# Patient Record
Sex: Male | Born: 1960
Health system: Southern US, Community
[De-identification: ages and names within clinical notes are randomized; demographics above are authoritative.]

## PROBLEM LIST (undated history)

## (undated) DIAGNOSIS — Z9889 Other specified postprocedural states: Secondary | ICD-10-CM

## (undated) DIAGNOSIS — N62 Hypertrophy of breast: Secondary | ICD-10-CM

## (undated) DIAGNOSIS — E229 Hyperfunction of pituitary gland, unspecified: Secondary | ICD-10-CM

## (undated) DIAGNOSIS — R112 Nausea with vomiting, unspecified: Secondary | ICD-10-CM

## (undated) DIAGNOSIS — E785 Hyperlipidemia, unspecified: Secondary | ICD-10-CM

## (undated) DIAGNOSIS — K219 Gastro-esophageal reflux disease without esophagitis: Secondary | ICD-10-CM

## (undated) DIAGNOSIS — E890 Postprocedural hypothyroidism: Secondary | ICD-10-CM

## (undated) DIAGNOSIS — F411 Generalized anxiety disorder: Secondary | ICD-10-CM

## (undated) DIAGNOSIS — I351 Nonrheumatic aortic (valve) insufficiency: Secondary | ICD-10-CM

## (undated) HISTORY — PX: INGUINAL HERNIA REPAIR: SHX194

## (undated) HISTORY — DX: Gastro-esophageal reflux disease without esophagitis: K21.9

## (undated) HISTORY — DX: Hypertrophy of breast: N62

## (undated) HISTORY — DX: Hyperfunction of pituitary gland, unspecified: E22.9

## (undated) HISTORY — PX: HEMORRHOID SURGERY: SHX153

## (undated) HISTORY — DX: Postprocedural hypothyroidism: E89.0

## (undated) HISTORY — DX: Generalized anxiety disorder: F41.1

## (undated) HISTORY — DX: Hyperlipidemia, unspecified: E78.5

---

## 2003-11-01 ENCOUNTER — Ambulatory Visit (HOSPITAL_COMMUNITY): Admission: RE | Admit: 2003-11-01 | Discharge: 2003-11-01 | Payer: Self-pay | Admitting: Internal Medicine

## 2004-05-02 ENCOUNTER — Ambulatory Visit: Payer: Self-pay | Admitting: Internal Medicine

## 2004-11-06 ENCOUNTER — Ambulatory Visit: Payer: Self-pay | Admitting: Internal Medicine

## 2006-03-25 ENCOUNTER — Ambulatory Visit: Payer: Self-pay | Admitting: Internal Medicine

## 2007-11-24 ENCOUNTER — Ambulatory Visit: Payer: Self-pay | Admitting: Internal Medicine

## 2007-11-24 DIAGNOSIS — K219 Gastro-esophageal reflux disease without esophagitis: Secondary | ICD-10-CM

## 2007-11-24 DIAGNOSIS — E785 Hyperlipidemia, unspecified: Secondary | ICD-10-CM | POA: Insufficient documentation

## 2007-11-24 DIAGNOSIS — F411 Generalized anxiety disorder: Secondary | ICD-10-CM | POA: Insufficient documentation

## 2007-11-24 DIAGNOSIS — H6120 Impacted cerumen, unspecified ear: Secondary | ICD-10-CM | POA: Insufficient documentation

## 2007-11-24 HISTORY — DX: Generalized anxiety disorder: F41.1

## 2007-11-24 HISTORY — DX: Gastro-esophageal reflux disease without esophagitis: K21.9

## 2007-11-24 HISTORY — DX: Hyperlipidemia, unspecified: E78.5

## 2008-07-19 ENCOUNTER — Ambulatory Visit: Payer: Self-pay | Admitting: Internal Medicine

## 2008-07-20 LAB — CONVERTED CEMR LAB
AST: 16 units/L (ref 0–37)
Albumin: 4.2 g/dL (ref 3.5–5.2)
Alkaline Phosphatase: 76 units/L (ref 39–117)
BUN: 20 mg/dL (ref 6–23)
Basophils Absolute: 0 10*3/uL (ref 0.0–0.1)
Bilirubin Urine: NEGATIVE
CO2: 25 meq/L (ref 19–32)
Calcium: 9.2 mg/dL (ref 8.4–10.5)
Creatinine, Ser: 0.8 mg/dL (ref 0.4–1.5)
Eosinophils Relative: 1.2 % (ref 0.0–5.0)
GFR calc Af Amer: 133 mL/min
HCT: 46.7 % (ref 39.0–52.0)
HDL: 34.5 mg/dL — ABNORMAL LOW (ref >39.0)
Ketones, ur: NEGATIVE mg/dL
LDL Cholesterol: 135 mg/dL — ABNORMAL HIGH (ref 0–99)
MCHC: 34.1 g/dL (ref 30.0–36.0)
MCV: 89.6 fL (ref 78.0–100.0)
Monocytes Absolute: 0.7 10*3/uL (ref 0.1–1.0)
Nitrite: NEGATIVE
PSA: 2.73 ng/mL (ref 0.10–4.00)
Platelets: 198 10*3/uL (ref 150–400)
Potassium: 4.3 meq/L (ref 3.5–5.1)
RBC: 5.21 M/uL (ref 4.22–5.81)
RDW: 12.7 % (ref 11.5–14.6)
Sodium: 140 meq/L (ref 135–145)
Specific Gravity, Urine: >=1.03 (ref 1.000–1.03)
TSH: 0.76 microintl units/mL (ref 0.35–5.50)
Total CHOL/HDL Ratio: 5.5
pH: 6 (ref 5.0–8.0)

## 2008-11-30 ENCOUNTER — Telehealth (INDEPENDENT_AMBULATORY_CARE_PROVIDER_SITE_OTHER): Payer: Self-pay | Admitting: *Deleted

## 2009-01-06 ENCOUNTER — Telehealth: Payer: Self-pay | Admitting: Internal Medicine

## 2009-03-22 ENCOUNTER — Telehealth: Payer: Self-pay | Admitting: Internal Medicine

## 2009-03-27 ENCOUNTER — Ambulatory Visit: Payer: Self-pay | Admitting: Internal Medicine

## 2009-11-10 ENCOUNTER — Ambulatory Visit: Payer: Self-pay | Admitting: Internal Medicine

## 2009-11-10 ENCOUNTER — Telehealth (INDEPENDENT_AMBULATORY_CARE_PROVIDER_SITE_OTHER): Payer: Self-pay | Admitting: *Deleted

## 2009-11-10 DIAGNOSIS — N62 Hypertrophy of breast: Secondary | ICD-10-CM | POA: Insufficient documentation

## 2009-11-10 HISTORY — DX: Hypertrophy of breast: N62

## 2009-11-10 LAB — CONVERTED CEMR LAB
Basophils Absolute: 0 10*3/uL (ref 0.0–0.1)
Bilirubin Urine: NEGATIVE
Bilirubin, Direct: 0.1 mg/dL (ref 0.0–0.3)
Cholesterol: 135 mg/dL (ref 0–200)
Creatinine, Ser: 0.5 mg/dL (ref 0.4–1.5)
Eosinophils Absolute: 0.1 10*3/uL (ref 0.0–0.7)
FSH: 33.5 milliintl units/mL — ABNORMAL HIGH (ref 1.4–18.1)
HCT: 44.4 % (ref 39.0–52.0)
HDL: 30.1 mg/dL — ABNORMAL LOW (ref >39.00)
LDL Cholesterol: 88 mg/dL (ref 0–99)
LH: 32.57 milliintl units/mL — ABNORMAL HIGH (ref 1.50–9.30)
Lymphocytes Relative: 11.1 % — ABNORMAL LOW (ref 12.0–46.0)
Lymphs Abs: 1.2 10*3/uL (ref 0.7–4.0)
MCV: 82.5 fL (ref 78.0–100.0)
Neutrophils Relative %: 80.6 % — ABNORMAL HIGH (ref 43.0–77.0)
Platelets: 214 10*3/uL (ref 150.0–400.0)
Potassium: 4.3 meq/L (ref 3.5–5.1)
Prolactin: 8.5 ng/mL
RBC: 5.38 M/uL (ref 4.22–5.81)
RDW: 13.5 % (ref 11.5–14.6)
Sodium: 141 meq/L (ref 135–145)
Specific Gravity, Urine: >=1.03 (ref 1.000–1.030)
TSH: 0.04 microintl units/mL — ABNORMAL LOW (ref 0.35–5.50)
Total Bilirubin: 0.6 mg/dL (ref 0.3–1.2)
Total CHOL/HDL Ratio: 4
Triglycerides: 87 mg/dL (ref 0.0–149.0)
Urine Glucose: NEGATIVE mg/dL
VLDL: 17.4 mg/dL (ref 0.0–40.0)
hCG, Beta Chain, Quant, S: 1.15 milliintl units/mL

## 2009-11-23 ENCOUNTER — Ambulatory Visit: Payer: Self-pay | Admitting: Endocrinology

## 2009-11-23 DIAGNOSIS — E229 Hyperfunction of pituitary gland, unspecified: Secondary | ICD-10-CM

## 2009-11-23 HISTORY — DX: Hyperfunction of pituitary gland, unspecified: E22.9

## 2009-11-23 LAB — CONVERTED CEMR LAB
Free T4: 2.13 ng/dL — ABNORMAL HIGH (ref 0.60–1.60)
TSH: 0.06 microintl units/mL — ABNORMAL LOW (ref 0.35–5.50)

## 2009-12-11 ENCOUNTER — Encounter (HOSPITAL_COMMUNITY): Admission: RE | Admit: 2009-12-11 | Discharge: 2010-03-01 | Payer: Self-pay | Admitting: Internal Medicine

## 2009-12-22 ENCOUNTER — Ambulatory Visit (HOSPITAL_COMMUNITY): Admission: RE | Admit: 2009-12-22 | Discharge: 2009-12-22 | Payer: Self-pay | Admitting: Endocrinology

## 2010-03-16 ENCOUNTER — Ambulatory Visit: Payer: Self-pay | Admitting: Endocrinology

## 2010-03-16 DIAGNOSIS — E039 Hypothyroidism, unspecified: Secondary | ICD-10-CM | POA: Insufficient documentation

## 2010-03-16 DIAGNOSIS — E890 Postprocedural hypothyroidism: Secondary | ICD-10-CM

## 2010-03-16 HISTORY — DX: Postprocedural hypothyroidism: E89.0

## 2010-03-16 LAB — CONVERTED CEMR LAB: Free T4: 0.11 ng/dL — ABNORMAL LOW (ref 0.60–1.60)

## 2010-03-19 ENCOUNTER — Telehealth: Payer: Self-pay | Admitting: Endocrinology

## 2010-05-10 ENCOUNTER — Ambulatory Visit: Payer: Self-pay | Admitting: Endocrinology

## 2010-06-24 ENCOUNTER — Encounter: Payer: Self-pay | Admitting: Internal Medicine

## 2010-07-03 NOTE — Assessment & Plan Note (Signed)
Summary: NEW ENDO PER FLAG/MP-GYNECOMASTIA-STC   Vital Signs:  Patient profile:   50 year old male Height:      64 inches (162.56 cm) Weight:      121 pounds (55.00 kg) O2 Sat:      95 % on Room air Temp:     97.9 degrees F (36.61 degrees C) oral Pulse rate:   117 / minute BP sitting:   120 / 78  (left arm) Cuff size:   regular  Vitals Entered By: Brenton Grills MA (November 23, 2009 8:17 AM)  O2 Flow:  Room air CC: new endo pt-gynecomastia/aj   CC:  new endo pt-gynecomastia/aj.  History of Present Illness: pt states few mos of slight pain at both breast areas, and associated swelling.  he says he has never been on hormonal medications or supplements.  he has no children, but he had normal fertilty evaluation some years ago.    Current Medications (verified): 1)  Sertraline Hcl 100 Mg Tabs (Sertraline Hcl) .... Take 2 Tablet By Mouth Once A Day 2)  Alprazolam 0.5 Mg Tabs (Alprazolam) .Marland Kitchen.. 1 By Mouth Two Times A Day As Needed  Allergies (verified): No Known Drug Allergies  Past History:  Past Medical History: Last updated: 11/24/2007 Anxiety GERD Hyperlipidemia  Family History: stroke COPD heart disease CVA neg fot pituitary probs.    Social History: Reviewed history from 11/10/2009 and no changes required. Current Smoker Alcohol use-yes Married no children work - Chemical engineer Drug use-no  Review of Systems       The patient complains of weight loss.         denies numbness, erectile dysfunction, decreased urinary stream, muscle weakness, fever, headache, easy bruising, sob, rash, blurry vision, chest pain, syncope, change in facial appearance, n/v, hoarseness, palpitations, diarrhea, myalgias, excessive diaphoresis, numbness, tremor, and hypoglycemia.  he has polyuria insomnia, tremor, rhinirrhea, and anxiety.  Physical Exam  General:  normal appearance.   Head:  head: no deformity eyes: no periorbital swelling, no proptosis external nose and ears  are normal mouth: no lesion seen no acromegalic features. Neck:  i cannot be certain if i can feel the top of a goiter.   Breasts:  there is slight bilateral gynecomastia. Lungs:  Clear to auscultation bilaterally. Normal respiratory effort.  Heart:  Regular rate and rhythm without murmurs or gallops noted. Normal S1,S2.   Abdomen:  abdomen is soft, nontender.  no hepatosplenomegaly.   not distended.  no hernia  Genitalia:  Normal external male genitalia with no urethral discharge.  Msk:  muscle bulk and strength are grossly normal.  no obvious joint swelling.  gait is normal and steady  Extremities:  no deformity no edema Neurologic:  cn 2-12 grossly intact.   readily moves all 4's.   sensation is intact to touch on all 4's there is a fine tremor of the hands Skin:  normal texture and temp.  no rash.  not diaphoretic  Cervical Nodes:  No significant adenopathy.  Psych:  Alert and cooperative; normal mood and affect; normal attention span and concentration.     Impression & Recommendations:  Problem # 1:  HYPERTHYROIDISM (ICD-242.90) Assessment New  Problem # 2:  ANTERIOR PITUITARY HYPERFUNCTION (ICD-253.1) uncertain etiology uncertain how or if this is related to #1  Problem # 3:  GYNECOMASTIA (ICD-611.1) prob related to #2  Medications Added to Medication List This Visit: 1)  Metoprolol Succinate 25 Mg Xr24h-tab (Metoprolol succinate) .Marland Kitchen.. 1 once daily  Other Orders:  Radiology Referral (Radiology) TLB-TSH (Thyroid Stimulating Hormone) (84443-TSH) TLB-T4 (Thyrox), Free [phone]) Consultation Level IV [phone])  Patient Instructions: 1)  we discussed the causes, risks, and treatment options of hyperthyroidism (overactive thyroid) 2)  recheck thyroid blood tests, and a "scan" (a special but easy type of thyroid x ray).  please call [phone] to hear your test results. 3)  plan will be to do radioactive iodine therapy, and return here approx 6 weeks later.  the iodine is  gone from your body in a few days, but takes several months to work. 4)  then the plan will be to recheck the other hormonal blood tests after your thyroid is better.   5)  add metoprolol-xr 25 mg once daily 6)  (update: i left message on phone-tree:  rx as we discussed) Prescriptions: METOPROLOL SUCCINATE 25 MG XR24H-TAB (METOPROLOL SUCCINATE) 1 once daily  #30 x 3   Entered and Authorized by:   Minus Breeding MD   Signed by:   Minus Breeding MD on 11/23/2009   Method used:   Electronically to        [facility]       [address]       [place], [place]  [address]       Ph: [phone]       Fax: [phone]   RxID:   [id number] METOPROLOL SUCCINATE 25 MG XR24H-TAB (METOPROLOL SUCCINATE) 1 once daily  #90 x 2   Entered and Authorized by:   Minus Breeding MD   Signed by:   Minus Breeding MD on 11/23/2009   Method used:   Faxed to ...       [facility])       [address] [address]       [place], [place]  [address]       Ph: [phone]       Fax: [phone]   RxID:   [id number]

## 2010-07-03 NOTE — Progress Notes (Signed)
Summary: Rx req  Phone Note Refill Request      Prescriptions: LEVOTHYROXINE SODIUM 100 MCG TABS (LEVOTHYROXINE SODIUM) 1 tab once daily  #30 x 2   Entered by:   Margaret Pyle, CMA   Authorized by:   Minus Breeding MD   Signed by:   Margaret Pyle, CMA on 03/19/2010   Method used:   Electronically to        [facility] ([facility])       [address]       [place], [place]  [address]       Ph: [phone]       Fax: [phone]   RxID:   [id number]

## 2010-07-03 NOTE — Progress Notes (Signed)
----   Converted from flag ---- ---- 11/10/2009 12:37 PM, Ivar Bury wrote: Gave pt/phone appt:  11/23/09 @ 815A w/Dr SAE  ---- 11/10/2009 8:53 AM, Dagoberto Reef wrote: Please schedule with Dr Everardo All.  Thanks  ---- 11/10/2009 8:49 AM, Corwin Levins MD wrote: The following orders have been entered for this patient and placed on Admin Hold:  Type:     Referral       Code:   Endocrine Description:   Endocrinology Referral Order Date:   11/10/2009   Authorized By:   Corwin Levins MD Order #:   [id number] Clinical Notes:   dr Everardo All ------------------------------

## 2010-07-03 NOTE — Assessment & Plan Note (Signed)
Summary: f/u appt/#/cd   Vital Signs:  Patient profile:   50 year old male Height:      64 inches (162.56 cm) Weight:      139.13 pounds (63.24 kg) BMI:     23.97 O2 Sat:      94 % on Room air Temp:     99.6 degrees F (37.56 degrees C) oral Pulse rate:   71 / minute BP sitting:   124 / 72  (left arm) Cuff size:   regular  Vitals Entered By: Brenton Grills MA (March 16, 2010 1:53 PM)  O2 Flow:  Room air CC: Follow-up visit/question about medication/aj Is Patient Diabetic? No   CC:  Follow-up visit/question about medication/aj.  History of Present Illness: pt is now almost 3 mos s/p i-131 rx for hyperthyroidism, due to grave's dz.  pt states he feels well in general.  Current Medications (verified): 1)  Sertraline Hcl 100 Mg Tabs (Sertraline Hcl) .... Take 2 Tablet By Mouth Once A Day 2)  Alprazolam 0.5 Mg Tabs (Alprazolam) .Marland Kitchen.. 1 By Mouth Two Times A Day As Needed 3)  Metoprolol Succinate 25 Mg Xr24h-Tab (Metoprolol Succinate) .Marland Kitchen.. 1 Once Daily  Allergies (verified): No Known Drug Allergies  Past History:  Past Medical History: Last updated: 11/24/2007 Anxiety GERD Hyperlipidemia  Social History: Reviewed history from 11/10/2009 and no changes required. Current Smoker Alcohol use-yes Married no children work - Psychologist, clinical Drug use-no  Review of Systems       he reports fatigue  Physical Exam  General:  normal appearance.   Head:  voice is deep and hoarse Neck:  i cannot be certain if i can feel the top of a goiter.   Additional Exam:  FastTSH              [H]  64.34 uIU/mL                0.35-5.50 Free T4              [L]  0.11 ng/dL             Impression & Recommendations:  Problem # 1:  HYPOTHYROIDISM, POST-RADIATION (ICD-244.1) Assessment New  Medications Added to Medication List This Visit: 1)  Levothyroxine Sodium 100 Mcg Tabs (Levothyroxine sodium) .Marland Kitchen.. 1 tab once daily  Other Orders: TLB-TSH (Thyroid Stimulating Hormone)  (84443-TSH) TLB-T4 (Thyrox), Free [phone]) Est. Patient Level III (84166)  Patient Instructions: 1)  stop metoprolol. 2)  blood tests are being ordered for you today.  please call [phone] to hear your test results. 3)  Please schedule a follow-up appointment in 1 month. 4)  (update: i left message on phone-tree:  start synthroid 100 micrograms/day). Prescriptions: LEVOTHYROXINE SODIUM 100 MCG TABS (LEVOTHYROXINE SODIUM) 1 tab once daily  #30 x 2   Entered and Authorized by:   Minus Breeding MD   Signed by:   Minus Breeding MD on 03/16/2010   Method used:   Electronically to        [facility])       [address]       [place], [place]  [address]       Ph: [phone]       Fax: [phone]   RxID:   [id number]

## 2010-07-03 NOTE — Assessment & Plan Note (Signed)
Summary: nipples sore and swollen-lb   Vital Signs:  Patient profile:   50 year old male Height:      64 inches Weight:      120.75 pounds BMI:     20.80 O2 Sat:      97 % on Room air Temp:     98.1 degrees F oral Pulse rate:   149 / minute BP sitting:   132 / 58  (left arm) Cuff size:   regular  Vitals Entered ByZella Ball Ewing (November 10, 2009 8:05 AM)  O2 Flow:  Room air  CC: Nipples swollen, sore to touch for 1 month, refills/RE   CC:  Nipples swollen, sore to touch for 1 month, and refills/RE.  History of Present Illness: here with above, as well as tissue swelling to bilat breast areas;  Pt denies CP, sob, doe, wheezing, orthopnea, pnd, worsening LE edema, palps, dizziness or syncope   Pt denies new neuro symptoms such as headache, facial or extremity weakness     Preventive Screening-Counseling & Management      Drug Use:  no.    Problems Prior to Update: 1)  Gynecomastia  (ICD-611.1) 2)  Preventive Health Care  (ICD-V70.0) 3)  Cerumen Impaction, Bilateral  (ICD-380.4) 4)  Hyperlipidemia  (ICD-272.4) 5)  Gerd  (ICD-530.81) 6)  Anxiety  (ICD-300.00)  Medications Prior to Update: 1)  Sertraline Hcl 100 Mg Tabs (Sertraline Hcl) .... Take 2 Tablet By Mouth Once A Day 2)  Alprazolam 0.5 Mg Tabs (Alprazolam) .Marland Kitchen.. 1 By Mouth Two Times A Day As Needed  Current Medications (verified): 1)  Sertraline Hcl 100 Mg Tabs (Sertraline Hcl) .... Take 2 Tablet By Mouth Once A Day 2)  Alprazolam 0.5 Mg Tabs (Alprazolam) .Marland Kitchen.. 1 By Mouth Two Times A Day As Needed  Allergies (verified): No Known Drug Allergies  Past History:  Past Medical History: Last updated: 11/24/2007 Anxiety GERD Hyperlipidemia  Past Surgical History: Last updated: 11/24/2007 Inguinal herniorrhaphy  Family History: Last updated: 11/24/2007 stroke COPD heart disease CVA  Social History: Last updated: 11/10/2009 Current Smoker Alcohol use-yes Married no children work - Chartered certified accountant Drug use-no  Risk Factors: Smoking Status: current (11/24/2007)  Family History: Reviewed history from 11/24/2007 and no changes required. stroke COPD heart disease CVA  Social History: Reviewed history from 11/24/2007 and no changes required. 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         all otherwise negative per pt -    Physical Exam  General:  alert and well-developed.   Head:  normocephalic and atraumatic.   Eyes:  vision grossly intact, pupils equal, and pupils round.   Ears:  R ear normal and L ear normal.   Nose:  no external deformity and no nasal discharge.   Mouth:  no gingival abnormalities and pharynx pink and moist.   Neck:  supple and no masses.   Breasts:  mild bilateral hypertrophy noted, mild tender, no masses, erythema or fluctucance, no nipple d/c Lungs:  normal respiratory effort and normal breath sounds.   Heart:  normal rate and  regular rhythm.   Abdomen:  soft, non-tender, and normal bowel sounds.   Msk:  no joint tenderness and no joint swelling.   Extremities:  no edema, no erythema  Neurologic:  cranial nerves II-XII intact, strength normal in all extremities, sensation intact to light touch, and DTRs symmetrical and normal.     Impression & Recommendations:  Problem # 1:  PREVENTIVE HEALTH CARE (ICD-V70.0) Overall doing well, age appropriate education and counseling updated and referral for  appropriate preventive services done unless declined, immunizations up to date or declined, diet counseling done if overweight, urged to quit smoking if smokes , most recent labs reviewed and current ordered if appropriate, ecg reviewed or declined (interpretation per ECG scanned in the EMR if done); information regarding Medicare Prevention requirements given if appropriate; speciality referrals updated as appropriate  Orders: TLB-BMP (Basic Metabolic Panel-BMET) (80048-METABOL) TLB-CBC Platelet - w/Differential (85025-CBCD) TLB-Hepatic/Liver Function Pnl (80076-HEPATIC) TLB-Lipid Panel (80061-LIPID) TLB-TSH (Thyroid Stimulating Hormone) (84443-TSH) TLB-PSA (Prostate Specific Antigen) (84153-PSA) TLB-Udip ONLY (81003-UDIP)  Problem # 2:  GYNECOMASTIA (ICD-611.1) for w/u today , and endo referral Orders: T-Estradiol (09811-91478) TLB-Prolactin (84146-PROL) TLB-FSH (Follicle Stimulating Hormone) (83001-FSH) TLB-Luteinizing Hormone (LH) (83002-LH) TLB-Testosterone, Total (84403-TESTO) TLB-Preg Serum Quant (B-hCG) (84702-HCG-QN) Endocrinology Referral (Endocrine)  Problem # 3:  ANXIETY (ICD-300.00)  His updated medication list for this problem includes:    Sertraline Hcl 100 Mg Tabs (Sertraline hcl) .Marland Kitchen... Take 2 tablet by mouth once a day    Alprazolam 0.5 Mg Tabs (Alprazolam) .Marland Kitchen... 1 by mouth two times a day as needed stable overall by hx and exam, ok to continue meds/tx as is   Complete Medication List: 1)  Sertraline Hcl 100 Mg Tabs (Sertraline hcl) .... Take 2 tablet by mouth once a day 2)  Alprazolam 0.5 Mg Tabs (Alprazolam) .Marland Kitchen.. 1 by mouth two times a day as needed  Patient Instructions: 1)  Continue all previous medications as before this visit 2)  Please go to the Lab in the basement for your blood and/or urine tests today 3)  You will be contacted about the referral(s) to: Dr Everardo All - endocrinology 4)  Please schedule a follow-up appointment in 1 year or sooner if  needed Prescriptions: ALPRAZOLAM 0.5 MG TABS (ALPRAZOLAM) 1 by mouth two times a day as needed  #60 x 2   Entered and Authorized by:   Corwin Levins MD   Signed by:   Corwin Levins MD on 11/10/2009   Method used:   Print then Give to Patient   RxID:   [id number] SERTRALINE HCL 100 MG TABS (SERTRALINE HCL) Take 2 tablet by mouth once a day  #180 x 3   Entered and Authorized by:   Corwin Levins MD   Signed by:   Corwin Levins MD on 11/10/2009   Method used:   Print then Give to Patient   RxID:   [id number]

## 2010-07-25 ENCOUNTER — Emergency Department (HOSPITAL_COMMUNITY)
Admission: EM | Admit: 2010-07-25 | Discharge: 2010-07-25 | Disposition: A | Discharge status: Home / Self Care | Payer: BC Managed Care – PPO | Attending: Emergency Medicine | Admitting: Emergency Medicine

## 2010-07-25 ENCOUNTER — Emergency Department (HOSPITAL_COMMUNITY): Payer: BC Managed Care – PPO

## 2010-07-25 DIAGNOSIS — F3289 Other specified depressive episodes: Secondary | ICD-10-CM | POA: Insufficient documentation

## 2010-07-25 DIAGNOSIS — R0602 Shortness of breath: Secondary | ICD-10-CM | POA: Insufficient documentation

## 2010-07-25 DIAGNOSIS — F329 Major depressive disorder, single episode, unspecified: Secondary | ICD-10-CM | POA: Insufficient documentation

## 2010-07-25 DIAGNOSIS — E039 Hypothyroidism, unspecified: Secondary | ICD-10-CM | POA: Insufficient documentation

## 2010-07-25 LAB — BASIC METABOLIC PANEL
BUN: 16 mg/dL (ref 6–23)
CO2: 23 mEq/L (ref 19–32)
Chloride: 104 mEq/L (ref 96–112)
Potassium: 4.2 mEq/L (ref 3.5–5.1)
Sodium: 138 mEq/L (ref 135–145)

## 2010-07-25 LAB — POCT CARDIAC MARKERS: CKMB, poc: 5 ng/mL (ref 1.0–8.0)

## 2010-07-25 LAB — DIFFERENTIAL
Basophils Relative: 0 % (ref 0–1)
Eosinophils Relative: 1 % (ref 0–5)
Lymphocytes Relative: 18 % (ref 12–46)
Lymphs Abs: 1.4 10*3/uL (ref 0.7–4.0)
Monocytes Absolute: 0.5 10*3/uL (ref 0.1–1.0)
Monocytes Relative: 6 % (ref 3–12)
Neutro Abs: 6.1 10*3/uL (ref 1.7–7.7)

## 2010-07-25 LAB — CBC
HCT: 44 % (ref 39.0–52.0)
MCH: 30.6 pg (ref 26.0–34.0)
MCHC: 34.5 g/dL (ref 30.0–36.0)
Platelets: 169 10*3/uL (ref 150–400)
RDW: 13.5 % (ref 11.5–15.5)
WBC: 8 10*3/uL (ref 4.0–10.5)

## 2010-07-25 IMAGING — CR DG CHEST 2V
2 series · 2 of 2 positions shown · non-contrast
Comparison: None.

CLINICAL DATA: 49-year-old male with shortness of breath.  History
of smoking and hyperthyroidism treated with nuclear medicine
therapy.

CHEST - 2 VIEW

[w chest pa]
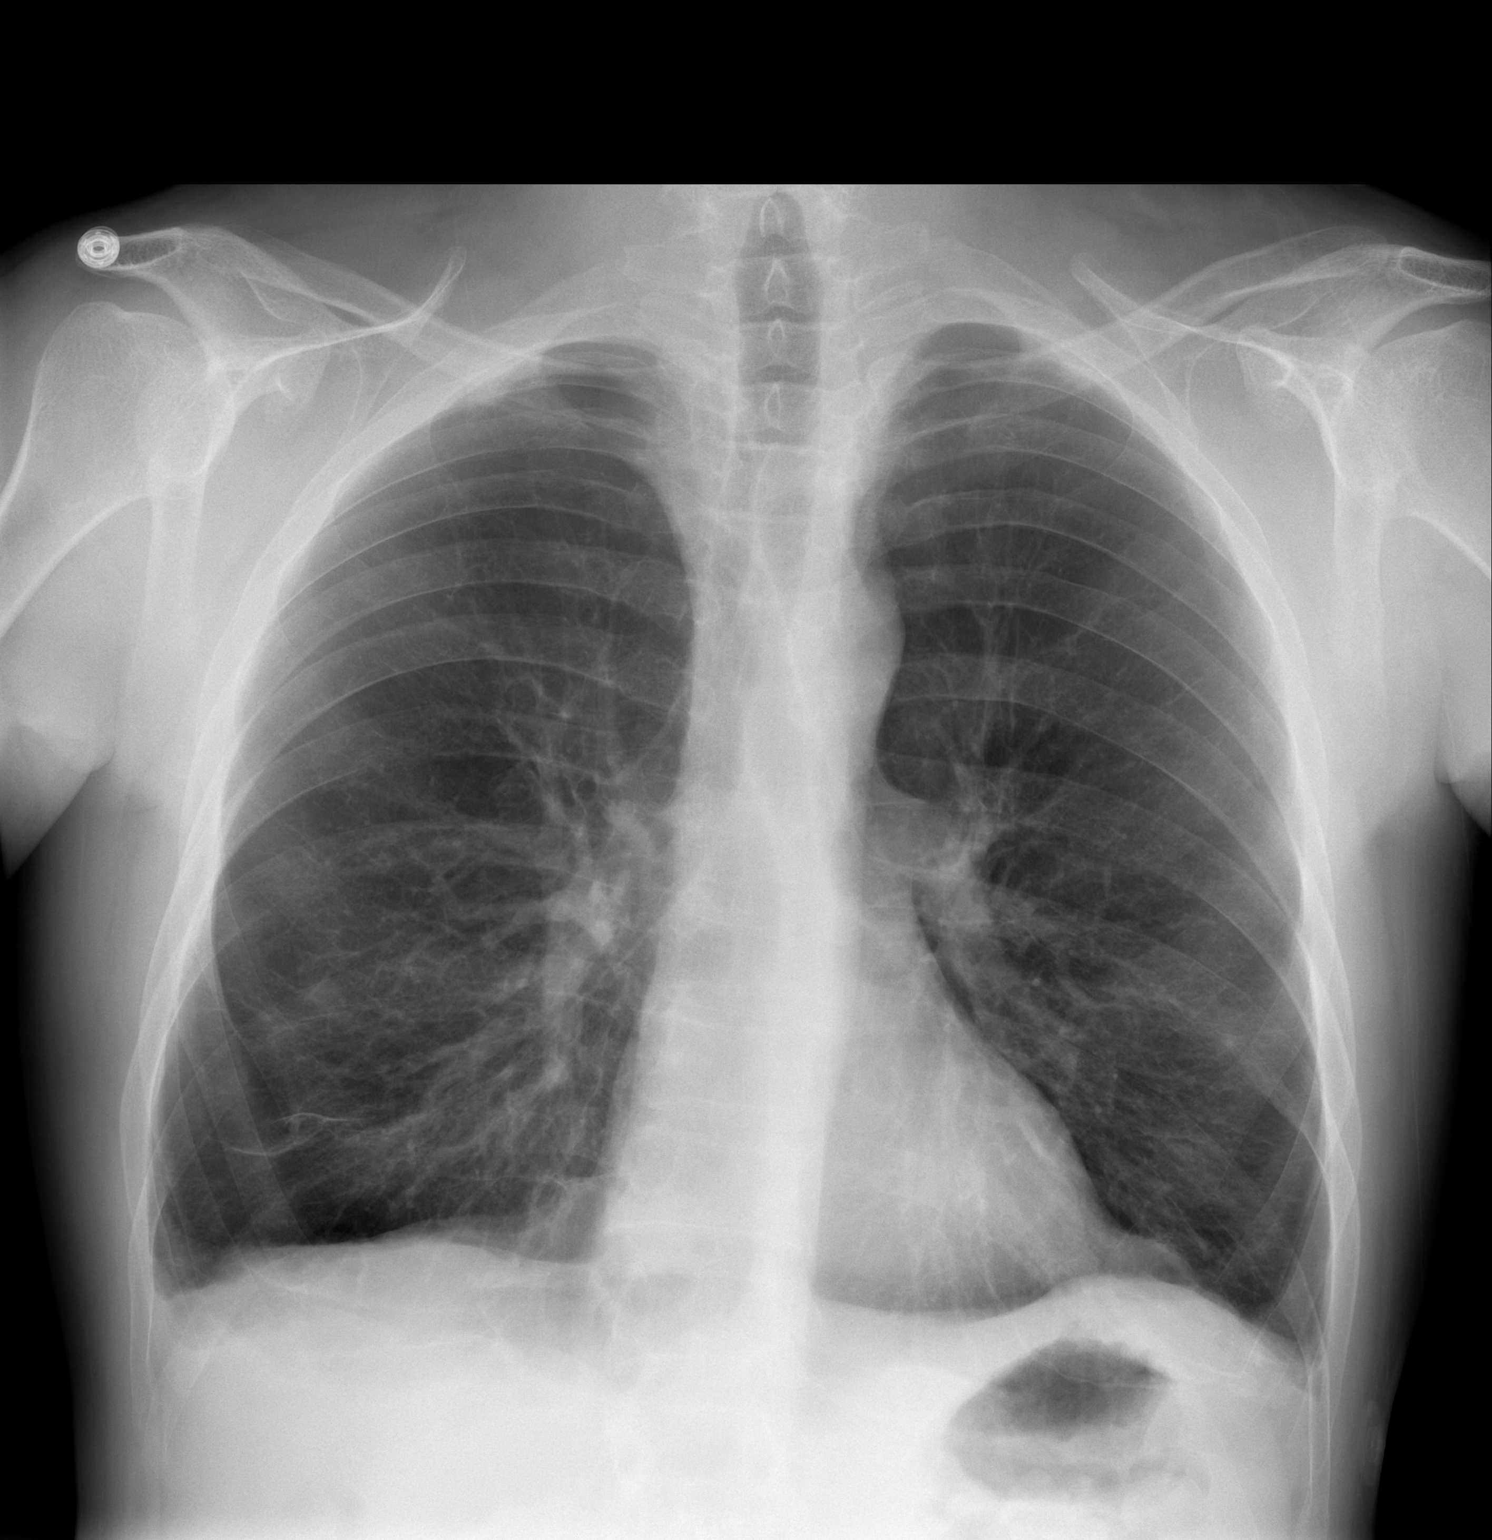

[w chest lat]
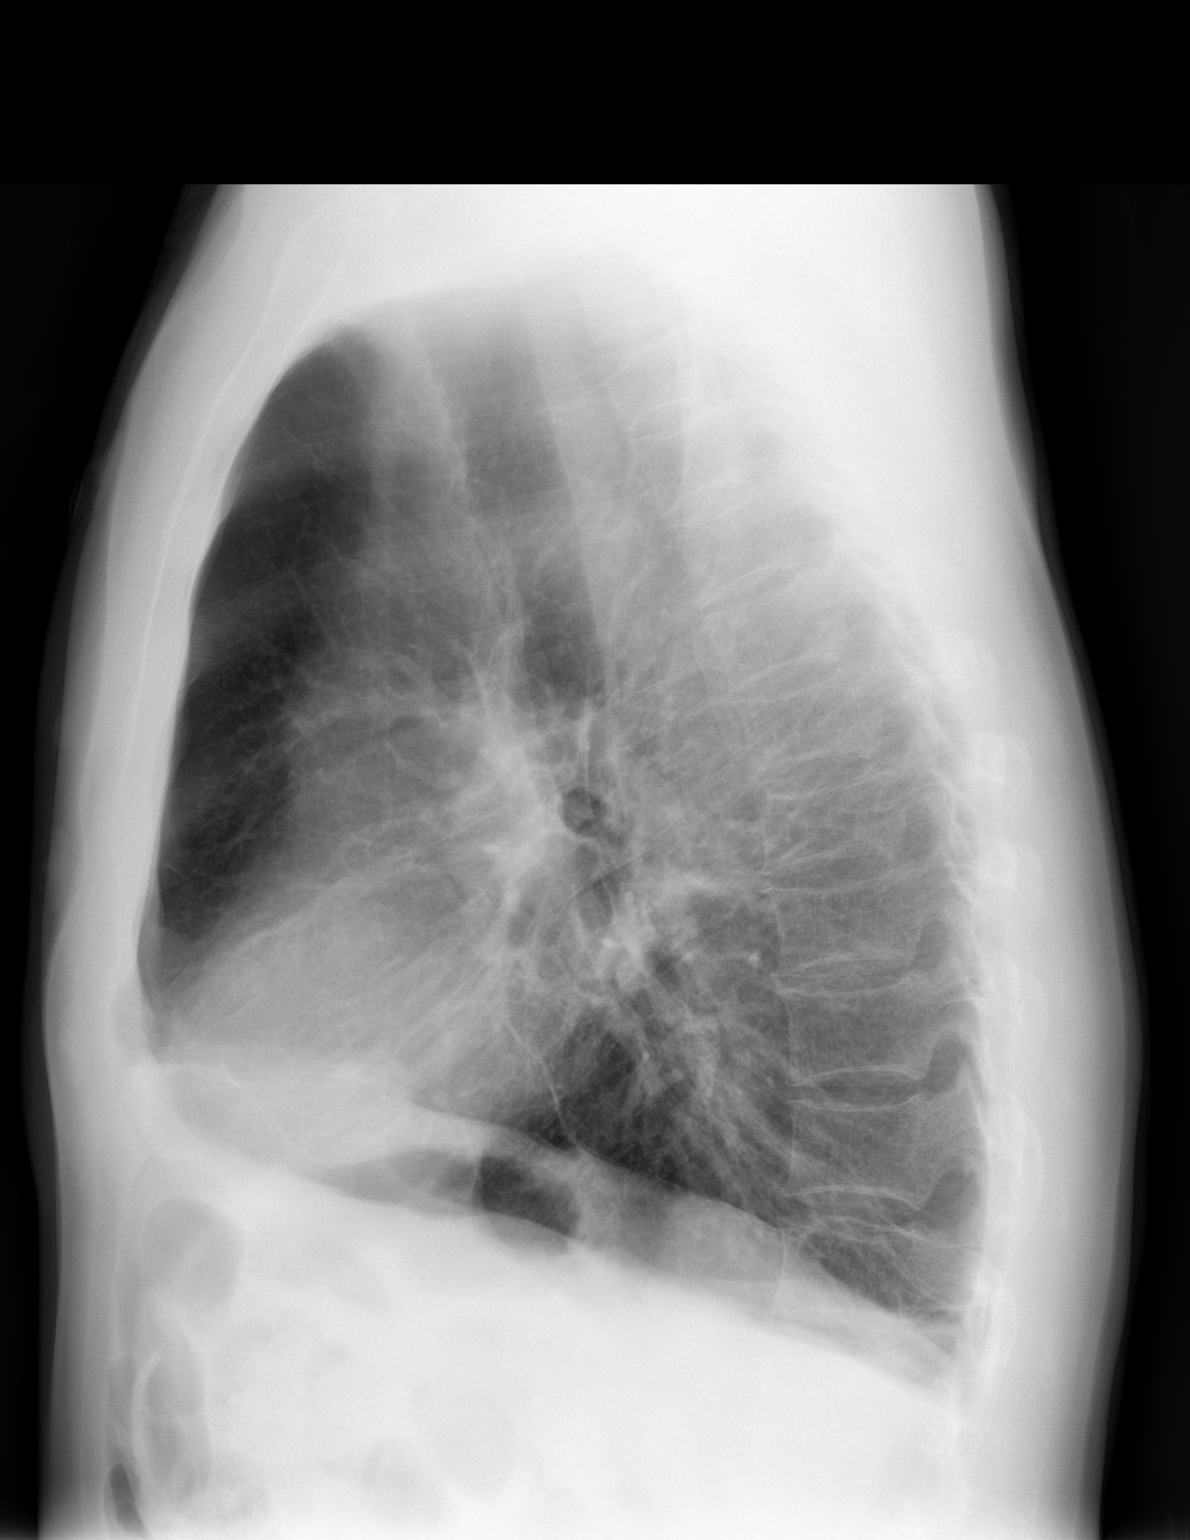

[2 of 2 positions shown; findings below may reference images not displayed]

FINDINGS: Large lung volumes.  Cardiac size and mediastinal
contours are within normal limits.  No pneumothorax, pulmonary
edema, pleural effusion or confluent pulmonary opacity.  Bibasilar
pleural scarring.  Mild mid thoracic vertebral compression
fractures appear chronic. No acute osseous abnormality identified.
IMPRESSION: Pulmonary hyperinflation. No acute cardiopulmonary abnormality.

## 2010-07-26 ENCOUNTER — Telehealth: Payer: Self-pay | Admitting: Internal Medicine

## 2010-07-31 NOTE — Progress Notes (Signed)
Summary: Rx refill req  Phone Note Refill Request Message from:  Patient on July 26, 2010 3:12 PM  Refills Requested: Medication #1:  SERTRALINE HCL 100 MG TABS Take 2 tablet by mouth once a day   Supply Requested: 3 months  Medication #2:  ALPRAZOLAM 0.5 MG TABS 1 by mouth two times a day as needed   Supply Requested: 3 months  Medication #3:  LEVOTHYROXINE SODIUM 100 MCG TABS 1 tab once daily.   Supply Requested: 3 months Pt is requesting a 90 day supply to [facility] pending CPX appt.   Method Requested: Fax to [facility] Initial call taken by: Margaret Pyle, CMA,  July 26, 2010 3:12 PM    New/Updated Medications: ALPRAZOLAM 0.5 MG TABS (ALPRAZOLAM) 1 by mouth two times a day as needed Prescriptions: LEVOTHYROXINE SODIUM 100 MCG TABS (LEVOTHYROXINE SODIUM) 1 tab once daily  #90 x 0   Entered by:   Scharlene Gloss CMA (AAMA)   Authorized by:   Corwin Levins MD   Signed by:   Scharlene Gloss CMA (AAMA) on 07/27/2010   Method used:   Faxed to ...       [facility])       [address] [address]       [place], [place]  [address]       Ph: [phone]       Fax: [phone]   RxID:   [id number] SERTRALINE HCL 100 MG TABS (SERTRALINE HCL) Take 2 tablet by mouth once a day  #180 x 0   Entered by:   Scharlene Gloss CMA (AAMA)   Authorized by:   Corwin Levins MD   Signed by:   Scharlene Gloss CMA (AAMA) on 07/27/2010   Method used:   Faxed to ...       [facility])       [address] [address]       [place], [place]  [address]       Ph: [phone]       Fax: [phone]   RxID:   [id number] ALPRAZOLAM 0.5 MG TABS (ALPRAZOLAM) 1 by mouth two times a day as needed  #60 x 2   Entered and Authorized by:   Corwin Levins MD   Signed by:   Corwin Levins MD on 07/26/2010   Method used:   Print then Give to Patient   RxID:   [id number]   "usual " rx done for alprazolam  - done hardcopy to [facility] side B - dahlia  90 day rx other to robin Corwin Levins  MD  July 26, 2010 6:10 PM   Appended Document: Rx refill req faxed hardcopy of [facility]ol[facility] to [facility] as requested

## 2010-08-23 ENCOUNTER — Encounter: Payer: Self-pay | Admitting: Endocrinology

## 2010-08-23 ENCOUNTER — Other Ambulatory Visit: Payer: BC Managed Care – PPO

## 2010-08-23 ENCOUNTER — Other Ambulatory Visit (INDEPENDENT_AMBULATORY_CARE_PROVIDER_SITE_OTHER): Payer: BC Managed Care – PPO

## 2010-08-23 ENCOUNTER — Ambulatory Visit (INDEPENDENT_AMBULATORY_CARE_PROVIDER_SITE_OTHER): Payer: BC Managed Care – PPO | Admitting: Endocrinology

## 2010-08-23 ENCOUNTER — Other Ambulatory Visit (INDEPENDENT_AMBULATORY_CARE_PROVIDER_SITE_OTHER): Payer: BC Managed Care – PPO | Admitting: Endocrinology

## 2010-08-23 VITALS — BP 122/66 | HR 81 | Temp 99.1°F | Ht 64.0 in | Wt 144.4 lb

## 2010-08-23 DIAGNOSIS — Z125 Encounter for screening for malignant neoplasm of prostate: Secondary | ICD-10-CM

## 2010-08-23 DIAGNOSIS — E229 Hyperfunction of pituitary gland, unspecified: Secondary | ICD-10-CM

## 2010-08-23 DIAGNOSIS — E890 Postprocedural hypothyroidism: Secondary | ICD-10-CM

## 2010-08-23 DIAGNOSIS — Z0000 Encounter for general adult medical examination without abnormal findings: Secondary | ICD-10-CM

## 2010-08-23 DIAGNOSIS — F411 Generalized anxiety disorder: Secondary | ICD-10-CM

## 2010-08-23 DIAGNOSIS — E785 Hyperlipidemia, unspecified: Secondary | ICD-10-CM

## 2010-08-23 LAB — URINALYSIS, ROUTINE W REFLEX MICROSCOPIC
Bilirubin Urine: NEGATIVE
Hgb urine dipstick: NEGATIVE
Ketones, ur: NEGATIVE
Specific Gravity, Urine: >=1.03 (ref 1.000–1.030)
Total Protein, Urine: NEGATIVE
Urine Glucose: NEGATIVE

## 2010-08-23 LAB — CBC WITH DIFFERENTIAL/PLATELET
Basophils Relative: 0.4 % (ref 0.0–3.0)
Eosinophils Relative: 0.6 % (ref 0.0–5.0)
MCHC: 34.8 g/dL (ref 30.0–36.0)
Monocytes Relative: 8 % (ref 3.0–12.0)
Neutrophils Relative %: 70.4 % (ref 43.0–77.0)
Platelets: 196 10*3/uL (ref 150.0–400.0)
RBC: 4.29 Mil/uL (ref 4.22–5.81)
RDW: 14.1 % (ref 11.5–14.6)
WBC: 6.7 10*3/uL (ref 4.5–10.5)

## 2010-08-23 LAB — LIPID PANEL
Cholesterol: 184 mg/dL (ref 0–200)
HDL: 41.6 mg/dL (ref >39.00)
LDL Cholesterol: 129 mg/dL — ABNORMAL HIGH (ref 0–99)
Total CHOL/HDL Ratio: 4
Triglycerides: 69 mg/dL (ref 0.0–149.0)
VLDL: 13.8 mg/dL (ref 0.0–40.0)

## 2010-08-23 LAB — BASIC METABOLIC PANEL: Chloride: 103 mEq/L (ref 96–112)

## 2010-08-23 LAB — HEPATIC FUNCTION PANEL
ALT: 13 U/L (ref 0–53)
Bilirubin, Direct: 0.1 mg/dL (ref 0.0–0.3)

## 2010-08-23 LAB — FOLLICLE STIMULATING HORMONE: FSH: 14.1 m[IU]/mL (ref 1.4–18.1)

## 2010-08-23 LAB — TSH: TSH: 22.8 u[IU]/mL — ABNORMAL HIGH (ref 0.35–5.50)

## 2010-08-23 LAB — LUTEINIZING HORMONE: LH: 5.15 m[IU]/mL (ref 1.50–9.30)

## 2010-08-23 MED ORDER — LEVOTHYROXINE SODIUM 150 MCG PO TABS: 150.0000 ug | ORAL_TABLET | Freq: Every day | ORAL | Status: DC

## 2010-08-23 NOTE — Progress Notes (Signed)
  Subjective:    Patient ID: Christopher Arias, male    DOB: [birth year], 50 y.o.   MRN: [id number]  HPI The state of 3 ongoing probs is noted today: Hypothyroidism: pt is now 8 mos s/p i-131 rx for hyperthyroidism, due to grave's dz.  pt states he feels well in general, except for fatigue.   He was noted in the past to have elev testosterone. He had no sxs of this. Dyslipidemia:  He is not on sny rx for this. Past Medical History  Diagnosis Date  . HYPOTHYROIDISM, POST-RADIATION 03/16/2010  . ANTERIOR PITUITARY HYPERFUNCTION 11/23/2009  . HYPERLIPIDEMIA 11/24/2007  . ANXIETY 11/24/2007  . GERD 11/24/2007  . GYNECOMASTIA 11/10/2009   Past Surgical History  Procedure Date  . Inguinal hernia repair     reports that he has been smoking.  He does not have any smokeless tobacco history on file. He reports that he drinks alcohol. He reports that he does not use illicit drugs. family history includes COPD in his other; Heart disease in his other; and Stroke in his other. Allergies not on file  Review of Systems He has gained weight, but he says he was wanting to gain weight since last year.  The mastalgia he had last year has resolved.     Objective:   Physical Exam Gen:  no distress Neck:  Thyroid is non-palpable.   Skin:  Normal texture and temp.    Lab Results  Component Value Date   TSH 22.80* 08/23/2010   Lab Results  Component Value Date   TESTOSTERONE 618.13 08/23/2010       Assessment & Plan:  Post-1-131 hypothyroidism, needs increased rx elev testosterone, uncertain etiology.  Has normalized now. Dyslipidemia, could affected by hypothyroidism

## 2010-08-23 NOTE — Patient Instructions (Addendum)
blood tests are being ordered for you today.  please call [phone] to hear your test results. Please schedule a regular physical appointment with dr Jonny Ruiz. (update: i left message on phone-tree:  Testosterone has normalized.  Increase synthroid to 150 mcg/d.  Recheck tsh in 1 month.  This may help chol also.)

## 2010-08-28 ENCOUNTER — Other Ambulatory Visit: Payer: Self-pay | Admitting: Endocrinology

## 2010-08-28 DIAGNOSIS — E890 Postprocedural hypothyroidism: Secondary | ICD-10-CM

## 2010-11-13 ENCOUNTER — Ambulatory Visit (INDEPENDENT_AMBULATORY_CARE_PROVIDER_SITE_OTHER): Payer: BC Managed Care – PPO | Admitting: Internal Medicine

## 2010-11-13 ENCOUNTER — Encounter: Payer: Self-pay | Admitting: Internal Medicine

## 2010-11-13 ENCOUNTER — Other Ambulatory Visit (INDEPENDENT_AMBULATORY_CARE_PROVIDER_SITE_OTHER): Payer: BC Managed Care – PPO

## 2010-11-13 VITALS — BP 112/60 | HR 101 | Temp 98.6°F | Ht 64.0 in | Wt 148.0 lb

## 2010-11-13 DIAGNOSIS — Z0001 Encounter for general adult medical examination with abnormal findings: Secondary | ICD-10-CM | POA: Insufficient documentation

## 2010-11-13 DIAGNOSIS — E890 Postprocedural hypothyroidism: Secondary | ICD-10-CM

## 2010-11-13 DIAGNOSIS — Z0000 Encounter for general adult medical examination without abnormal findings: Secondary | ICD-10-CM

## 2010-11-13 LAB — TSH: TSH: 13.23 u[IU]/mL — ABNORMAL HIGH (ref 0.35–5.50)

## 2010-11-13 MED ORDER — SERTRALINE HCL 100 MG PO TABS: ORAL_TABLET | ORAL | Status: DC

## 2010-11-13 MED ORDER — ALPRAZOLAM 0.5 MG PO TABS: 0.5000 mg | ORAL_TABLET | Freq: Two times a day (BID) | ORAL | Status: DC | PRN

## 2010-11-13 MED ORDER — LEVOTHYROXINE SODIUM 88 MCG PO TABS: ORAL_TABLET | ORAL | Status: DC

## 2010-11-13 NOTE — Progress Notes (Signed)
Addended by: Corwin Levins on: 11/13/2010 08:29 PM   Modules accepted: Orders

## 2010-11-13 NOTE — Assessment & Plan Note (Signed)
Overall doing well, age appropriate education and counseling updated, referrals for preventative services and immunizations addressed, dietary and smoking counseling addressed, most recent labs and ECG reviewed.  I have personally reviewed and have noted: 1) the patient's medical and social history 2) The pt's use of alcohol, tobacco, and illicit drugs 3) The patient's current medications and supplements 4) Functional ability including ADL's, fall risk, home safety risk, hearing and visual impairment 5) Diet and physical activities 6) Evidence for depression or mood disorder 7) The patient's height, weight, and BMI have been recorded in the chart I have made referrals, and provided counseling and education based on review of the above Lab Results  Component Value Date   WBC 6.7 08/23/2010   HGB 13.6 08/23/2010   HCT 39.1 08/23/2010   PLT 196.0 08/23/2010   CHOL 184 08/23/2010   TRIG 69.0 08/23/2010   HDL 41.60 08/23/2010   ALT 13 08/23/2010   AST 14 08/23/2010   NA 135 08/23/2010   K 4.6 08/23/2010   CL 103 08/23/2010   CREATININE 0.8 08/23/2010   BUN 19 08/23/2010   CO2 25 08/23/2010   TSH 13.23* 11/13/2010   PSA 2.05 08/23/2010

## 2010-11-13 NOTE — Patient Instructions (Signed)
Continue all other medications as before Please go to LAB in the Basement for the blood and/or urine tests to be done today Please call the phone number [phone] (the PhoneTree System) for results of testing in 2-3 days;  When calling, simply dial the number, and when prompted enter the MRN number above (the Medical Record Number) and the # key, then the message should start. Please return in 1 year for your yearly visit, or sooner if needed, with Lab testing done 3-5 days before  

## 2010-11-13 NOTE — Progress Notes (Signed)
Subjective:    Patient ID: Christopher Arias, male    DOB: [birth year], 50 y.o.   MRN: [id number]  HPI Here for wellness and f/u;  Overall doing ok;  Pt denies CP, worsening SOB, DOE, wheezing, orthopnea, PND, worsening LE edema, palpitations, dizziness or syncope.  Pt denies neurological change such as new Headache, facial or extremity weakness.  Pt denies polydipsia, polyuria, or low sugar symptoms. Pt states overall good compliance with treatment and medications, good tolerability, and trying to follow lower cholesterol diet.  Pt denies worsening depressive symptoms, suicidal ideation or panic. No fever, wt loss, night sweats, loss of appetite, or other constitutional symptoms.  Pt states good ability with ADL's, low fall risk, home safety reviewed and adequate, no significant changes in hearing or vision, and occasionally active with exercise.  Overall anxiety controlled on current meds. No new complaints Past Medical History  Diagnosis Date  . HYPOTHYROIDISM, POST-RADIATION 03/16/2010  . ANTERIOR PITUITARY HYPERFUNCTION 11/23/2009  . HYPERLIPIDEMIA 11/24/2007  . ANXIETY 11/24/2007  . GERD 11/24/2007  . GYNECOMASTIA 11/10/2009   Past Surgical History  Procedure Date  . Inguinal hernia repair     reports that he has been smoking.  He does not have any smokeless tobacco history on file. He reports that he drinks alcohol. He reports that he does not use illicit drugs. family history includes COPD in his other; Heart disease in his other; and Stroke in his other. No Known Allergies Current Outpatient Prescriptions on File Prior to Visit  Medication Sig Dispense Refill  . levothyroxine (SYNTHROID) 150 MCG tablet Take 1 tablet (150 mcg total) by mouth daily.  90 tablet  3  . DISCONTD: ALPRAZolam (XANAX) 0.5 MG tablet Take 0.5 mg by mouth 2 (two) times daily as needed.        Marland Kitchen DISCONTD: sertraline (ZOLOFT) 100 MG tablet Take 2 tablets by mouth once a day       . DISCONTD: levothyroxine (SYNTHROID,  LEVOTHROID) 100 MCG tablet TAKE ONE TABLET BY MOUTH ONE TIME DAILY  30 tablet  3   Review of Systems Review of Systems  Constitutional: Negative for diaphoresis, activity change, appetite change and unexpected weight change.  HENT: Negative for hearing loss, ear pain, facial swelling, mouth sores and neck stiffness.   Eyes: Negative for pain, redness and visual disturbance.  Respiratory: Negative for shortness of breath and wheezing.   Cardiovascular: Negative for chest pain and palpitations.  Gastrointestinal: Negative for diarrhea, blood in stool, abdominal distention and rectal pain.  Genitourinary: Negative for hematuria, flank pain and decreased urine volume.  Musculoskeletal: Negative for myalgias and joint swelling.  Skin: Negative for color change and wound.  Neurological: Negative for syncope and numbness.  Hematological: Negative for adenopathy.  Psychiatric/Behavioral: Negative for hallucinations, self-injury, decreased concentration and agitation.      Objective:   Physical Exam BP 112/60  Pulse 101  Temp(Src) 98.6 F (37 C) (Oral)  Ht 5\' 4"  (1.626 m)  Wt 148 lb (67.132 kg)  BMI 25.40 kg/m2  SpO2 95% Physical Exam  VS noted Constitutional: Pt is oriented to person, place, and time. Appears well-developed and well-nourished.  HENT:  Head: Normocephalic and atraumatic.  Right Ear: External ear normal.  Left Ear: External ear normal.  Nose: Nose normal.  Mouth/Throat: Oropharynx is clear and moist.  Eyes: Conjunctivae and EOM are normal. Pupils are equal, round, and reactive to light.  Neck: Normal range of motion. Neck supple. No JVD present. No tracheal deviation present.  Cardiovascular: Normal rate, regular rhythm, normal heart sounds and intact distal pulses.   Pulmonary/Chest: Effort normal and breath sounds normal.  Abdominal: Soft. Bowel sounds are normal. There is no tenderness.  Musculoskeletal: Normal range of motion. Exhibits no edema.  Lymphadenopathy:   Has no cervical adenopathy.  Neurological: Pt is alert and oriented to person, place, and time. Pt has normal reflexes. No cranial nerve deficit.  Skin: Skin is warm and dry. No rash noted.  Psychiatric:  Has  normal mood and affect. Behavior is normal.         Assessment & Plan:

## 2010-11-13 NOTE — Assessment & Plan Note (Signed)
Due for f/u tsh after last med change march 2012   Lab Results  Component Value Date   TSH 13.23* 11/13/2010

## 2010-11-14 ENCOUNTER — Telehealth: Payer: Self-pay

## 2010-11-14 NOTE — Telephone Encounter (Signed)
Informed patient of medication change.

## 2010-11-14 NOTE — Telephone Encounter (Signed)
Message copied by Pincus Sanes on Wed Nov 14, 2010 10:28 AM ------      Message from: Corwin Levins      Created: Tue Nov 13, 2010  8:29 PM      Regarding: thyroid med       Med was actually changed to 88 mcg - TWO pills per day (which ends up being total 176 mcg)

## 2010-12-12 ENCOUNTER — Other Ambulatory Visit: Payer: BC Managed Care – PPO

## 2010-12-19 ENCOUNTER — Ambulatory Visit (INDEPENDENT_AMBULATORY_CARE_PROVIDER_SITE_OTHER): Payer: BC Managed Care – PPO | Admitting: Internal Medicine

## 2010-12-19 ENCOUNTER — Encounter: Payer: Self-pay | Admitting: Internal Medicine

## 2010-12-19 VITALS — BP 130/60 | HR 117 | Temp 98.9°F | Ht 63.0 in | Wt 146.2 lb

## 2010-12-19 DIAGNOSIS — L732 Hidradenitis suppurativa: Secondary | ICD-10-CM

## 2010-12-19 MED ORDER — DOXYCYCLINE HYCLATE 100 MG PO TABS: 100.0000 mg | ORAL_TABLET | Freq: Two times a day (BID) | ORAL | Status: AC

## 2010-12-19 NOTE — Patient Instructions (Signed)
Take all new medications as prescribed Continue all other medications as before  

## 2010-12-19 NOTE — Progress Notes (Signed)
  Subjective:    Patient ID: Christopher Arias, male    DOB: 1960/10/12, 50 y.o.   MRN: [id number]  HPI here with acute onset right axillary knot x 2-3 days, tender without other knots to neck or left axilla;  No prior hx,  No fever, chills, ST, cough, and Pt denies chest pain, increased sob or doe, wheezing, orthopnea, PND, increased LE swelling, palpitations, dizziness or syncope.  Pt denies new neurological symptoms such as new headache, or facial or extremity weakness or numbness   Pt denies polydipsia, polyuria,   Pt denies fever, wt loss, night sweats, loss of appetite, or other constitutional symptoms  Past Medical History  Diagnosis Date  . HYPOTHYROIDISM, POST-RADIATION 03/16/2010  . ANTERIOR PITUITARY HYPERFUNCTION 11/23/2009  . HYPERLIPIDEMIA 11/24/2007  . ANXIETY 11/24/2007  . GERD 11/24/2007  . GYNECOMASTIA 11/10/2009   Past Surgical History  Procedure Date  . Inguinal hernia repair     reports that he has been smoking.  He does not have any smokeless tobacco history on file. He reports that he drinks alcohol. He reports that he does not use illicit drugs. family history includes COPD in his other; Heart disease in his other; and Stroke in his other. No Known Allergies Current Outpatient Prescriptions on File Prior to Visit  Medication Sig Dispense Refill  . ALPRAZolam (XANAX) 0.5 MG tablet Take 1 tablet (0.5 mg total) by mouth 2 (two) times daily as needed.  60 tablet  2  . levothyroxine (SYNTHROID) 88 MCG tablet 2 tabs by mouth per day  60 tablet  11  . sertraline (ZOLOFT) 100 MG tablet Take 2 tablets by mouth once a day  180 tablet  0    Review of Systems Review of Systems  Constitutional: Negative for diaphoresis and unexpected weight change.  HENT: Negative for drooling and tinnitus.   Eyes: Negative for photophobia and visual disturbance.  Respiratory: Negative for choking and stridor.         Objective:   Physical Exam BP 130/60  Pulse 117  Temp(Src) 98.9 F (37.2  C) (Oral)  Ht 5\' 3"  (1.6 m)  Wt 146 lb 4 oz (66.339 kg)  BMI 25.91 kg/m2  SpO2 96% Physical Exam  VS noted Constitutional: Pt appears well-developed and well-nourished.  HENT: Head: Normocephalic.  Right Ear: External ear normal.  Left Ear: External ear normal.  Eyes: Conjunctivae and EOM are normal. Pupils are equal, round, and reactive to light.  Neck: Normal range of motion. Neck supple.  Cardiovascular: Normal rate and regular rhythm.   Pulmonary/Chest: Effort normal and breath sounds normal.  Abd:  Soft, NT, non-distended, + BS Neurological: Pt is alert. No cranial nerve deficit.  Skin: Skin is warm. No erythema. Right axilla with 1 cm tender nodule, without fluctuance or drainage, no other LA noted, left axilla and neck without nodules or LA Psychiatric: Pt behavior is normal. Thought content normal.         Assessment & Plan:

## 2010-12-19 NOTE — Assessment & Plan Note (Signed)
Mild to mod, for antibx course,  to f/u any worsening symptoms or concerns 

## 2011-02-11 ENCOUNTER — Other Ambulatory Visit: Payer: Self-pay | Admitting: *Deleted

## 2011-02-11 MED ORDER — LEVOTHYROXINE SODIUM 88 MCG PO TABS: ORAL_TABLET | ORAL | Status: DC

## 2011-02-11 NOTE — Telephone Encounter (Signed)
Pt's wife requesting refill of Levothyroxine be sent to [facility] and a temporary rx be sent to [facility] on [address]. Pt is currently out of medication. Pt and spouse informed via VM, left message to callback office with any questions/concerns

## 2011-04-30 ENCOUNTER — Other Ambulatory Visit: Payer: Self-pay

## 2011-04-30 MED ORDER — ALPRAZOLAM 0.5 MG PO TABS: 0.5000 mg | ORAL_TABLET | Freq: Two times a day (BID) | ORAL | Status: DC | PRN

## 2011-04-30 NOTE — Telephone Encounter (Signed)
Done hardcopy to robin  

## 2011-05-01 NOTE — Telephone Encounter (Signed)
Faxed hardcopy to pharmacy. 

## 2011-07-26 ENCOUNTER — Other Ambulatory Visit: Payer: Self-pay

## 2011-07-26 MED ORDER — ALPRAZOLAM 0.5 MG PO TABS: 0.5000 mg | ORAL_TABLET | Freq: Two times a day (BID) | ORAL | Status: DC | PRN

## 2011-07-26 NOTE — Telephone Encounter (Signed)
Done hardcopy to robin  

## 2011-07-26 NOTE — Telephone Encounter (Signed)
Faxed hardcopy to pharmacy. 

## 2011-08-11 ENCOUNTER — Other Ambulatory Visit: Payer: Self-pay | Admitting: Internal Medicine

## 2011-09-02 ENCOUNTER — Other Ambulatory Visit: Payer: Self-pay

## 2011-09-02 NOTE — Telephone Encounter (Signed)
alpaz refill too soon - should have one refill left

## 2011-09-02 NOTE — Telephone Encounter (Signed)
Pharmacy informed.

## 2011-11-20 ENCOUNTER — Other Ambulatory Visit: Payer: Self-pay | Admitting: Internal Medicine

## 2011-12-26 ENCOUNTER — Other Ambulatory Visit: Payer: Self-pay

## 2011-12-26 MED ORDER — ALPRAZOLAM 0.5 MG PO TABS: 0.5000 mg | ORAL_TABLET | Freq: Two times a day (BID) | ORAL | Status: DC | PRN

## 2011-12-26 NOTE — Telephone Encounter (Signed)
Faxed hardcopy to pharmacy. 

## 2011-12-26 NOTE — Telephone Encounter (Signed)
Done hardcopy to robin  

## 2012-01-26 ENCOUNTER — Other Ambulatory Visit: Payer: Self-pay | Admitting: Internal Medicine

## 2012-02-10 ENCOUNTER — Telehealth: Payer: Self-pay | Admitting: Internal Medicine

## 2012-02-10 ENCOUNTER — Other Ambulatory Visit: Payer: Self-pay

## 2012-02-10 MED ORDER — SYNTHROID 88 MCG PO TABS: 88.0000 ug | ORAL_TABLET | Freq: Every day | ORAL | Status: DC

## 2012-02-10 NOTE — Telephone Encounter (Signed)
Caller: Michele/Grandparent; Patient Name: Christopher Arias; PCP: Oliver Barre (Adults only); Best Callback Phone Number: [phone]; Reason for call: Upon callback, states that medication refill she was requesting has been taken care of.

## 2012-02-25 ENCOUNTER — Other Ambulatory Visit: Payer: Self-pay | Admitting: Internal Medicine

## 2012-03-10 ENCOUNTER — Telehealth: Payer: Self-pay | Admitting: Internal Medicine

## 2012-03-10 ENCOUNTER — Other Ambulatory Visit: Payer: Self-pay | Admitting: Internal Medicine

## 2012-03-10 NOTE — Telephone Encounter (Signed)
Caller: Michelle/Spouse; Phone: [phone]; Reason for Call: Caller: Michelle/Spouse; Patient Name: Christopher Arias; PCP: Oliver Barre (Adults only); Best Callback Phone Number: [phone]  Spouse states patient developed nasal congestion/drainage and cough.  Onset 03/07/12.  Afebrile.  States patient has been taking Nyquil and OTC Multisymptom cold medication with some relief.  Patient taking fluids well.  Denies sore throat.  RN spoke with patient.  Patient states chest feels tight with breathing.  Spouse states intermittent, mild wheezing noted.  Triage per Breathing Problems Protocol.  No emergent symptoms identified.  Care advice given per guidelines related to positive triage assessment for " New or increasing production of yellow, green or brown sputum.  " Patient advised saline nasal washes/netty pot, increased fluids, warm fluids with honey, inhaled steam, humidifier.  Call back parameters reviewed.  Patient verbalizes understanding, DisposItion obtained of " See Provider within 24 hours.  " No appointments available, in Epic Electronic Health Record for 03/10/12.  Appointment scheduled for 03/11/12 1100 with Dr.  Oliver Barre.  Patient advised to return call sooner if symptoms increase.  Patient verbalizes understanding.   SPOUSE STATES REFILL FOR SERTRALINE 100MG .  ; 2 TABLETS DAILY, 30 DAY SUPPLY, [facility]  SPOUSE STATES INSURANCE REQUIRES THAT PATIENT RECEIVE A 90 SUPPLY OF SERTRALINE.  [facility] USES [facility] [address] AT [phone].  PATIENT CAN BE REACHED AT [phone].

## 2012-03-11 ENCOUNTER — Ambulatory Visit (INDEPENDENT_AMBULATORY_CARE_PROVIDER_SITE_OTHER): Payer: BC Managed Care – PPO | Admitting: Internal Medicine

## 2012-03-11 ENCOUNTER — Encounter: Payer: Self-pay | Admitting: Internal Medicine

## 2012-03-11 VITALS — BP 138/70 | HR 87 | Temp 97.8°F | Ht 63.0 in | Wt 155.2 lb

## 2012-03-11 DIAGNOSIS — F17200 Nicotine dependence, unspecified, uncomplicated: Secondary | ICD-10-CM

## 2012-03-11 DIAGNOSIS — J209 Acute bronchitis, unspecified: Secondary | ICD-10-CM

## 2012-03-11 DIAGNOSIS — Z0000 Encounter for general adult medical examination without abnormal findings: Secondary | ICD-10-CM

## 2012-03-11 MED ORDER — ASPIRIN 81 MG PO TBEC: 81.0000 mg | DELAYED_RELEASE_TABLET | Freq: Every day | ORAL | Status: DC

## 2012-03-11 MED ORDER — SERTRALINE HCL 100 MG PO TABS: ORAL_TABLET | ORAL | Status: DC

## 2012-03-11 MED ORDER — AZITHROMYCIN 250 MG PO TABS: ORAL_TABLET | ORAL | Status: DC

## 2012-03-11 MED ORDER — SYNTHROID 88 MCG PO TABS: ORAL_TABLET | ORAL | Status: DC

## 2012-03-11 MED ORDER — ALPRAZOLAM 0.5 MG PO TABS: 0.5000 mg | ORAL_TABLET | Freq: Two times a day (BID) | ORAL | Status: DC | PRN

## 2012-03-11 MED ORDER — HYDROCODONE-HOMATROPINE 5-1.5 MG/5ML PO SYRP: 5.0000 mL | ORAL_SOLUTION | Freq: Four times a day (QID) | ORAL | Status: DC | PRN

## 2012-03-11 NOTE — Patient Instructions (Addendum)
Take all new medications as prescribed Please also start Aspirin 81 mg - 1 per day - COATED only Please stop smoking Continue all other medications as before Your refills were done today as requested Please have the pharmacy call with any other refills you may need. Please go to XRAY in the Basement for the x-ray test at your convenience Please go to LAB in the Basement for the blood and/or urine tests to be done IN 4 WEEKS (after being back on your meds) You will be contacted by phone if any changes need to be made immediately.  Otherwise, you will receive a letter about your results with an explanation. Please remember to sign up for My Chart at your earliest convenience, as this will be important to you in the future with finding out test results. You will be contacted regarding the referral for: colonoscopy Please return in 1 year for your yearly visit, or sooner if needed, with Lab testing done 3-5 days before

## 2012-03-12 ENCOUNTER — Encounter: Payer: Self-pay | Admitting: Internal Medicine

## 2012-03-12 DIAGNOSIS — F17200 Nicotine dependence, unspecified, uncomplicated: Secondary | ICD-10-CM | POA: Insufficient documentation

## 2012-03-12 DIAGNOSIS — J209 Acute bronchitis, unspecified: Secondary | ICD-10-CM | POA: Insufficient documentation

## 2012-03-12 NOTE — Assessment & Plan Note (Signed)
Mild to mod, for antibx course,  to f/u any worsening symptoms or concerns 

## 2012-03-12 NOTE — Progress Notes (Signed)
Subjective:    Patient ID: Christopher Arias, male    DOB: 1960-06-08, 51 y.o.   MRN: [id number]  HPI  Here for wellness and f/u;  Overall doing ok;  Pt denies CP, worsening SOB, DOE, wheezing, orthopnea, PND, worsening LE edema, palpitations, dizziness or syncope.  Pt denies neurological change such as new Headache, facial or extremity weakness.  Pt denies polydipsia, polyuria, or low sugar symptoms. Pt states overall good compliance with treatment and medications, good tolerability, and trying to follow lower cholesterol diet.  Pt denies worsening depressive symptoms, suicidal ideation or panic. No fever, wt loss, night sweats, loss of appetite, or other constitutional symptoms.  Pt states good ability with ADL's, low fall risk, home safety reviewed and adequate, no significant changes in hearing or vision, and occasionally active with exercise. Also here with acute onset mild to mod 2-3 days ST, HA, general weakness and malaise, with prod cough greenish sputum.   Past Medical History  Diagnosis Date  . HYPOTHYROIDISM, POST-RADIATION 03/16/2010  . ANTERIOR PITUITARY HYPERFUNCTION 11/23/2009  . HYPERLIPIDEMIA 11/24/2007  . ANXIETY 11/24/2007  . GERD 11/24/2007  . GYNECOMASTIA 11/10/2009   Past Surgical History  Procedure Date  . Inguinal hernia repair     reports that he has been smoking.  He does not have any smokeless tobacco history on file. He reports that he drinks alcohol. He reports that he does not use illicit drugs. family history includes COPD in his other; Heart disease in his other; and Stroke in his other. No Known Allergies Current Outpatient Prescriptions on File Prior to Visit  Medication Sig Dispense Refill  . sertraline (ZOLOFT) 100 MG tablet 2 tabs by mouth per day  180 tablet  3  . SYNTHROID 88 MCG tablet 2 tabs by mouth per day  180 tablet  3   Review of Systems Review of Systems  Constitutional: Negative for diaphoresis, activity change, appetite change and unexpected weight  change.  HENT: Negative for hearing loss, ear pain, facial swelling, mouth sores and neck stiffness.   Eyes: Negative for pain, redness and visual disturbance.  Respiratory: Negative for shortness of breath and wheezing.   Cardiovascular: Negative for chest pain and palpitations.  Gastrointestinal: Negative for diarrhea, blood in stool, abdominal distention and rectal pain.  Genitourinary: Negative for hematuria, flank pain and decreased urine volume.  Musculoskeletal: Negative for myalgias and joint swelling.  Skin: Negative for color change and wound.  Neurological: Negative for syncope and numbness.  Hematological: Negative for adenopathy.  Psychiatric/Behavioral: Negative for hallucinations, self-injury, decreased concentration and agitation.      Objective:   Physical Exam BP 138/70  Pulse 87  Temp 97.8 F (36.6 C) (Oral)  Ht 5\' 3"  (1.6 m)  Wt 155 lb 4 oz (70.421 kg)  BMI 27.50 kg/m2  SpO2 96% Physical Exam  VS noted, mild ill Constitutional: Pt is oriented to person, place, and time. Appears well-developed and well-nourished.  HENT:  Head: Normocephalic and atraumatic.  Right Ear: External ear normal.  Left Ear: External ear normal.  Nose: Nose normal.  Bilat tm's mild erythema.  Sinus nontender.  Pharynx mild erythema Eyes: Conjunctivae and EOM are normal. Pupils are equal, round, and reactive to light.  Neck: Normal range of motion. Neck supple. No JVD present. No tracheal deviation present.  Cardiovascular: Normal rate, regular rhythm, normal heart sounds and intact distal pulses.   Pulmonary/Chest: Effort normal and breath sounds normal.  Abdominal: Soft. Bowel sounds are normal. There is no tenderness.  Musculoskeletal: Normal range of motion. Exhibits no edema.  Lymphadenopathy:  Has no cervical adenopathy.  Neurological: Pt is alert and oriented to person, place, and time. Pt has normal reflexes. No cranial nerve deficit.  Skin: Skin is warm and dry. No rash  noted.  Psychiatric:  Has  normal mood and affect. Behavior is normal.     Assessment & Plan:

## 2012-03-12 NOTE — Assessment & Plan Note (Signed)

## 2012-03-12 NOTE — Assessment & Plan Note (Signed)
Urged to quit 

## 2012-03-20 ENCOUNTER — Telehealth: Payer: Self-pay | Admitting: Internal Medicine

## 2012-03-20 NOTE — Telephone Encounter (Signed)
Caller: Michelle/Wife; Patient Name: Christopher Arias; PCP: Oliver Barre (Adults only); Best Callback Phone Number: [phone] Caller states she went by [facility] ( [facility] [address] phone number [phone]) to pick up script for patients' (husband) Synthroid.  Script picked up was for directions to take one tab daily with a total of 90 tabs.  Caller said her husband told her he thought Dr. Jonny Ruiz increased his dose to two tabs daily.  Pharmacy told wife they had two prescriptions on file ( one for one tab daily and one for two tabs daily but needed clarification on correct dose patient should be taking). Wife states [employer] is now requiring then to get a 90 day supply on scripts.  Also, wife states script for Synthroid cost $62.00 and she is wondering if Dr. Jonny Ruiz feels patient would do as well with the generic for lesser cost. Verified from [facility] that patient should be taking two tabs daily and provided wife with this information. Advised triage nurse will also clarify med instructions for Synthroid with pharmacist and will send note to Dr. Jonny Ruiz reguarding med question for change to possible generic. Wife verbalized understanding and agreement. She requested any messages from MD be left on her voice mail.  Triage nurse spoke with Gearldine Bienenstock , [facility] [phone]) and advised patient 's synthroid dose should now be two tabs daily. Understanding and agreement verbalized by Pharmacist.  Pharmacist requested wife bring script picked up this am back to pharmacy for refund and will submit new prescription for Synthoid, two tabs daily for insurance to cover. Call placed to wife, message to return prescription picked up to pharmacy for refund and will process new prescription left on voice mail.

## 2012-03-23 ENCOUNTER — Telehealth: Payer: Self-pay

## 2012-03-23 MED ORDER — LEVOTHYROXINE SODIUM 88 MCG PO TABS: 88.0000 ug | ORAL_TABLET | Freq: Every day | ORAL | Status: DC

## 2012-03-23 NOTE — Telephone Encounter (Signed)
Pharmacy requesting per pt. Request to change to generic synthroid please advise if ok

## 2012-03-23 NOTE — Telephone Encounter (Signed)
Ok done erx 

## 2012-03-24 ENCOUNTER — Telehealth: Payer: Self-pay | Admitting: Internal Medicine

## 2012-03-24 NOTE — Telephone Encounter (Signed)
Caller: Christopher Arias/Spouse; Patient Name: Christopher Arias; PCP: Oliver Barre (Adults only); Best Callback Phone Number: [phone].  Caller states insurance has changed this year, and needs all prescriptions to be 90-day generics if possible.  States takes xanax 0.5mg  BID which was written as a 30-day Rx, and synthroid tab, takes 2 tabs q d, but Rx written for once daily as a 90-day Rx.  Rx for zoloft written correctly.   All medicaitons must be generic.  Info to office for staff/provider review/Rx/callback.   [facility]   May reach caller at [phone].

## 2012-03-25 MED ORDER — LEVOTHYROXINE SODIUM 88 MCG PO TABS: ORAL_TABLET | ORAL | Status: DC

## 2012-03-25 NOTE — Telephone Encounter (Signed)
Patient informed of medication change and instructions on xanax

## 2012-03-25 NOTE — Telephone Encounter (Signed)
Called left message to call back 

## 2012-03-25 NOTE — Telephone Encounter (Signed)
Thyroid rx corrected  I decline to change the xanax due to controlled subtance nature of the med

## 2012-03-25 NOTE — Telephone Encounter (Signed)
Caller: Christopher Arias/Patient; Patient Name: [name], Christopher Arias; PCP: Leschber, Valerie (Adults only); Best Callback Phone Number: [phone]; Call regarding: Burining Mouth; onset a week ago to roof of mouth and all the way back; slightly swollen; taking Amoxicillin prior to having a tooth removed 03/31/12; said she has tried gargling salt water, but is having no relief; does not see any white patches or sores in her mouth; All emergent sxs of Mouth Lesions protocol r/o except "gums are newly red, swollen, or painful"; disp see dentist within 24hrs; Christopher Arias wondering if Dr.Leschber has any ideas to help the burning; also instructed to call her dentist to see what he recommends °

## 2012-04-12 ENCOUNTER — Other Ambulatory Visit: Payer: Self-pay | Admitting: Internal Medicine

## 2012-04-13 NOTE — Telephone Encounter (Signed)
Xanax request too soon, just done oct 2013 for 3 mo total

## 2012-09-13 ENCOUNTER — Emergency Department (HOSPITAL_BASED_OUTPATIENT_CLINIC_OR_DEPARTMENT_OTHER)
Admission: EM | Admit: 2012-09-13 | Discharge: 2012-09-13 | Disposition: A | Discharge status: Home / Self Care | Payer: BC Managed Care – PPO | Attending: Emergency Medicine | Admitting: Emergency Medicine

## 2012-09-13 ENCOUNTER — Emergency Department (HOSPITAL_BASED_OUTPATIENT_CLINIC_OR_DEPARTMENT_OTHER): Payer: BC Managed Care – PPO

## 2012-09-13 ENCOUNTER — Encounter (HOSPITAL_BASED_OUTPATIENT_CLINIC_OR_DEPARTMENT_OTHER): Payer: Self-pay

## 2012-09-13 DIAGNOSIS — Z862 Personal history of diseases of the blood and blood-forming organs and certain disorders involving the immune mechanism: Secondary | ICD-10-CM | POA: Insufficient documentation

## 2012-09-13 DIAGNOSIS — W268XXA Contact with other sharp object(s), not elsewhere classified, initial encounter: Secondary | ICD-10-CM | POA: Insufficient documentation

## 2012-09-13 DIAGNOSIS — Z79899 Other long term (current) drug therapy: Secondary | ICD-10-CM | POA: Insufficient documentation

## 2012-09-13 DIAGNOSIS — E039 Hypothyroidism, unspecified: Secondary | ICD-10-CM | POA: Insufficient documentation

## 2012-09-13 DIAGNOSIS — S61411A Laceration without foreign body of right hand, initial encounter: Secondary | ICD-10-CM

## 2012-09-13 DIAGNOSIS — Y939 Activity, unspecified: Secondary | ICD-10-CM | POA: Insufficient documentation

## 2012-09-13 DIAGNOSIS — Y9289 Other specified places as the place of occurrence of the external cause: Secondary | ICD-10-CM | POA: Insufficient documentation

## 2012-09-13 DIAGNOSIS — Z8639 Personal history of other endocrine, nutritional and metabolic disease: Secondary | ICD-10-CM | POA: Insufficient documentation

## 2012-09-13 DIAGNOSIS — Z23 Encounter for immunization: Secondary | ICD-10-CM | POA: Insufficient documentation

## 2012-09-13 DIAGNOSIS — Z7982 Long term (current) use of aspirin: Secondary | ICD-10-CM | POA: Insufficient documentation

## 2012-09-13 DIAGNOSIS — F17200 Nicotine dependence, unspecified, uncomplicated: Secondary | ICD-10-CM | POA: Insufficient documentation

## 2012-09-13 DIAGNOSIS — F411 Generalized anxiety disorder: Secondary | ICD-10-CM | POA: Insufficient documentation

## 2012-09-13 DIAGNOSIS — E229 Hyperfunction of pituitary gland, unspecified: Secondary | ICD-10-CM | POA: Insufficient documentation

## 2012-09-13 DIAGNOSIS — S61409A Unspecified open wound of unspecified hand, initial encounter: Secondary | ICD-10-CM | POA: Insufficient documentation

## 2012-09-13 DIAGNOSIS — W1789XA Other fall from one level to another, initial encounter: Secondary | ICD-10-CM | POA: Insufficient documentation

## 2012-09-13 DIAGNOSIS — Z8719 Personal history of other diseases of the digestive system: Secondary | ICD-10-CM | POA: Insufficient documentation

## 2012-09-13 MED ORDER — CEPHALEXIN 500 MG PO CAPS: 500.0000 mg | ORAL_CAPSULE | Freq: Four times a day (QID) | ORAL | Status: DC

## 2012-09-13 MED ORDER — CEPHALEXIN 250 MG PO CAPS
1000.0000 mg | ORAL_CAPSULE | Freq: Once | ORAL | Status: AC
  Administered 2012-09-13: 1000 mg via ORAL
  Filled 2012-09-13: qty 4

## 2012-09-13 MED ORDER — HYDROCODONE-ACETAMINOPHEN 5-325 MG PO TABS: 1.0000 | ORAL_TABLET | Freq: Four times a day (QID) | ORAL | Status: DC | PRN

## 2012-09-13 MED ORDER — TETANUS-DIPHTH-ACELL PERTUSSIS 5-2.5-18.5 LF-MCG/0.5 IM SUSP
0.5000 mL | Freq: Once | INTRAMUSCULAR | Status: AC
  Administered 2012-09-13: 0.5 mL via INTRAMUSCULAR
  Filled 2012-09-13: qty 0.5

## 2012-09-13 NOTE — ED Provider Notes (Signed)
History     CSN: [id number]  Arrival date & time 09/13/12  0009   None     Chief Complaint  Patient presents with  . Hand Injury    (Consider location/radiation/quality/duration/timing/severity/associated sxs/prior treatment) HPI This is a 52 year old male who fell out of a hammock yesterday evening. When he fell he cut his right thenar eminence on a ceramic pot. There is a laceration to his right thenar eminence. Bleeding has been controlled with pressure. The wounds are gated by staff prior to my evaluation. There is minimal associated pain. There is no sensory or functional deficit. He denies other injury.  Past Medical History  Diagnosis Date  . HYPOTHYROIDISM, POST-RADIATION 03/16/2010  . ANTERIOR PITUITARY HYPERFUNCTION 11/23/2009  . HYPERLIPIDEMIA 11/24/2007  . ANXIETY 11/24/2007  . GERD 11/24/2007  . GYNECOMASTIA 11/10/2009    Past Surgical History  Procedure Laterality Date  . Inguinal hernia repair      Family History  Problem Relation Age of Onset  . Stroke Other   . COPD Other   . Heart disease Other     History  Substance Use Topics  . Smoking status: Current Every Day Smoker -- 0.50 packs/day    Types: Cigarettes  . Smokeless tobacco: Not on file  . Alcohol Use: Yes     Comment: rarely      Review of Systems  All other systems reviewed and are negative.    Allergies  Review of patient's allergies indicates no known allergies.  Home Medications   Current Outpatient Rx  Name  Route  Sig  Dispense  Refill  . ALPRAZolam (XANAX) 0.5 MG tablet   Oral   Take 1 tablet (0.5 mg total) by mouth 2 (two) times daily as needed.   60 tablet   2   . aspirin 81 MG EC tablet   Oral   Take 1 tablet (81 mg total) by mouth daily. Swallow whole.   30 tablet   12   . levothyroxine (SYNTHROID, LEVOTHROID) 88 MCG tablet      2 tabs by mouth per day   180 tablet   3   . sertraline (ZOLOFT) 100 MG tablet      2 tabs by mouth per day   180 tablet    3     Patient needs office visit for future refills.     BP 125/70  Pulse 92  Temp(Src) 99.2 F (37.3 C) (Oral)  Resp 18  SpO2 96%  Physical Exam General: Well-developed, well-nourished male in no acute distress; appearance consistent with age of record HENT: normocephalic, atraumatic Eyes: pupils equal round and reactive to light; extraocular muscles intact Neck: supple Heart: regular rate and rhythm Lungs: Normal respiratory effort and excursion Abdomen: soft; nondistended Extremities: No deformity; full range of motion; pulses normal; laceration of right thenar eminence without motor or sensory deficit distally, capillary refill brisk distally Neurologic: Awake, alert and oriented; motor function intact in all extremities and symmetric; no facial droop Skin: Warm and dry Psychiatric: Normal mood and affect    ED Course  Procedures (including critical care time)  LACERATION REPAIR Performed by: Blaire Hodsdon L Authorized by: Hanley Seamen Consent: Verbal consent obtained. Risks and benefits: risks, benefits and alternatives were discussed Consent given by: patient Patient identity confirmed: provided demographic data Prepped and Draped in normal sterile fashion Wound explored  Laceration Location: Right thenar eminence  Laceration Length: 4 cm  No Foreign Bodies seen or palpated  Anesthesia: local infiltration  Local anesthetic:  lidocaine 2 % with epinephrine  Anesthetic total: 3 ml  Irrigation method: syringe Amount of cleaning: standard  Skin closure: 4-0 Prolene   Number of sutures: 7   Technique: Simple interrupted   Patient tolerance: Patient tolerated the procedure well with no immediate complications.    MDM  Nursing notes and vitals signs, including pulse oximetry, reviewed.  Summary of this visit's results, reviewed by myself:  Labs:  No results found for this or any previous visit (from the past 24 hour(s)).  Imaging Studies: Dg  Hand Complete Right  09/13/2012  *RADIOLOGY REPORT*  Clinical Data: Fall.  Right hand pain and swelling.  Laceration.  RIGHT HAND - COMPLETE 3+ VIEW  Comparison: None.  Findings: No evidence of fracture or dislocation.  No evidence of arthropathy other bone abnormality.  No evidence of radiopaque foreign body.  IMPRESSION: Negative.   Original Report Authenticated By: Myles Rosenthal, M.D.             Hanley Seamen, MD 09/13/12 [phone]

## 2012-09-13 NOTE — ED Notes (Signed)
Patient reports that he fell out of a hammock cutting right hand on ceramic pot, arrived with dressing to same.

## 2012-09-13 NOTE — ED Notes (Signed)
MD at bedside. 

## 2012-09-13 NOTE — ED Notes (Signed)
I had patient soak hand momentarily in 50/50 saline/iodinesolution to loosen gauze stuck to wound. I placed saline soaked gauze over wound. Room and suture cart now prepped for proceedures.

## 2013-01-25 ENCOUNTER — Other Ambulatory Visit: Payer: Self-pay | Admitting: Internal Medicine

## 2013-01-25 NOTE — Telephone Encounter (Signed)
Done hardcopy to robin  

## 2013-01-25 NOTE — Telephone Encounter (Signed)
Faxed hardcopy to [facility] 

## 2013-01-26 ENCOUNTER — Other Ambulatory Visit: Payer: Self-pay | Admitting: Internal Medicine

## 2013-01-27 ENCOUNTER — Other Ambulatory Visit: Payer: Self-pay | Admitting: Internal Medicine

## 2013-03-12 ENCOUNTER — Ambulatory Visit (INDEPENDENT_AMBULATORY_CARE_PROVIDER_SITE_OTHER): Payer: BC Managed Care – PPO | Admitting: Internal Medicine

## 2013-03-12 ENCOUNTER — Encounter: Payer: Self-pay | Admitting: Internal Medicine

## 2013-03-12 ENCOUNTER — Other Ambulatory Visit: Payer: Self-pay | Admitting: Internal Medicine

## 2013-03-12 ENCOUNTER — Other Ambulatory Visit (INDEPENDENT_AMBULATORY_CARE_PROVIDER_SITE_OTHER): Payer: BC Managed Care – PPO

## 2013-03-12 ENCOUNTER — Telehealth: Payer: Self-pay | Admitting: *Deleted

## 2013-03-12 VITALS — BP 110/68 | HR 112 | Temp 97.6°F | Ht 65.0 in | Wt 149.0 lb

## 2013-03-12 DIAGNOSIS — Z0000 Encounter for general adult medical examination without abnormal findings: Secondary | ICD-10-CM

## 2013-03-12 DIAGNOSIS — R972 Elevated prostate specific antigen [PSA]: Secondary | ICD-10-CM

## 2013-03-12 DIAGNOSIS — Z23 Encounter for immunization: Secondary | ICD-10-CM

## 2013-03-12 LAB — CBC WITH DIFFERENTIAL/PLATELET
Basophils Absolute: 0.1 10*3/uL (ref 0.0–0.1)
Basophils Relative: 0.5 % (ref 0.0–3.0)
Hemoglobin: 16.5 g/dL (ref 13.0–17.0)
Lymphocytes Relative: 15.8 % (ref 12.0–46.0)
Monocytes Relative: 6.5 % (ref 3.0–12.0)
Neutro Abs: 8 10*3/uL — ABNORMAL HIGH (ref 1.4–7.7)
RBC: 5.58 Mil/uL (ref 4.22–5.81)
RDW: 13.9 % (ref 11.5–14.6)
WBC: 10.5 10*3/uL (ref 4.5–10.5)

## 2013-03-12 LAB — URINALYSIS, ROUTINE W REFLEX MICROSCOPIC
Bilirubin Urine: NEGATIVE
Ketones, ur: NEGATIVE
Total Protein, Urine: NEGATIVE
Urine Glucose: NEGATIVE

## 2013-03-12 LAB — HEPATIC FUNCTION PANEL
AST: 18 U/L (ref 0–37)
Albumin: 4.5 g/dL (ref 3.5–5.2)
Alkaline Phosphatase: 73 U/L (ref 39–117)

## 2013-03-12 LAB — TSH: TSH: 0.03 u[IU]/mL — ABNORMAL LOW (ref 0.35–5.50)

## 2013-03-12 LAB — BASIC METABOLIC PANEL
CO2: 27 mEq/L (ref 19–32)
Calcium: 9.6 mg/dL (ref 8.4–10.5)
Creatinine, Ser: 0.9 mg/dL (ref 0.4–1.5)

## 2013-03-12 LAB — LIPID PANEL
Total CHOL/HDL Ratio: 5
Triglycerides: 70 mg/dL (ref 0.0–149.0)

## 2013-03-12 MED ORDER — SERTRALINE HCL 100 MG PO TABS: ORAL_TABLET | ORAL | Status: DC

## 2013-03-12 MED ORDER — LEVOTHYROXINE SODIUM 88 MCG PO TABS: ORAL_TABLET | ORAL | Status: DC

## 2013-03-12 MED ORDER — ALPRAZOLAM 0.5 MG PO TABS: ORAL_TABLET | ORAL | Status: DC

## 2013-03-12 NOTE — Telephone Encounter (Signed)
To robin   

## 2013-03-12 NOTE — Telephone Encounter (Signed)
Called the patient back and informed of lab results. 

## 2013-03-12 NOTE — Progress Notes (Signed)
Subjective:    Patient ID: Christopher Arias, male    DOB: July 08, 1960, 52 y.o.   MRN: [id number]  HPI  Here for wellness and f/u;  Overall doing ok;  Pt denies CP, worsening SOB, DOE, wheezing, orthopnea, PND, worsening LE edema, palpitations, dizziness or syncope.  Pt denies neurological change such as new headache, facial or extremity weakness.  Pt denies polydipsia, polyuria, or low sugar symptoms. Pt states overall good compliance with treatment and medications, good tolerability, and has been trying to follow lower cholesterol diet.  Pt denies worsening depressive symptoms, suicidal ideation or panic. No fever, night sweats, wt loss, loss of appetite, or other constitutional symptoms.  Pt states good ability with ADL's, has low fall risk, home safety reviewed and adequate, no other significant changes in hearing or vision, and only occasionally active with exercise.  No acute complaints Past Medical History  Diagnosis Date  . HYPOTHYROIDISM, POST-RADIATION 03/16/2010  . ANTERIOR PITUITARY HYPERFUNCTION 11/23/2009  . HYPERLIPIDEMIA 11/24/2007  . ANXIETY 11/24/2007  . GERD 11/24/2007  . GYNECOMASTIA 11/10/2009   Past Surgical History  Procedure Laterality Date  . Inguinal hernia repair      reports that he has been smoking Cigarettes.  He has been smoking about 0.50 packs per day. He does not have any smokeless tobacco history on file. He reports that he drinks alcohol. He reports that he does not use illicit drugs. family history includes COPD in his other; Heart disease in his other; Stroke in his other. No Known Allergies Current Outpatient Prescriptions on File Prior to Visit  Medication Sig Dispense Refill  . aspirin 81 MG EC tablet Take 1 tablet (81 mg total) by mouth daily. Swallow whole.  30 tablet  12  . cephALEXin (KEFLEX) 500 MG capsule Take 1 capsule (500 mg total) by mouth 4 (four) times daily.  20 capsule  0  . HYDROcodone-acetaminophen (NORCO/VICODIN) 5-325 MG per tablet Take 1-2  tablets by mouth every 6 (six) hours as needed for pain.  12 tablet  0   No current facility-administered medications on file prior to visit.   Review of Systems Constitutional: Negative for diaphoresis, activity change, appetite change or unexpected weight change.  HENT: Negative for hearing loss, ear pain, facial swelling, mouth sores and neck stiffness.   Eyes: Negative for pain, redness and visual disturbance.  Respiratory: Negative for shortness of breath and wheezing.   Cardiovascular: Negative for chest pain and palpitations.  Gastrointestinal: Negative for diarrhea, blood in stool, abdominal distention or other pain Genitourinary: Negative for hematuria, flank pain or change in urine volume.  Musculoskeletal: Negative for myalgias and joint swelling.  Skin: Negative for color change and wound.  Neurological: Negative for syncope and numbness. other than noted Hematological: Negative for adenopathy.  Psychiatric/Behavioral: Negative for hallucinations, self-injury, decreased concentration and agitation.      Objective:   Physical Exam BP 110/68  Pulse 112  Temp(Src) 97.6 F (36.4 C) (Oral)  Ht 5\' 5"  (1.651 m)  Wt 149 lb (67.586 kg)  BMI 24.79 kg/m2  SpO2 92% VS noted,  Constitutional: Pt is oriented to person, place, and time. Appears well-developed and well-nourished.  Head: Normocephalic and atraumatic.  Right Ear: External ear normal.  Left Ear: External ear normal.  Nose: Nose normal.  Mouth/Throat: Oropharynx is clear and moist.  Eyes: Conjunctivae and EOM are normal. Pupils are equal, round, and reactive to light.  Neck: Normal range of motion. Neck supple. No JVD present. No tracheal deviation present.  Cardiovascular: Normal rate, regular rhythm, normal heart sounds and intact distal pulses.   Pulmonary/Chest: Effort normal and breath sounds normal.  Abdominal: Soft. Bowel sounds are normal. There is no tenderness. No HSM  Musculoskeletal: Normal range of  motion. Exhibits no edema.  Lymphadenopathy:  Has no cervical adenopathy.  Neurological: Pt is alert and oriented to person, place, and time. Pt has normal reflexes. No cranial nerve deficit.  Skin: Skin is warm and dry. No rash noted.  Psychiatric:  Has  normal mood and affect. Behavior is normal.     Assessment & Plan:

## 2013-03-12 NOTE — Assessment & Plan Note (Signed)

## 2013-03-12 NOTE — Patient Instructions (Addendum)
You had the flu shot today Your EKG was OK today Your blood work was drawn this AM, and pending You will be contacted by phone if any changes need to be made immediately.  Otherwise, you will receive a letter about your results with an explanation, but please check with MyChart first. Please continue your efforts at being more active, low cholesterol diet, and weight control. You are otherwise up to date with prevention measures today. Please keep your appointments with your specialists as you may have planned Please continue all other medications as before, and refills have been done if requested - the xanax and the rest You will be contacted regarding the referral for: colonoscopy  Please remember to sign up for My Chart if you have not done so, as this will be important to you in the future with finding out test results, communicating by private email, and scheduling acute appointments online when needed.  Please return in 1 year for your yearly visit, or sooner if needed, with Lab testing done 3-5 days before

## 2013-03-12 NOTE — Telephone Encounter (Signed)
Pt called states he is returning phone call from [facility].  Please advise

## 2013-03-18 ENCOUNTER — Encounter: Payer: Self-pay | Admitting: Internal Medicine

## 2013-04-12 ENCOUNTER — Ambulatory Visit (AMBULATORY_SURGERY_CENTER): Payer: Self-pay | Admitting: *Deleted

## 2013-04-12 VITALS — Ht 63.0 in | Wt 152.2 lb

## 2013-04-12 DIAGNOSIS — Z1211 Encounter for screening for malignant neoplasm of colon: Secondary | ICD-10-CM

## 2013-04-12 MED ORDER — MOVIPREP 100 G PO SOLR: ORAL | Status: DC

## 2013-04-12 NOTE — Progress Notes (Signed)
No allergies to eggs or soy. No problems with anesthesia.  

## 2013-04-15 ENCOUNTER — Encounter: Payer: Self-pay | Admitting: Internal Medicine

## 2013-04-27 ENCOUNTER — Ambulatory Visit (AMBULATORY_SURGERY_CENTER): Payer: BC Managed Care – PPO | Admitting: Internal Medicine

## 2013-04-27 ENCOUNTER — Encounter: Payer: Self-pay | Admitting: Internal Medicine

## 2013-04-27 VITALS — BP 143/78 | HR 88 | Temp 97.8°F | Resp 21 | Ht 63.0 in | Wt 152.0 lb

## 2013-04-27 DIAGNOSIS — D126 Benign neoplasm of colon, unspecified: Secondary | ICD-10-CM

## 2013-04-27 DIAGNOSIS — Z1211 Encounter for screening for malignant neoplasm of colon: Secondary | ICD-10-CM

## 2013-04-27 MED ORDER — SODIUM CHLORIDE 0.9 % IV SOLN: 500.0000 mL | INTRAVENOUS | Status: DC

## 2013-04-27 NOTE — Progress Notes (Signed)
Patient did not experience any of the following events: a burn prior to discharge; a fall within the facility; wrong site/side/patient/procedure/implant event; or a hospital transfer or hospital admission upon discharge from the facility. (G8907) Patient did not have preoperative order for IV antibiotic SSI prophylaxis. (G8918)  

## 2013-04-27 NOTE — Op Note (Signed)
[facility] [address] [place], [address]   COLONOSCOPY PROCEDURE REPORT  PATIENT: Arias, Christopher  MR#: [id number] BIRTHDATE: 09/27/1960 , 52  yrs. old GENDER: Male ENDOSCOPIST: Beverley Fiedler, MD REFERRED AO:ZHYQM John, M.D. PROCEDURE DATE:  04/27/2013 PROCEDURE:   Colonoscopy with cold biopsy polypectomy First Screening Colonoscopy - Avg.  risk and is 50 yrs.  old or older Yes.  Prior Negative Screening - Now for repeat screening. N/A  History of Adenoma - Now for follow-up colonoscopy & has been > or = to 3 yrs.  N/A  Polyps Removed Today? Yes. ASA CLASS:   Class II INDICATIONS:average risk screening and first colonoscopy. MEDICATIONS: MAC sedation, administered by CRNA and propofol (Diprivan) 350mg  IV  DESCRIPTION OF PROCEDURE:   After the risks benefits and alternatives of the procedure were thoroughly explained, informed consent was obtained.  A digital rectal exam revealed external hemorrhoids.   The LB [id number]  endoscope was introduced through the anus and advanced to the cecum, which was identified by both the appendix and ileocecal valve. No adverse events experienced.   The quality of the prep was Moviprep fair  The instrument was then slowly withdrawn as the colon was fully examined.   COLON FINDINGS: Three sessile polyps ranging between 3-48mm in size were found in the sigmoid colon and rectosigmoid colon. Polypectomy was performed with cold forceps.  All resections were complete and all polyp tissue was completely retrieved.   Moderate sized internal and external hemorrhoids were found.  Retroflexed views revealed internal/external hemorrhoids. The time to cecum=6 minutes 49 seconds.  Withdrawal time=11 minutes 28 seconds.  The scope was withdrawn and the procedure completed. COMPLICATIONS: There were no complications.  ENDOSCOPIC IMPRESSION: 1.   Three sessile polyps ranging between 3-29mm in size were found in the sigmoid colon and  rectosigmoid colon; Polypectomy was performed with cold forceps 2.   Moderate sized internal and external hemorrhoids  RECOMMENDATIONS: 1.  Await pathology results 2.  Timing of repeat colonoscopy will be determined by pathology findings. 3.  You will receive a letter within 1-2 weeks with the results of your biopsy as well as final recommendations.  Please call my office if you have not received a letter after 3 weeks.   eSigned:  Beverley Fiedler, MD 04/27/2013 2:02 PM   cc: The Patient and Corwin Levins, MD

## 2013-04-27 NOTE — Patient Instructions (Addendum)
YOU HAD AN ENDOSCOPIC PROCEDURE TODAY AT THE [facility]: Refer to the procedure report that was given to you for any specific questions about what was found during the examination.  If the procedure report does not answer your questions, please call your gastroenterologist to clarify.  If you requested that your care partner not be given the details of your procedure findings, then the procedure report has been included in a sealed envelope for you to review at your convenience later.  YOU SHOULD EXPECT: Some feelings of bloating in the abdomen. Passage of more gas than usual.  Walking can help get rid of the air that was put into your GI tract during the procedure and reduce the bloating. If you had a lower endoscopy (such as a colonoscopy or flexible sigmoidoscopy) you may notice spotting of blood in your stool or on the toilet paper. If you underwent a bowel prep for your procedure, then you may not have a normal bowel movement for a few days.  DIET: Your first meal following the procedure should be a light meal and then it is ok to progress to your normal diet.  A half-sandwich or bowl of soup is an example of a good first meal.  Heavy or fried foods are harder to digest and may make you feel nauseous or bloated.  Likewise meals heavy in dairy and vegetables can cause extra gas to form and this can also increase the bloating.  Drink plenty of fluids but you should avoid alcoholic beverages for 24 hours.  ACTIVITY: Your care partner should take you home directly after the procedure.  You should plan to take it easy, moving slowly for the rest of the day.  You can resume normal activity the day after the procedure however you should NOT DRIVE or use heavy machinery for 24 hours (because of the sedation medicines used during the test).    SYMPTOMS TO REPORT IMMEDIATELY: A gastroenterologist can be reached at any hour.  During normal business hours, 8:30 AM to 5:00 PM Monday through Friday,  call [phone].  After hours and on weekends, please call the GI answering service at [phone] who will take a message and have the physician on call contact you.   Following lower endoscopy (colonoscopy or flexible sigmoidoscopy):  Excessive amounts of blood in the stool  Significant tenderness or worsening of abdominal pains  Swelling of the abdomen that is new, acute  Fever of 100F or higher    FOLLOW UP: If any biopsies were taken you will be contacted by phone or by letter within the next 1-3 weeks.  Call your gastroenterologist if you have not heard about the biopsies in 3 weeks.  Our staff will call the home number listed on your records the next business day following your procedure to check on you and address any questions or concerns that you may have at that time regarding the information given to you following your procedure. This is a courtesy call and so if there is no answer at the home number and we have not heard from you through the emergency physician on call, we will assume that you have returned to your regular daily activities without incident.  SIGNATURES/CONFIDENTIALITY: You and/or your care partner have signed paperwork which will be entered into your electronic medical record.  These signatures attest to the fact that that the information above on your After Visit Summary has been reviewed and is understood.  Full responsibility of the confidentiality   of this discharge information lies with you and/or your care-partner.   Information on polyps & hemorrhoids given to you today

## 2013-04-27 NOTE — Progress Notes (Signed)
Called to room to assist during endoscopic procedure.  Patient ID and intended procedure confirmed with present staff. Received instructions for my participation in the procedure from the performing physician.  

## 2013-04-28 ENCOUNTER — Telehealth: Payer: Self-pay | Admitting: *Deleted

## 2013-04-28 NOTE — Telephone Encounter (Signed)
  Follow up Call-  Call back number 04/27/2013  Post procedure Call Back phone  # [phone]  Permission to leave phone message Yes     Patient questions:  Do you have a fever, pain , or abdominal swelling? no Pain Score  0 *  Have you tolerated food without any problems? yes  Have you been able to return to your normal activities? yes  Do you have any questions about your discharge instructions: Diet   no Medications  no Follow up visit  no  Do you have questions or concerns about your Care? no  Actions: * If pain score is 4 or above: No action needed, pain <4.

## 2013-05-04 ENCOUNTER — Encounter: Payer: Self-pay | Admitting: Internal Medicine

## 2013-05-08 ENCOUNTER — Other Ambulatory Visit: Payer: Self-pay | Admitting: Internal Medicine

## 2013-05-10 NOTE — Telephone Encounter (Signed)
Refill done.  

## 2013-05-12 ENCOUNTER — Other Ambulatory Visit: Payer: Self-pay | Admitting: Internal Medicine

## 2013-05-13 NOTE — Telephone Encounter (Signed)
Ok for robin to let pt know - seems refill xanax too soon, as last rx for total 4 mo was done about mid October 2014

## 2013-05-13 NOTE — Telephone Encounter (Signed)
Informed the patient of MD instructions.  The patient apologized as his wife had called in the wrong prescription was not to call in xanax as aware of refills left.  Please disregard this request.

## 2013-06-17 ENCOUNTER — Other Ambulatory Visit: Payer: Self-pay | Admitting: Internal Medicine

## 2013-10-12 ENCOUNTER — Other Ambulatory Visit: Payer: Self-pay | Admitting: Internal Medicine

## 2013-10-12 NOTE — Telephone Encounter (Signed)
Faxed hardcopy to [facility] 

## 2013-10-12 NOTE — Telephone Encounter (Signed)
Done hardcopy to robin  

## 2014-04-03 ENCOUNTER — Other Ambulatory Visit: Payer: Self-pay | Admitting: Internal Medicine

## 2014-04-04 NOTE — Telephone Encounter (Signed)
Done hardcopy to robin  

## 2014-04-05 NOTE — Telephone Encounter (Signed)
Faxed hardcopy for Alprazolam to [facility]

## 2014-05-18 ENCOUNTER — Other Ambulatory Visit: Payer: Self-pay | Admitting: Internal Medicine

## 2014-05-18 NOTE — Telephone Encounter (Signed)
Faxed hardcopy for Alprazolam to [facility] Dr Letta Kocher [place]

## 2014-05-18 NOTE — Telephone Encounter (Signed)
Done hardcopy to [facility] for further refills

## 2014-05-31 ENCOUNTER — Other Ambulatory Visit: Payer: Self-pay | Admitting: Internal Medicine

## 2014-06-13 ENCOUNTER — Other Ambulatory Visit: Payer: Self-pay | Admitting: Internal Medicine

## 2014-06-14 ENCOUNTER — Other Ambulatory Visit: Payer: Self-pay | Admitting: *Deleted

## 2014-06-14 MED ORDER — SERTRALINE HCL 100 MG PO TABS: 200.0000 mg | ORAL_TABLET | Freq: Every day | ORAL | Status: DC

## 2014-06-14 NOTE — Telephone Encounter (Signed)
[facility] JG//CMA

## 2014-06-14 NOTE — Telephone Encounter (Signed)
Done hardcopy to [facility]

## 2014-06-16 ENCOUNTER — Telehealth: Payer: Self-pay | Admitting: Internal Medicine

## 2014-06-16 ENCOUNTER — Ambulatory Visit: Payer: Self-pay | Admitting: Internal Medicine

## 2014-06-16 NOTE — Telephone Encounter (Signed)
Pt didn't show up for the appt today, please advise if we need to call and reschedule.

## 2014-06-16 NOTE — Telephone Encounter (Signed)
Ok to call to offer appt

## 2014-06-17 ENCOUNTER — Telehealth: Payer: Self-pay | Admitting: Internal Medicine

## 2014-06-17 ENCOUNTER — Encounter: Payer: Self-pay | Admitting: Internal Medicine

## 2014-06-17 ENCOUNTER — Ambulatory Visit (INDEPENDENT_AMBULATORY_CARE_PROVIDER_SITE_OTHER): Payer: BLUE CROSS/BLUE SHIELD | Admitting: Internal Medicine

## 2014-06-17 ENCOUNTER — Other Ambulatory Visit (INDEPENDENT_AMBULATORY_CARE_PROVIDER_SITE_OTHER): Payer: BLUE CROSS/BLUE SHIELD

## 2014-06-17 VITALS — BP 130/72 | HR 104 | Temp 98.5°F | Ht 64.0 in | Wt 153.8 lb

## 2014-06-17 DIAGNOSIS — Z23 Encounter for immunization: Secondary | ICD-10-CM

## 2014-06-17 DIAGNOSIS — Z0000 Encounter for general adult medical examination without abnormal findings: Secondary | ICD-10-CM

## 2014-06-17 DIAGNOSIS — Z0189 Encounter for other specified special examinations: Secondary | ICD-10-CM

## 2014-06-17 LAB — CBC WITH DIFFERENTIAL/PLATELET
BASOS ABS: 0.1 10*3/uL (ref 0.0–0.1)
Basophils Relative: 0.7 % (ref 0.0–3.0)
EOS ABS: 0.1 10*3/uL (ref 0.0–0.7)
EOS PCT: 1 % (ref 0.0–5.0)
HEMATOCRIT: 46.3 % (ref 39.0–52.0)
Hemoglobin: 15.5 g/dL (ref 13.0–17.0)
LYMPHS PCT: 18 % (ref 12.0–46.0)
Lymphs Abs: 1.7 10*3/uL (ref 0.7–4.0)
MCHC: 33.4 g/dL (ref 30.0–36.0)
MCV: 86.7 fl (ref 78.0–100.0)
Monocytes Absolute: 0.7 10*3/uL (ref 0.1–1.0)
Monocytes Relative: 6.9 % (ref 3.0–12.0)
Neutro Abs: 7.1 10*3/uL (ref 1.4–7.7)
Neutrophils Relative %: 73.4 % (ref 43.0–77.0)
PLATELETS: 203 10*3/uL (ref 150.0–400.0)
RBC: 5.34 Mil/uL (ref 4.22–5.81)
RDW: 14.1 % (ref 11.5–15.5)
WBC: 9.6 10*3/uL (ref 4.0–10.5)

## 2014-06-17 LAB — URINALYSIS, ROUTINE W REFLEX MICROSCOPIC
BILIRUBIN URINE: NEGATIVE
KETONES UR: NEGATIVE
Leukocytes, UA: NEGATIVE
Nitrite: NEGATIVE
Total Protein, Urine: NEGATIVE
UROBILINOGEN UA: 0.2 (ref 0.0–1.0)
Urine Glucose: NEGATIVE
pH: 5.5 (ref 5.0–8.0)

## 2014-06-17 LAB — HEPATIC FUNCTION PANEL
ALBUMIN: 4.1 g/dL (ref 3.5–5.2)
ALT: 17 U/L (ref 0–53)
AST: 16 U/L (ref 0–37)
Alkaline Phosphatase: 73 U/L (ref 39–117)
BILIRUBIN DIRECT: 0 mg/dL (ref 0.0–0.3)
BILIRUBIN TOTAL: 0.4 mg/dL (ref 0.2–1.2)
Total Protein: 7 g/dL (ref 6.0–8.3)

## 2014-06-17 LAB — PSA: PSA: 4.07 ng/mL — AB (ref 0.10–4.00)

## 2014-06-17 LAB — BASIC METABOLIC PANEL
BUN: 23 mg/dL (ref 6–23)
CHLORIDE: 107 meq/L (ref 96–112)
CO2: 25 mEq/L (ref 19–32)
CREATININE: 0.85 mg/dL (ref 0.40–1.50)
Calcium: 9.2 mg/dL (ref 8.4–10.5)
GFR: 100.15 mL/min (ref >60.00)
GLUCOSE: 107 mg/dL — AB (ref 70–99)
POTASSIUM: 4.1 meq/L (ref 3.5–5.1)
Sodium: 137 mEq/L (ref 135–145)

## 2014-06-17 LAB — LIPID PANEL
Cholesterol: 180 mg/dL (ref 0–200)
HDL: 35.8 mg/dL — AB (ref >39.00)
LDL CALC: 130 mg/dL — AB (ref 0–99)
NONHDL: 144.2
TRIGLYCERIDES: 71 mg/dL (ref 0.0–149.0)
Total CHOL/HDL Ratio: 5
VLDL: 14.2 mg/dL (ref 0.0–40.0)

## 2014-06-17 LAB — TSH: TSH: 43.93 u[IU]/mL — ABNORMAL HIGH (ref 0.35–4.50)

## 2014-06-17 MED ORDER — SERTRALINE HCL 100 MG PO TABS: 200.0000 mg | ORAL_TABLET | Freq: Every day | ORAL | Status: DC

## 2014-06-17 MED ORDER — LEVOTHYROXINE SODIUM 88 MCG PO TABS: 176.0000 ug | ORAL_TABLET | Freq: Every day | ORAL | Status: DC

## 2014-06-17 MED ORDER — ALPRAZOLAM 0.5 MG PO TABS: 0.5000 mg | ORAL_TABLET | Freq: Two times a day (BID) | ORAL | Status: DC | PRN

## 2014-06-17 NOTE — Progress Notes (Signed)
Pre visit review using our clinic review tool, if applicable. No additional management support is needed unless otherwise documented below in the visit note. 

## 2014-06-17 NOTE — Patient Instructions (Signed)

## 2014-06-17 NOTE — Telephone Encounter (Signed)
emmi mailed  °

## 2014-06-17 NOTE — Progress Notes (Signed)
Subjective:    Patient ID: Christopher Arias, male    DOB: 1961/05/03, 54 y.o.   MRN: [id number]  HPI  Here for wellness and f/u;  Overall doing ok;  Pt denies CP, worsening SOB, DOE, wheezing, orthopnea, PND, worsening LE edema, palpitations, dizziness or syncope.  Pt denies neurological change such as new headache, facial or extremity weakness.  Pt denies polydipsia, polyuria, or low sugar symptoms. Pt states overall good compliance with treatment and medications, good tolerability, and has been trying to follow lower cholesterol diet.  Pt denies worsening depressive symptoms, suicidal ideation or panic. No fever, night sweats, wt loss, loss of appetite, or other constitutional symptoms.  Pt states good ability with ADL's, has low fall risk, home safety reviewed and adequate, no other significant changes in hearing or vision, and only occasionally active with exercise.  Needs xanax refill.  No current complaints. Denies hyper or hypo thyroid symptoms such as voice, skin or hair change. Past Medical History  Diagnosis Date  . HYPOTHYROIDISM, POST-RADIATION 03/16/2010  . ANTERIOR PITUITARY HYPERFUNCTION 11/23/2009  . HYPERLIPIDEMIA 11/24/2007  . ANXIETY 11/24/2007  . GERD 11/24/2007  . GYNECOMASTIA 11/10/2009   Past Surgical History  Procedure Laterality Date  . Inguinal hernia repair      reports that he has been smoking Cigarettes.  He has been smoking about 0.50 packs per day. He has never used smokeless tobacco. He reports that he drinks alcohol. He reports that he does not use illicit drugs. family history includes COPD in his other; Heart disease in his father and other; Stroke in his other. There is no history of Colon cancer. No Known Allergies Current Outpatient Prescriptions on File Prior to Visit  Medication Sig Dispense Refill  . aspirin 81 MG EC tablet Take 1 tablet (81 mg total) by mouth daily. Swallow whole. 30 tablet 12   No current facility-administered medications on file prior to  visit.   Review of Systems Constitutional: Negative for increased diaphoresis, other activity, appetite or other siginficant weight change  HENT: Negative for worsening hearing loss, ear pain, facial swelling, mouth sores and neck stiffness.   Eyes: Negative for other worsening pain, redness or visual disturbance.  Respiratory: Negative for shortness of breath and wheezing.   Cardiovascular: Negative for chest pain and palpitations.  Gastrointestinal: Negative for diarrhea, blood in stool, abdominal distention or other pain Genitourinary: Negative for hematuria, flank pain or change in urine volume.  Musculoskeletal: Negative for myalgias or other joint complaints.  Skin: Negative for color change and wound.  Neurological: Negative for syncope and numbness. other than noted Hematological: Negative for adenopathy. or other swelling Psychiatric/Behavioral: Negative for hallucinations, self-injury, decreased concentration or other worsening agitation.      Objective:   Physical Exam BP 130/72 mmHg  Pulse 104  Temp(Src) 98.5 F (36.9 C) (Oral)  Ht 5\' 4"  (1.626 m)  Wt 153 lb 12 oz (69.741 kg)  BMI 26.38 kg/m2  SpO2 90% VS noted,  Constitutional: Pt is oriented to person, place, and time. Appears well-developed and well-nourished.  Head: Normocephalic and atraumatic.  Right Ear: External ear normal.  Left Ear: External ear normal.  Nose: Nose normal.  Mouth/Throat: Oropharynx is clear and moist.  Eyes: Conjunctivae and EOM are normal. Pupils are equal, round, and reactive to light.  Neck: Normal range of motion. Neck supple. No JVD present. No tracheal deviation present.  Cardiovascular: Normal rate, regular rhythm, normal heart sounds and intact distal pulses.   Pulmonary/Chest: Effort normal  and breath sounds without rales or wheezing  Abdominal: Soft. Bowel sounds are normal. NT. No HSM  Musculoskeletal: Normal range of motion. Exhibits no edema.  Lymphadenopathy:  Has no  cervical adenopathy.  Neurological: Pt is alert and oriented to person, place, and time. Pt has normal reflexes. No cranial nerve deficit. Motor grossly intact Skin: Skin is warm and dry. No rash noted.  Psychiatric:  Has nervous mood and affect. Behavior is normal.     Assessment & Plan:

## 2014-06-18 NOTE — Assessment & Plan Note (Signed)

## 2014-06-21 ENCOUNTER — Other Ambulatory Visit: Payer: Self-pay | Admitting: Internal Medicine

## 2014-06-21 ENCOUNTER — Encounter: Payer: Self-pay | Admitting: Internal Medicine

## 2014-06-21 ENCOUNTER — Telehealth: Payer: Self-pay | Admitting: Internal Medicine

## 2014-06-21 DIAGNOSIS — E039 Hypothyroidism, unspecified: Secondary | ICD-10-CM

## 2014-06-21 MED ORDER — LEVOTHYROXINE SODIUM 200 MCG PO TABS: 200.0000 ug | ORAL_TABLET | Freq: Every day | ORAL | Status: DC

## 2014-06-21 NOTE — Telephone Encounter (Signed)
Sorry,instead of 2 pills of the 100 mcg strength, there is just one pill of the 200 mcg strength

## 2014-06-22 NOTE — Telephone Encounter (Signed)
Pt contacted and expressed understanding of thyroid rx increase and instruction.

## 2014-09-08 ENCOUNTER — Other Ambulatory Visit: Payer: Self-pay | Admitting: Internal Medicine

## 2015-02-03 ENCOUNTER — Ambulatory Visit (INDEPENDENT_AMBULATORY_CARE_PROVIDER_SITE_OTHER): Payer: BLUE CROSS/BLUE SHIELD | Admitting: Internal Medicine

## 2015-02-03 ENCOUNTER — Encounter: Payer: Self-pay | Admitting: Internal Medicine

## 2015-02-03 ENCOUNTER — Other Ambulatory Visit: Payer: Self-pay | Admitting: Internal Medicine

## 2015-02-03 ENCOUNTER — Other Ambulatory Visit (INDEPENDENT_AMBULATORY_CARE_PROVIDER_SITE_OTHER): Payer: BLUE CROSS/BLUE SHIELD

## 2015-02-03 VITALS — BP 120/70 | HR 109 | Temp 98.4°F | Ht 64.0 in | Wt 141.0 lb

## 2015-02-03 DIAGNOSIS — E039 Hypothyroidism, unspecified: Secondary | ICD-10-CM

## 2015-02-03 DIAGNOSIS — R000 Tachycardia, unspecified: Secondary | ICD-10-CM

## 2015-02-03 DIAGNOSIS — F411 Generalized anxiety disorder: Secondary | ICD-10-CM | POA: Diagnosis not present

## 2015-02-03 DIAGNOSIS — I471 Supraventricular tachycardia: Secondary | ICD-10-CM | POA: Diagnosis not present

## 2015-02-03 LAB — CBC WITH DIFFERENTIAL/PLATELET
Basophils Absolute: 0.1 10*3/uL (ref 0.0–0.1)
Basophils Relative: 1 % (ref 0.0–3.0)
EOS PCT: 1.5 % (ref 0.0–5.0)
Eosinophils Absolute: 0.1 10*3/uL (ref 0.0–0.7)
HEMATOCRIT: 48.6 % (ref 39.0–52.0)
HEMOGLOBIN: 16.3 g/dL (ref 13.0–17.0)
LYMPHS PCT: 15.4 % (ref 12.0–46.0)
Lymphs Abs: 1.5 10*3/uL (ref 0.7–4.0)
MCHC: 33.5 g/dL (ref 30.0–36.0)
MCV: 87.2 fl (ref 78.0–100.0)
MONO ABS: 0.6 10*3/uL (ref 0.1–1.0)
MONOS PCT: 6.2 % (ref 3.0–12.0)
Neutro Abs: 7.6 10*3/uL (ref 1.4–7.7)
Neutrophils Relative %: 75.9 % (ref 43.0–77.0)
Platelets: 205 10*3/uL (ref 150.0–400.0)
RBC: 5.58 Mil/uL (ref 4.22–5.81)
RDW: 13.5 % (ref 11.5–15.5)
WBC: 10.1 10*3/uL (ref 4.0–10.5)

## 2015-02-03 LAB — BASIC METABOLIC PANEL
BUN: 14 mg/dL (ref 6–23)
CO2: 26 mEq/L (ref 19–32)
Calcium: 9 mg/dL (ref 8.4–10.5)
Chloride: 110 mEq/L (ref 96–112)
Creatinine, Ser: 0.85 mg/dL (ref 0.40–1.50)
GFR: 99.91 mL/min (ref >60.00)
Glucose, Bld: 103 mg/dL — ABNORMAL HIGH (ref 70–99)
POTASSIUM: 4.7 meq/L (ref 3.5–5.1)
SODIUM: 144 meq/L (ref 135–145)

## 2015-02-03 LAB — T4, FREE: FREE T4: 1.8 ng/dL — AB (ref 0.60–1.60)

## 2015-02-03 LAB — TSH: TSH: 0.14 u[IU]/mL — AB (ref 0.35–4.50)

## 2015-02-03 MED ORDER — LEVOTHYROXINE SODIUM 88 MCG PO TABS: ORAL_TABLET | ORAL | Status: DC

## 2015-02-03 MED ORDER — CLONAZEPAM 0.5 MG PO TABS: 0.5000 mg | ORAL_TABLET | Freq: Two times a day (BID) | ORAL | Status: DC | PRN

## 2015-02-03 NOTE — Assessment & Plan Note (Addendum)
Etiology unclear, but anxiety and thyroid need to be addressed as above, no other overt issue such as fever, pain, low BP, dehydration, anemia, hypoxia;  Cant completely r/o pheo or POTS but doubt for now.  For eval as above, but is not improved will consider card cd 180 qd addition, echo and cardiology evaluation; ECG reviewed as per emr  Note:  Total time for pt hx, exam, review of record with pt in the room, determination of diagnoses and plan for further eval and tx is > 40 min, with over 50% spent in coordination and counseling of patient

## 2015-02-03 NOTE — Progress Notes (Signed)
Pre visit review using our clinic review tool, if applicable. No additional management support is needed unless otherwise documented below in the visit note. 

## 2015-02-03 NOTE — Assessment & Plan Note (Signed)
Fo f/u lab eval today, consider decreased thyroid med

## 2015-02-03 NOTE — Progress Notes (Signed)
Subjective:    Patient ID: Christopher Arias, male    DOB: 12/22/1960, 53 y.o.   MRN: [id number]  HPI  Here with c/o palpitations/faster heart rate since feb-mar 2016, mild, intermittent and seemed assoc with wife having stroke and siezure after brain tumor surgury with memory loss and disabled since that time  Pt denies chest pain, increased sob or doe, wheezing, orthopnea, PND, increased LE swelling, dizziness or syncope.  Has been out of anxiety meds for several months, not clear if associated directly. Thinks about wife constantly when at home, actually feels better when he is at work.  Has been married 28 yrs.  Also had thyroid  Med adjusted up jan 2016 but not f/u since then.  /Overall good compliance with treatment, and good medicine tolerability. Still smokes occasionally.  Walks 6 miles a day per [employer] including walking at work.  Has new dog with 1-2 mile walk after work daily.  No recent fever, pain, dehydration, bleeding or anemia, or hypoxia.  Past Medical History  Diagnosis Date  . HYPOTHYROIDISM, POST-RADIATION 03/16/2010  . ANTERIOR PITUITARY HYPERFUNCTION 11/23/2009  . HYPERLIPIDEMIA 11/24/2007  . ANXIETY 11/24/2007  . GERD 11/24/2007  . GYNECOMASTIA 11/10/2009   Past Surgical History  Procedure Laterality Date  . Inguinal hernia repair      reports that he has been smoking Cigarettes.  He has been smoking about 0.50 packs per day. He has never used smokeless tobacco. He reports that he drinks alcohol. He reports that he does not use illicit drugs. family history includes COPD in his other; Heart disease in his father and other; Stroke in his other. There is no history of Colon cancer. No Known Allergies Current Outpatient Prescriptions on File Prior to Visit  Medication Sig Dispense Refill  . ALPRAZolam (XANAX) 0.5 MG tablet Take 1 tablet (0.5 mg total) by mouth 2 (two) times daily as needed. for anxiety 60 tablet 5  . aspirin 81 MG EC tablet Take 1 tablet (81 mg total) by mouth  daily. Swallow whole. 30 tablet 12  . levothyroxine (SYNTHROID) 200 MCG tablet Take 1 tablet (200 mcg total) by mouth daily before breakfast. 90 tablet 3  . levothyroxine (SYNTHROID, LEVOTHROID) 88 MCG tablet TAKE 2 TABLETS EVERY DAY 180 tablet 3  . sertraline (ZOLOFT) 100 MG tablet Take 2 tablets (200 mg total) by mouth daily. 180 tablet 3   No current facility-administered medications on file prior to visit.   Review of Systems  Constitutional: Negative for unusual diaphoresis or night sweats HENT: Negative for ringing in ear or discharge Eyes: Negative for double vision or worsening visual disturbance.  Respiratory: Negative for choking and stridor.   Gastrointestinal: Negative for vomiting or other signifcant bowel change Genitourinary: Negative for hematuria or change in urine volume.  Musculoskeletal: Negative for other MSK pain or swelling Skin: Negative for color change and worsening wound.  Neurological: Negative for tremors and numbness other than noted  Psychiatric/Behavioral: Negative for decreased concentration or agitation other than above       Objective:   Physical Exam BP 120/70 mmHg  Pulse 109  Temp(Src) 98.4 F (36.9 C) (Oral)  Ht 5\' 4"  (1.626 m)  Wt 141 lb (63.957 kg)  BMI 24.19 kg/m2  SpO2 94% VS noted, nervous, tense Constitutional: Pt appears in no significant distress HENT: Head: NCAT.  Right Ear: External ear normal.  Left Ear: External ear normal.  Eyes: . Pupils are equal, round, and reactive to light. Conjunctivae and EOM are  normal Neck: Normal range of motion. Neck supple.  Cardiovascular: Normal rate and regular rhythm.   Pulmonary/Chest: Effort normal and breath sounds without rales or wheezing.  Abd:  Soft, NT, ND, + BS Neurological: Pt is alert. Not confused , motor grossly intact Skin: Skin is warm. No rash, no LE edema Psychiatric: Pt behavior is normal. No agitation but "jumpy" on edge.   ECG reviewed as per emr    Assessment & Plan:

## 2015-02-03 NOTE — Assessment & Plan Note (Signed)
Ok to change the xanax to klonopin for better control,  to f/u any worsening symptoms or concerns

## 2015-02-03 NOTE — Patient Instructions (Addendum)
Ok to stop the xanax  Please take all new medication as prescribed - the klonopin twice per day as needed  Please continue all other medications as before, and refills have been done if requested.  Please have the pharmacy call with any other refills you may need.  Please continue your efforts at being more active, low cholesterol diet, and weight control.  Please keep your appointments with your specialists as you may have planned  Please go to the LAB in the Basement (turn left off the elevator) for the tests to be done today  You will be contacted by phone if any changes need to be made immediately.  Otherwise, you will receive a letter about your results with an explanation, but please check with MyChart first.  If the klonopin does not help, and the thyroid medication does not need adjusting, we may need to add cardizem CD 180 per day, as well as Echocardiogram and Cardiology referral  Please return in 4 months, or sooner if needed, with Lab testing done 3-5 days before

## 2015-06-09 ENCOUNTER — Ambulatory Visit: Payer: BLUE CROSS/BLUE SHIELD | Admitting: Internal Medicine

## 2015-06-28 ENCOUNTER — Telehealth: Payer: Self-pay | Admitting: *Deleted

## 2015-06-28 MED ORDER — SERTRALINE HCL 100 MG PO TABS
200.0000 mg | ORAL_TABLET | Freq: Every day | ORAL | Status: DC
Start: 2015-06-28 — End: 2015-07-28

## 2015-06-28 NOTE — Telephone Encounter (Signed)
Received call pt wife state she spoke with someone yesterday [facility], but no one has called back to inform if med was sent. Inform wife per chart no msg has been taking., but [facility] he is actually due for her yearly physical.../lmb

## 2015-06-30 ENCOUNTER — Ambulatory Visit (INDEPENDENT_AMBULATORY_CARE_PROVIDER_SITE_OTHER): Payer: BLUE CROSS/BLUE SHIELD | Admitting: Family

## 2015-06-30 ENCOUNTER — Other Ambulatory Visit (INDEPENDENT_AMBULATORY_CARE_PROVIDER_SITE_OTHER): Payer: BLUE CROSS/BLUE SHIELD

## 2015-06-30 ENCOUNTER — Encounter: Payer: Self-pay | Admitting: Family

## 2015-06-30 VITALS — BP 130/62 | HR 105 | Temp 97.6°F | Resp 16 | Ht 64.0 in | Wt 141.0 lb

## 2015-06-30 DIAGNOSIS — R05 Cough: Secondary | ICD-10-CM | POA: Diagnosis not present

## 2015-06-30 DIAGNOSIS — R059 Cough, unspecified: Secondary | ICD-10-CM

## 2015-06-30 DIAGNOSIS — R000 Tachycardia, unspecified: Secondary | ICD-10-CM | POA: Diagnosis not present

## 2015-06-30 LAB — TSH: TSH: 0.1 u[IU]/mL — AB (ref 0.35–4.50)

## 2015-06-30 LAB — T4: T4, Total: 10 ug/dL (ref 4.5–12.0)

## 2015-06-30 MED ORDER — ATENOLOL 25 MG PO TABS: 25.0000 mg | ORAL_TABLET | Freq: Every day | ORAL | Status: DC

## 2015-06-30 MED ORDER — HYDROCODONE-HOMATROPINE 5-1.5 MG/5ML PO SYRP: 5.0000 mL | ORAL_SOLUTION | Freq: Three times a day (TID) | ORAL | Status: DC | PRN

## 2015-06-30 NOTE — Progress Notes (Signed)
Subjective:    Patient ID: Christopher Arias, male    DOB: 05/16/1961, 55 y.o.   MRN: [id number]  Chief Complaint  Patient presents with  . Cough    x1 week, nagging cough, not other sxs    HPI:  Christopher Arias is a 55 y.o. male who  has a past medical history of HYPOTHYROIDISM, POST-RADIATION (03/16/2010); ANTERIOR PITUITARY HYPERFUNCTION (11/23/2009); HYPERLIPIDEMIA (11/24/2007); ANXIETY (11/24/2007); GERD (11/24/2007); and GYNECOMASTIA (11/10/2009). and presents today for an acute office visit.  1.) Cough - This is a new problem. Associated symptom of cough has been going on for about 1 week. Denies fevers or other symptoms. Modifying factors include Muciniex and Nyquil . Described as dry cough with severity to disturb his sleep. Denies recent antibiotics.   2.) Tachycardia - previously noted to have tachycardia and notes continued feelings of his heart beating fast with no other symptoms including shortness of breath or chest pain. Previous TSH was noted to be low, however patient indicates that his thyroid function continues to be labile despite medication changes.  No Known Allergies   Current Outpatient Prescriptions on File Prior to Visit  Medication Sig Dispense Refill  . aspirin 81 MG EC tablet Take 1 tablet (81 mg total) by mouth daily. Swallow whole. 30 tablet 12  . clonazePAM (KLONOPIN) 0.5 MG tablet Take 1 tablet (0.5 mg total) by mouth 2 (two) times daily as needed for anxiety. 60 tablet 5  . levothyroxine (SYNTHROID, LEVOTHROID) 88 MCG tablet 2 tabs by mouth per day 180 tablet 3  . sertraline (ZOLOFT) 100 MG tablet Take 2 tablets (200 mg total) by mouth daily. Overdue for yearly physical must see md for refills 60 tablet 0   No current facility-administered medications on file prior to visit.    Review of Systems  Constitutional: Negative for fever and chills.  HENT: Negative for congestion, sinus pressure and sore throat.   Respiratory: Positive for cough. Negative for chest  tightness and shortness of breath.   Neurological: Negative for headaches.      Objective:    BP 130/62 mmHg  Pulse 105  Temp(Src) 97.6 F (36.4 C) (Oral)  Resp 16  Ht 5\' 4"  (1.626 m)  Wt 141 lb (63.957 kg)  BMI 24.19 kg/m2  SpO2 94% Nursing note and vital signs reviewed.  Physical Exam  Constitutional: He is oriented to person, place, and time. He appears well-developed and well-nourished. No distress.  HENT:  Right Ear: Hearing, tympanic membrane, external ear and ear canal normal.  Left Ear: Hearing, tympanic membrane, external ear and ear canal normal.  Nose: Nose normal. Right sinus exhibits no maxillary sinus tenderness and no frontal sinus tenderness. Left sinus exhibits no maxillary sinus tenderness and no frontal sinus tenderness.  Mouth/Throat: Uvula is midline, oropharynx is clear and moist and mucous membranes are normal.  Cardiovascular: Normal rate, regular rhythm, normal heart sounds and intact distal pulses.   Pulmonary/Chest: Effort normal and breath sounds normal.  Neurological: He is alert and oriented to person, place, and time.  Skin: Skin is warm and dry.  Psychiatric: He has a normal mood and affect. His behavior is normal. Judgment and thought content normal.       Assessment & Plan:   Problem List Items Addressed This Visit      Cardiovascular and Mediastinum   Inappropriate sinus tachycardia ([place])    Continues to experience sinus tachycardia. Previous EKG reviewed and showed sinus tachycardia. TSH was noted to be low  at 0.18. Obtain TSH, T T3, and T4. Concern for possible thyroid related tachycardia, although patient notes despite thyroid control he is continued to have tachycardia. Continue current dosage of levothyroxine pending TSH results. Start atenolol. Follow-up with PCP in one month or sooner if symptoms worsen.      Relevant Medications   atenolol (TENORMIN) 25 MG tablet   Other Relevant Orders   TSH   T3   T4     Other   Cough -  Primary    Symptoms and exam consistent with viral respiratory infection. Start Hycodan as needed for cough and sleep. Continue over-the-counter medications as needed for symptom relief and supportive care. Follow-up if symptoms worsen or fail to improve with conservative treatment.      Relevant Medications   HYDROcodone-homatropine (HYCODAN) 5-1.5 MG/5ML syrup   Tachycardia   Relevant Medications   atenolol (TENORMIN) 25 MG tablet

## 2015-06-30 NOTE — Assessment & Plan Note (Signed)
Continues to experience sinus tachycardia. Previous EKG reviewed and showed sinus tachycardia. TSH was noted to be low at 0.18. Obtain TSH, T T3, and T4. Concern for possible thyroid related tachycardia, although patient notes despite thyroid control he is continued to have tachycardia. Continue current dosage of levothyroxine pending TSH results. Start atenolol. Follow-up with PCP in one month or sooner if symptoms worsen.

## 2015-06-30 NOTE — Patient Instructions (Signed)
Thank you for choosing [facility].  Summary/Instructions:  Your prescription(s) have been submitted to your pharmacy or been printed and provided for you. Please take as directed and contact our office if you believe you are having problem(s) with the medication(s) or have any questions.  Please stop by the lab on the basement level of the building for your blood work. Your results will be released to MyChart (or called to you) after review, usually within 72 hours after test completion. If any changes need to be made, you will be notified at that same time.  If your symptoms worsen or fail to improve, please contact our office for further instruction, or in case of emergency go directly to the emergency room at the closest medical facility.   General Recommendations:    Please drink plenty of fluids.  Get plenty of rest   Sleep in humidified air  Use saline nasal sprays  Netti pot   OTC Medications:  Decongestants - helps relieve congestion   Flonase (generic fluticasone) or Nasacort (generic triamcinolone) - please make sure to use the "cross-over" technique at a 45 degree angle towards the opposite eye as opposed to straight up the nasal passageway.   Sudafed (generic pseudoephedrine - Note this is the one that is available behind the pharmacy counter); Products with phenylephrine (-PE) may also be used but is often not as effective as pseudoephedrine.   If you have HIGH BLOOD PRESSURE - Coricidin HBP; AVOID any product that is -D as this contains pseudoephedrine which may increase your blood pressure.  Afrin (oxymetazoline) every 6-8 hours for up to 3 days.   Allergies - helps relieve runny nose, itchy eyes and sneezing   Claritin (generic loratidine), Allegra (fexofenidine), or Zyrtec (generic cyrterizine) for runny nose. These medications should not cause drowsiness.  Note - Benadryl (generic diphenhydramine) may be used however may cause drowsiness  Cough  -   Delsym or Robitussin (generic dextromethorphan)  Expectorants - helps loosen mucus to ease removal   Mucinex (generic guaifenesin) as directed on the package.  Headaches / General Aches   Tylenol (generic acetaminophen) - DO NOT EXCEED 3 grams (3,000 mg) in a 24 hour time period  Advil/Motrin (generic ibuprofen)   Sore Throat -   Salt water gargle   Chloraseptic (generic benzocaine) spray or lozenges / Sucrets (generic dyclonine)     

## 2015-06-30 NOTE — Assessment & Plan Note (Signed)
Symptoms and exam consistent with viral respiratory infection. Start Hycodan as needed for cough and sleep. Continue over-the-counter medications as needed for symptom relief and supportive care. Follow-up if symptoms worsen or fail to improve with conservative treatment.

## 2015-06-30 NOTE — Progress Notes (Signed)
Pre visit review using our clinic review tool, if applicable. No additional management support is needed unless otherwise documented below in the visit note. 

## 2015-07-03 ENCOUNTER — Telehealth: Payer: Self-pay | Admitting: Family

## 2015-07-03 NOTE — Telephone Encounter (Signed)
Please inform patient that his thyroid-stimulating hormone or TSH is low, indicating his thyroid is overactive with current dosage of the medication. Please confirm he is taking to 88 g tablets daily. His T3 and T4 were normal. We cannot rule out this as an underlying cause of his heart palpitations. If he would like to try the atenolol, that is fine. I would like to decrease his levothyroxine pending his current dosage of medication.

## 2015-07-05 MED ORDER — LEVOTHYROXINE SODIUM 150 MCG PO TABS: 150.0000 ug | ORAL_TABLET | Freq: Every day | ORAL | Status: DC

## 2015-07-05 NOTE — Telephone Encounter (Signed)
Medication sent to pharmacy  

## 2015-07-05 NOTE — Telephone Encounter (Signed)
Pt is aware of results. He takes 2 levothyroxine 88 mcg daily. Will you please send in the updated dose that you want pt to take daily.

## 2015-07-06 NOTE — Telephone Encounter (Signed)
Pt aware.

## 2015-07-28 ENCOUNTER — Telehealth: Payer: Self-pay | Admitting: Internal Medicine

## 2015-07-28 MED ORDER — SERTRALINE HCL 100 MG PO TABS: 200.0000 mg | ORAL_TABLET | Freq: Every day | ORAL | Status: DC

## 2015-07-28 NOTE — Telephone Encounter (Signed)
[place] for 10 day rx - done erx

## 2015-07-28 NOTE — Telephone Encounter (Signed)
Pt need refill for sertraline (ZOLOFT) 100 MG tablet, pt schedule to see [facility] on Monday 07/31/15 because there is a note saying that he needs an appt before he can get this med refill (dr. Jenny Reichmann is not here next week). Please advise, can he get something call in until he come in to see Plot on Monday? He is out of this med

## 2015-07-28 NOTE — Telephone Encounter (Signed)
Please call once complete at [phone]

## 2015-07-28 NOTE — Telephone Encounter (Signed)
Pt's spouse advised 

## 2015-07-31 ENCOUNTER — Ambulatory Visit: Payer: BLUE CROSS/BLUE SHIELD | Admitting: Internal Medicine

## 2015-08-04 ENCOUNTER — Other Ambulatory Visit: Payer: Self-pay | Admitting: Internal Medicine

## 2015-08-08 NOTE — Telephone Encounter (Signed)
[facility]

## 2015-08-24 ENCOUNTER — Telehealth: Payer: Self-pay | Admitting: Internal Medicine

## 2015-08-24 NOTE — Telephone Encounter (Signed)
Pt came by office thinking his CPE was this afternoon.  It is not until next month.  Pt stated while here he needs a refill on his Sertraline (Zoloft) 100mg .  [facility]

## 2015-08-24 NOTE — Telephone Encounter (Signed)
[place]

## 2015-08-24 NOTE — Telephone Encounter (Signed)
[place] with me  To Corinne to help please

## 2015-08-25 NOTE — Telephone Encounter (Signed)
Called and left patient a message to give [place] a call back. Patient is past due for his yearly physical and needs to be seen before any further refills.

## 2015-08-25 NOTE — Telephone Encounter (Signed)
Pt's wife called back to follow up on this   [facility] [address]

## 2015-08-29 ENCOUNTER — Telehealth: Payer: Self-pay | Admitting: Internal Medicine

## 2015-08-29 MED ORDER — SERTRALINE HCL 100 MG PO TABS
200.0000 mg | ORAL_TABLET | Freq: Every day | ORAL | Status: DC
Start: 2015-08-29 — End: 2015-09-13

## 2015-08-29 NOTE — Telephone Encounter (Signed)
Sent prescription in for 10 days, patient is past due for yearly physical and must keep appointment in April for further refills

## 2015-08-29 NOTE — Telephone Encounter (Signed)
Pt request refill for sertraline (ZOLOFT) 100 MG tablet [facility] [address]. Pt has an appt on 09/14/15.

## 2015-09-08 ENCOUNTER — Other Ambulatory Visit: Payer: Self-pay | Admitting: Internal Medicine

## 2015-09-11 NOTE — Telephone Encounter (Signed)
Please advise, thanks.

## 2015-09-12 ENCOUNTER — Telehealth: Payer: Self-pay | Admitting: Internal Medicine

## 2015-09-12 NOTE — Telephone Encounter (Signed)
Done hardcopy to Corinne  

## 2015-09-12 NOTE — Telephone Encounter (Signed)
Medication has been sent to pharmacy.  °

## 2015-09-12 NOTE — Telephone Encounter (Signed)
Patients wife called for refill on sertraline (ZOLOFT) 100 MG tablet [id number] levothyroxine (SYNTHROID, LEVOTHROID) 150 MCG tablet [id number]  [facility] [address]  She cancelled his CPE appointment and did not reschedule at this point. Advised that this may be needed for further refills

## 2015-09-13 MED ORDER — SERTRALINE HCL 100 MG PO TABS: 200.0000 mg | ORAL_TABLET | Freq: Every day | ORAL | Status: DC

## 2015-09-13 MED ORDER — LEVOTHYROXINE SODIUM 150 MCG PO TABS: 150.0000 ug | ORAL_TABLET | Freq: Every day | ORAL | Status: DC

## 2015-09-13 NOTE — Telephone Encounter (Signed)
[place] for 2 mo meds, done erx  Please make ROV or CPX for further refills

## 2015-09-13 NOTE — Telephone Encounter (Signed)
Please advise can we refill for this patient without a yearly physical

## 2015-09-14 ENCOUNTER — Encounter: Payer: BLUE CROSS/BLUE SHIELD | Admitting: Internal Medicine

## 2015-10-06 ENCOUNTER — Other Ambulatory Visit: Payer: Self-pay

## 2015-10-06 MED ORDER — LEVOTHYROXINE SODIUM 150 MCG PO TABS: 150.0000 ug | ORAL_TABLET | Freq: Every day | ORAL | Status: DC

## 2015-11-06 ENCOUNTER — Other Ambulatory Visit: Payer: Self-pay

## 2015-11-06 MED ORDER — SERTRALINE HCL 100 MG PO TABS: 200.0000 mg | ORAL_TABLET | Freq: Every day | ORAL | Status: DC

## 2015-11-27 ENCOUNTER — Other Ambulatory Visit: Payer: Self-pay | Admitting: Internal Medicine

## 2015-11-28 NOTE — Telephone Encounter (Signed)
Done hardcopy to Corinne  

## 2015-11-28 NOTE — Telephone Encounter (Signed)
Medication refill sent to pharmacy  

## 2016-02-01 ENCOUNTER — Other Ambulatory Visit: Payer: Self-pay | Admitting: Internal Medicine

## 2016-02-01 NOTE — Telephone Encounter (Signed)
[facility].../lmb 

## 2016-02-01 NOTE — Telephone Encounter (Signed)
Done hardcopy to Corinne  

## 2016-02-19 ENCOUNTER — Other Ambulatory Visit: Payer: Self-pay | Admitting: *Deleted

## 2016-02-19 MED ORDER — SERTRALINE HCL 100 MG PO TABS
200.0000 mg | ORAL_TABLET | Freq: Every day | ORAL | 1 refills | Status: DC
Start: 2016-02-19 — End: 2016-08-28

## 2016-03-08 ENCOUNTER — Other Ambulatory Visit: Payer: Self-pay | Admitting: Internal Medicine

## 2016-03-12 ENCOUNTER — Other Ambulatory Visit: Payer: Self-pay | Admitting: Internal Medicine

## 2016-03-12 NOTE — Telephone Encounter (Signed)
Done hardcopy to Corinne  

## 2016-03-13 ENCOUNTER — Other Ambulatory Visit: Payer: Self-pay | Admitting: Internal Medicine

## 2016-03-13 NOTE — Telephone Encounter (Signed)
Medication has been faxed to pharmacy 

## 2016-03-19 DIAGNOSIS — H9041 Sensorineural hearing loss, unilateral, right ear, with unrestricted hearing on the contralateral side: Secondary | ICD-10-CM | POA: Diagnosis not present

## 2016-03-19 DIAGNOSIS — H6121 Impacted cerumen, right ear: Secondary | ICD-10-CM | POA: Diagnosis not present

## 2016-03-19 DIAGNOSIS — H903 Sensorineural hearing loss, bilateral: Secondary | ICD-10-CM | POA: Diagnosis not present

## 2016-04-28 ENCOUNTER — Other Ambulatory Visit: Payer: Self-pay | Admitting: Internal Medicine

## 2016-07-03 ENCOUNTER — Other Ambulatory Visit: Payer: Self-pay | Admitting: Internal Medicine

## 2016-07-03 NOTE — Telephone Encounter (Signed)
Done hardcopy to Corinne  

## 2016-07-04 ENCOUNTER — Other Ambulatory Visit: Payer: Self-pay | Admitting: Family

## 2016-07-04 DIAGNOSIS — R000 Tachycardia, unspecified: Secondary | ICD-10-CM

## 2016-07-04 NOTE — Telephone Encounter (Signed)
faxed

## 2016-08-12 ENCOUNTER — Other Ambulatory Visit: Payer: Self-pay | Admitting: Internal Medicine

## 2016-08-13 NOTE — Telephone Encounter (Signed)
Pt last seen 2016  Please make ROV for further refills

## 2016-08-28 ENCOUNTER — Encounter: Payer: Self-pay | Admitting: Internal Medicine

## 2016-08-28 ENCOUNTER — Other Ambulatory Visit (INDEPENDENT_AMBULATORY_CARE_PROVIDER_SITE_OTHER): Payer: BLUE CROSS/BLUE SHIELD

## 2016-08-28 ENCOUNTER — Ambulatory Visit (INDEPENDENT_AMBULATORY_CARE_PROVIDER_SITE_OTHER): Payer: BLUE CROSS/BLUE SHIELD | Admitting: Internal Medicine

## 2016-08-28 VITALS — BP 130/60 | HR 98 | Temp 98.5°F | Ht 64.0 in | Wt 155.0 lb

## 2016-08-28 DIAGNOSIS — I4711 Inappropriate sinus tachycardia, so stated: Secondary | ICD-10-CM

## 2016-08-28 DIAGNOSIS — Z1159 Encounter for screening for other viral diseases: Secondary | ICD-10-CM | POA: Diagnosis not present

## 2016-08-28 DIAGNOSIS — G4700 Insomnia, unspecified: Secondary | ICD-10-CM

## 2016-08-28 DIAGNOSIS — E039 Hypothyroidism, unspecified: Secondary | ICD-10-CM

## 2016-08-28 DIAGNOSIS — Z0000 Encounter for general adult medical examination without abnormal findings: Secondary | ICD-10-CM | POA: Diagnosis not present

## 2016-08-28 DIAGNOSIS — R000 Tachycardia, unspecified: Secondary | ICD-10-CM | POA: Diagnosis not present

## 2016-08-28 LAB — URINALYSIS, ROUTINE W REFLEX MICROSCOPIC
Bilirubin Urine: NEGATIVE
Hgb urine dipstick: NEGATIVE
KETONES UR: NEGATIVE
LEUKOCYTES UA: NEGATIVE
NITRITE: NEGATIVE
RBC / HPF: NONE SEEN (ref 0–?)
SPECIFIC GRAVITY, URINE: 1.02 (ref 1.000–1.030)
Total Protein, Urine: NEGATIVE
URINE GLUCOSE: NEGATIVE
Urobilinogen, UA: 0.2 (ref 0.0–1.0)
WBC, UA: NONE SEEN (ref 0–?)
pH: 6.5 (ref 5.0–8.0)

## 2016-08-28 LAB — CBC WITH DIFFERENTIAL/PLATELET
BASOS ABS: 0.1 10*3/uL (ref 0.0–0.1)
Basophils Relative: 1 % (ref 0.0–3.0)
EOS ABS: 0.1 10*3/uL (ref 0.0–0.7)
Eosinophils Relative: 0.7 % (ref 0.0–5.0)
HCT: 46.1 % (ref 39.0–52.0)
Hemoglobin: 15.4 g/dL (ref 13.0–17.0)
LYMPHS ABS: 1.2 10*3/uL (ref 0.7–4.0)
LYMPHS PCT: 13.9 % (ref 12.0–46.0)
MCHC: 33.5 g/dL (ref 30.0–36.0)
MCV: 87.6 fl (ref 78.0–100.0)
Monocytes Absolute: 0.6 10*3/uL (ref 0.1–1.0)
Monocytes Relative: 6.9 % (ref 3.0–12.0)
NEUTROS PCT: 77.5 % — AB (ref 43.0–77.0)
Neutro Abs: 6.9 10*3/uL (ref 1.4–7.7)
PLATELETS: 198 10*3/uL (ref 150.0–400.0)
RBC: 5.26 Mil/uL (ref 4.22–5.81)
RDW: 13.9 % (ref 11.5–15.5)
WBC: 8.8 10*3/uL (ref 4.0–10.5)

## 2016-08-28 LAB — BASIC METABOLIC PANEL
BUN: 16 mg/dL (ref 6–23)
CHLORIDE: 107 meq/L (ref 96–112)
CO2: 27 meq/L (ref 19–32)
CREATININE: 0.94 mg/dL (ref 0.40–1.50)
Calcium: 9.4 mg/dL (ref 8.4–10.5)
GFR: 88.44 mL/min (ref >60.00)
Glucose, Bld: 85 mg/dL (ref 70–99)
Potassium: 4.5 mEq/L (ref 3.5–5.1)
Sodium: 141 mEq/L (ref 135–145)

## 2016-08-28 LAB — PSA: PSA: 3.18 ng/mL (ref 0.10–4.00)

## 2016-08-28 LAB — LIPID PANEL
CHOLESTEROL: 191 mg/dL (ref 0–200)
HDL: 33.4 mg/dL — AB (ref >39.00)
LDL Cholesterol: 118 mg/dL — ABNORMAL HIGH (ref 0–99)
NONHDL: 157.33
TRIGLYCERIDES: 196 mg/dL — AB (ref 0.0–149.0)
Total CHOL/HDL Ratio: 6
VLDL: 39.2 mg/dL (ref 0.0–40.0)

## 2016-08-28 LAB — HEPATIC FUNCTION PANEL
ALK PHOS: 61 U/L (ref 39–117)
ALT: 17 U/L (ref 0–53)
AST: 13 U/L (ref 0–37)
Albumin: 4.5 g/dL (ref 3.5–5.2)
BILIRUBIN DIRECT: 0.1 mg/dL (ref 0.0–0.3)
TOTAL PROTEIN: 6.2 g/dL (ref 6.0–8.3)
Total Bilirubin: 0.3 mg/dL (ref 0.2–1.2)

## 2016-08-28 LAB — HIV ANTIBODY (ROUTINE TESTING W REFLEX): HIV 1&2 Ab, 4th Generation: NONREACTIVE

## 2016-08-28 LAB — T4, FREE: Free T4: 1.37 ng/dL (ref 0.60–1.60)

## 2016-08-28 LAB — TSH: TSH: 0.04 u[IU]/mL — ABNORMAL LOW (ref 0.35–4.50)

## 2016-08-28 MED ORDER — LEVOTHYROXINE SODIUM 88 MCG PO TABS: 176.0000 ug | ORAL_TABLET | Freq: Every day | ORAL | 3 refills | Status: DC

## 2016-08-28 MED ORDER — CLONAZEPAM 1 MG PO TABS: ORAL_TABLET | ORAL | 2 refills | Status: DC

## 2016-08-28 MED ORDER — ZOLPIDEM TARTRATE 5 MG PO TABS: 5.0000 mg | ORAL_TABLET | Freq: Every evening | ORAL | 5 refills | Status: DC | PRN

## 2016-08-28 MED ORDER — ATENOLOL 25 MG PO TABS: 25.0000 mg | ORAL_TABLET | Freq: Every day | ORAL | 3 refills | Status: DC

## 2016-08-28 MED ORDER — SERTRALINE HCL 100 MG PO TABS: 200.0000 mg | ORAL_TABLET | Freq: Every day | ORAL | 3 refills | Status: DC

## 2016-08-28 NOTE — Assessment & Plan Note (Signed)
Stable on low dose BB. 

## 2016-08-28 NOTE — Assessment & Plan Note (Signed)
Mild persistent, for ambien refill,  to f/u any worsening symptoms or concerns

## 2016-08-28 NOTE — Progress Notes (Signed)
Pre visit review using our clinic review tool, if applicable. No additional management support is needed unless otherwise documented below in the visit note. 

## 2016-08-28 NOTE — Assessment & Plan Note (Signed)
asympt, f/u lab today

## 2016-08-28 NOTE — Patient Instructions (Signed)
Please take all new medication as prescribed - the ambien for sleep as needed  Please continue all other medications as before, and refills have been done if requested.  Please have the pharmacy call with any other refills you may need.  Please continue your efforts at being more active, low cholesterol diet, and weight control.  You are otherwise up to date with prevention measures today.  Please keep your appointments with your specialists as you may have planned  Please go to the LAB in the Basement (turn left off the elevator) for the tests to be done today  You will be contacted by phone if any changes need to be made immediately.  Otherwise, you will receive a letter about your results with an explanation, but please check with MyChart first.  Please remember to sign up for MyChart if you have not done so, as this will be important to you in the future with finding out test results, communicating by private email, and scheduling acute appointments online when needed.  Please return in 1 year for your yearly visit, or sooner if needed, with Lab testing done 3-5 days before`
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
## 2016-08-28 NOTE — Assessment & Plan Note (Signed)

## 2016-08-29 ENCOUNTER — Other Ambulatory Visit: Payer: Self-pay | Admitting: Internal Medicine

## 2016-08-29 ENCOUNTER — Encounter: Payer: Self-pay | Admitting: Internal Medicine

## 2016-08-29 ENCOUNTER — Telehealth: Payer: Self-pay

## 2016-08-29 LAB — HEPATITIS C ANTIBODY: HCV Ab: NEGATIVE

## 2016-08-29 MED ORDER — ATORVASTATIN CALCIUM 10 MG PO TABS: 10.0000 mg | ORAL_TABLET | Freq: Every day | ORAL | 3 refills | Status: DC

## 2016-08-29 MED ORDER — LEVOTHYROXINE SODIUM 150 MCG PO TABS: ORAL_TABLET | ORAL | 3 refills | Status: DC

## 2016-08-29 NOTE — Telephone Encounter (Signed)
Patient was informed of results and expressed understanding.

## 2016-08-29 NOTE — Telephone Encounter (Signed)
-----   Message from Biagio Borg, MD sent at 08/29/2016  8:14 AM EDT ----- Left message on MyChart, pt to cont same tx except  The test results show that your current treatment is OK, except the Thyroid test is still abnormal, meaning that you have are taking a bit too much thyroid medication.  Also, the LDL cholesterol is elevated mild to moderate.  Please change the thyroid medication to taking levothyroxine 150 mcg per day ( one pill only per day), and also lipitor 10 mg per day to help the cholesterol. .    There is no other need for change of treatment or further evaluation based on these results, at this time.    Solymar Grace to please inform pt, I will do rx x 2

## 2016-12-08 ENCOUNTER — Other Ambulatory Visit: Payer: Self-pay | Admitting: Internal Medicine

## 2016-12-10 NOTE — Telephone Encounter (Signed)
Done hardcopy to [place]  

## 2016-12-10 NOTE — Telephone Encounter (Signed)
Faxed

## 2017-03-25 ENCOUNTER — Other Ambulatory Visit: Payer: Self-pay | Admitting: Internal Medicine

## 2017-03-26 NOTE — Telephone Encounter (Signed)
faxed

## 2017-05-30 ENCOUNTER — Other Ambulatory Visit: Payer: Self-pay | Admitting: Internal Medicine

## 2017-06-04 NOTE — Telephone Encounter (Signed)
Done erx 

## 2017-07-06 ENCOUNTER — Other Ambulatory Visit: Payer: Self-pay | Admitting: Internal Medicine

## 2017-07-07 NOTE — Telephone Encounter (Signed)
Done erx 

## 2017-08-18 ENCOUNTER — Other Ambulatory Visit: Payer: Self-pay | Admitting: Internal Medicine

## 2017-09-06 ENCOUNTER — Other Ambulatory Visit: Payer: Self-pay | Admitting: Internal Medicine

## 2017-09-08 ENCOUNTER — Encounter: Payer: Self-pay | Admitting: Family

## 2017-09-08 ENCOUNTER — Ambulatory Visit: Payer: BLUE CROSS/BLUE SHIELD | Admitting: Family

## 2017-09-08 VITALS — BP 120/78 | HR 93 | Temp 97.9°F | Ht 64.0 in | Wt 153.0 lb

## 2017-09-08 DIAGNOSIS — R000 Tachycardia, unspecified: Secondary | ICD-10-CM | POA: Diagnosis not present

## 2017-09-08 DIAGNOSIS — E782 Mixed hyperlipidemia: Secondary | ICD-10-CM

## 2017-09-08 DIAGNOSIS — F411 Generalized anxiety disorder: Secondary | ICD-10-CM

## 2017-09-08 DIAGNOSIS — E039 Hypothyroidism, unspecified: Secondary | ICD-10-CM | POA: Diagnosis not present

## 2017-09-08 MED ORDER — ATORVASTATIN CALCIUM 10 MG PO TABS: 10.0000 mg | ORAL_TABLET | Freq: Every day | ORAL | 1 refills | Status: DC

## 2017-09-08 MED ORDER — CLONAZEPAM 1 MG PO TABS: 1.0000 mg | ORAL_TABLET | Freq: Two times a day (BID) | ORAL | 0 refills | Status: DC | PRN

## 2017-09-08 MED ORDER — ATENOLOL 25 MG PO TABS: 25.0000 mg | ORAL_TABLET | Freq: Every day | ORAL | 1 refills | Status: DC

## 2017-09-08 NOTE — Progress Notes (Signed)
Christopher Arias is a 57 y.o. male with the following history as recorded in [employer]:  Patient Active Problem List   Diagnosis Date Noted  . Insomnia 08/28/2016  . Cough 06/30/2015  . Inappropriate sinus tachycardia 02/03/2015  . Smoker 03/12/2012  . Hidradenitis 12/19/2010  . Preventative health care 11/13/2010  . Special screening for malignant neoplasm of prostate 08/23/2010  . Hypothyroidism 03/16/2010  . ANTERIOR PITUITARY HYPERFUNCTION 11/23/2009  . GYNECOMASTIA 11/10/2009  . Hyperlipidemia 11/24/2007  . Anxiety state 11/24/2007  . GERD 11/24/2007    Current Outpatient Medications  Medication Sig Dispense Refill  . aspirin 81 MG EC tablet Take 1 tablet (81 mg total) by mouth daily. Swallow whole. 30 tablet 12  . levothyroxine (SYNTHROID, LEVOTHROID) 150 MCG tablet 1 TAB BY MOUTH DAILY 90 tablet 2  . sertraline (ZOLOFT) 100 MG tablet TAKE 2 TABLETS (200 MG TOTAL) BY MOUTH DAILY. 180 tablet 3  . atenolol (TENORMIN) 25 MG tablet Take 1 tablet (25 mg total) by mouth daily. 90 tablet 1  . atorvastatin (LIPITOR) 10 MG tablet Take 1 tablet (10 mg total) by mouth daily. 90 tablet 1  . clonazePAM (KLONOPIN) 1 MG tablet Take 1 tablet (1 mg total) by mouth 2 (two) times daily as needed for anxiety. 60 tablet 0   No current facility-administered medications for this visit.     Allergies: Patient has no known allergies.  Past Medical History:  Diagnosis Date  . ANTERIOR PITUITARY HYPERFUNCTION 11/23/2009  . ANXIETY 11/24/2007  . GERD 11/24/2007  . GYNECOMASTIA 11/10/2009  . HYPERLIPIDEMIA 11/24/2007  . HYPOTHYROIDISM, POST-RADIATION 03/16/2010    Past Surgical History:  Procedure Laterality Date  . INGUINAL HERNIA REPAIR      Family History  Problem Relation Age of Onset  . Stroke Other   . COPD Other   . Heart disease Other   . Colon cancer Neg Hx   . Heart disease Father     Social History   Tobacco Use  . Smoking status: Current Every Day Smoker    Packs/day: 0.50   Types: Cigarettes  . Smokeless tobacco: Never Used  Substance Use Topics  . Alcohol use: Yes    Comment: rarely    Subjective:  Patient presents with concerns about his thyroid medication- ran out of Synthroid and had to take old prescription of 88 mcg; overdue for yearly OV; needs to go back on 150 mcg; has been doing this for the past 2 weeks and notes that he is just very tired; Also needs to get refills on his chronic medications updated until he can get his CPE scheduled with his PCP; admits that he did not ever start the Lipitor that was prescribed after his CPE last year; agreeable to starting;  Requesting work note for the next 2 days;   Objective:  Vitals:   09/08/17 1528  BP: 120/78  Pulse: 93  Temp: 97.9 F (36.6 C)  TempSrc: Oral  SpO2: 96%  Weight: 153 lb 0.6 oz (69.4 kg)  Height: 5\' 4"  (1.626 m)    General: Well developed, well nourished, in no acute distress  Skin : Warm and dry.  Head: Normocephalic and atraumatic  Lungs: Respirations unlabored; clear to auscultation bilaterally without wheeze, rales, rhonchi  CVS exam: normal rate and regular rhythm.  Neurologic: Alert and oriented; speech intact; face symmetrical; moves all extremities well; CNII-XII intact without focal deficit  Assessment:  1. Hypothyroidism, unspecified type   2. Tachycardia   3. Inappropriate sinus tachycardia  4. Mixed hyperlipidemia   5. Anxiety state     Plan:  1. Agree that fatigue is most likely due to taking wrong dose of Synthroid; re-start 150 mcg; follow-up with his PCP for yearly follow-up in about 1 month to re-check labs; 2. & 3. Refill given on Atenolol; 4. Rx for Lipitor given- he did not start as prescribed last year; follow-up in 1 year to get labs re-checked; 5. Refill given on Klonopin x 1 month.   No follow-ups on file.  No orders of the defined types were placed in this encounter.   Requested Prescriptions   Signed Prescriptions Disp Refills  . atenolol  (TENORMIN) 25 MG tablet 90 tablet 1    Sig: Take 1 tablet (25 mg total) by mouth daily.  Marland Kitchen atorvastatin (LIPITOR) 10 MG tablet 90 tablet 1    Sig: Take 1 tablet (10 mg total) by mouth daily.  . clonazePAM (KLONOPIN) 1 MG tablet 60 tablet 0    Sig: Take 1 tablet (1 mg total) by mouth 2 (two) times daily as needed for anxiety.

## 2017-10-09 ENCOUNTER — Ambulatory Visit (INDEPENDENT_AMBULATORY_CARE_PROVIDER_SITE_OTHER): Payer: BLUE CROSS/BLUE SHIELD | Admitting: Internal Medicine

## 2017-10-09 ENCOUNTER — Other Ambulatory Visit (INDEPENDENT_AMBULATORY_CARE_PROVIDER_SITE_OTHER): Payer: BLUE CROSS/BLUE SHIELD

## 2017-10-09 ENCOUNTER — Encounter: Payer: Self-pay | Admitting: Internal Medicine

## 2017-10-09 VITALS — BP 110/54 | HR 65 | Temp 97.9°F | Ht 64.0 in | Wt 151.8 lb

## 2017-10-09 DIAGNOSIS — F411 Generalized anxiety disorder: Secondary | ICD-10-CM | POA: Diagnosis not present

## 2017-10-09 DIAGNOSIS — Z0000 Encounter for general adult medical examination without abnormal findings: Secondary | ICD-10-CM

## 2017-10-09 DIAGNOSIS — R000 Tachycardia, unspecified: Secondary | ICD-10-CM | POA: Diagnosis not present

## 2017-10-09 LAB — URINALYSIS, ROUTINE W REFLEX MICROSCOPIC
BILIRUBIN URINE: NEGATIVE
Hgb urine dipstick: NEGATIVE
Leukocytes, UA: NEGATIVE
Nitrite: NEGATIVE
PH: 6 (ref 5.0–8.0)
RBC / HPF: NONE SEEN (ref 0–?)
Total Protein, Urine: NEGATIVE
UROBILINOGEN UA: 0.2 (ref 0.0–1.0)
Urine Glucose: NEGATIVE
WBC, UA: NONE SEEN (ref 0–?)

## 2017-10-09 LAB — CBC WITH DIFFERENTIAL/PLATELET
BASOS PCT: 0.8 % (ref 0.0–3.0)
Basophils Absolute: 0.1 10*3/uL (ref 0.0–0.1)
EOS ABS: 0.1 10*3/uL (ref 0.0–0.7)
EOS PCT: 1.3 % (ref 0.0–5.0)
HEMATOCRIT: 45.9 % (ref 39.0–52.0)
Hemoglobin: 15.5 g/dL (ref 13.0–17.0)
LYMPHS PCT: 15.5 % (ref 12.0–46.0)
Lymphs Abs: 1.2 10*3/uL (ref 0.7–4.0)
MCHC: 33.9 g/dL (ref 30.0–36.0)
MCV: 86.9 fl (ref 78.0–100.0)
MONO ABS: 0.5 10*3/uL (ref 0.1–1.0)
Monocytes Relative: 6.9 % (ref 3.0–12.0)
NEUTROS ABS: 5.9 10*3/uL (ref 1.4–7.7)
Neutrophils Relative %: 75.5 % (ref 43.0–77.0)
PLATELETS: 187 10*3/uL (ref 150.0–400.0)
RBC: 5.28 Mil/uL (ref 4.22–5.81)
RDW: 13.6 % (ref 11.5–15.5)
WBC: 7.8 10*3/uL (ref 4.0–10.5)

## 2017-10-09 MED ORDER — ATORVASTATIN CALCIUM 10 MG PO TABS: 10.0000 mg | ORAL_TABLET | Freq: Every day | ORAL | 3 refills | Status: DC

## 2017-10-09 MED ORDER — LEVOTHYROXINE SODIUM 150 MCG PO TABS: 150.0000 ug | ORAL_TABLET | Freq: Every day | ORAL | 3 refills | Status: DC

## 2017-10-09 MED ORDER — SERTRALINE HCL 100 MG PO TABS: 200.0000 mg | ORAL_TABLET | Freq: Every day | ORAL | 3 refills | Status: DC

## 2017-10-09 MED ORDER — ATENOLOL 25 MG PO TABS: 25.0000 mg | ORAL_TABLET | Freq: Every day | ORAL | 3 refills | Status: DC

## 2017-10-09 MED ORDER — CLONAZEPAM 1 MG PO TABS: 1.0000 mg | ORAL_TABLET | Freq: Two times a day (BID) | ORAL | 2 refills | Status: DC | PRN

## 2017-10-09 NOTE — Assessment & Plan Note (Signed)
stable overall by history and exam, recent data reviewed with pt, and pt to continue medical treatment as before,  to f/u any worsening symptoms or concerns  

## 2017-10-09 NOTE — Patient Instructions (Signed)

## 2017-10-09 NOTE — Progress Notes (Signed)
Subjective:    Patient ID: Christopher Arias, male    DOB: 1961/05/13, 57 y.o.   MRN: [id number]  HPI  Here for wellness and f/u;  Overall doing ok;  Pt denies Chest pain, worsening SOB, DOE, wheezing, orthopnea, PND, worsening LE edema, palpitations, dizziness or syncope.  Pt denies neurological change such as new headache, facial or extremity weakness.  Pt denies polydipsia, polyuria, or low sugar symptoms. Pt states overall good compliance with treatment and medications, good tolerability, and has been trying to follow appropriate diet.  Pt denies worsening depressive symptoms, suicidal ideation or panic. No fever, night sweats, wt loss, loss of appetite, or other constitutional symptoms.  Pt states good ability with ADL's, has low fall risk, home safety reviewed and adequate, no other significant changes in hearing or vision, and only occasionally active with exercise. Wife health getting worse, alss more pressure at work with ongoing stress. No other interval hx or new complaints Past Medical History:  Diagnosis Date  . ANTERIOR PITUITARY HYPERFUNCTION 11/23/2009  . ANXIETY 11/24/2007  . GERD 11/24/2007  . GYNECOMASTIA 11/10/2009  . HYPERLIPIDEMIA 11/24/2007  . HYPOTHYROIDISM, POST-RADIATION 03/16/2010   Past Surgical History:  Procedure Laterality Date  . INGUINAL HERNIA REPAIR      reports that he has been smoking cigarettes.  He has been smoking about 0.50 packs per day. He has never used smokeless tobacco. He reports that he drinks alcohol. He reports that he does not use drugs. family history includes COPD in his other; Heart disease in his father and other; Stroke in his other. No Known Allergies Current Outpatient Medications on File Prior to Visit  Medication Sig Dispense Refill  . aspirin 81 MG EC tablet Take 1 tablet (81 mg total) by mouth daily. Swallow whole. 30 tablet 12   No current facility-administered medications on file prior to visit.    Review of Systems Constitutional:  Negative for other unusual diaphoresis, sweats, appetite or weight changes HENT: Negative for other worsening hearing loss, ear pain, facial swelling, mouth sores or neck stiffness.   Eyes: Negative for other worsening pain, redness or other visual disturbance.  Respiratory: Negative for other stridor or swelling Cardiovascular: Negative for other palpitations or other chest pain  Gastrointestinal: Negative for worsening diarrhea or loose stools, blood in stool, distention or other pain Genitourinary: Negative for hematuria, flank pain or other change in urine volume.  Musculoskeletal: Negative for myalgias or other joint swelling.  Skin: Negative for other color change, or other wound or worsening drainage.  Neurological: Negative for other syncope or numbness. Hematological: Negative for other adenopathy or swelling Psychiatric/Behavioral: Negative for hallucinations, other worsening agitation, SI, self-injury, or new decreased concentration All other system neg per pt    Objective:   Physical Exam BP (!) 110/54 (BP Location: Left Arm, Patient Position: Sitting, Cuff Size: Normal)   Pulse 65   Temp 97.9 F (36.6 C) (Oral)   Ht 5\' 4"  (1.626 m)   Wt 151 lb 12 oz (68.8 kg)   SpO2 94%   BMI 26.05 kg/m  VS noted,  Constitutional: Pt is oriented to person, place, and time. Appears well-developed and well-nourished, in no significant distress and comfortable Head: Normocephalic and atraumatic  Eyes: Conjunctivae and EOM are normal. Pupils are equal, round, and reactive to light Right Ear: External ear normal without discharge Left Ear: External ear normal without discharge Nose: Nose without discharge or deformity Mouth/Throat: Oropharynx is without other ulcerations and moist  Neck: Normal range  of motion. Neck supple. No JVD present. No tracheal deviation present or significant neck LA or mass Cardiovascular: Normal rate, regular rhythm, normal heart sounds and intact distal pulses.     Pulmonary/Chest: WOB normal and breath sounds without rales or wheezing  Abdominal: Soft. Bowel sounds are normal. NT. No HSM  Musculoskeletal: Normal range of motion. Exhibits no edema Lymphadenopathy: Has no other cervical adenopathy.  Neurological: Pt is alert and oriented to person, place, and time. Pt has normal reflexes. No cranial nerve deficit. Motor grossly intact, Gait intact Skin: Skin is warm and dry. No rash noted or new ulcerations Psychiatric:  Has nervous mood and affect. Behavior is normal without agitation No other exam findings  Lab Results  Component Value Date   WBC 8.8 08/28/2016   HGB 15.4 08/28/2016   HCT 46.1 08/28/2016   PLT 198.0 08/28/2016   GLUCOSE 85 08/28/2016   CHOL 191 08/28/2016   TRIG 196.0 (H) 08/28/2016   HDL 33.40 (L) 08/28/2016   LDLCALC 118 (H) 08/28/2016   ALT 17 08/28/2016   AST 13 08/28/2016   NA 141 08/28/2016   K 4.5 08/28/2016   CL 107 08/28/2016   CREATININE 0.94 08/28/2016   BUN 16 08/28/2016   CO2 27 08/28/2016   TSH 0.04 (L) 08/28/2016   PSA 3.18 08/28/2016      Assessment & Plan:

## 2017-10-09 NOTE — Assessment & Plan Note (Signed)

## 2017-10-10 ENCOUNTER — Other Ambulatory Visit: Payer: Self-pay | Admitting: Internal Medicine

## 2017-10-10 ENCOUNTER — Encounter: Payer: Self-pay | Admitting: Internal Medicine

## 2017-10-10 DIAGNOSIS — E785 Hyperlipidemia, unspecified: Secondary | ICD-10-CM

## 2017-10-10 DIAGNOSIS — E039 Hypothyroidism, unspecified: Secondary | ICD-10-CM

## 2017-10-10 LAB — HEPATIC FUNCTION PANEL
ALT: 16 U/L (ref 0–53)
AST: 15 U/L (ref 0–37)
Albumin: 4.6 g/dL (ref 3.5–5.2)
Alkaline Phosphatase: 53 U/L (ref 39–117)
BILIRUBIN DIRECT: 0.1 mg/dL (ref 0.0–0.3)
TOTAL PROTEIN: 6.8 g/dL (ref 6.0–8.3)
Total Bilirubin: 0.5 mg/dL (ref 0.2–1.2)

## 2017-10-10 LAB — BASIC METABOLIC PANEL
BUN: 20 mg/dL (ref 6–23)
CHLORIDE: 106 meq/L (ref 96–112)
CO2: 24 mEq/L (ref 19–32)
Calcium: 9.5 mg/dL (ref 8.4–10.5)
Creatinine, Ser: 0.85 mg/dL (ref 0.40–1.50)
GFR: 98.93 mL/min (ref >60.00)
Glucose, Bld: 94 mg/dL (ref 70–99)
Potassium: 4.4 mEq/L (ref 3.5–5.1)
SODIUM: 140 meq/L (ref 135–145)

## 2017-10-10 LAB — LIPID PANEL
CHOLESTEROL: 187 mg/dL (ref 0–200)
HDL: 39.4 mg/dL (ref >39.00)
LDL Cholesterol: 132 mg/dL — ABNORMAL HIGH (ref 0–99)
NONHDL: 147.96
TRIGLYCERIDES: 78 mg/dL (ref 0.0–149.0)
Total CHOL/HDL Ratio: 5
VLDL: 15.6 mg/dL (ref 0.0–40.0)

## 2017-10-10 LAB — TSH: TSH: 0.02 u[IU]/mL — ABNORMAL LOW (ref 0.35–4.50)

## 2017-10-10 MED ORDER — ATORVASTATIN CALCIUM 20 MG PO TABS: 20.0000 mg | ORAL_TABLET | Freq: Every day | ORAL | 3 refills | Status: DC

## 2017-10-10 MED ORDER — LEVOTHYROXINE SODIUM 100 MCG PO TABS: 100.0000 ug | ORAL_TABLET | Freq: Every day | ORAL | 3 refills | Status: DC

## 2018-01-13 ENCOUNTER — Ambulatory Visit: Payer: BLUE CROSS/BLUE SHIELD | Admitting: Internal Medicine

## 2018-01-14 ENCOUNTER — Encounter: Payer: Self-pay | Admitting: Internal Medicine

## 2018-01-14 ENCOUNTER — Ambulatory Visit: Payer: BLUE CROSS/BLUE SHIELD | Admitting: Internal Medicine

## 2018-01-14 VITALS — BP 120/86 | HR 92 | Temp 98.3°F | Ht 64.0 in | Wt 148.0 lb

## 2018-01-14 DIAGNOSIS — F411 Generalized anxiety disorder: Secondary | ICD-10-CM

## 2018-01-14 DIAGNOSIS — E039 Hypothyroidism, unspecified: Secondary | ICD-10-CM

## 2018-01-14 DIAGNOSIS — K645 Perianal venous thrombosis: Secondary | ICD-10-CM | POA: Diagnosis not present

## 2018-01-14 MED ORDER — CLONAZEPAM 1 MG PO TABS: 1.0000 mg | ORAL_TABLET | Freq: Two times a day (BID) | ORAL | 2 refills | Status: DC | PRN

## 2018-01-14 MED ORDER — LIDOCAINE-HYDROCORTISONE ACE 3-0.5 % RE CREA
1.0000 | TOPICAL_CREAM | Freq: Two times a day (BID) | RECTAL | 1 refills | Status: DC
Start: 2018-01-14 — End: 2018-03-10

## 2018-01-14 NOTE — Assessment & Plan Note (Signed)
stable overall by history and exam, recent data reviewed with pt, and pt to continue medical treatment as before,  to f/u any worsening symptoms or concerns  

## 2018-01-14 NOTE — Assessment & Plan Note (Signed)
Mild to mod, for anamantle HC asd, refer general surgury,,  to f/u any worsening symptoms or concerns

## 2018-01-14 NOTE — Patient Instructions (Signed)
Please take all new medication as prescribed - the cream  Please continue all other medications as before, and refills have been done if requested - the klonopin  Please have the pharmacy call with any other refills you may need.  Please continue your efforts at being more active, low cholesterol diet, and weight control.  Please keetherwise up to date with prevention measures today.  Please keep your appointments with your specialists as you may have planned  You will be contacted regarding the referral for: General Surgury

## 2018-01-14 NOTE — Progress Notes (Signed)
Subjective:    Patient ID: Christopher Arias, male    DOB: May 11, 1961, 57 y.o.   MRN: [id number]  HPI  Here with 1 month onset persistent maybe even gradually worsening anal pain with a painful lump worse with walking or sitting, but without bleeding or other drainage.    Denies worsening reflux, abd pain, dysphagia, n/v, bowel change or blood.  Pt denies chest pain, increased sob or doe, wheezing, orthopnea, PND, increased LE swelling, palpitations, dizziness or syncope.  Denies worsening depressive symptoms, suicidal ideation, or panic; has ongoing anxiety, stable recently, asks for med refill  Denies hyper or hypo thyroid symptoms such as voice, skin or hair change. Past Medical History:  Diagnosis Date  . ANTERIOR PITUITARY HYPERFUNCTION 11/23/2009  . ANXIETY 11/24/2007  . GERD 11/24/2007  . GYNECOMASTIA 11/10/2009  . HYPERLIPIDEMIA 11/24/2007  . HYPOTHYROIDISM, POST-RADIATION 03/16/2010   Past Surgical History:  Procedure Laterality Date  . INGUINAL HERNIA REPAIR      reports that he has been smoking cigarettes. He has been smoking about 0.50 packs per day. He has never used smokeless tobacco. He reports that he drinks alcohol. He reports that he does not use drugs. family history includes COPD in his other; Heart disease in his father and other; Stroke in his other. No Known Allergies Current Outpatient Medications on File Prior to Visit  Medication Sig Dispense Refill  . aspirin 81 MG EC tablet Take 1 tablet (81 mg total) by mouth daily. Swallow whole. 30 tablet 12  . atenolol (TENORMIN) 25 MG tablet Take 1 tablet (25 mg total) by mouth daily. 90 tablet 3  . atorvastatin (LIPITOR) 20 MG tablet Take 1 tablet (20 mg total) by mouth daily. 90 tablet 3  . levothyroxine (SYNTHROID, LEVOTHROID) 100 MCG tablet Take 1 tablet (100 mcg total) by mouth daily. 90 tablet 3  . sertraline (ZOLOFT) 100 MG tablet Take 2 tablets (200 mg total) by mouth daily. 180 tablet 3   No current  facility-administered medications on file prior to visit.    Review of Systems  Constitutional: Negative for other unusual diaphoresis or sweats HENT: Negative for ear discharge or swelling Eyes: Negative for other worsening visual disturbances Respiratory: Negative for stridor or other swelling  Gastrointestinal: Negative for worsening distension or other blood Genitourinary: Negative for retention or other urinary change Musculoskeletal: Negative for other MSK pain or swelling Skin: Negative for color change or other new lesions Neurological: Negative for worsening tremors and other numbness  Psychiatric/Behavioral: Negative for worsening agitation or other fatigue All other system neg per pt    Objective:   Physical Exam BP 120/86   Pulse 92   Temp 98.3 F (36.8 C) (Oral)   Ht 5\' 4"  (1.626 m)   Wt 148 lb (67.1 kg)   SpO2 97%   BMI 25.40 kg/m  VS noted,  Constitutional: Pt appears in NAD HENT: Head: NCAT.  Right Ear: External ear normal.  Left Ear: External ear normal.  Eyes: . Pupils are equal, round, and reactive to light. Conjunctivae and EOM are normal Nose: without d/c or deformity Neck: Neck supple. Gross normal ROM Cardiovascular: Normal rate and regular rhythm.   Pulmonary/Chest: Effort normal and breath sounds without rales or wheezing.  Abd:  Soft, NT, ND, + BS, no organomegaly Rectal:  Large tender ext hemorrhoid without bleeding Neurological: Pt is alert. At baseline orientation, motor grossly intact Skin: Skin is warm. No rashes, other new lesions, no LE edema Psychiatric: Pt behavior is  normal without agitation , mild nervous No other exam findings  Lab Results  Component Value Date   WBC 7.8 10/09/2017   HGB 15.5 10/09/2017   HCT 45.9 10/09/2017   PLT 187.0 10/09/2017   GLUCOSE 94 10/09/2017   CHOL 187 10/09/2017   TRIG 78.0 10/09/2017   HDL 39.40 10/09/2017   LDLCALC 132 (H) 10/09/2017   ALT 16 10/09/2017   AST 15 10/09/2017   NA 140  10/09/2017   K 4.4 10/09/2017   CL 106 10/09/2017   CREATININE 0.85 10/09/2017   BUN 20 10/09/2017   CO2 24 10/09/2017   TSH 0.02 (L) 10/09/2017   PSA 3.18 08/28/2016        Assessment & Plan:

## 2018-01-15 DIAGNOSIS — K648 Other hemorrhoids: Secondary | ICD-10-CM | POA: Diagnosis not present

## 2018-01-26 ENCOUNTER — Ambulatory Visit: Payer: Self-pay | Admitting: General Surgery

## 2018-01-26 DIAGNOSIS — K643 Fourth degree hemorrhoids: Secondary | ICD-10-CM | POA: Diagnosis not present

## 2018-01-26 NOTE — H&P (Signed)
History of Present Illness Christopher Ruff MD; 11/14/4313 3:59 PM) The patient is a 57 year old male who presents with anal pain. Pt presented to urgent office per request of Dr. Jenny Reichmann at [facility]. He diagnosed him with a thrombosed external hemorrhoid. He stated that he had been dealing with this pain for about a month. He states he had a thrombosed hemorrhoid lanced years ago by Dr. Harlow Asa, and denies ever having an anal abscess or fissure. He denies constipation or diarrhea leading up to this. Denies bleeding. Denies pain with bowel movements. 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  He was using over-the-counter Preparation H and started taking a steroid cream yesterday which he states helped tremendously but the pain is still present. He has not tried any sitz baths. He is otherwise healthy and does not take blood thinners. He smokes about 4 cigarettes a day.   Problem List/Past Medical Christopher Ruff, MD; 4/00/8676 4:04 PM) PROLAPSED INTERNAL HEMORRHOIDS, GRADE 4 (K64.3)  Past Surgical History Christopher Ruff, MD; 1/95/0932 4:04 PM) Open Inguinal Hernia Surgery Bilateral.  Diagnostic Studies History Christopher Ruff, MD; 6/71/2458 4:04 PM) Colonoscopy 1-5 years ago  Allergies Christopher Lorenzo, LPN; 0/99/8338 2:50 PM) No Known Drug Allergies [01/15/2018]:  Medication History Christopher Lorenzo, LPN; 5/39/7673 4:19 PM) Aspirin (81MG  Tablet, Oral) Active. Atenolol (25MG  Tablet, Oral) Active. Atorvastatin Calcium (20MG  Tablet, Oral) Active. ClonazePAM (1MG  Tablet, Oral) Active. Levothyroxine Sodium (100MCG Tablet, Oral) Active. Sertraline HCl (100MG  Tablet, Oral) Active. Lidocaine HCl (0.5% Gel, External) Active. Medications Reconciled  Social History Christopher Ruff, MD; 3/79/0240 4:04 PM) Alcohol use Occasional alcohol use. Tobacco use Current every day smoker.  Family  History Christopher Ruff, MD; 9/73/5329 4:04 PM) Arthritis Family Members In General. Cerebrovascular Accident Father, Mother. Heart Disease Father. Hypertension Father. Respiratory Condition Father.  Other Problems Christopher Ruff, MD; 02/24/2682 4:04 PM) Hemorrhoids Thyroid Disease     Review of Systems Christopher Ruff MD; 09/19/6220 4:04 PM) General Present- Fatigue. Not Present- Appetite Loss, Chills, Fever, Night Sweats, Weight Gain and Weight Loss. HEENT Not Present- Earache, Hearing Loss, Hoarseness, Nose Bleed, Oral Ulcers, Ringing in the Ears, Seasonal Allergies, Sinus Pain, Sore Throat, Visual Disturbances, Wears glasses/contact lenses and Yellow Eyes. Respiratory Not Present- Bloody sputum, Chronic Cough, Difficulty Breathing, Snoring and Wheezing. Cardiovascular Not Present- Chest Pain, Difficulty Breathing Lying Down, Leg Cramps, Palpitations, Rapid Heart Rate, Shortness of Breath and Swelling of Extremities. Gastrointestinal Present- Hemorrhoids. Not Present- Abdominal Pain, Bloating, Bloody Stool, Change in Bowel Habits, Chronic diarrhea, Constipation, Difficulty Swallowing, Excessive gas, Gets full quickly at meals, Indigestion, Nausea, Rectal Pain and Vomiting.  Vitals Claiborne Billings Dockery LPN; 9/79/8921 1:94 PM) 01/26/2018 3:44 PM Weight: 150.8 lb Height: 64in Body Surface Area: 1.73 m Body Mass Index: 25.88 kg/m  Temp.: 97.76F(Temporal)  Pulse: 68 (Regular)  BP: 126/74 (Sitting, Left Arm, Standard)      Physical Exam Christopher Ruff MD; 1/74/0814 4:04 PM)  The physical exam findings are as follows: Note:GENERAL: Well-developed, well nourished male in no acute distress  EYES: No scleral icterus Pupils equal, lids normal  EXTERNAL EARS: Intact, no masses or lesions EXTERNAL NOSE: Intact, no masses or lesions MOUTH: Lips - no lesions Dentition - normal for age  RESPIRATORY: Normal effort, no use of accessory  muscles  MUSCULOSKELETAL: Normal gait Grossly normal ROM upper extremities Grossly normal ROM lower extremities  SKIN: Warm and dry Not diaphoretic  PSYCHIATRIC: Normal judgement and insight Normal mood and affect Alert, oriented x 3  Rectal Note: There is a large, left sided hemorrhoidal prolapse from the anus and a smaller R anterior reducible prolopse Mild TTP    Assessment & Plan Christopher Ruff MD; 2/72/5366 4:02 PM)  PROLAPSED INTERNAL HEMORRHOIDS, GRADE 4 (K64.3) Impression: 57yo M with large L lateral prolapsed hemorrhoid and smaller R anterior hemorrhoid. I have recommended hemorrhoidectomy. We discussed that this will be painful and will require time off of work and time to heal. Risks include bleeding, recurrence and pain.

## 2018-03-02 ENCOUNTER — Telehealth: Payer: Self-pay | Admitting: Internal Medicine

## 2018-03-02 NOTE — Telephone Encounter (Signed)
Pt called wanting to know if we can band external hemorrhoids. Discussed with him that external cannot be banded, only the internal ones. Pt verbalized understanding.

## 2018-03-10 ENCOUNTER — Other Ambulatory Visit: Payer: Self-pay | Admitting: Internal Medicine

## 2018-03-24 DIAGNOSIS — K649 Unspecified hemorrhoids: Secondary | ICD-10-CM | POA: Diagnosis not present

## 2018-04-03 DIAGNOSIS — K643 Fourth degree hemorrhoids: Secondary | ICD-10-CM | POA: Diagnosis not present

## 2018-04-08 DIAGNOSIS — K643 Fourth degree hemorrhoids: Secondary | ICD-10-CM | POA: Insufficient documentation

## 2018-04-09 ENCOUNTER — Other Ambulatory Visit: Payer: Self-pay | Admitting: Internal Medicine

## 2018-04-24 DIAGNOSIS — K643 Fourth degree hemorrhoids: Secondary | ICD-10-CM | POA: Diagnosis not present

## 2018-05-08 DIAGNOSIS — F17200 Nicotine dependence, unspecified, uncomplicated: Secondary | ICD-10-CM | POA: Diagnosis not present

## 2018-05-08 DIAGNOSIS — Z7982 Long term (current) use of aspirin: Secondary | ICD-10-CM | POA: Diagnosis not present

## 2018-05-08 DIAGNOSIS — Z79899 Other long term (current) drug therapy: Secondary | ICD-10-CM | POA: Diagnosis not present

## 2018-05-08 DIAGNOSIS — K643 Fourth degree hemorrhoids: Secondary | ICD-10-CM | POA: Diagnosis not present

## 2018-05-08 DIAGNOSIS — K648 Other hemorrhoids: Secondary | ICD-10-CM | POA: Diagnosis not present

## 2018-05-08 DIAGNOSIS — E079 Disorder of thyroid, unspecified: Secondary | ICD-10-CM | POA: Diagnosis not present

## 2018-05-21 ENCOUNTER — Other Ambulatory Visit: Payer: Self-pay | Admitting: Internal Medicine

## 2018-05-22 NOTE — Telephone Encounter (Signed)
Done erx 

## 2018-09-13 ENCOUNTER — Other Ambulatory Visit: Payer: Self-pay | Admitting: Internal Medicine

## 2018-09-14 NOTE — Telephone Encounter (Signed)
Done erx 

## 2018-11-20 ENCOUNTER — Other Ambulatory Visit: Payer: Self-pay | Admitting: Internal Medicine

## 2018-11-22 ENCOUNTER — Other Ambulatory Visit: Payer: Self-pay | Admitting: Internal Medicine

## 2018-12-31 ENCOUNTER — Other Ambulatory Visit: Payer: Self-pay | Admitting: Internal Medicine

## 2019-01-01 NOTE — Telephone Encounter (Signed)
Done erx  Please remind pt he is due for yearly f/u aug 2020

## 2019-01-07 ENCOUNTER — Other Ambulatory Visit: Payer: Self-pay | Admitting: Internal Medicine

## 2019-01-29 ENCOUNTER — Other Ambulatory Visit: Payer: Self-pay | Admitting: Internal Medicine

## 2019-02-01 ENCOUNTER — Other Ambulatory Visit: Payer: Self-pay | Admitting: Internal Medicine

## 2019-02-02 NOTE — Telephone Encounter (Signed)
Done erx  Please to contact pt  - to make ROV for further refills please

## 2019-02-26 ENCOUNTER — Other Ambulatory Visit: Payer: Self-pay | Admitting: Internal Medicine

## 2019-02-26 NOTE — Telephone Encounter (Signed)
Called pt to schedule OV. LOV with PCP was 01/14/18. Left message for patient to call back to schedule OV and informed him a 15 day supply of his levothyroxine has been sent.

## 2019-02-26 NOTE — Telephone Encounter (Signed)
Called pt to schedule OV. LOV with PCP was 01/14/18. Left message for patient to call back to schedule OV.

## 2019-03-08 ENCOUNTER — Other Ambulatory Visit (INDEPENDENT_AMBULATORY_CARE_PROVIDER_SITE_OTHER): Payer: BC Managed Care – PPO

## 2019-03-08 ENCOUNTER — Other Ambulatory Visit: Payer: Self-pay

## 2019-03-08 ENCOUNTER — Other Ambulatory Visit: Payer: Self-pay | Admitting: Internal Medicine

## 2019-03-08 ENCOUNTER — Encounter: Payer: Self-pay | Admitting: Internal Medicine

## 2019-03-08 ENCOUNTER — Ambulatory Visit (INDEPENDENT_AMBULATORY_CARE_PROVIDER_SITE_OTHER): Payer: BC Managed Care – PPO | Admitting: Internal Medicine

## 2019-03-08 VITALS — BP 122/80 | HR 100 | Temp 98.5°F | Ht 64.0 in | Wt 154.0 lb

## 2019-03-08 DIAGNOSIS — J309 Allergic rhinitis, unspecified: Secondary | ICD-10-CM | POA: Insufficient documentation

## 2019-03-08 DIAGNOSIS — R000 Tachycardia, unspecified: Secondary | ICD-10-CM | POA: Diagnosis not present

## 2019-03-08 DIAGNOSIS — Z0000 Encounter for general adult medical examination without abnormal findings: Secondary | ICD-10-CM

## 2019-03-08 DIAGNOSIS — E611 Iron deficiency: Secondary | ICD-10-CM | POA: Diagnosis not present

## 2019-03-08 DIAGNOSIS — E538 Deficiency of other specified B group vitamins: Secondary | ICD-10-CM | POA: Diagnosis not present

## 2019-03-08 DIAGNOSIS — Z23 Encounter for immunization: Secondary | ICD-10-CM | POA: Diagnosis not present

## 2019-03-08 DIAGNOSIS — R739 Hyperglycemia, unspecified: Secondary | ICD-10-CM

## 2019-03-08 DIAGNOSIS — E559 Vitamin D deficiency, unspecified: Secondary | ICD-10-CM

## 2019-03-08 DIAGNOSIS — E782 Mixed hyperlipidemia: Secondary | ICD-10-CM

## 2019-03-08 LAB — BASIC METABOLIC PANEL
BUN: 19 mg/dL (ref 6–23)
CO2: 24 mEq/L (ref 19–32)
Calcium: 9.6 mg/dL (ref 8.4–10.5)
Chloride: 108 mEq/L (ref 96–112)
Creatinine, Ser: 0.92 mg/dL (ref 0.40–1.50)
GFR: 84.53 mL/min (ref >60.00)
Glucose, Bld: 96 mg/dL (ref 70–99)
Potassium: 4.4 mEq/L (ref 3.5–5.1)
Sodium: 138 mEq/L (ref 135–145)

## 2019-03-08 LAB — HEPATIC FUNCTION PANEL
ALT: 17 U/L (ref 0–53)
AST: 14 U/L (ref 0–37)
Albumin: 4.6 g/dL (ref 3.5–5.2)
Alkaline Phosphatase: 65 U/L (ref 39–117)
Bilirubin, Direct: 0.1 mg/dL (ref 0.0–0.3)
Total Bilirubin: 0.4 mg/dL (ref 0.2–1.2)
Total Protein: 6.5 g/dL (ref 6.0–8.3)

## 2019-03-08 LAB — IBC PANEL
Iron: 63 ug/dL (ref 42–165)
Saturation Ratios: 13.9 % — ABNORMAL LOW (ref 20.0–50.0)
Transferrin: 324 mg/dL (ref 212.0–360.0)

## 2019-03-08 LAB — URINALYSIS, ROUTINE W REFLEX MICROSCOPIC
Bilirubin Urine: NEGATIVE
Hgb urine dipstick: NEGATIVE
Ketones, ur: NEGATIVE
Leukocytes,Ua: NEGATIVE
Nitrite: NEGATIVE
RBC / HPF: NONE SEEN (ref 0–?)
Specific Gravity, Urine: 1.025 (ref 1.000–1.030)
Total Protein, Urine: NEGATIVE
Urine Glucose: NEGATIVE
Urobilinogen, UA: 0.2 (ref 0.0–1.0)
pH: 5.5 (ref 5.0–8.0)

## 2019-03-08 LAB — LIPID PANEL
Cholesterol: 127 mg/dL (ref 0–200)
HDL: 46.9 mg/dL (ref >39.00)
LDL Cholesterol: 60 mg/dL (ref 0–99)
NonHDL: 79.61
Total CHOL/HDL Ratio: 3
Triglycerides: 97 mg/dL (ref 0.0–149.0)
VLDL: 19.4 mg/dL (ref 0.0–40.0)

## 2019-03-08 LAB — HEMOGLOBIN A1C: Hgb A1c MFr Bld: 5.6 % (ref 4.6–6.5)

## 2019-03-08 LAB — CBC WITH DIFFERENTIAL/PLATELET
Basophils Absolute: 0.1 10*3/uL (ref 0.0–0.1)
Basophils Relative: 0.6 % (ref 0.0–3.0)
Eosinophils Absolute: 0.1 10*3/uL (ref 0.0–0.7)
Eosinophils Relative: 0.7 % (ref 0.0–5.0)
HCT: 45.2 % (ref 39.0–52.0)
Hemoglobin: 15.1 g/dL (ref 13.0–17.0)
Lymphocytes Relative: 15.4 % (ref 12.0–46.0)
Lymphs Abs: 1.3 10*3/uL (ref 0.7–4.0)
MCHC: 33.5 g/dL (ref 30.0–36.0)
MCV: 89.3 fl (ref 78.0–100.0)
Monocytes Absolute: 0.7 10*3/uL (ref 0.1–1.0)
Monocytes Relative: 8.7 % (ref 3.0–12.0)
Neutro Abs: 6.2 10*3/uL (ref 1.4–7.7)
Neutrophils Relative %: 74.6 % (ref 43.0–77.0)
Platelets: 162 10*3/uL (ref 150.0–400.0)
RBC: 5.06 Mil/uL (ref 4.22–5.81)
RDW: 13.6 % (ref 11.5–15.5)
WBC: 8.3 10*3/uL (ref 4.0–10.5)

## 2019-03-08 LAB — TSH: TSH: 3.06 u[IU]/mL (ref 0.35–4.50)

## 2019-03-08 LAB — VITAMIN D 25 HYDROXY (VIT D DEFICIENCY, FRACTURES): VITD: 22.8 ng/mL — ABNORMAL LOW (ref 30.00–100.00)

## 2019-03-08 LAB — VITAMIN B12: Vitamin B-12: 313 pg/mL (ref 211–911)

## 2019-03-08 LAB — PSA: PSA: 4 ng/mL (ref 0.10–4.00)

## 2019-03-08 MED ORDER — CLONAZEPAM 1 MG PO TABS: 1.0000 mg | ORAL_TABLET | Freq: Two times a day (BID) | ORAL | 5 refills | Status: DC | PRN

## 2019-03-08 MED ORDER — LEVOTHYROXINE SODIUM 100 MCG PO TABS: 100.0000 ug | ORAL_TABLET | Freq: Every day | ORAL | 3 refills | Status: DC

## 2019-03-08 MED ORDER — SERTRALINE HCL 100 MG PO TABS: 200.0000 mg | ORAL_TABLET | Freq: Every day | ORAL | 3 refills | Status: DC

## 2019-03-08 MED ORDER — TRIAMCINOLONE ACETONIDE 55 MCG/ACT NA AERO: 2.0000 | INHALATION_SPRAY | Freq: Every day | NASAL | 12 refills | Status: DC

## 2019-03-08 MED ORDER — ATENOLOL 25 MG PO TABS: 25.0000 mg | ORAL_TABLET | Freq: Every day | ORAL | 3 refills | Status: DC

## 2019-03-08 MED ORDER — ATORVASTATIN CALCIUM 20 MG PO TABS: 20.0000 mg | ORAL_TABLET | Freq: Every day | ORAL | 3 refills | Status: DC

## 2019-03-08 MED ORDER — VITAMIN D (ERGOCALCIFEROL) 1.25 MG (50000 UNIT) PO CAPS: 50000.0000 [IU] | ORAL_CAPSULE | ORAL | 0 refills | Status: DC

## 2019-03-08 NOTE — Assessment & Plan Note (Signed)
Prefers diet to med tx,  to f/u any worsening symptoms or concerns

## 2019-03-08 NOTE — Assessment & Plan Note (Signed)
stable overall by history and exam, recent data reviewed with pt, and pt to continue medical treatment as before,  to f/u any worsening symptoms or concerns  

## 2019-03-08 NOTE — Assessment & Plan Note (Signed)

## 2019-03-08 NOTE — Patient Instructions (Addendum)

## 2019-03-08 NOTE — Progress Notes (Signed)
Subjective:    Patient ID: Christopher Arias, male    DOB: 01/28/1961, 58 y.o.   MRN: [id number]  HPI  Here for wellness and f/u;  Overall doing ok;  Pt denies Chest pain, worsening SOB, DOE, wheezing, orthopnea, PND, worsening LE edema, palpitations, dizziness or syncope.  Pt denies neurological change such as new headache, facial or extremity weakness.  Pt denies polydipsia, polyuria, or low sugar symptoms. Pt states overall good compliance with treatment and medications, good tolerability, and has been trying to follow appropriate diet.  Pt denies worsening depressive symptoms, suicidal ideation or panic. No fever, night sweats, wt loss, loss of appetite, or other constitutional symptoms.  Pt states good ability with ADL's, has low fall risk, home safety reviewed and adequate, no other significant changes in hearing or vision, and only occasionally active with exercise.  Denies hyper or hypo thyroid symptoms such as voice, skin or hair change. Does have several wks ongoing nasal allergy symptoms with clearish congestion, itch and sneezing, without fever, pain, ST, cough, swelling or wheezing.  No other new complaints Past Medical History:  Diagnosis Date  . ANTERIOR PITUITARY HYPERFUNCTION 11/23/2009  . ANXIETY 11/24/2007  . GERD 11/24/2007  . GYNECOMASTIA 11/10/2009  . HYPERLIPIDEMIA 11/24/2007  . HYPOTHYROIDISM, POST-RADIATION 03/16/2010   Past Surgical History:  Procedure Laterality Date  . INGUINAL HERNIA REPAIR      reports that he has been smoking cigarettes. He has been smoking about 0.50 packs per day. He has never used smokeless tobacco. He reports current alcohol use. He reports that he does not use drugs. family history includes COPD in an other family member; Heart disease in his father and another family member; Stroke in an other family member. No Known Allergies Current Outpatient Medications on File Prior to Visit  Medication Sig Dispense Refill  . aspirin 81 MG EC tablet Take 1  tablet (81 mg total) by mouth daily. Swallow whole. 30 tablet 12  . atenolol (TENORMIN) 25 MG tablet Take 1 tablet (25 mg total) by mouth daily. 90 tablet 3  . atorvastatin (LIPITOR) 20 MG tablet TAKE 1 TABLET BY MOUTH EVERY DAY 90 tablet 3  . clonazePAM (KLONOPIN) 1 MG tablet TAKE 1 TABLET (1 MG TOTAL) BY MOUTH 2 (TWO) TIMES DAILY AS NEEDED FOR ANXIETY. 60 tablet 0  . levothyroxine (SYNTHROID) 100 MCG tablet Take 1 tablet (100 mcg total) by mouth daily. **OFFICE VISIT DUE** 15 tablet 0  . lidocaine-hydrocortisone (ANAMANTEL HC) 3-0.5 % CREA PLACE 1 APPLICATORFUL RECTALLY 2 (TWO) TIMES DAILY. 28.3 g 1  . sertraline (ZOLOFT) 100 MG tablet TAKE 2 TABLETS BY MOUTH EVERY DAY 180 tablet 1   No current facility-administered medications on file prior to visit.    Review of Systems Constitutional: Negative for other unusual diaphoresis, sweats, appetite or weight changes HENT: Negative for other worsening hearing loss, ear pain, facial swelling, mouth sores or neck stiffness.   Eyes: Negative for other worsening pain, redness or other visual disturbance.  Respiratory: Negative for other stridor or swelling Cardiovascular: Negative for other palpitations or other chest pain  Gastrointestinal: Negative for worsening diarrhea or loose stools, blood in stool, distention or other pain Genitourinary: Negative for hematuria, flank pain or other change in urine volume.  Musculoskeletal: Negative for myalgias or other joint swelling.  Skin: Negative for other color change, or other wound or worsening drainage.  Neurological: Negative for other syncope or numbness. Hematological: Negative for other adenopathy or swelling Psychiatric/Behavioral: Negative for hallucinations, other  worsening agitation, SI, self-injury, or new decreased concentration All otherwise neg per pt     Objective:   Physical Exam BP 122/80   Pulse 100   Temp 98.5 F (36.9 C) (Oral)   Ht 5\' 4"  (1.626 m)   Wt 154 lb (69.9 kg)    SpO2 93%   BMI 26.43 kg/m  VS noted,  Constitutional: Pt is oriented to person, place, and time. Appears well-developed and well-nourished, in no significant distress and comfortable Head: Normocephalic and atraumatic  Eyes: Conjunctivae and EOM are normal. Pupils are equal, round, and reactive to light Right Ear: External ear normal without discharge Left Ear: External ear normal without discharge Nose: Nose without discharge or deformity Mouth/Throat: Oropharynx is without other ulcerations and moist  Neck: Normal range of motion. Neck supple. No JVD present. No tracheal deviation present or significant neck LA or mass Cardiovascular: Normal rate, regular rhythm, normal heart sounds and intact distal pulses.   Pulmonary/Chest: WOB normal and breath sounds without rales or wheezing  Abdominal: Soft. Bowel sounds are normal. NT. No HSM  Musculoskeletal: Normal range of motion. Exhibits no edema Lymphadenopathy: Has no other cervical adenopathy.  Neurological: Pt is alert and oriented to person, place, and time. Pt has normal reflexes. No cranial nerve deficit. Motor grossly intact, Gait intact Skin: Skin is warm and dry. No rash noted or new ulcerations Psychiatric:  Has normal mood and affect. Behavior is normal without agitation] No other exam findings Lab Results  Component Value Date   WBC 8.3 03/08/2019   HGB 15.1 03/08/2019   HCT 45.2 03/08/2019   PLT 162.0 03/08/2019   GLUCOSE 96 03/08/2019   CHOL 127 03/08/2019   TRIG 97.0 03/08/2019   HDL 46.90 03/08/2019   LDLCALC 60 03/08/2019   ALT 17 03/08/2019   AST 14 03/08/2019   NA 138 03/08/2019   K 4.4 03/08/2019   CL 108 03/08/2019   CREATININE 0.92 03/08/2019   BUN 19 03/08/2019   CO2 24 03/08/2019   TSH 3.06 03/08/2019   PSA 4.00 03/08/2019   HGBA1C 5.6 03/08/2019        Assessment & Plan:

## 2019-05-25 ENCOUNTER — Other Ambulatory Visit: Payer: Self-pay | Admitting: Internal Medicine

## 2019-05-25 NOTE — Telephone Encounter (Signed)
No need for further high dose vit d   plan to change to OTC Vitamin D3 at 2000 units per day, indefinitely.

## 2019-05-26 NOTE — Telephone Encounter (Signed)
Tried calling pt there was no answer LMOM w/MD response.Marland KitchenJohny Arias

## 2019-09-08 ENCOUNTER — Ambulatory Visit: Payer: BC Managed Care – PPO | Admitting: Internal Medicine

## 2019-09-08 ENCOUNTER — Encounter: Payer: Self-pay | Admitting: Internal Medicine

## 2019-09-08 ENCOUNTER — Other Ambulatory Visit: Payer: Self-pay

## 2019-09-08 VITALS — BP 146/82 | HR 94 | Temp 97.9°F | Ht 64.0 in | Wt 155.6 lb

## 2019-09-08 DIAGNOSIS — R739 Hyperglycemia, unspecified: Secondary | ICD-10-CM | POA: Diagnosis not present

## 2019-09-08 DIAGNOSIS — E782 Mixed hyperlipidemia: Secondary | ICD-10-CM

## 2019-09-08 DIAGNOSIS — E559 Vitamin D deficiency, unspecified: Secondary | ICD-10-CM | POA: Diagnosis not present

## 2019-09-08 DIAGNOSIS — Z0000 Encounter for general adult medical examination without abnormal findings: Secondary | ICD-10-CM

## 2019-09-08 DIAGNOSIS — R000 Tachycardia, unspecified: Secondary | ICD-10-CM

## 2019-09-08 DIAGNOSIS — J4531 Mild persistent asthma with (acute) exacerbation: Secondary | ICD-10-CM | POA: Insufficient documentation

## 2019-09-08 MED ORDER — SERTRALINE HCL 100 MG PO TABS: 200.0000 mg | ORAL_TABLET | Freq: Every day | ORAL | 3 refills | Status: DC

## 2019-09-08 MED ORDER — TRIAMCINOLONE ACETONIDE 55 MCG/ACT NA AERO: 2.0000 | INHALATION_SPRAY | Freq: Every day | NASAL | 12 refills | Status: DC

## 2019-09-08 MED ORDER — LEVOTHYROXINE SODIUM 100 MCG PO TABS: 100.0000 ug | ORAL_TABLET | Freq: Every day | ORAL | 3 refills | Status: DC

## 2019-09-08 MED ORDER — METHYLPREDNISOLONE ACETATE 80 MG/ML IJ SUSP
80.0000 mg | Freq: Once | INTRAMUSCULAR | Status: AC
  Administered 2019-09-08: 17:00:00 80 mg via INTRAMUSCULAR

## 2019-09-08 MED ORDER — CLONAZEPAM 1 MG PO TABS: 1.0000 mg | ORAL_TABLET | Freq: Two times a day (BID) | ORAL | 5 refills | Status: DC | PRN

## 2019-09-08 MED ORDER — FLUTICASONE-SALMETEROL 250-50 MCG/DOSE IN AEPB: 1.0000 | INHALATION_SPRAY | Freq: Two times a day (BID) | RESPIRATORY_TRACT | 11 refills | Status: DC

## 2019-09-08 MED ORDER — ATORVASTATIN CALCIUM 20 MG PO TABS: 20.0000 mg | ORAL_TABLET | Freq: Every day | ORAL | 3 refills | Status: DC

## 2019-09-08 MED ORDER — ALBUTEROL SULFATE HFA 108 (90 BASE) MCG/ACT IN AERS: 2.0000 | INHALATION_SPRAY | Freq: Four times a day (QID) | RESPIRATORY_TRACT | 11 refills | Status: DC | PRN

## 2019-09-08 NOTE — Assessment & Plan Note (Addendum)
Mild to mod, for depomedrol 80 IM, albuterol and advair asd,  to f/u any worsening symptoms or concerns  I spent 31 minutes in preparing to see the patient by review of recent labs, imaging and procedures, obtaining and reviewing separately obtained history, communicating with the patient and family or caregiver, ordering medications, tests or procedures, and documenting clinical information in the EHR including the differential Dx, treatment, and any further evaluation and other management of asthma exacerbation, sinus tach, vit d deficiency, HLD, hyperglycemia

## 2019-09-08 NOTE — Assessment & Plan Note (Signed)
Symptomatically resolved  to f/u any worsening symptoms or concerns

## 2019-09-08 NOTE — Patient Instructions (Signed)
You had the steroid shot today  Please take all new medication as prescribed - the albuterol inhaler as needed, and the generic advair inhaler twice per day  Please continue all other medications as before, and refills have been done if requested.  Please have the pharmacy call with any other refills you may need.  Please continue your efforts at being more active, low cholesterol diet, and weight control.  You are otherwise up to date with prevention measures today.  Please keep your appointments with your specialists as you may have planned  Please make an Appointment to return in 6 months, or sooner if needed, also with Lab Appointment for testing done 3-5 days before at the [facility] (so this is for TWO appointments - please see the scheduling desk as you leave)

## 2019-09-08 NOTE — Assessment & Plan Note (Signed)
stable overall by history and exam, recent data reviewed with pt, and pt to continue medical treatment as before,  to f/u any worsening symptoms or concerns  

## 2019-09-08 NOTE — Progress Notes (Signed)
   Subjective:    Patient ID: Christopher Arias, male    DOB: 09-03-1960, 59 y.o.   MRN: [id number]  HPI Here to f/u; overall doing ok,  Pt denies chest pain, orthopnea, PND, increased LE swelling, palpitations, dizziness or syncope, but has 2 wks worsening sob,doe and wheezing .  Pt denies new neurological symptoms such as new headache, or facial or extremity weakness or numbness.  Pt denies polydipsia, polyuria, or low sugar episode.  Pt states overall good compliance with meds, mostly trying to follow appropriate diet, with wt overall stable,   No longer needs med for sinus tachycardia - stopped the atenolol.  Past Medical History:  Diagnosis Date  . ANTERIOR PITUITARY HYPERFUNCTION 11/23/2009  . ANXIETY 11/24/2007  . GERD 11/24/2007  . GYNECOMASTIA 11/10/2009  . HYPERLIPIDEMIA 11/24/2007  . HYPOTHYROIDISM, POST-RADIATION 03/16/2010   Past Surgical History:  Procedure Laterality Date  . INGUINAL HERNIA REPAIR      reports that he has been smoking cigarettes. He has been smoking about 0.50 packs per day. He has never used smokeless tobacco. He reports current alcohol use. He reports that he does not use drugs. family history includes COPD in an other family member; Heart disease in his father and another family member; Stroke in an other family member. No Known Allergies Current Outpatient Medications on File Prior to Visit  Medication Sig Dispense Refill  . aspirin 81 MG EC tablet Take 1 tablet (81 mg total) by mouth daily. Swallow whole. 30 tablet 12  . lidocaine-hydrocortisone (ANAMANTEL HC) 3-0.5 % CREA PLACE 1 APPLICATORFUL RECTALLY 2 (TWO) TIMES DAILY. 28.3 g 1   No current facility-administered medications on file prior to visit.   Review of Systems All otherwise neg per pt     Objective:   Physical Exam BP (!) 146/82   Pulse 94   Temp 97.9 F (36.6 C)   Ht 5\' 4"  (1.626 m)   Wt 155 lb 9.6 oz (70.6 kg)   SpO2 98%   BMI 26.71 kg/m  VS noted,  Constitutional: Pt appears in  NAD HENT: Head: NCAT.  Right Ear: External ear normal.  Left Ear: External ear normal.  Eyes: . Pupils are equal, round, and reactive to light. Conjunctivae and EOM are normal Nose: without d/c or deformity Neck: Neck supple. Gross normal ROM Cardiovascular: Normal rate and regular rhythm.   Pulmonary/Chest: Effort normal and breath sounds without rales but with mild diffuse wheezing.  Abd:  Soft, NT, ND, + BS, no organomegaly Neurological: Pt is alert. At baseline orientation, motor grossly intact Skin: Skin is warm. No rashes, other new lesions, no LE edema Psychiatric: Pt behavior is normal without agitation  All otherwise neg per pt Lab Results  Component Value Date   WBC 8.3 03/08/2019   HGB 15.1 03/08/2019   HCT 45.2 03/08/2019   PLT 162.0 03/08/2019   GLUCOSE 96 03/08/2019   CHOL 127 03/08/2019   TRIG 97.0 03/08/2019   HDL 46.90 03/08/2019   LDLCALC 60 03/08/2019   ALT 17 03/08/2019   AST 14 03/08/2019   NA 138 03/08/2019   K 4.4 03/08/2019   CL 108 03/08/2019   CREATININE 0.92 03/08/2019   BUN 19 03/08/2019   CO2 24 03/08/2019   TSH 3.06 03/08/2019   PSA 4.00 03/08/2019   HGBA1C 5.6 03/08/2019      Assessment & Plan:

## 2019-09-08 NOTE — Assessment & Plan Note (Signed)
To cont oral replacement, for f/u lab next visit

## 2019-11-12 ENCOUNTER — Encounter: Payer: Self-pay | Admitting: Family

## 2019-11-12 ENCOUNTER — Other Ambulatory Visit: Payer: Self-pay

## 2019-11-12 ENCOUNTER — Ambulatory Visit: Payer: BC Managed Care – PPO | Admitting: Family

## 2019-11-12 VITALS — BP 134/78 | HR 60 | Temp 98.2°F | Ht 64.0 in | Wt 156.4 lb

## 2019-11-12 DIAGNOSIS — R599 Enlarged lymph nodes, unspecified: Secondary | ICD-10-CM

## 2019-11-12 MED ORDER — DOXYCYCLINE HYCLATE 100 MG PO TABS: 100.0000 mg | ORAL_TABLET | Freq: Two times a day (BID) | ORAL | 0 refills | Status: DC

## 2019-11-12 NOTE — Progress Notes (Signed)
Christopher Arias is a 59 y.o. male with the following history as recorded in [employer]:  Patient Active Problem List   Diagnosis Date Noted  . Vitamin D deficiency 09/08/2019  . Mild persistent asthma with exacerbation 09/08/2019  . Hyperglycemia 03/08/2019  . Allergic rhinitis 03/08/2019  . External hemorrhoid, thrombosed 01/14/2018  . Insomnia 08/28/2016  . Cough 06/30/2015  . Inappropriate sinus tachycardia 02/03/2015  . Smoker 03/12/2012  . Hidradenitis 12/19/2010  . Preventative health care 11/13/2010  . Special screening for malignant neoplasm of prostate 08/23/2010  . Hypothyroidism 03/16/2010  . ANTERIOR PITUITARY HYPERFUNCTION 11/23/2009  . GYNECOMASTIA 11/10/2009  . Hyperlipidemia 11/24/2007  . Anxiety state 11/24/2007  . GERD 11/24/2007    Current Outpatient Medications  Medication Sig Dispense Refill  . albuterol (VENTOLIN HFA) 108 (90 Base) MCG/ACT inhaler Inhale 2 puffs into the lungs every 6 (six) hours as needed for wheezing or shortness of breath. 18 g 11  . aspirin 81 MG EC tablet Take 1 tablet (81 mg total) by mouth daily. Swallow whole. 30 tablet 12  . atorvastatin (LIPITOR) 20 MG tablet Take 1 tablet (20 mg total) by mouth daily. 90 tablet 3  . Fluticasone-Salmeterol (ADVAIR DISKUS) 250-50 MCG/DOSE AEPB Inhale 1 puff into the lungs 2 (two) times daily. 1 each 11  . levothyroxine (SYNTHROID) 100 MCG tablet Take 1 tablet (100 mcg total) by mouth daily. 90 tablet 3  . lidocaine-hydrocortisone (ANAMANTEL HC) 3-0.5 % CREA PLACE 1 APPLICATORFUL RECTALLY 2 (TWO) TIMES DAILY. 28.3 g 1  . sertraline (ZOLOFT) 100 MG tablet Take 2 tablets (200 mg total) by mouth daily. 180 tablet 3  . triamcinolone (NASACORT) 55 MCG/ACT AERO nasal inhaler Place 2 sprays into the nose daily. 1 Inhaler 12  . clonazePAM (KLONOPIN) 1 MG tablet Take 1 tablet (1 mg total) by mouth 2 (two) times daily as needed for anxiety. (Patient not taking: Reported on 11/12/2019) 60 tablet 5  . doxycycline  (VIBRA-TABS) 100 MG tablet Take 1 tablet (100 mg total) by mouth 2 (two) times daily. 20 tablet 0   No current facility-administered medications for this visit.    Allergies: Patient has no known allergies.  Past Medical History:  Diagnosis Date  . ANTERIOR PITUITARY HYPERFUNCTION 11/23/2009  . ANXIETY 11/24/2007  . GERD 11/24/2007  . GYNECOMASTIA 11/10/2009  . HYPERLIPIDEMIA 11/24/2007  . HYPOTHYROIDISM, POST-RADIATION 03/16/2010    Past Surgical History:  Procedure Laterality Date  . INGUINAL HERNIA REPAIR      Family History  Problem Relation Age of Onset  . Heart disease Father   . Stroke Other   . COPD Other   . Heart disease Other   . Colon cancer Neg Hx     Social History   Tobacco Use  . Smoking status: Current Every Day Smoker    Packs/day: 0.50    Types: Cigarettes  . Smokeless tobacco: Never Used  Substance Use Topics  . 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 Localized raised, tender area on left side of neck Head: Normocephalic and atraumatic  Lungs: Respirations unlabored;  Neurologic: Alert and oriented; speech intact; face symmetrical; moves all extremities well; CNII-XII intact without focal deficit   Assessment:  1. Reactive lymphadenopathy     Plan:  ? Insect bite vs reactive lymph node; will treat with Doxycyline 100 mg bid x 10 days; apply moist heat to area; if symptoms are not improved by early next week, follow up and will schedule ultrasound.  This visit occurred during the SARS-CoV-2 public  health emergency.  Safety protocols were in place, including screening questions prior to the visit, additional usage of staff PPE, and extensive cleaning of exam room while observing appropriate contact time as indicated for disinfecting solutions.     No follow-ups on file.  No orders of the defined types were placed in this encounter.   Requested Prescriptions   Signed Prescriptions Disp Refills  . doxycycline (VIBRA-TABS) 100 MG tablet 20 tablet 0    Sig: Take 1 tablet (100 mg total) by mouth 2 (two) times daily.

## 2019-11-16 ENCOUNTER — Telehealth: Payer: Self-pay | Admitting: Internal Medicine

## 2019-11-16 NOTE — Telephone Encounter (Signed)
Sent to Dr. Jenny Reichmann for Advise.

## 2019-11-16 NOTE — Telephone Encounter (Signed)
New message:   Pt is calling and states he was told to call back if the bump on his neck still hasn't popped by Monday since his visit on 11/12/19. Pt states it is still there. Please advise.

## 2019-11-16 NOTE — Telephone Encounter (Signed)
Needs rov to reassess

## 2019-11-19 ENCOUNTER — Encounter: Payer: Self-pay | Admitting: Internal Medicine

## 2019-11-19 ENCOUNTER — Ambulatory Visit: Payer: BC Managed Care – PPO | Admitting: Internal Medicine

## 2019-11-19 ENCOUNTER — Other Ambulatory Visit: Payer: Self-pay

## 2019-11-19 DIAGNOSIS — L089 Local infection of the skin and subcutaneous tissue, unspecified: Secondary | ICD-10-CM | POA: Insufficient documentation

## 2019-11-19 DIAGNOSIS — F411 Generalized anxiety disorder: Secondary | ICD-10-CM

## 2019-11-19 DIAGNOSIS — R739 Hyperglycemia, unspecified: Secondary | ICD-10-CM

## 2019-11-19 DIAGNOSIS — L723 Sebaceous cyst: Secondary | ICD-10-CM | POA: Diagnosis not present

## 2019-11-19 NOTE — Progress Notes (Signed)
Subjective:    Patient ID: Christopher Arias, male    DOB: 06-May-1961, 59 y.o.   MRN: [id number]  HPI  Here with c/o persistent raised tedner area to the left mid neck, that is better but persists after 5 days doxy course.  Pt denies chest pain, increased sob or doe, wheezing, orthopnea, PND, increased LE swelling, palpitations, dizziness or syncope.   Pt denies polydipsia, polyuria,  Denies worsening depressive symptoms, suicidal ideation, or panic Past Medical History:  Diagnosis Date  . ANTERIOR PITUITARY HYPERFUNCTION 11/23/2009  . ANXIETY 11/24/2007  . GERD 11/24/2007  . GYNECOMASTIA 11/10/2009  . HYPERLIPIDEMIA 11/24/2007  . HYPOTHYROIDISM, POST-RADIATION 03/16/2010   Past Surgical History:  Procedure Laterality Date  . INGUINAL HERNIA REPAIR      reports that he has been smoking cigarettes. He has been smoking about 0.50 packs per day. He has never used smokeless tobacco. He reports current alcohol use. He reports that he does not use drugs. family history includes COPD in an other family member; Heart disease in his father and another family member; Stroke in an other family member. No Known Allergies Current Outpatient Medications on File Prior to Visit  Medication Sig Dispense Refill  . albuterol (VENTOLIN HFA) 108 (90 Base) MCG/ACT inhaler Inhale 2 puffs into the lungs every 6 (six) hours as needed for wheezing or shortness of breath. 18 g 11  . aspirin 81 MG EC tablet Take 1 tablet (81 mg total) by mouth daily. Swallow whole. 30 tablet 12  . atorvastatin (LIPITOR) 20 MG tablet Take 1 tablet (20 mg total) by mouth daily. 90 tablet 3  . doxycycline (VIBRA-TABS) 100 MG tablet Take 1 tablet (100 mg total) by mouth 2 (two) times daily. 20 tablet 0  . Fluticasone-Salmeterol (ADVAIR DISKUS) 250-50 MCG/DOSE AEPB Inhale 1 puff into the lungs 2 (two) times daily. 1 each 11  . levothyroxine (SYNTHROID) 100 MCG tablet Take 1 tablet (100 mcg total) by mouth daily. 90 tablet 3  .  lidocaine-hydrocortisone (ANAMANTEL HC) 3-0.5 % CREA PLACE 1 APPLICATORFUL RECTALLY 2 (TWO) TIMES DAILY. 28.3 g 1  . sertraline (ZOLOFT) 100 MG tablet Take 2 tablets (200 mg total) by mouth daily. 180 tablet 3  . triamcinolone (NASACORT) 55 MCG/ACT AERO nasal inhaler Place 2 sprays into the nose daily. 1 Inhaler 12   No current facility-administered medications on file prior to visit.   Review of Systems All otherwise neg per pt     Objective:   Physical Exam BP 130/76 (BP Location: Left Arm, Patient Position: Sitting, Cuff Size: Large)   Pulse 93   Temp 98.2 F (36.8 C) (Oral)   Ht 5\' 4"  (1.626 m)   Wt 141 lb (64 kg)   SpO2 97%   BMI 24.20 kg/m  VS noted,  Constitutional: Pt appears in NAD HENT: Head: NCAT.  Right Ear: External ear normal.  Left Ear: External ear normal.  Eyes: . Pupils are equal, round, and reactive to light. Conjunctivae and EOM are normal Nose: without d/c or deformity Neck: Neck supple. Gross normal ROM, left mid neck with raised lesion mild erythema, mild tender, firm but not hard, not mobile, non fluctuant Cardiovascular: Normal rate and regular rhythm.   Pulmonary/Chest: Effort normal and breath sounds without rales or wheezing.  Neurological: Pt is alert. At baseline orientation, motor grossly intact Skin: Skin is warm. No rashes, other new lesions, no LE edema Psychiatric: Pt behavior is normal without agitation  All otherwise neg per pt  Lab Results  Component Value Date   WBC 8.3 03/08/2019   HGB 15.1 03/08/2019   HCT 45.2 03/08/2019   PLT 162.0 03/08/2019   GLUCOSE 96 03/08/2019   CHOL 127 03/08/2019   TRIG 97.0 03/08/2019   HDL 46.90 03/08/2019   LDLCALC 60 03/08/2019   ALT 17 03/08/2019   AST 14 03/08/2019   NA 138 03/08/2019   K 4.4 03/08/2019   CL 108 03/08/2019   CREATININE 0.92 03/08/2019   BUN 19 03/08/2019   CO2 24 03/08/2019   TSH 3.06 03/08/2019   PSA 4.00 03/08/2019   HGBA1C 5.6 03/08/2019      Assessment & Plan:

## 2019-11-19 NOTE — Patient Instructions (Signed)
Ok to finish your antibiotic  Please continue all other medications as before, and refills have been done if requested.  Please have the pharmacy call with any other refills you may need.  Please keep your appointments with your specialists as you may have planned  You will be contacted regarding the referral for: ENT asap - Dr Lucia Gaskins if ok with your insurance

## 2019-11-20 ENCOUNTER — Encounter: Payer: Self-pay | Admitting: Internal Medicine

## 2019-11-20 NOTE — Assessment & Plan Note (Signed)
stable overall by history and exam, recent data reviewed with pt, and pt to continue medical treatment as before,  to f/u any worsening symptoms or concerns  

## 2019-11-20 NOTE — Assessment & Plan Note (Addendum)
Improved but likely needs excision - for ent referral, to finish doxy course  I spent 21 minutes in preparing to see the patient by review of recent labs, imaging and procedures, obtaining and reviewing separately obtained history, communicating with the patient and family or caregiver, ordering medications, tests or procedures, and documenting clinical information in the EHR including the differential Dx, treatment, and any further evaluation and other management of sebacous, cyst, hyperglycemia, anxiety

## 2019-12-03 ENCOUNTER — Ambulatory Visit (INDEPENDENT_AMBULATORY_CARE_PROVIDER_SITE_OTHER): Payer: BC Managed Care – PPO | Admitting: Otolaryngology

## 2019-12-03 ENCOUNTER — Other Ambulatory Visit: Payer: Self-pay

## 2019-12-03 VITALS — Temp 97.5°F

## 2019-12-03 DIAGNOSIS — H6123 Impacted cerumen, bilateral: Secondary | ICD-10-CM | POA: Diagnosis not present

## 2019-12-03 DIAGNOSIS — L720 Epidermal cyst: Secondary | ICD-10-CM | POA: Diagnosis not present

## 2019-12-03 NOTE — Progress Notes (Signed)
HPI: Christopher Arias is a 59 y.o. male who presents is referred by Dr. Jenny Reichmann for evaluation of left neck cyst.  Patient has had this lesion present for about a month now.  It has not caused much pain or discomfort..  Past Medical History:  Diagnosis Date  . ANTERIOR PITUITARY HYPERFUNCTION 11/23/2009  . ANXIETY 11/24/2007  . GERD 11/24/2007  . GYNECOMASTIA 11/10/2009  . HYPERLIPIDEMIA 11/24/2007  . HYPOTHYROIDISM, POST-RADIATION 03/16/2010   Past Surgical History:  Procedure Laterality Date  . INGUINAL HERNIA REPAIR     Social History   Socioeconomic History  . Marital status: Married    Spouse name: Not on file  . Number of children: 0  . Years of education: Not on file  . Highest education level: Not on file  Occupational History    Employer: [employer]  Tobacco Use  . Smoking status: Current Every Day Smoker    Packs/day: 0.50    Types: Cigarettes  . Smokeless tobacco: Never Used  Substance and Sexual Activity  . Alcohol use: Yes    Comment: rarely  . Drug use: No  . Sexual activity: Not on file  Other Topics Concern  . Not on file  Social History Narrative  . Not on file   Social Determinants of Health   Financial Resource Strain:   . Difficulty of Paying Living Expenses:   Food Insecurity:   . Worried About Charity fundraiser in the Last Year:   . Arboriculturist in the Last Year:   Transportation Needs:   . Film/video editor (Medical):   Marland Kitchen Lack of Transportation (Non-Medical):   Physical Activity:   . Days of Exercise per Week:   . Minutes of Exercise per Session:   Stress:   . Feeling of Stress :   Social Connections:   . Frequency of Communication with Friends and Family:   . Frequency of Social Gatherings with Friends and Family:   . Attends Religious Services:   . Active Member of Clubs or Organizations:   . Attends Archivist Meetings:   Marland Kitchen Marital Status:    Family History  Problem Relation Age of Onset  . Heart disease Father   . Stroke  Other   . COPD Other   . Heart disease Other   . Colon cancer Neg Hx    No Known Allergies Prior to Admission medications   Medication Sig Start Date End Date Taking? Authorizing Provider  albuterol (VENTOLIN HFA) 108 (90 Base) MCG/ACT inhaler Inhale 2 puffs into the lungs every 6 (six) hours as needed for wheezing or shortness of breath. 09/08/19  Yes Biagio Borg, MD  aspirin 81 MG EC tablet Take 1 tablet (81 mg total) by mouth daily. Swallow whole. 03/11/12  Yes Biagio Borg, MD  atorvastatin (LIPITOR) 20 MG tablet Take 1 tablet (20 mg total) by mouth daily. 09/08/19  Yes Biagio Borg, MD  doxycycline (VIBRA-TABS) 100 MG tablet Take 1 tablet (100 mg total) by mouth 2 (two) times daily. 11/12/19  Yes Marrian Salvage, FNP  Fluticasone-Salmeterol (ADVAIR DISKUS) 250-50 MCG/DOSE AEPB Inhale 1 puff into the lungs 2 (two) times daily. 09/08/19 09/07/20 Yes Biagio Borg, MD  levothyroxine (SYNTHROID) 100 MCG tablet Take 1 tablet (100 mcg total) by mouth daily. 09/08/19  Yes Biagio Borg, MD  lidocaine-hydrocortisone (ANAMANTEL HC) 3-0.5 % CREA PLACE 1 APPLICATORFUL RECTALLY 2 (TWO) TIMES DAILY. 04/09/18  Yes Biagio Borg, MD  sertraline (  ZOLOFT) 100 MG tablet Take 2 tablets (200 mg total) by mouth daily. 09/08/19  Yes Biagio Borg, MD  triamcinolone (NASACORT) 55 MCG/ACT AERO nasal inhaler Place 2 sprays into the nose daily. 09/08/19  Yes Biagio Borg, MD     Positive ROS: Otherwise negative  All other systems have been reviewed and were otherwise negative with the exception of those mentioned in the HPI and as above.  Physical Exam: Constitutional: Alert, well-appearing, no acute distress Ears: External ears without lesions or tenderness.  He has large amount of wax buildup in both ears that was cleaned in the office today with a curette.  TMs were clear bilaterally.  He has moderate bilateral SNHL and has hearing aids but does not wear them often. Nasal: External nose without lesions.  Clear  nasal passages Oral: Lips and gums without lesions. Tongue and palate mucosa without lesions. Posterior oropharynx clear. Neck: No palpable adenopathy.  Patient has a dermoid cyst on the left neck measuring approximately 12 x 16 mm in size.  It is not painful to palpation has a central tract. Respiratory: Breathing comfortably  Skin: No facial/neck lesions or rash noted.  Cerumen impaction removal  Date/Time: 12/03/2019 4:38 PM Performed by: Rozetta Nunnery, MD Authorized by: Rozetta Nunnery, MD   Consent:    Consent obtained:  Verbal   Consent given by:  Patient   Risks discussed:  Pain and bleeding Procedure details:    Location:  L ear and R ear   Procedure type: curette   Post-procedure details:    Inspection:  TM intact and canal normal   Hearing quality:  Improved   Patient tolerance of procedure:  Tolerated well, no immediate complications Comments:     Patient with a large amount of wax in both ear canals that was cleaned in the office.  TMs were clear bilaterally.    Assessment: Patient with a left neck epidermoid cyst.  Plan: I discussed with him concerning keeping this clean with soap and water and that it should gradually resolve or decrease in size.  If it persists I discussed with him concerning excision under local anesthetic.  He is agreeable with this and will call us back in 3 to 4 weeks if it persists or enlarges.   Radene Journey, MD   CC:

## 2020-03-07 ENCOUNTER — Telehealth: Payer: Self-pay | Admitting: Internal Medicine

## 2020-03-09 ENCOUNTER — Other Ambulatory Visit: Payer: Self-pay

## 2020-03-09 ENCOUNTER — Other Ambulatory Visit: Payer: Self-pay | Admitting: Internal Medicine

## 2020-03-09 NOTE — Telephone Encounter (Signed)
Patient's wife is calling stating the patient has been on this medication for years and he needs his refill sent in.

## 2020-03-09 NOTE — Telephone Encounter (Signed)
Done erx 

## 2020-03-10 MED ORDER — CLONAZEPAM 1 MG PO TABS: 1.0000 mg | ORAL_TABLET | Freq: Two times a day (BID) | ORAL | 2 refills | Status: DC | PRN

## 2020-03-10 NOTE — Telephone Encounter (Signed)
Sent to Dr. John to Advise. °

## 2020-03-10 NOTE — Telephone Encounter (Signed)
Since the medication was not refused and sent to the pharmacy oct 7, I am at a loss to understand this issue  I resent the medication

## 2020-03-10 NOTE — Addendum Note (Signed)
Addended by: Biagio Borg on: 03/10/2020 01:02 PM   Modules accepted: Orders

## 2020-06-19 ENCOUNTER — Encounter: Payer: Self-pay | Admitting: Internal Medicine

## 2020-06-19 ENCOUNTER — Telehealth: Payer: Self-pay | Admitting: Internal Medicine

## 2020-06-19 ENCOUNTER — Other Ambulatory Visit: Payer: Self-pay | Admitting: Internal Medicine

## 2020-06-19 DIAGNOSIS — U071 COVID-19: Secondary | ICD-10-CM | POA: Diagnosis not present

## 2020-06-19 DIAGNOSIS — Z20822 Contact with and (suspected) exposure to covid-19: Secondary | ICD-10-CM | POA: Diagnosis not present

## 2020-06-19 NOTE — Telephone Encounter (Signed)
He tested positive 1.17.22, he started having symptoms on 1.16.22 and temperature was 100.4, aches, coughing  Please advise what their next steps are Please call [facility] @ [phone]

## 2020-06-19 NOTE — Telephone Encounter (Signed)
LVM instructing pt to return call or call & schedule virtual visit if needed for meds to address symptoms.

## 2020-06-20 ENCOUNTER — Telehealth (INDEPENDENT_AMBULATORY_CARE_PROVIDER_SITE_OTHER): Payer: BC Managed Care – PPO | Admitting: Internal Medicine

## 2020-06-20 ENCOUNTER — Encounter: Payer: Self-pay | Admitting: Internal Medicine

## 2020-06-20 ENCOUNTER — Other Ambulatory Visit: Payer: Self-pay

## 2020-06-20 DIAGNOSIS — J309 Allergic rhinitis, unspecified: Secondary | ICD-10-CM

## 2020-06-20 DIAGNOSIS — F411 Generalized anxiety disorder: Secondary | ICD-10-CM | POA: Diagnosis not present

## 2020-06-20 DIAGNOSIS — U071 COVID-19: Secondary | ICD-10-CM | POA: Insufficient documentation

## 2020-06-20 MED ORDER — PROMETHAZINE-CODEINE 6.25-10 MG/5ML PO SYRP: 5.0000 mL | ORAL_SOLUTION | Freq: Four times a day (QID) | ORAL | 0 refills | Status: DC | PRN

## 2020-06-20 NOTE — Progress Notes (Signed)
Patient ID: Christopher Arias, male   DOB: 1960/12/02, 60 y.o.   MRN: [id number]  Virtual Visit via Video Note  I connected with Christopher Arias on 06/20/20 at 10:00 AM EST by a video enabled telemedicine application and verified that I am speaking with the correct person using two identifiers.  Location of all participants today Patient: at home Provider: at home   I discussed the limitations of evaluation and management by telemedicine and the availability of in person appointments. The patient expressed understanding and agreed to proceed.  History of Present Illness: Here to f/u after covid exposure at work, symptoms started jan 15, tested + for COVID yesterday jan 17, now calling to discuss any treatment.  Symptoms include myalgias, sinus congestoin (taking vicks for this), low grade temp 100.4, low appetite, minor nausea and diarrhea, but taste and smell intact.  No vomiting.  Has had vaccination but no booster yet.  Taking mucinex at home only.  Pt denies chest pain, increased sob or doe, wheezing, orthopnea, PND, increased LE swelling, palpitations, dizziness or syncope.  Pt denies new neurological symptoms such as new headache, or facial or extremity weakness or numbness   Pt denies polydipsia, polyuria   Allergy symptoms controlled with antihistamine prn, and Denies worsening depressive symptoms, suicidal ideation, or panic; has ongoing anxiety, controlled with klonopin bid prn.   Past Medical History:  Diagnosis Date  . ANTERIOR PITUITARY HYPERFUNCTION 11/23/2009  . ANXIETY 11/24/2007  . GERD 11/24/2007  . GYNECOMASTIA 11/10/2009  . HYPERLIPIDEMIA 11/24/2007  . HYPOTHYROIDISM, POST-RADIATION 03/16/2010   Past Surgical History:  Procedure Laterality Date  . INGUINAL HERNIA REPAIR      reports that he has been smoking cigarettes. He has been smoking about 0.50 packs per day. He has never used smokeless tobacco. He reports current alcohol use. He reports that he does not use drugs. family  history includes COPD in an other family member; Heart disease in his father and another family member; Stroke in an other family member. No Known Allergies Current Outpatient Medications on File Prior to Visit  Medication Sig Dispense Refill  . albuterol (VENTOLIN HFA) 108 (90 Base) MCG/ACT inhaler Inhale 2 puffs into the lungs every 6 (six) hours as needed for wheezing or shortness of breath. 18 g 11  . aspirin 81 MG EC tablet Take 1 tablet (81 mg total) by mouth daily. Swallow whole. 30 tablet 12  . atenolol (TENORMIN) 25 MG tablet Take 25 mg by mouth daily.    Marland Kitchen atorvastatin (LIPITOR) 20 MG tablet Take 1 tablet (20 mg total) by mouth daily. 90 tablet 3  . clonazePAM (KLONOPIN) 1 MG tablet TAKE 1 TABLET BY MOUTH 2 TIMES DAILY AS NEEDED FOR ANXIETY. 60 tablet 5  . doxycycline (VIBRA-TABS) 100 MG tablet Take 1 tablet (100 mg total) by mouth 2 (two) times daily. 20 tablet 0  . Fluticasone-Salmeterol (ADVAIR DISKUS) 250-50 MCG/DOSE AEPB Inhale 1 puff into the lungs 2 (two) times daily. 1 each 11  . levothyroxine (SYNTHROID) 100 MCG tablet Take 1 tablet (100 mcg total) by mouth daily. 90 tablet 3  . lidocaine-hydrocortisone (ANAMANTEL HC) 3-0.5 % CREA PLACE 1 APPLICATORFUL RECTALLY 2 (TWO) TIMES DAILY. 28.3 g 1  . sertraline (ZOLOFT) 100 MG tablet Take 2 tablets (200 mg total) by mouth daily. 180 tablet 3  . triamcinolone (NASACORT) 55 MCG/ACT AERO nasal inhaler Place 2 sprays into the nose daily. 1 Inhaler 12   No current facility-administered medications on file prior to visit.  Observations/Objective: Alert, NAD, appropriate mood and affect, resps normal, cn 2-12 intact, moves all 4s, no visible rash or swelling Lab Results  Component Value Date   WBC 8.3 03/08/2019   HGB 15.1 03/08/2019   HCT 45.2 03/08/2019   PLT 162.0 03/08/2019   GLUCOSE 96 03/08/2019   CHOL 127 03/08/2019   TRIG 97.0 03/08/2019   HDL 46.90 03/08/2019   LDLCALC 60 03/08/2019   ALT 17 03/08/2019   AST 14  03/08/2019   NA 138 03/08/2019   K 4.4 03/08/2019   CL 108 03/08/2019   CREATININE 0.92 03/08/2019   BUN 19 03/08/2019   CO2 24 03/08/2019   TSH 3.06 03/08/2019   PSA 4.00 03/08/2019   HGBA1C 5.6 03/08/2019   Assessment and Plan: See notes  Follow Up Instructions: See notes   I discussed the assessment and treatment plan with the patient. The patient was provided an opportunity to ask questions and all were answered. The patient agreed with the plan and demonstrated an understanding of the instructions.   The patient was advised to call back or seek an in-person evaluation if the symptoms worsen or if the condition fails to improve as anticipated.   Cathlean Cower, MD

## 2020-06-20 NOTE — Patient Instructions (Signed)
Please take all new medication as prescribed 

## 2020-06-20 NOTE — Assessment & Plan Note (Signed)
Stable, cont current antihistamine tx prn

## 2020-06-20 NOTE — Assessment & Plan Note (Signed)
Stable, cont current med tx  - klonopin prn

## 2020-06-20 NOTE — Assessment & Plan Note (Signed)
With overall mild symptoms, for phenergan/codeine cough med prn and mucinex bid prn (has at home); not eligible currently for monoclonal Ab referral as is already vaccinated; declines referral for remidesivir or decadron course for now; advised on natural hx of infection and what to expect, to consider obtaining home oximiter for self monitoring over the next wk, to self isolate for minimum 5 days then ok back to work with mask for minimum 5 days after that

## 2020-07-18 ENCOUNTER — Telehealth: Payer: Self-pay | Admitting: Internal Medicine

## 2020-07-18 MED ORDER — LORAZEPAM 0.5 MG PO TABS: 0.5000 mg | ORAL_TABLET | Freq: Two times a day (BID) | ORAL | 1 refills | Status: DC | PRN

## 2020-07-18 NOTE — Telephone Encounter (Signed)
Patients wife called and said that the patient cannot take clonazePAM (KLONOPIN) 1 MG tablet during the day because it makes the patient sleepy and unable to function. She said it does well for him at night but was wondering if something else could be prescribed for during the day. Please advise.

## 2020-07-18 NOTE — Telephone Encounter (Signed)
[facility] for change to ativan 0.5 gid prn - done erx  Ok to let pt know

## 2020-07-19 ENCOUNTER — Telehealth: Payer: Self-pay

## 2020-07-19 NOTE — Telephone Encounter (Signed)
Patient notified of new med

## 2020-07-25 NOTE — Telephone Encounter (Signed)
Patients wife called and was wondering if the patient needed to be taking clonazePAM (KLONOPIN) 1 MG tab at night and LORazepam (ATIVAN) 0.5 MG tablet during the day or just take LORazepam (ATIVAN) 0.5 MG tablet. She can be reached at [phone]

## 2020-07-25 NOTE — Telephone Encounter (Signed)
Patient states if he doesn't want him to take both he wants to keep the klonopin

## 2020-07-27 NOTE — Telephone Encounter (Signed)
This is quite disturbing to go back and forth on the requests for medication changes for controlled substances by email by two persons   I decline to do anything by follow the original plan for ativan for now only/    Please let me know if pt want to change to klonopin when the ativan rx is done

## 2020-07-28 ENCOUNTER — Telehealth: Payer: Self-pay

## 2020-07-28 NOTE — Telephone Encounter (Signed)
Pt notified of the decline in change from the [facility]

## 2020-09-19 NOTE — Telephone Encounter (Signed)
error 

## 2020-09-30 ENCOUNTER — Other Ambulatory Visit: Payer: Self-pay | Admitting: Internal Medicine

## 2020-10-22 ENCOUNTER — Other Ambulatory Visit: Payer: Self-pay | Admitting: Internal Medicine

## 2020-10-22 NOTE — Telephone Encounter (Signed)
Please refill as per office routine med refill policy (all routine meds refilled for 3 mo or monthly per pt preference up to one year from last visit, then month to month grace period for 3 mo, then further med refills will have to be denied)  

## 2020-11-17 ENCOUNTER — Other Ambulatory Visit: Payer: Self-pay | Admitting: Internal Medicine

## 2020-11-20 ENCOUNTER — Other Ambulatory Visit: Payer: Self-pay | Admitting: Internal Medicine

## 2020-11-22 ENCOUNTER — Ambulatory Visit: Payer: BC Managed Care – PPO | Admitting: Emergency Medicine

## 2020-11-23 ENCOUNTER — Other Ambulatory Visit: Payer: Self-pay | Admitting: Internal Medicine

## 2020-11-23 NOTE — Telephone Encounter (Signed)
Please refill as per office routine med refill policy (all routine meds refilled for 3 mo or monthly per pt preference up to one year from last visit, then month to month grace period for 3 mo, then further med refills will have to be denied)  

## 2020-11-24 ENCOUNTER — Ambulatory Visit: Payer: BC Managed Care – PPO | Admitting: Internal Medicine

## 2020-11-27 ENCOUNTER — Encounter: Payer: Self-pay | Admitting: Internal Medicine

## 2020-11-27 ENCOUNTER — Ambulatory Visit: Payer: BC Managed Care – PPO | Admitting: Internal Medicine

## 2020-11-27 ENCOUNTER — Other Ambulatory Visit: Payer: Self-pay

## 2020-11-27 VITALS — BP 128/74 | HR 93 | Temp 98.6°F | Ht 64.0 in | Wt 149.0 lb

## 2020-11-27 DIAGNOSIS — Z0001 Encounter for general adult medical examination with abnormal findings: Secondary | ICD-10-CM

## 2020-11-27 DIAGNOSIS — R739 Hyperglycemia, unspecified: Secondary | ICD-10-CM | POA: Diagnosis not present

## 2020-11-27 DIAGNOSIS — Z23 Encounter for immunization: Secondary | ICD-10-CM | POA: Diagnosis not present

## 2020-11-27 DIAGNOSIS — E782 Mixed hyperlipidemia: Secondary | ICD-10-CM

## 2020-11-27 DIAGNOSIS — E559 Vitamin D deficiency, unspecified: Secondary | ICD-10-CM

## 2020-11-27 DIAGNOSIS — E538 Deficiency of other specified B group vitamins: Secondary | ICD-10-CM

## 2020-11-27 DIAGNOSIS — R21 Rash and other nonspecific skin eruption: Secondary | ICD-10-CM

## 2020-11-27 LAB — CBC WITH DIFFERENTIAL/PLATELET
Basophils Absolute: 0.1 10*3/uL (ref 0.0–0.1)
Basophils Relative: 0.6 % (ref 0.0–3.0)
Eosinophils Absolute: 0.1 10*3/uL (ref 0.0–0.7)
Eosinophils Relative: 1.2 % (ref 0.0–5.0)
HCT: 44.9 % (ref 39.0–52.0)
Hemoglobin: 15.3 g/dL (ref 13.0–17.0)
Lymphocytes Relative: 17.5 % (ref 12.0–46.0)
Lymphs Abs: 1.5 10*3/uL (ref 0.7–4.0)
MCHC: 34 g/dL (ref 30.0–36.0)
MCV: 87.7 fl (ref 78.0–100.0)
Monocytes Absolute: 0.7 10*3/uL (ref 0.1–1.0)
Monocytes Relative: 7.6 % (ref 3.0–12.0)
Neutro Abs: 6.4 10*3/uL (ref 1.4–7.7)
Neutrophils Relative %: 73.1 % (ref 43.0–77.0)
Platelets: 172 10*3/uL (ref 150.0–400.0)
RBC: 5.12 Mil/uL (ref 4.22–5.81)
RDW: 13.1 % (ref 11.5–15.5)
WBC: 8.7 10*3/uL (ref 4.0–10.5)

## 2020-11-27 LAB — LIPID PANEL
Cholesterol: 140 mg/dL (ref 0–200)
HDL: 42.9 mg/dL (ref >39.00)
LDL Cholesterol: 79 mg/dL (ref 0–99)
NonHDL: 97.19
Total CHOL/HDL Ratio: 3
Triglycerides: 90 mg/dL (ref 0.0–149.0)
VLDL: 18 mg/dL (ref 0.0–40.0)

## 2020-11-27 LAB — HEPATIC FUNCTION PANEL
ALT: 15 U/L (ref 0–53)
AST: 12 U/L (ref 0–37)
Albumin: 4.7 g/dL (ref 3.5–5.2)
Alkaline Phosphatase: 62 U/L (ref 39–117)
Bilirubin, Direct: 0.1 mg/dL (ref 0.0–0.3)
Total Bilirubin: 0.5 mg/dL (ref 0.2–1.2)
Total Protein: 6.6 g/dL (ref 6.0–8.3)

## 2020-11-27 LAB — VITAMIN D 25 HYDROXY (VIT D DEFICIENCY, FRACTURES): VITD: 23.35 ng/mL — ABNORMAL LOW (ref 30.00–100.00)

## 2020-11-27 LAB — BASIC METABOLIC PANEL
BUN: 23 mg/dL (ref 6–23)
CO2: 25 mEq/L (ref 19–32)
Calcium: 9.3 mg/dL (ref 8.4–10.5)
Chloride: 105 mEq/L (ref 96–112)
Creatinine, Ser: 0.85 mg/dL (ref 0.40–1.50)
GFR: 95.04 mL/min (ref >60.00)
Glucose, Bld: 93 mg/dL (ref 70–99)
Potassium: 4.2 mEq/L (ref 3.5–5.1)
Sodium: 139 mEq/L (ref 135–145)

## 2020-11-27 LAB — URINALYSIS, ROUTINE W REFLEX MICROSCOPIC
Bilirubin Urine: NEGATIVE
Hgb urine dipstick: NEGATIVE
Ketones, ur: NEGATIVE
Leukocytes,Ua: NEGATIVE
Nitrite: NEGATIVE
RBC / HPF: NONE SEEN (ref 0–?)
Specific Gravity, Urine: 1.02 (ref 1.000–1.030)
Total Protein, Urine: NEGATIVE
Urine Glucose: NEGATIVE
Urobilinogen, UA: 0.2 (ref 0.0–1.0)
WBC, UA: NONE SEEN (ref 0–?)
pH: 6.5 (ref 5.0–8.0)

## 2020-11-27 LAB — TSH: TSH: 4.31 u[IU]/mL (ref 0.35–4.50)

## 2020-11-27 LAB — VITAMIN B12: Vitamin B-12: 259 pg/mL (ref 211–911)

## 2020-11-27 LAB — PSA: PSA: 3.93 ng/mL (ref 0.10–4.00)

## 2020-11-27 LAB — HEMOGLOBIN A1C: Hgb A1c MFr Bld: 5.8 % (ref 4.6–6.5)

## 2020-11-27 MED ORDER — CLONAZEPAM 1 MG PO TABS: 1.0000 mg | ORAL_TABLET | Freq: Two times a day (BID) | ORAL | 2 refills | Status: DC | PRN

## 2020-11-27 MED ORDER — ATENOLOL 25 MG PO TABS: 25.0000 mg | ORAL_TABLET | Freq: Every day | ORAL | 3 refills | Status: DC

## 2020-11-27 MED ORDER — ATORVASTATIN CALCIUM 20 MG PO TABS: 20.0000 mg | ORAL_TABLET | Freq: Every day | ORAL | 3 refills | Status: DC

## 2020-11-27 MED ORDER — ALBUTEROL SULFATE HFA 108 (90 BASE) MCG/ACT IN AERS: 2.0000 | INHALATION_SPRAY | Freq: Four times a day (QID) | RESPIRATORY_TRACT | 11 refills | Status: DC | PRN

## 2020-11-27 MED ORDER — LEVOTHYROXINE SODIUM 100 MCG PO TABS: 100.0000 ug | ORAL_TABLET | Freq: Every day | ORAL | 3 refills | Status: DC

## 2020-11-27 MED ORDER — SERTRALINE HCL 100 MG PO TABS: 200.0000 mg | ORAL_TABLET | Freq: Every day | ORAL | 3 refills | Status: DC

## 2020-11-27 MED ORDER — TRIAMCINOLONE ACETONIDE 0.1 % EX CREA: 1.0000 "application " | TOPICAL_CREAM | Freq: Two times a day (BID) | CUTANEOUS | 1 refills | Status: AC

## 2020-11-27 NOTE — Assessment & Plan Note (Signed)
Mild eczematous type - for triam cr asd

## 2020-11-27 NOTE — Assessment & Plan Note (Addendum)
Age and sex appropriate education and counseling updated with regular exercise and diet Referrals for preventative services - none needed Immunizations addressed - declines covid booster, for shingrix #1 today Smoking counseling  - none needed Evidence for depression or other mood disorder - none significant Most recent labs reviewed. I have personally reviewed and have noted: 1) the patient's medical and social history 2) The patient's current medications and supplements 3) The patient's height, weight, and BMI have been recorded in the chart

## 2020-11-27 NOTE — Progress Notes (Signed)
Patient ID: Christopher Arias, male   DOB: June 09, 1960, 60 y.o.   MRN: [id number]         Chief Complaint:: wellness exam and rash, anxiety, hyperglycemia, hld,        HPI:  Christopher Arias is a 60 y.o. male here for wellness exam; plans to get covid booster soon. O/w up to date with preventive referrals and immunizations                        Also has new itchy rash to the distal anterior thigh, upper thighs, and lower back sort of scaly, nontender,, without worsening pain or fever, all mild for 2 wks, nothing seems to make better or worse..  Pt denies chest pain, increased sob or doe, wheezing, orthopnea, PND, increased LE swelling, palpitations, dizziness or syncope.   Pt denies polydipsia, polyuria, or new focal neuro s/s.   Pt denies fever, wt loss, night sweats, loss of appetite, or other constitutional symptoms  No other new complaints.  Denies worsening depressive symptoms, suicidal ideation, or panic; has ongoing anxiety, better with klonopin than xanax   Wt Readings from Last 3 Encounters:  11/27/20 149 lb (67.6 kg)  11/19/19 141 lb (64 kg)  11/12/19 156 lb 6.4 oz (70.9 kg)   BP Readings from Last 3 Encounters:  11/27/20 128/74  11/19/19 130/76  11/12/19 134/78   Immunization History  Administered Date(s) Administered   H1N1 07/19/2008   Influenza,inj,Quad PF,6+ Mos 03/12/2013, 06/17/2014, 03/08/2019   PFIZER(Purple Top)SARS-COV-2 Vaccination 08/30/2019, 09/20/2019   Td 07/19/2008   Tdap 09/13/2012   Zoster Recombinat (Shingrix) 11/27/2020   There are no preventive care reminders to display for this patient.     Past Medical History:  Diagnosis Date   ANTERIOR PITUITARY HYPERFUNCTION 11/23/2009   ANXIETY 11/24/2007   GERD 11/24/2007   GYNECOMASTIA 11/10/2009   HYPERLIPIDEMIA 11/24/2007   HYPOTHYROIDISM, POST-RADIATION 03/16/2010   Past Surgical History:  Procedure Laterality Date   INGUINAL HERNIA REPAIR      reports that he has been smoking cigarettes. He has been smoking  an average of 0.50 packs per day. He has never used smokeless tobacco. He reports current alcohol use. He reports that he does not use drugs. family history includes COPD in an other family member; Heart disease in his father and another family member; Stroke in an other family member. No Known Allergies Current Outpatient Medications on File Prior to Visit  Medication Sig Dispense Refill   aspirin 81 MG EC tablet Take 1 tablet (81 mg total) by mouth daily. Swallow whole. 30 tablet 12   triamcinolone (NASACORT) 55 MCG/ACT AERO nasal inhaler Place 2 sprays into the nose daily. 1 Inhaler 12   Fluticasone-Salmeterol (ADVAIR DISKUS) 250-50 MCG/DOSE AEPB Inhale 1 puff into the lungs 2 (two) times daily. 1 each 11   No current facility-administered medications on file prior to visit.        ROS:  All others reviewed and negative.  Objective        PE:  BP 128/74 (BP Location: Left Arm, Patient Position: Sitting, Cuff Size: Normal)   Pulse 93   Temp 98.6 F (37 C) (Oral)   Ht 5\' 4"  (1.626 m)   Wt 149 lb (67.6 kg)   SpO2 94%   BMI 25.58 kg/m                 Constitutional: Pt appears in NAD  HENT: Head: NCAT.                Right Ear: External ear normal.                 Left Ear: External ear normal.                Eyes: . Pupils are equal, round, and reactive to light. Conjunctivae and EOM are normal               Nose: without d/c or deformity               Neck: Neck supple. Gross normal ROM               Cardiovascular: Normal rate and regular rhythm.                 Pulmonary/Chest: Effort normal and breath sounds without rales or wheezing.                Abd:  Soft, NT, ND, + BS, no organomegaly               Neurological: Pt is alert. At baseline orientation, motor grossly intact               Skin:  LE edema - none, nontender erythem scaly itchy rash to distal anterior legs above the ankles.               Psychiatric: Pt behavior is normal without agitation    Micro: none  Cardiac tracings I have personally interpreted today:  none  Pertinent Radiological findings (summarize): none   Lab Results  Component Value Date   WBC 8.7 11/27/2020   HGB 15.3 11/27/2020   HCT 44.9 11/27/2020   PLT 172.0 11/27/2020   GLUCOSE 93 11/27/2020   CHOL 140 11/27/2020   TRIG 90.0 11/27/2020   HDL 42.90 11/27/2020   LDLCALC 79 11/27/2020   ALT 15 11/27/2020   AST 12 11/27/2020   NA 139 11/27/2020   K 4.2 11/27/2020   CL 105 11/27/2020   CREATININE 0.85 11/27/2020   BUN 23 11/27/2020   CO2 25 11/27/2020   TSH 4.31 11/27/2020   PSA 3.93 11/27/2020   HGBA1C 5.8 11/27/2020   Assessment/Plan:  Christopher Arias is a 60 y.o. White or Caucasian [1] male with  has a past medical history of ANTERIOR PITUITARY HYPERFUNCTION (11/23/2009), ANXIETY (11/24/2007), GERD (11/24/2007), GYNECOMASTIA (11/10/2009), HYPERLIPIDEMIA (11/24/2007), and HYPOTHYROIDISM, POST-RADIATION (03/16/2010).  Vitamin D deficiency Last vitamin D Lab Results  Component Value Date   VD25OH 22.80 (L) 03/08/2019   Low to start oral replacement   Encounter for well adult exam with abnormal findings Age and sex appropriate education and counseling updated with regular exercise and diet Referrals for preventative services - none needed Immunizations addressed - declines covid booster, for shingrix #1 today Smoking counseling  - none needed Evidence for depression or other mood disorder - none significant Most recent labs reviewed. I have personally reviewed and have noted: 1) the patient's medical and social history 2) The patient's current medications and supplements 3) The patient's height, weight, and BMI have been recorded in the chart   Hyperglycemia Lab Results  Component Value Date   HGBA1C 5.8 11/27/2020   Stable, pt to continue current medical treatment  - diet   Hyperlipidemia Lab Results  Component Value Date   LDLCALC 79 11/27/2020   Stable, pt to continue current  statin lipitor 20  as goal ldl < 100   Rash Mild eczematous type - for triam cr asd  Followup: No follow-ups on file.  Cathlean Cower, MD 11/27/2020 9:03 PM [facility]

## 2020-11-27 NOTE — Assessment & Plan Note (Addendum)
Lab Results  Component Value Date   LDLCALC 79 11/27/2020   Stable, pt to continue current statin lipitor 20 as goal ldl < 100

## 2020-11-27 NOTE — Assessment & Plan Note (Signed)
Last vitamin D Lab Results  Component Value Date   VD25OH 22.80 (L) 03/08/2019   Low to start oral replacement

## 2020-11-27 NOTE — Patient Instructions (Addendum)
Please take OTC Vitamin D3 at 2000 units per day, indefinitely  You had the shingles shot #1 today  Please return in 2 months to see the nurse only for shingles shot #2  Please take all new medication as prescribed - the cream for the rash  Ok to change back to klonopin  Please continue all other medications as before, and refills have been done if requested.  Please have the pharmacy call with any other refills you may need.  Please continue your efforts at being more active, low cholesterol diet, and weight control.  You are otherwise up to date with prevention measures today.  Please keep your appointments with your specialists as you may have planned  Please go to the LAB at the blood drawing area for the tests to be done  You will be contacted by phone if any changes need to be made immediately.  Otherwise, you will receive a letter about your results with an explanation, but please check with MyChart first.  Please remember to sign up for MyChart if you have not done so, as this will be important to you in the future with finding out test results, communicating by private email, and scheduling acute appointments online when needed.  Please make an Appointment to return for your 1 year visit, or sooner if needed, with Lab testing by Appointment as well, to be done about 3-5 days before at the [facility] (so this is for TWO appointments - please see the scheduling desk as you leave)  Due to the ongoing Covid 19 pandemic, our lab now requires an appointment for any labs done at our office.  If you need labs done and do not have an appointment, please call our office ahead of time to schedule before presenting to the lab for your testing.

## 2020-11-27 NOTE — Assessment & Plan Note (Signed)
Lab Results  Component Value Date   HGBA1C 5.8 11/27/2020   Stable, pt to continue current medical treatment  - diet

## 2021-01-31 ENCOUNTER — Ambulatory Visit (INDEPENDENT_AMBULATORY_CARE_PROVIDER_SITE_OTHER): Payer: BC Managed Care – PPO

## 2021-01-31 ENCOUNTER — Other Ambulatory Visit: Payer: Self-pay

## 2021-01-31 DIAGNOSIS — Z23 Encounter for immunization: Secondary | ICD-10-CM

## 2021-01-31 NOTE — Progress Notes (Signed)
Pt given 2nd Zoster vacc w/o any complications. 

## 2021-03-06 ENCOUNTER — Other Ambulatory Visit: Payer: Self-pay | Admitting: Internal Medicine

## 2021-03-12 DIAGNOSIS — H16142 Punctate keratitis, left eye: Secondary | ICD-10-CM | POA: Diagnosis not present

## 2021-05-30 ENCOUNTER — Encounter: Payer: Self-pay | Admitting: Internal Medicine

## 2021-05-30 ENCOUNTER — Other Ambulatory Visit: Payer: Self-pay

## 2021-05-30 ENCOUNTER — Ambulatory Visit: Payer: BC Managed Care – PPO | Admitting: Internal Medicine

## 2021-05-30 ENCOUNTER — Ambulatory Visit (INDEPENDENT_AMBULATORY_CARE_PROVIDER_SITE_OTHER): Payer: BC Managed Care – PPO

## 2021-05-30 VITALS — BP 126/70 | HR 87 | Temp 98.3°F | Ht 64.0 in | Wt 152.8 lb

## 2021-05-30 DIAGNOSIS — R058 Other specified cough: Secondary | ICD-10-CM | POA: Diagnosis not present

## 2021-05-30 DIAGNOSIS — E559 Vitamin D deficiency, unspecified: Secondary | ICD-10-CM | POA: Diagnosis not present

## 2021-05-30 DIAGNOSIS — J309 Allergic rhinitis, unspecified: Secondary | ICD-10-CM

## 2021-05-30 DIAGNOSIS — R739 Hyperglycemia, unspecified: Secondary | ICD-10-CM

## 2021-05-30 DIAGNOSIS — F419 Anxiety disorder, unspecified: Secondary | ICD-10-CM

## 2021-05-30 DIAGNOSIS — E782 Mixed hyperlipidemia: Secondary | ICD-10-CM

## 2021-05-30 DIAGNOSIS — F32A Depression, unspecified: Secondary | ICD-10-CM

## 2021-05-30 DIAGNOSIS — Z23 Encounter for immunization: Secondary | ICD-10-CM

## 2021-05-30 DIAGNOSIS — E538 Deficiency of other specified B group vitamins: Secondary | ICD-10-CM

## 2021-05-30 DIAGNOSIS — J439 Emphysema, unspecified: Secondary | ICD-10-CM | POA: Diagnosis not present

## 2021-05-30 DIAGNOSIS — E039 Hypothyroidism, unspecified: Secondary | ICD-10-CM

## 2021-05-30 DIAGNOSIS — F5101 Primary insomnia: Secondary | ICD-10-CM | POA: Diagnosis not present

## 2021-05-30 DIAGNOSIS — R5383 Other fatigue: Secondary | ICD-10-CM | POA: Diagnosis not present

## 2021-05-30 DIAGNOSIS — G4710 Hypersomnia, unspecified: Secondary | ICD-10-CM

## 2021-05-30 DIAGNOSIS — R059 Cough, unspecified: Secondary | ICD-10-CM | POA: Diagnosis not present

## 2021-05-30 DIAGNOSIS — F17200 Nicotine dependence, unspecified, uncomplicated: Secondary | ICD-10-CM

## 2021-05-30 DIAGNOSIS — R413 Other amnesia: Secondary | ICD-10-CM

## 2021-05-30 DIAGNOSIS — R069 Unspecified abnormalities of breathing: Secondary | ICD-10-CM

## 2021-05-30 MED ORDER — TRAZODONE HCL 50 MG PO TABS: 50.0000 mg | ORAL_TABLET | Freq: Every evening | ORAL | 1 refills | Status: DC | PRN

## 2021-05-30 MED ORDER — TRIAMCINOLONE ACETONIDE 55 MCG/ACT NA AERO: 2.0000 | INHALATION_SPRAY | Freq: Every day | NASAL | 12 refills | Status: DC

## 2021-05-30 MED ORDER — ATORVASTATIN CALCIUM 20 MG PO TABS: 20.0000 mg | ORAL_TABLET | Freq: Every day | ORAL | 3 refills | Status: DC

## 2021-05-30 MED ORDER — FLUTICASONE-SALMETEROL 250-50 MCG/ACT IN AEPB
1.0000 | INHALATION_SPRAY | Freq: Two times a day (BID) | RESPIRATORY_TRACT | 3 refills | Status: DC
Start: 2021-05-30 — End: 2022-05-07

## 2021-05-30 MED ORDER — SERTRALINE HCL 100 MG PO TABS: 200.0000 mg | ORAL_TABLET | Freq: Every day | ORAL | 3 refills | Status: DC

## 2021-05-30 MED ORDER — LEVOTHYROXINE SODIUM 100 MCG PO TABS: 100.0000 ug | ORAL_TABLET | Freq: Every day | ORAL | 3 refills | Status: DC

## 2021-05-30 MED ORDER — ALBUTEROL SULFATE HFA 108 (90 BASE) MCG/ACT IN AERS: 2.0000 | INHALATION_SPRAY | Freq: Four times a day (QID) | RESPIRATORY_TRACT | 11 refills | Status: DC | PRN

## 2021-05-30 MED ORDER — CLONAZEPAM 1 MG PO TABS
ORAL_TABLET | ORAL | 2 refills | Status: DC
Start: 2021-05-30 — End: 2021-09-18

## 2021-05-30 NOTE — Progress Notes (Signed)
Patient ID: Christopher Arias, male   DOB: 02-06-1961, 60 y.o.   MRN: [id number]        Chief Complaint: follow up fatigue,wt gain, anxiety/depression, allergies, insomnia, abnormal night time breathing, and memory change       HPI:  Christopher Arias is a 60 y.o. male here with [name] who with her daughter lives in the same home as patient and his wife and relates most of the hx the patient confims; pt with marked worsening social stressors at home and work with increased anxiety and depressive symptoms, fatigue, wt gain, and memory difficulty such as losing his car in the parking lot, couldn't follow cookbook instructions, not keeping track of work schedule well.  [name] relates pt has marked apneic spells with sleeping and snoring, pt denies non restorative sleep, but Does have several wks ongoing nasal allergy symptoms with clearish congestion, itch and sneezing, without fever, pain, ST, swelling or wheezing .  Also has chronic ongoing non prod cough but not clear if related.  Also with marked increase difficulty getting to sleep most night as brain just wont turn off.  Denies hyper or hypo thyroid symptoms such as voice, skin or hair change.  Denies worsening reflux, abd pain, dysphagia, n/v, bowel change or blood.     Wt Readings from Last 3 Encounters:  05/30/21 152 lb 12.8 oz (69.3 kg)  11/27/20 149 lb (67.6 kg)  11/19/19 141 lb (64 kg)   BP Readings from Last 3 Encounters:  05/30/21 126/70  11/27/20 128/74  11/19/19 130/76         Past Medical History:  Diagnosis Date   ANTERIOR PITUITARY HYPERFUNCTION 11/23/2009   ANXIETY 11/24/2007   GERD 11/24/2007   GYNECOMASTIA 11/10/2009   HYPERLIPIDEMIA 11/24/2007   HYPOTHYROIDISM, POST-RADIATION 03/16/2010   Past Surgical History:  Procedure Laterality Date   INGUINAL HERNIA REPAIR      reports that he has been smoking cigarettes. He has been smoking an average of .5 packs per day. He has never used smokeless tobacco. He reports current alcohol  use. He reports that he does not use drugs. family history includes COPD in an other family member; Heart disease in his father and another family member; Stroke in an other family member. Allergies  Allergen Reactions   Atenolol Other (See Comments)    Nose bleeds   Current Outpatient Medications on File Prior to Visit  Medication Sig Dispense Refill   aspirin 81 MG EC tablet Take 1 tablet (81 mg total) by mouth daily. Swallow whole. 30 tablet 12   triamcinolone cream (KENALOG) 0.1 % Apply 1 application topically 2 (two) times daily. 30 g 1   No current facility-administered medications on file prior to visit.        ROS:  All others reviewed and negative.  Objective        PE:  BP 126/70 (BP Location: Right Arm, Patient Position: Sitting, Cuff Size: Large)    Pulse 87    Temp 98.3 F (36.8 C) (Oral)    Ht 5\' 4"  (1.626 m)    Wt 152 lb 12.8 oz (69.3 kg)    SpO2 94%    BMI 26.23 kg/m                 Constitutional: Pt appears in NAD               HENT: Head: NCAT.  Right Ear: External ear normal.                 Left Ear: External ear normal. Bilat tm's with mild erythema.  Max sinus areas non tender.  Pharynx with mild erythema, no exudate               Eyes: . Pupils are equal, round, and reactive to light. Conjunctivae and EOM are normal               Nose: without d/c or deformity               Neck: Neck supple. Gross normal ROM               Cardiovascular: Normal rate and regular rhythm.                 Pulmonary/Chest: Effort normal and breath sounds without rales or wheezing.                Abd:  Soft, NT, ND, + BS, no organomegaly               Neurological: Pt is alert. At baseline orientation, motor grossly intact               Skin: Skin is warm. No rashes, no other new lesions, LE edema - none               Psychiatric: Pt behavior is normal without agitation , anxious depressed  Micro: none  Cardiac tracings I have personally interpreted today:   none  Pertinent Radiological findings (summarize): none   Lab Results  Component Value Date   WBC 8.7 11/27/2020   HGB 15.3 11/27/2020   HCT 44.9 11/27/2020   PLT 172.0 11/27/2020   GLUCOSE 93 11/27/2020   CHOL 140 11/27/2020   TRIG 90.0 11/27/2020   HDL 42.90 11/27/2020   LDLCALC 79 11/27/2020   ALT 15 11/27/2020   AST 12 11/27/2020   NA 139 11/27/2020   K 4.2 11/27/2020   CL 105 11/27/2020   CREATININE 0.85 11/27/2020   BUN 23 11/27/2020   CO2 25 11/27/2020   TSH 4.31 11/27/2020   PSA 3.93 11/27/2020   HGBA1C 5.8 11/27/2020   Assessment/Plan:  Christopher Arias is a 60 y.o. White or Caucasian [1] male with  has a past medical history of ANTERIOR PITUITARY HYPERFUNCTION (11/23/2009), ANXIETY (11/24/2007), GERD (11/24/2007), GYNECOMASTIA (11/10/2009), HYPERLIPIDEMIA (11/24/2007), and HYPOTHYROIDISM, POST-RADIATION (03/16/2010).  Hypothyroidism Lab Results  Component Value Date   TSH 4.31 11/27/2020   Stable, pt to continue levothyroxine, for f/u lab   Hyperlipidemia Lab Results  Component Value Date   LDLCALC 79 11/27/2020   Stable, pt to continue current statin lipitor   Anxiety and depression With mild to mod worsening, no SI or HI, but contd zoloft, refer counseling as well as psychiatry  Smoker Counseled to quit, pt not ready  Insomnia Mild to mod worsening, for trazodone qhs prn  Hyperglycemia Lab Results  Component Value Date   HGBA1C 5.8 11/27/2020   Stable, pt to continue current medical treatment  - diet   Allergic rhinitis Mild to mod ongoing, for restart nasacort,  to f/u any worsening symptoms or concerns  Vitamin D deficiency Last vitamin D Lab Results  Component Value Date   VD25OH 23.35 (L) 11/27/2020   Low, reminded to start oral replacement   Fatigue Etiology unclear, Exam otherwise benign, to check labs as documented, follow with expectant  management\  Cough Etiology unclear except possible allergy related; for cxr  Memory  change I suspect psychiatric related, but cant r/o other  - for tx above, but consider MRI and neurology if persists or worsens  Abnormal breathing Reported as apneic spells per family - cant r/o osa related symptoms as above - for pulmonary referral  Followup: No follow-ups on file.  Cathlean Cower, MD 05/30/2021 8:28 PM [facility]

## 2021-05-30 NOTE — Assessment & Plan Note (Signed)
Counseled to quit, pt not ready 

## 2021-05-30 NOTE — Assessment & Plan Note (Signed)
Reported as apneic spells per family - cant r/o osa related symptoms as above - for pulmonary referral

## 2021-05-30 NOTE — Assessment & Plan Note (Signed)
Etiology unclear except possible allergy related; for cxr

## 2021-05-30 NOTE — Assessment & Plan Note (Signed)
With mild to mod worsening, no SI or HI, but contd zoloft, refer counseling as well as psychiatry

## 2021-05-30 NOTE — Assessment & Plan Note (Addendum)
Lab Results  Component Value Date   TSH 4.31 11/27/2020   Stable, pt to continue levothyroxine, for f/u lab

## 2021-05-30 NOTE — Assessment & Plan Note (Signed)
Lab Results  Component Value Date   HGBA1C 5.8 11/27/2020   Stable, pt to continue current medical treatment  - diet

## 2021-05-30 NOTE — Patient Instructions (Signed)
You had the flu shot today  Please take all new medication as prescribed - the trazodone for sleep  Please continue all other medications as before, and refills have been done if requested.  Please have the pharmacy call with any other refills you may need.  Please continue your efforts at being more active, low cholesterol diet, and weight control.  You are otherwise up to date with prevention measures today.  Please keep your appointments with your specialists as you may have planned  You will be contacted regarding the referral for: counseling and psychiatry, and pulmonary  Please go to the XRAY Department in the first floor for the x-ray testing  Please go to the LAB at the blood drawing area for the tests to be done  You will be contacted by phone if any changes need to be made immediately.  Otherwise, you will receive a letter about your results with an explanation, but please check with MyChart first.  Please remember to sign up for MyChart if you have not done so, as this will be important to you in the future with finding out test results, communicating by private email, and scheduling acute appointments online when needed.  Please make an Appointment to return in 6 months, or sooner if needed  If the memory issue persists or gets worse, we may need an MRI or Neurology referral

## 2021-05-30 NOTE — Assessment & Plan Note (Signed)
Mild to mod worsening, for trazodone qhs prn

## 2021-05-30 NOTE — Assessment & Plan Note (Signed)
Etiology unclear, Exam otherwise benign, to check labs as documented, follow with expectant management  

## 2021-05-30 NOTE — Assessment & Plan Note (Signed)
I suspect psychiatric related, but cant r/o other  - for tx above, but consider MRI and neurology if persists or worsens

## 2021-05-30 NOTE — Assessment & Plan Note (Signed)
Mild to mod ongoing, for restart nasacort,  to f/u any worsening symptoms or concerns

## 2021-05-30 NOTE — Assessment & Plan Note (Signed)
Last vitamin D Lab Results  Component Value Date   VD25OH 23.35 (L) 11/27/2020   Low, reminded to start oral replacement

## 2021-05-30 NOTE — Assessment & Plan Note (Signed)
Lab Results  Component Value Date   LDLCALC 79 11/27/2020   Stable, pt to continue current statin lipitor

## 2021-05-31 ENCOUNTER — Encounter: Payer: Self-pay | Admitting: Internal Medicine

## 2021-05-31 DIAGNOSIS — J449 Chronic obstructive pulmonary disease, unspecified: Secondary | ICD-10-CM | POA: Insufficient documentation

## 2021-05-31 HISTORY — DX: Chronic obstructive pulmonary disease, unspecified: J44.9

## 2021-05-31 LAB — BASIC METABOLIC PANEL
BUN: 18 mg/dL (ref 6–23)
CO2: 28 mEq/L (ref 19–32)
Calcium: 9.4 mg/dL (ref 8.4–10.5)
Chloride: 105 mEq/L (ref 96–112)
Creatinine, Ser: 0.9 mg/dL (ref 0.40–1.50)
GFR: 93.08 mL/min (ref >60.00)
Glucose, Bld: 99 mg/dL (ref 70–99)
Potassium: 4.5 mEq/L (ref 3.5–5.1)
Sodium: 140 mEq/L (ref 135–145)

## 2021-05-31 LAB — URINALYSIS, ROUTINE W REFLEX MICROSCOPIC
Bilirubin Urine: NEGATIVE
Hgb urine dipstick: NEGATIVE
Ketones, ur: NEGATIVE
Leukocytes,Ua: NEGATIVE
Nitrite: NEGATIVE
RBC / HPF: NONE SEEN (ref 0–?)
Specific Gravity, Urine: 1.025 (ref 1.000–1.030)
Total Protein, Urine: NEGATIVE
Urine Glucose: NEGATIVE
Urobilinogen, UA: 0.2 (ref 0.0–1.0)
WBC, UA: NONE SEEN (ref 0–?)
pH: 6.5 (ref 5.0–8.0)

## 2021-05-31 LAB — HEPATIC FUNCTION PANEL
ALT: 14 U/L (ref 0–53)
AST: 14 U/L (ref 0–37)
Albumin: 4.6 g/dL (ref 3.5–5.2)
Alkaline Phosphatase: 64 U/L (ref 39–117)
Bilirubin, Direct: 0.1 mg/dL (ref 0.0–0.3)
Total Bilirubin: 0.6 mg/dL (ref 0.2–1.2)
Total Protein: 6.7 g/dL (ref 6.0–8.3)

## 2021-05-31 LAB — LIPID PANEL
Cholesterol: 162 mg/dL (ref 0–200)
HDL: 44.8 mg/dL (ref >39.00)
LDL Cholesterol: 103 mg/dL — ABNORMAL HIGH (ref 0–99)
NonHDL: 116.9
Total CHOL/HDL Ratio: 4
Triglycerides: 72 mg/dL (ref 0.0–149.0)
VLDL: 14.4 mg/dL (ref 0.0–40.0)

## 2021-05-31 LAB — CBC WITH DIFFERENTIAL/PLATELET
Basophils Absolute: 0 10*3/uL (ref 0.0–0.1)
Basophils Relative: 0.4 % (ref 0.0–3.0)
Eosinophils Absolute: 0.1 10*3/uL (ref 0.0–0.7)
Eosinophils Relative: 0.7 % (ref 0.0–5.0)
HCT: 46.3 % (ref 39.0–52.0)
Hemoglobin: 15.5 g/dL (ref 13.0–17.0)
Lymphocytes Relative: 8.4 % — ABNORMAL LOW (ref 12.0–46.0)
Lymphs Abs: 0.7 10*3/uL (ref 0.7–4.0)
MCHC: 33.5 g/dL (ref 30.0–36.0)
MCV: 89.1 fl (ref 78.0–100.0)
Monocytes Absolute: 0.7 10*3/uL (ref 0.1–1.0)
Monocytes Relative: 7.7 % (ref 3.0–12.0)
Neutro Abs: 7.2 10*3/uL (ref 1.4–7.7)
Neutrophils Relative %: 82.8 % — ABNORMAL HIGH (ref 43.0–77.0)
Platelets: 170 10*3/uL (ref 150.0–400.0)
RBC: 5.19 Mil/uL (ref 4.22–5.81)
RDW: 14 % (ref 11.5–15.5)
WBC: 8.7 10*3/uL (ref 4.0–10.5)

## 2021-05-31 LAB — VITAMIN B12: Vitamin B-12: 290 pg/mL (ref 211–911)

## 2021-05-31 LAB — HEMOGLOBIN A1C: Hgb A1c MFr Bld: 5.7 % (ref 4.6–6.5)

## 2021-05-31 LAB — VITAMIN D 25 HYDROXY (VIT D DEFICIENCY, FRACTURES): VITD: 17.49 ng/mL — ABNORMAL LOW (ref 30.00–100.00)

## 2021-05-31 LAB — TSH: TSH: 2.8 u[IU]/mL (ref 0.35–5.50)

## 2021-07-04 ENCOUNTER — Ambulatory Visit (HOSPITAL_COMMUNITY): Payer: Self-pay | Admitting: Clinical

## 2021-08-22 ENCOUNTER — Other Ambulatory Visit: Payer: Self-pay

## 2021-08-22 ENCOUNTER — Encounter: Payer: Self-pay | Admitting: Pulmonary Disease

## 2021-08-22 ENCOUNTER — Ambulatory Visit (INDEPENDENT_AMBULATORY_CARE_PROVIDER_SITE_OTHER): Payer: BC Managed Care – PPO | Admitting: Pulmonary Disease

## 2021-08-22 VITALS — BP 112/60 | HR 93 | Temp 98.0°F | Ht 65.0 in | Wt 150.0 lb

## 2021-08-22 DIAGNOSIS — R0683 Snoring: Secondary | ICD-10-CM

## 2021-08-22 DIAGNOSIS — Z720 Tobacco use: Secondary | ICD-10-CM

## 2021-08-22 MED ORDER — VARENICLINE TARTRATE 0.5 MG X 11 & 1 MG X 42 PO TBPK: ORAL_TABLET | ORAL | 0 refills | Status: DC

## 2021-08-22 NOTE — Patient Instructions (Addendum)
Chantix (varenicline) 0.5 mg daily for 3 days, then 0.5 mg twice daily for 7 days.  Set your quit date to stop smoking after you have used chantix for 1 week, and then start using chantix 1 mg twice per day. ? ?You can use nicotine gum as needed while using chantix. ? ?Will arrange for home sleep study ? ?Will call to arrange for follow up after sleep study reviewed ? ? ? ? ?

## 2021-08-22 NOTE — Progress Notes (Signed)
? ?[facility] ? ?Chief Complaint  ?Patient presents with  ? Consult  ?  Tired all the time.   Difficulty falling asleep.  Takes sleeping medicine to help him sleep (OTC).    Wife says he snores, loudly at times.  Also reports apnea.  ? ? ?Past Surgical History:  ?He  has a past surgical history that includes Inguinal hernia repair. ? ?Past Medical History:  ?Anxiety, GERD, HLD, Hypothyroidism ? ?Constitutional:  ?BP 112/60 (BP Location: Right Arm, Patient Position: Sitting, Cuff Size: Normal)   Pulse 93   Temp 98 ?F (36.7 ?C) (Oral)   Ht '5\' 5"'$  (1.651 m)   Wt 150 lb (68 kg)   SpO2 96%   BMI 24.96 kg/m?  ? ?Brief Summary:  ?Christopher Arias is a 61 y.o. male smoker with snoring. ?  ? ? ? ?Subjective:  ? ?His wife was available by phone during this visit. ? ?His wife has been worried about his snoring.  This has been present for years.  He stops breathing while asleep, especially on his right side.  He will talk in his sleep and sometimes get up in the middle of the night without realizing this.  He is feeling more tired during the day.  He has lots of stress at home since his wife had her health issues. ? ?He goes to sleep at 9 pm.  He falls asleep after awhile.  He sleeps through the night.  He gets out of bed at 415 am to go to work.  He feels tired in the morning.  He denies morning headache.  He uses OTC medications nightly to help sleep.  He drinks two cups of coffee in the morning. ? ?He denies sleep walking, bruxism, or nightmares.  There is no history of restless legs.  He denies sleep hallucinations, sleep paralysis, or cataplexy. ? ?The Epworth score is 0 out of 24. ? ?His chest xray from 05/31/21 showed changes of emphysema.  He smokes 1/2 ppd.  He has cough in the morning.  He doesn't feel his breathing limits his activity level. ? ?Physical Exam:  ? ?Appearance - well kempt  ? ?ENMT - no sinus tenderness, no oral exudate, no LAN, Mallampati 4 airway, no  stridor ? ?Respiratory - equal breath sounds bilaterally, no wheezing or rales ? ?CV - s1s2 regular rate and rhythm, no murmurs ? ?Ext - no clubbing, no edema ? ?Skin - no rashes ? ?Psych - normal mood and affect ?  ?Sleep Tests:  ? ? ?Social History:  ?He  reports that he has been smoking cigarettes. He has been smoking an average of 1 pack per day. He has never used smokeless tobacco. He reports current alcohol use. He reports that he does not use drugs. ? ?Family History:  ?His family history includes COPD in an other family member; Heart disease in his father and another family member; Stroke in an other family member. ?  ? ?Discussion:  ?He has snoring, sleep disruption, apnea and daytime sleepiness.  He has history of anxiety and depression.  I am concerned he could have sleep apnea. ? ?Assessment/Plan:  ? ?Snoring with excessive daytime sleepiness. ?- will need to arrange for a home sleep study ? ?Tobacco abuse. ?- he is agreeable to try chantix; discussed dosing schedule, setting quit date and side effects to monitor for ?- advised he can use nicotine replacement while using chantix ? ?Reported history of COPD. ?- he would  like to defer additional testing for now and see how he does with smoking cessation efforts ? ?Obesity. ?- discussed how weight can impact sleep and risk for sleep disordered breathing ?- discussed options to assist with weight loss: combination of diet modification, cardiovascular and strength training exercises ? ?Cardiovascular risk. ?- had an extensive discussion regarding the adverse health consequences related to untreated sleep disordered breathing ?- specifically discussed the risks for hypertension, coronary artery disease, cardiac dysrhythmias, cerebrovascular disease, and diabetes ?- lifestyle modification discussed ? ?Safe driving practices. ?- discussed how sleep disruption can increase risk of accidents, particularly when driving ?- safe driving practices were  discussed ? ?Therapies for obstructive sleep apnea. ?- if the sleep study shows significant sleep apnea, then various therapies for treatment were reviewed: CPAP, oral appliance, and surgical interventions ? ?Time Spent Involved in Patient Care on Day of Examination:  ?47 minutes ? ?Follow up:  ? ?Patient Instructions  ?Chantix (varenicline) 0.5 mg daily for 3 days, then 0.5 mg twice daily for 7 days.  Set your quit date to stop smoking after you have used chantix for 1 week, and then start using chantix 1 mg twice per day. ? ?You can use nicotine gum as needed while using chantix. ? ?Will arrange for home sleep study ? ?Will call to arrange for follow up after sleep study reviewed ? ? ? ? ? ?Medication List:  ? ?Allergies as of 08/22/2021   ? ?   Reactions  ? Atenolol Other (See Comments)  ? Nose bleeds  ? ?  ? ?  ?Medication List  ?  ? ?  ? Accurate as of August 22, 2021  4:37 PM. If you have any questions, ask your nurse or doctor.  ?  ?  ? ?  ? ?STOP taking these medications   ? ?traZODone 50 MG tablet ?Commonly known as: DESYREL ?Stopped by: Chesley Mires, MD ?  ? ?  ? ?TAKE these medications   ? ?albuterol 108 (90 Base) MCG/ACT inhaler ?Commonly known as: VENTOLIN HFA ?Inhale 2 puffs into the lungs every 6 (six) hours as needed for wheezing or shortness of breath. ?  ?aspirin 81 MG EC tablet ?Take 1 tablet (81 mg total) by mouth daily. Swallow whole. ?  ?atorvastatin 20 MG tablet ?Commonly known as: LIPITOR ?Take 1 tablet (20 mg total) by mouth daily. ?  ?clonazePAM 1 MG tablet ?Commonly known as: KLONOPIN ?TAKE 1 TABLET BY MOUTH 2 TIMES DAILY AS NEEDED. ?  ?fluticasone-salmeterol 250-50 MCG/ACT Aepb ?Commonly known as: ADVAIR ?Inhale 1 puff into the lungs in the morning and at bedtime. ?  ?levothyroxine 100 MCG tablet ?Commonly known as: SYNTHROID ?Take 1 tablet (100 mcg total) by mouth daily. ?  ?sertraline 100 MG tablet ?Commonly known as: ZOLOFT ?Take 2 tablets (200 mg total) by mouth daily. ?  ?triamcinolone  55 MCG/ACT Aero nasal inhaler ?Commonly known as: NASACORT ?Place 2 sprays into the nose daily. ?  ?triamcinolone cream 0.1 % ?Commonly known as: KENALOG ?Apply 1 application topically 2 (two) times daily. ?  ?varenicline 0.5 MG X 11 & 1 MG X 42 tablet ?Commonly known as: CHANTIX PAK ?Take one 0.5 mg tablet by mouth once daily for 3 days, then increase to one 0.5 mg tablet twice daily for 4 days, then increase to one 1 mg tablet twice daily. ?Started by: Chesley Mires, MD ?  ? ?  ? ? ?Signature:  ?Chesley Mires, MD ?[facility] ?Pager - [phone] ?  08/22/2021, 4:37 PM ?  ? ? ? ? ? ? ? ? ?

## 2021-09-13 ENCOUNTER — Telehealth: Payer: Self-pay | Admitting: Pulmonary Disease

## 2021-09-13 NOTE — Telephone Encounter (Signed)
I called patient back and he states that he needs a refill of his Chantix medication. I told patient that I would message Dr Halford Chessman to make sure that we refill the correct dosage and duration of medication and that as soon as Dr Halford Chessman responds we would give him a call back to inform him that we are refilling the medication or not.  ? ?Dr Halford Chessman please advise on dosage and for the duration.  ?

## 2021-09-17 MED ORDER — VARENICLINE TARTRATE 1 MG PO TABS: 1.0000 mg | ORAL_TABLET | Freq: Two times a day (BID) | ORAL | 2 refills | Status: DC

## 2021-09-17 NOTE — Telephone Encounter (Signed)
Chantix 1 mg bid, #60 with 2 refills. ?

## 2021-09-17 NOTE — Telephone Encounter (Signed)
Called pt and there was no answer- LMTCB. Will need to verify pharm.  ?

## 2021-09-17 NOTE — Telephone Encounter (Signed)
Called and spoke with patient's wife to let  know that RX was being sent to preferred pharmacy. She expressed understanding. Nothing further needed at this time.  ?

## 2021-09-18 ENCOUNTER — Other Ambulatory Visit: Payer: Self-pay | Admitting: Internal Medicine

## 2021-11-09 ENCOUNTER — Ambulatory Visit: Payer: BC Managed Care – PPO

## 2021-11-09 DIAGNOSIS — G4733 Obstructive sleep apnea (adult) (pediatric): Secondary | ICD-10-CM | POA: Diagnosis not present

## 2021-11-09 DIAGNOSIS — R0683 Snoring: Secondary | ICD-10-CM

## 2021-11-12 ENCOUNTER — Telehealth: Payer: Self-pay | Admitting: Pulmonary Disease

## 2021-11-12 DIAGNOSIS — G4733 Obstructive sleep apnea (adult) (pediatric): Secondary | ICD-10-CM | POA: Diagnosis not present

## 2021-11-12 NOTE — Telephone Encounter (Signed)
HST 11/09/21 >> AHI 15.6, SpO2 low 87%   Please inform him that his sleep study shows moderate obstructive sleep apnea.  Please arrange for ROV with me or NP to discuss treatment options.

## 2021-11-13 NOTE — Telephone Encounter (Signed)
Called and went over results with patient and he voiced understanding. Scheduled patient an appt with [facility] NP in [place] on 6/26. Patient wanted Dr. Halford Chessman he has not had a cigarette in almost 3 months since seeing him. Will route to Dr. Halford Chessman as an Juluis Rainier. Appt reminder letter mailed to patient. Nothing further needed.

## 2021-11-26 ENCOUNTER — Ambulatory Visit: Payer: BC Managed Care – PPO | Admitting: Nurse Practitioner

## 2021-11-26 ENCOUNTER — Encounter: Payer: Self-pay | Admitting: Nurse Practitioner

## 2021-11-26 VITALS — BP 136/60 | HR 107 | Temp 98.3°F | Ht 65.0 in | Wt 162.8 lb

## 2021-11-26 DIAGNOSIS — Z720 Tobacco use: Secondary | ICD-10-CM | POA: Diagnosis not present

## 2021-11-26 DIAGNOSIS — G4733 Obstructive sleep apnea (adult) (pediatric): Secondary | ICD-10-CM

## 2021-11-26 DIAGNOSIS — Z87891 Personal history of nicotine dependence: Secondary | ICD-10-CM | POA: Diagnosis not present

## 2021-11-26 NOTE — Assessment & Plan Note (Signed)
Quit 3 months ago. Doing great so far. Discussed lung cancer screening program - referral placed today.

## 2021-12-26 ENCOUNTER — Other Ambulatory Visit: Payer: Self-pay | Admitting: Pulmonary Disease

## 2021-12-28 ENCOUNTER — Other Ambulatory Visit: Payer: Self-pay | Admitting: Pulmonary Disease

## 2021-12-28 ENCOUNTER — Telehealth: Payer: Self-pay | Admitting: Pulmonary Disease

## 2021-12-28 MED ORDER — VARENICLINE TARTRATE 1 MG PO TABS: 1.0000 mg | ORAL_TABLET | Freq: Two times a day (BID) | ORAL | 2 refills | Status: DC

## 2021-12-28 NOTE — Telephone Encounter (Signed)
Called and spoke to wife and went over medication that patient needed refilled. Went over pharmacy the wife wanted it sent to. Nothing further needed

## 2022-01-09 ENCOUNTER — Telehealth: Payer: Self-pay | Admitting: Internal Medicine

## 2022-01-09 ENCOUNTER — Other Ambulatory Visit: Payer: Self-pay | Admitting: Internal Medicine

## 2022-01-09 MED ORDER — CLONAZEPAM 1 MG PO TABS: 1.0000 mg | ORAL_TABLET | Freq: Two times a day (BID) | ORAL | 2 refills | Status: DC | PRN

## 2022-01-09 NOTE — Telephone Encounter (Signed)
Clonazapam that was sent to [facility] on [address] - they do not have the medication.  Please resend to [facility] [address], [address]

## 2022-01-09 NOTE — Telephone Encounter (Signed)
Ok done

## 2022-01-30 DIAGNOSIS — M9903 Segmental and somatic dysfunction of lumbar region: Secondary | ICD-10-CM | POA: Diagnosis not present

## 2022-01-30 DIAGNOSIS — M9901 Segmental and somatic dysfunction of cervical region: Secondary | ICD-10-CM | POA: Diagnosis not present

## 2022-01-30 DIAGNOSIS — M9902 Segmental and somatic dysfunction of thoracic region: Secondary | ICD-10-CM | POA: Diagnosis not present

## 2022-01-30 DIAGNOSIS — M546 Pain in thoracic spine: Secondary | ICD-10-CM | POA: Diagnosis not present

## 2022-02-06 DIAGNOSIS — M9902 Segmental and somatic dysfunction of thoracic region: Secondary | ICD-10-CM | POA: Diagnosis not present

## 2022-02-06 DIAGNOSIS — M9901 Segmental and somatic dysfunction of cervical region: Secondary | ICD-10-CM | POA: Diagnosis not present

## 2022-02-06 DIAGNOSIS — M9905 Segmental and somatic dysfunction of pelvic region: Secondary | ICD-10-CM | POA: Diagnosis not present

## 2022-02-08 DIAGNOSIS — M9902 Segmental and somatic dysfunction of thoracic region: Secondary | ICD-10-CM | POA: Diagnosis not present

## 2022-02-08 DIAGNOSIS — M9905 Segmental and somatic dysfunction of pelvic region: Secondary | ICD-10-CM | POA: Diagnosis not present

## 2022-02-08 DIAGNOSIS — M9901 Segmental and somatic dysfunction of cervical region: Secondary | ICD-10-CM | POA: Diagnosis not present

## 2022-02-11 DIAGNOSIS — M9902 Segmental and somatic dysfunction of thoracic region: Secondary | ICD-10-CM | POA: Diagnosis not present

## 2022-02-11 DIAGNOSIS — M9901 Segmental and somatic dysfunction of cervical region: Secondary | ICD-10-CM | POA: Diagnosis not present

## 2022-02-11 DIAGNOSIS — M9905 Segmental and somatic dysfunction of pelvic region: Secondary | ICD-10-CM | POA: Diagnosis not present

## 2022-02-15 DIAGNOSIS — M9901 Segmental and somatic dysfunction of cervical region: Secondary | ICD-10-CM | POA: Diagnosis not present

## 2022-02-15 DIAGNOSIS — M9905 Segmental and somatic dysfunction of pelvic region: Secondary | ICD-10-CM | POA: Diagnosis not present

## 2022-02-15 DIAGNOSIS — M9902 Segmental and somatic dysfunction of thoracic region: Secondary | ICD-10-CM | POA: Diagnosis not present

## 2022-05-05 ENCOUNTER — Ambulatory Visit (HOSPITAL_COMMUNITY)
Admission: RE | Admit: 2022-05-05 | Discharge: 2022-05-05 | Disposition: A | Discharge status: Home / Self Care | Payer: BC Managed Care – PPO | Source: Ambulatory Visit | Attending: Emergency Medicine | Admitting: Emergency Medicine

## 2022-05-05 ENCOUNTER — Encounter (HOSPITAL_COMMUNITY): Payer: Self-pay

## 2022-05-05 ENCOUNTER — Ambulatory Visit (INDEPENDENT_AMBULATORY_CARE_PROVIDER_SITE_OTHER): Payer: BC Managed Care – PPO

## 2022-05-05 ENCOUNTER — Other Ambulatory Visit: Payer: Self-pay

## 2022-05-05 VITALS — BP 116/69 | HR 97 | Temp 98.5°F | Resp 28

## 2022-05-05 DIAGNOSIS — J441 Chronic obstructive pulmonary disease with (acute) exacerbation: Secondary | ICD-10-CM

## 2022-05-05 DIAGNOSIS — B349 Viral infection, unspecified: Secondary | ICD-10-CM

## 2022-05-05 DIAGNOSIS — R0602 Shortness of breath: Secondary | ICD-10-CM | POA: Diagnosis not present

## 2022-05-05 DIAGNOSIS — J449 Chronic obstructive pulmonary disease, unspecified: Secondary | ICD-10-CM | POA: Diagnosis not present

## 2022-05-05 DIAGNOSIS — R051 Acute cough: Secondary | ICD-10-CM

## 2022-05-05 MED ORDER — PREDNISONE 20 MG PO TABS: 40.0000 mg | ORAL_TABLET | Freq: Every day | ORAL | 0 refills | Status: AC

## 2022-05-05 MED ORDER — IPRATROPIUM-ALBUTEROL 0.5-2.5 (3) MG/3ML IN SOLN
RESPIRATORY_TRACT | Status: AC
  Filled 2022-05-05: qty 3

## 2022-05-05 MED ORDER — IPRATROPIUM-ALBUTEROL 0.5-2.5 (3) MG/3ML IN SOLN
3.0000 mL | Freq: Once | RESPIRATORY_TRACT | Status: AC
  Administered 2022-05-05: 3 mL via RESPIRATORY_TRACT

## 2022-05-05 NOTE — ED Triage Notes (Addendum)
PT reports  SHOB x 5 days with Fatigue and cough. Pt labored breathing with ambulation . SPO2  92 RA. Pt's wife had same Sx's

## 2022-05-05 NOTE — Discharge Instructions (Addendum)
Your xray did not show signs of infection. As discussed, you likely have a virus causing symptoms, with exacerbation of your underlying asthma/COPD. I recommend to use albuterol inhaler every 6 hours for the next two days. Please also take the prednisone once daily for the next 5 days.  Please follow up with your pulmonologist in the next week or so.   Please go to the emergency department if symptoms worsen.

## 2022-05-05 NOTE — ED Provider Notes (Signed)
[place]    CSN: [id number] Arrival date & time: 05/05/22  1415     History   Chief Complaint Chief Complaint  Patient presents with   Shortness of Breath   Cough    HPI Christopher Arias is a 61 y.o. male.  Presents with 4 day history of cough and congestion Reports shortness of breath began this morning Denies chest pain No fevers  Wife was sick recently with cough and congestion  Negative covid test 2 days ago Taking robitussin that helps  History of asthma and COPD. Uses daily inhaled steroid, rescue inhaler as needed. Reports used this morning without much change  Past Medical History:  Diagnosis Date   ANTERIOR PITUITARY HYPERFUNCTION 11/23/2009   ANXIETY 11/24/2007   COPD (chronic obstructive pulmonary disease) ([place]) 05/31/2021   GERD 11/24/2007   GYNECOMASTIA 11/10/2009   HYPERLIPIDEMIA 11/24/2007   HYPOTHYROIDISM, POST-RADIATION 03/16/2010    Patient Active Problem List   Diagnosis Date Noted   Moderate obstructive sleep apnea 11/26/2021   History of tobacco use 11/26/2021   COPD (chronic obstructive pulmonary disease) ([place]) 05/31/2021   Fatigue 05/30/2021   Memory change 05/30/2021   Abnormal breathing 05/30/2021   Rash 11/27/2020   COVID-19 virus infection 06/20/2020   Infected sebaceous cyst 11/19/2019   Vitamin D deficiency 09/08/2019   Mild persistent asthma with exacerbation 09/08/2019   Hyperglycemia 03/08/2019   Allergic rhinitis 03/08/2019   Grade IV hemorrhoids 04/08/2018   Hemorrhoids 03/24/2018   External hemorrhoid, thrombosed 01/14/2018   Insomnia 08/28/2016   Cough 06/30/2015   Inappropriate sinus tachycardia 02/03/2015   Smoker 03/12/2012   Hidradenitis 12/19/2010   Encounter for well adult exam with abnormal findings 11/13/2010   Special screening for malignant neoplasm of prostate 08/23/2010   Hypothyroidism 03/16/2010   ANTERIOR PITUITARY HYPERFUNCTION 11/23/2009   GYNECOMASTIA 11/10/2009   Hyperlipidemia 11/24/2007    Anxiety and depression 11/24/2007   GERD 11/24/2007    Past Surgical History:  Procedure Laterality Date   INGUINAL HERNIA REPAIR       Home Medications    Prior to Admission medications   Medication Sig Start Date End Date Taking? Authorizing Provider  predniSONE (DELTASONE) 20 MG tablet Take 2 tablets (40 mg total) by mouth daily with breakfast for 5 days. 05/05/22 05/10/22 Yes Chaelyn Bunyan, Wells Guiles, PA-C  albuterol (VENTOLIN HFA) 108 (90 Base) MCG/ACT inhaler Inhale 2 puffs into the lungs every 6 (six) hours as needed for wheezing or shortness of breath. 05/30/21   Biagio Borg, MD  aspirin 81 MG EC tablet Take 1 tablet (81 mg total) by mouth daily. Swallow whole. 03/11/12   Biagio Borg, MD  atorvastatin (LIPITOR) 20 MG tablet Take 1 tablet (20 mg total) by mouth daily. 05/30/21   Biagio Borg, MD  clonazePAM (KLONOPIN) 1 MG tablet Take 1 tablet (1 mg total) by mouth 2 (two) times daily as needed. 01/09/22   Biagio Borg, MD  fluticasone-salmeterol (ADVAIR) 250-50 MCG/ACT AEPB Inhale 1 puff into the lungs in the morning and at bedtime. 05/30/21   Biagio Borg, MD  levothyroxine (SYNTHROID) 100 MCG tablet Take 1 tablet (100 mcg total) by mouth daily. 05/30/21   Biagio Borg, MD  sertraline (ZOLOFT) 100 MG tablet Take 2 tablets (200 mg total) by mouth daily. 05/30/21   Biagio Borg, MD  triamcinolone (NASACORT) 55 MCG/ACT AERO nasal inhaler Place 2 sprays into the nose daily. 05/30/21   Biagio Borg, MD  varenicline [facility]  CONTINUING MONTH PAK) 1 MG tablet Take 1 tablet (1 mg total) by mouth 2 (two) times daily. 12/28/21   Chesley Mires, MD    Family History Family History  Problem Relation Age of Onset   Heart disease Father    Stroke Other    COPD Other    Heart disease Other    Colon cancer Neg Hx     Social History Social History   Tobacco Use   Smoking status: Former    Packs/day: 1.00    Types: Cigarettes    Quit date: 08/24/2021    Years since quitting: 0.6    Smokeless tobacco: Never  Substance Use Topics   Alcohol use: Yes    Comment: rarely   Drug use: No     Allergies   Atenolol   Review of Systems Review of Systems  Respiratory:  Positive for cough and shortness of breath.    As per HPI  Physical Exam Triage Vital Signs ED Triage Vitals  Enc Vitals Group     BP 05/05/22 1426 (!) 152/89     Pulse Rate 05/05/22 1426 (!) 108     Resp 05/05/22 1426 (!) 24     Temp 05/05/22 1426 98.4 F (36.9 C)     Temp src --      SpO2 05/05/22 1426 92 %     Weight --      Height --      Head Circumference --      Peak Flow --      Pain Score 05/05/22 1428 0     Pain Loc --      Pain Edu? --      Excl. in [place]? --    No data found.  Updated Vital Signs BP 116/69 (BP Location: Right Arm)   Pulse 97   Temp 98.5 F (36.9 C) (Oral)   Resp (!) 28   SpO2 92%   Physical Exam Vitals and nursing note reviewed.  Constitutional:      Appearance: He is not ill-appearing.     Comments: He can speak short sentences but takes deep breath between. No audible wheezing  HENT:     Nose: No congestion.     Mouth/Throat:     Mouth: Mucous membranes are moist.     Pharynx: Oropharynx is clear. No posterior oropharyngeal erythema.  Cardiovascular:     Rate and Rhythm: Normal rate and regular rhythm.     Pulses: Normal pulses.     Heart sounds: Normal heart sounds.  Pulmonary:     Effort: Pulmonary effort is normal. Tachypnea present.     Breath sounds: Decreased breath sounds present. No rhonchi or rales.     Comments: Decreased sounds throughout. Some faint wheezing  Lymphadenopathy:     Cervical: No cervical adenopathy.  Skin:    General: Skin is warm and dry.  Neurological:     Mental Status: He is alert and oriented to person, place, and time.   Tachypnea resolved after DuoNeb treatment He is improved from initial presentation   UC Treatments / Results  Labs (all labs ordered are listed, but only abnormal results are  displayed) Labs Reviewed - No data to display  EKG   Radiology DG Chest 2 View  Result Date: 05/05/2022 CLINICAL DATA:  Shortness of breath.  COPD. EXAM: CHEST - 2 VIEW COMPARISON:  May 30, 2021 FINDINGS: A nodular density measuring up to 1.4 cm projects over the right mid lung, a new  finding. Probable small effusions. Bibasilar opacities favored represent atelectasis. Hyperinflation of the lungs. No pneumothorax. No change in the cardiomediastinal silhouette. IMPRESSION: 1. A 1.4 cm nodular density projects over the right mid lung, a new finding. Recommend a CT scan of the chest for better evaluation. 2. Probable small effusions and bibasilar atelectasis. 3. Hyperinflation of the lungs consistent with emphysema or COPD. 4. No other acute abnormalities. Electronically Signed   By: Dorise Bullion III M.D.   On: 05/05/2022 15:30    Procedures Procedures (including critical care time)  Medications Ordered in UC Medications  ipratropium-albuterol (DUONEB) 0.5-2.5 (3) MG/3ML nebulizer solution 3 mL (3 mLs Nebulization Given 05/05/22 1442)    Initial Impression / Assessment and Plan / UC Course  I have reviewed the triage vital signs and the nursing notes.  Pertinent labs & imaging results that were available during my care of the patient were reviewed by me and considered in my medical decision making (see chart for details).  Afebrile, mildly tachycardic and tachypneic on arrival. O2 around 92-95% in clinic  Duoneb given with some improvement in shortness of breath. Some mucous able to be brought up with the treatment. Chest xray without signs of infection. There may be a new nodule in the R mid lung, radiology recommends follow up with chest CT. Patient does have a pulmonologist he sees.  Defer covid test as he had a negative home test and symptoms already for 4 days  Likely viral etiology causing exacerbation of asthma/copd Recommend use inhaler every 6 hours for the next few  days Prednisone 40 mg daily x 5 days He will follow up with his pulmonologist as well. Discussed ED precautions. Patient agrees to plan  Final Clinical Impressions(s) / UC Diagnoses   Final diagnoses:  COPD exacerbation ([place])  Acute cough  Viral illness     Discharge Instructions      Your xray did not show signs of infection. As discussed, you likely have a virus causing symptoms, with exacerbation of your underlying asthma/COPD. I recommend to use albuterol inhaler every 6 hours for the next two days. Please also take the prednisone once daily for the next 5 days.  Please follow up with your pulmonologist in the next week or so.   Please go to the emergency department if symptoms worsen.     ED Prescriptions     Medication Sig Dispense Auth. Provider   predniSONE (DELTASONE) 20 MG tablet Take 2 tablets (40 mg total) by mouth daily with breakfast for 5 days. 10 tablet Lily Kernen, Wells Guiles, PA-C      PDMP not reviewed this encounter.   [place], [place] 05/05/22 1615

## 2022-05-07 ENCOUNTER — Encounter: Payer: Self-pay | Admitting: Primary Care

## 2022-05-07 ENCOUNTER — Ambulatory Visit: Payer: BC Managed Care – PPO | Admitting: Primary Care

## 2022-05-07 VITALS — BP 136/72 | HR 107 | Temp 98.5°F | Ht 65.0 in | Wt 159.2 lb

## 2022-05-07 DIAGNOSIS — J205 Acute bronchitis due to respiratory syncytial virus: Secondary | ICD-10-CM | POA: Diagnosis not present

## 2022-05-07 DIAGNOSIS — R911 Solitary pulmonary nodule: Secondary | ICD-10-CM

## 2022-05-07 DIAGNOSIS — R918 Other nonspecific abnormal finding of lung field: Secondary | ICD-10-CM | POA: Diagnosis not present

## 2022-05-07 MED ORDER — FLUTICASONE-SALMETEROL 250-50 MCG/ACT IN AEPB: 1.0000 | INHALATION_SPRAY | Freq: Two times a day (BID) | RESPIRATORY_TRACT | 3 refills | Status: DC

## 2022-05-07 MED ORDER — ALBUTEROL SULFATE HFA 108 (90 BASE) MCG/ACT IN AERS: 2.0000 | INHALATION_SPRAY | Freq: Four times a day (QID) | RESPIRATORY_TRACT | 3 refills | Status: DC | PRN

## 2022-05-07 MED ORDER — LEVOFLOXACIN 500 MG PO TABS: 500.0000 mg | ORAL_TABLET | Freq: Every day | ORAL | 0 refills | Status: DC

## 2022-05-07 NOTE — Progress Notes (Signed)
_0  ID: Christopher Arias, male    DOB: Sep 09, 1960, 61 y.o.   MRN: [id number]  Chief Complaint  Patient presents with   Acute Visit    Cough and chest congestion, SOB x 5 days    Referring provider: Biagio Borg, MD  HPI: 61 year old male, former smoker.   05/07/2022 Patient presents today for acute overview.  Patient was seen at urgent care 2 days ago for shortness of breath and cough.  Chest x-ray without acute findings, possible new nodule right middle lung recommending CT chest follow-up.  Diagnosed with COPD exacerbation and given prednisone 40 mg daily x 5 days.  Advised to follow-up with pulmonary office.  Cough started 5 days ago. He has some mucus production with clear sputum  Associated shortness of breath and wheezing  CXR showed nodular density 1.4cm right mid lung, which is new. Small effusion and attelectaiss.  He travels for work and needs a note to return    Allergies  Allergen Reactions   Atenolol Other (See Comments)    Nose bleeds    Immunization History  Administered Date(s) Administered   H1N1 07/19/2008   Influenza,inj,Quad PF,6+ Mos 03/12/2013, 06/17/2014, 03/08/2019, 05/30/2021   PFIZER(Purple Top)SARS-COV-2 Vaccination 08/30/2019, 09/20/2019   Td 07/19/2008   Tdap 09/13/2012   Zoster Recombinat (Shingrix) 11/27/2020, 01/31/2021    Past Medical History:  Diagnosis Date   ANTERIOR PITUITARY HYPERFUNCTION 11/23/2009   ANXIETY 11/24/2007   COPD (chronic obstructive pulmonary disease) ([place]) 05/31/2021   GERD 11/24/2007   GYNECOMASTIA 11/10/2009   HYPERLIPIDEMIA 11/24/2007   HYPOTHYROIDISM, POST-RADIATION 03/16/2010    Tobacco History: Social History   Tobacco Use  Smoking Status Former   Packs/day: 1.00   Years: 20.00   Total pack years: 20.00   Types: Cigarettes   Quit date: 08/24/2021   Years since quitting: 0.7  Smokeless Tobacco Never   Counseling given: Not Answered   Outpatient Medications Prior to Visit  Medication Sig  Dispense Refill   aspirin 81 MG EC tablet Take 1 tablet (81 mg total) by mouth daily. Swallow whole. 30 tablet 12   atorvastatin (LIPITOR) 20 MG tablet Take 1 tablet (20 mg total) by mouth daily. 90 tablet 3   clonazePAM (KLONOPIN) 1 MG tablet Take 1 tablet (1 mg total) by mouth 2 (two) times daily as needed. (Patient taking differently: Take 1 mg by mouth 2 (two) times daily as needed for anxiety.) 60 tablet 2   levothyroxine (SYNTHROID) 100 MCG tablet Take 1 tablet (100 mcg total) by mouth daily. 90 tablet 3   predniSONE (DELTASONE) 20 MG tablet Take 2 tablets (40 mg total) by mouth daily with breakfast for 5 days. (Patient not taking: Reported on 05/10/2022) 10 tablet 0   sertraline (ZOLOFT) 100 MG tablet Take 2 tablets (200 mg total) by mouth daily. 180 tablet 3   triamcinolone (NASACORT) 55 MCG/ACT AERO nasal inhaler Place 2 sprays into the nose daily. (Patient taking differently: Place 2 sprays into the nose daily as needed (allergies).) 1 each 12   varenicline (CHANTIX CONTINUING MONTH PAK) 1 MG tablet Take 1 tablet (1 mg total) by mouth 2 (two) times daily. 60 tablet 2   albuterol (VENTOLIN HFA) 108 (90 Base) MCG/ACT inhaler Inhale 2 puffs into the lungs every 6 (six) hours as needed for wheezing or shortness of breath. 18 g 11   fluticasone-salmeterol (ADVAIR) 250-50 MCG/ACT AEPB Inhale 1 puff into the lungs in the morning and at bedtime. 3 each 3  No facility-administered medications prior to visit.    Review of Systems  Review of Systems  Constitutional:  Negative for fever.  HENT: Negative.    Respiratory:  Positive for cough, shortness of breath and wheezing.    Physical Exam  BP 136/72 (BP Location: Right Arm, Patient Position: Sitting, Cuff Size: Normal)   Pulse (!) 107   Temp 98.5 F (36.9 C) (Oral)   Ht _0  (1.651 m)   Wt 159 lb 3.2 oz (72.2 kg)   SpO2 94%   BMI 26.49 kg/m  Physical Exam Constitutional:      General: He is not in acute distress.    Appearance:  Normal appearance. He is not ill-appearing.  HENT:     Head: Normocephalic and atraumatic.  Cardiovascular:     Rate and Rhythm: Normal rate.  Pulmonary:     Effort: Pulmonary effort is normal.     Breath sounds: No wheezing or rales.     Comments: Reactive cough Musculoskeletal:        General: Normal range of motion.  Skin:    General: Skin is warm and dry.  Neurological:     General: No focal deficit present.     Mental Status: He is alert and oriented to person, place, and time. Mental status is at baseline.  Psychiatric:        Mood and Affect: Mood normal.        Behavior: Behavior normal.        Thought Content: Thought content normal.        Judgment: Judgment normal.      Lab Results:  CBC    Component Value Date/Time   WBC 14.6 (H) 05/11/2022 0500   RBC 4.37 05/11/2022 0500   HGB 13.1 05/11/2022 0500   HCT 38.9 (L) 05/11/2022 0500   PLT 227 05/11/2022 0500   MCV 89.0 05/11/2022 0500   MCH 30.0 05/11/2022 0500   MCHC 33.7 05/11/2022 0500   RDW 13.2 05/11/2022 0500   LYMPHSABS 1.1 05/10/2022 2049   MONOABS 0.8 05/10/2022 2049   EOSABS 0.0 05/10/2022 2049   BASOSABS 0.0 05/10/2022 2049    BMET    Component Value Date/Time   NA 136 05/11/2022 0500   K 3.7 05/11/2022 0500   CL 107 05/11/2022 0500   CO2 22 05/11/2022 0500   GLUCOSE 149 (H) 05/11/2022 0500   BUN 22 05/11/2022 0500   CREATININE 0.89 05/11/2022 0500   CALCIUM 8.6 (L) 05/11/2022 0500   GFRNONAA >60 05/11/2022 0500   GFRAA  07/25/2010 0627    >60        The eGFR has been calculated using the MDRD equation. This calculation has not been validated in all clinical situations. eGFR's persistently <60 mL/min signify possible Chronic Kidney Disease.    BNP    Component Value Date/Time   BNP 63.9 05/10/2022 2049    ProBNP    Component Value Date/Time   PROBNP <30.0 07/25/2010 8828    Imaging: CT ABDOMEN PELVIS WO CONTRAST  Result Date: 05/10/2022 CLINICAL DATA:  Abdominal  wall hematoma. EXAM: CT ABDOMEN AND PELVIS WITHOUT CONTRAST TECHNIQUE: Multidetector CT imaging of the abdomen and pelvis was performed following the standard protocol without IV contrast. RADIATION DOSE REDUCTION: This exam was performed according to the departmental dose-optimization program which includes automated exposure control, adjustment of the mA and/or kV according to patient size and/or use of iterative reconstruction technique. COMPARISON:  05/10/2022. FINDINGS: Lower chest: Heart is normal in size.  A few scattered coronary artery calcifications are noted. Emphysematous changes are noted in the lungs. There is patchy airspace disease in the lower lobes bilaterally. Hepatobiliary: No focal liver abnormality is seen. No gallstones, gallbladder wall thickening, or biliary dilatation. Pancreas: Unremarkable. No pancreatic ductal dilatation or surrounding inflammatory changes. Spleen: Normal in size without focal abnormality. Adrenals/Urinary Tract: The adrenal glands are within normal limits. There is a cyst in the mid right kidney. Excreted contrast is noted at the renal pyramids and collecting systems bilaterally. No hydroureteronephrosis. Bladder is unremarkable. Stomach/Bowel: Stomach is within normal limits. Appendix appears normal. No evidence of bowel wall thickening, distention, or inflammatory changes. No free air or pneumatosis. Vascular/Lymphatic: No significant vascular findings are present. There is a nodular density in the perirectal space on the right measuring 1.6 cm. There is a nodular density adjacent to the prostate gland on the left posteriorly measuring 1.1 cm. There is a nodular density in the perirectal space on the left measuring 9 mm Reproductive: Prostate gland is enlarged. Other: No abdominopelvic ascites. Small fat containing umbilical hernia. Musculoskeletal: There is a hematoma in the rectus sheath in the upper chest and abdomen on the right measuring 8.1 x 4.0 x 8.7 cm with  surrounding fat stranding. Degenerative changes in the thoracic spine. No acute osseous abnormality. IMPRESSION: 1. Right rectus sheath hematoma in the right upper quadrant measuring 8.1 x 4.0 x 8.7 cm. 2. Patchy infiltrates in the lower lobes bilaterally. 3. Emphysema. 4. Nodular densities in the perirectal space bilaterally and adjacent to the prostate gland on the left, possible lymphadenopathy. Clinical correlation is recommended. 5. Enlarged prostate gland. 6. Coronary artery calcifications. 7. Remaining findings as described above. Electronically Signed   By: Brett Fairy M.D.   On: 05/10/2022 23:18   CT Angio Chest PE W and/or Wo Contrast  Result Date: 05/10/2022 CLINICAL DATA:  Pleuritic chest pain, short of breath, recent abnormal chest x-ray EXAM: CT ANGIOGRAPHY CHEST WITH CONTRAST TECHNIQUE: Multidetector CT imaging of the chest was performed using the standard protocol during bolus administration of intravenous contrast. Multiplanar CT image reconstructions and MIPs were obtained to evaluate the vascular anatomy. RADIATION DOSE REDUCTION: This exam was performed according to the departmental dose-optimization program which includes automated exposure control, adjustment of the mA and/or kV according to patient size and/or use of iterative reconstruction technique. CONTRAST:  57m OMNIPAQUE IOHEXOL 350 MG/ML SOLN COMPARISON:  05/05/2022, 05/10/2022 FINDINGS: Cardiovascular: This is a technically adequate evaluation of the pulmonary vasculature. No filling defects or pulmonary emboli. Dilated main pulmonary arteries consistent with pulmonary arterial hypertension. The heart is unremarkable without pericardial effusion. No evidence of thoracic aortic aneurysm or dissection. Mediastinum/Nodes: No enlarged mediastinal, hilar, or axillary lymph nodes. Thyroid gland, trachea, and esophagus demonstrate no significant findings. Lungs/Pleura: Upper lobe predominant emphysema. There is bibasilar bronchial wall  thickening, with areas of mucous plugging and scattered consolidation within the lower lobes. No effusion or pneumothorax. Upper Abdomen: There is abnormal density within the visualized portion of the right rectus musculature, which may reflect rectus sheath hematoma. CT abdomen and pelvis is recommended for complete visualization. Musculoskeletal: No acute or destructive bony lesions. Reconstructed images demonstrate no additional findings. Review of the MIP images confirms the above findings. IMPRESSION: 1. No evidence of pulmonary embolus. 2. Bibasilar bronchial wall thickening, with areas of mucous plugging and likely lower lobe atelectasis as above. Findings consistent with bronchitis or reactive airway disease. 3. Incomplete visualization of right rectus sheath hematoma. Dedicated CT of the abdomen and  pelvis is recommended. 4. Aortic Atherosclerosis (ICD10-I70.0) and Emphysema (ICD10-J43.9). Critical Value/emergent results were called by telephone at the time of interpretation on 05/10/2022 at 10:28 pm to provider [facility] , who verbally acknowledged these results. Electronically Signed   By: Randa Ngo M.D.   On: 05/10/2022 22:34   DG Chest Portable 1 View  Result Date: 05/10/2022 CLINICAL DATA:  Shortness of breath and cough EXAM: PORTABLE CHEST 1 VIEW COMPARISON:  05/05/2022 FINDINGS: Cardiac shadow is within normal limits. The lungs are well aerated bilaterally. Minimal scarring is noted in the bases. No focal confluent infiltrate is seen. No bony abnormality is noted. IMPRESSION: No active disease. Electronically Signed   By: Inez Catalina M.D.   On: 05/10/2022 20:50   DG Chest 2 View  Result Date: 05/05/2022 CLINICAL DATA:  Shortness of breath.  COPD. EXAM: CHEST - 2 VIEW COMPARISON:  May 30, 2021 FINDINGS: A nodular density measuring up to 1.4 cm projects over the right mid lung, a new finding. Probable small effusions. Bibasilar opacities favored represent atelectasis.  Hyperinflation of the lungs. No pneumothorax. No change in the cardiomediastinal silhouette. IMPRESSION: 1. A 1.4 cm nodular density projects over the right mid lung, a new finding. Recommend a CT scan of the chest for better evaluation. 2. Probable small effusions and bibasilar atelectasis. 3. Hyperinflation of the lungs consistent with emphysema or COPD. 4. No other acute abnormalities. Electronically Signed   By: Dorise Bullion III M.D.   On: 05/05/2022 15:30     Assessment & Plan:   Acute bronchitis due to respiratory syncytial virus (RSV) Clinical symptoms and CXR findings are consistent with pneumonia We need to repeat imaging of your lungs with CT scan after completing antibiotic to ensure resolution of density, if not better may need to discuss further diagnostic options   Recommendations: Take mucinex-dm 1240m twice daily x 7-10 days Use flutter valve three times a day x 7-10 days (you can purchase acapella/flutter device on amaz[employer]- see hand out)  Continue Advair morning and evening Use albuterol 2 puffs every 4-6 hours for breakthrough shortness of breath/wheezing Complete prednisone as prescribed   Orders: Ambulatory walk test on room air  CT chest in 4 weeks  (ordered) Please provide patient with letter to return to work with light duty on Monday 05/13/22   Rx: Levaquin 5063mdaily x 7 days   Follow-up: 4 week follow-up with BeEustaquio MaizeP after CT chest      ElMartyn EhrichNP 05/13/2022

## 2022-05-07 NOTE — Patient Instructions (Addendum)
Clinical symptoms and CXR findings are consistent with pneumonia, treatment you as such  We need to repeat imaging of your lungs with CT scan after completing antibiotic to ensure resolution of density, if not better may need to discuss further diagnostic options   Recommendations: Take mucinex-dm '1200mg'$  twice daily x 7-10 days Use flutter valve three times a day x 7-10 days (you can purchase acapella/flutter device on amazon- see hand out)  Continue Advair morning and evening Use albuterol 2 puffs every 4-6 hours for breakthrough shortness of breath/wheezing Complete prednisone as prescribed   Orders: Ambulatory walk test on room air  CT chest in 4 weeks  (ordered) Please provide patient with letter to return to work with light duty on Monday 05/13/22   Rx: Levaquin '500mg'$  daily x 7 days   Follow-up: 4 week follow-up with Eustaquio Maize NP after CT chest

## 2022-05-10 ENCOUNTER — Other Ambulatory Visit: Payer: Self-pay

## 2022-05-10 ENCOUNTER — Encounter (HOSPITAL_COMMUNITY): Payer: Self-pay | Admitting: *Deleted

## 2022-05-10 ENCOUNTER — Inpatient Hospital Stay (HOSPITAL_COMMUNITY)
Admission: EM | Admit: 2022-05-10 | Discharge: 2022-05-12 | DRG: 202 | Disposition: A | Discharge status: Home / Self Care | Payer: BC Managed Care – PPO | Attending: Internal Medicine | Admitting: Internal Medicine

## 2022-05-10 ENCOUNTER — Emergency Department (HOSPITAL_COMMUNITY): Payer: BC Managed Care – PPO

## 2022-05-10 DIAGNOSIS — E785 Hyperlipidemia, unspecified: Secondary | ICD-10-CM | POA: Diagnosis not present

## 2022-05-10 DIAGNOSIS — M7981 Nontraumatic hematoma of soft tissue: Secondary | ICD-10-CM | POA: Diagnosis not present

## 2022-05-10 DIAGNOSIS — Z888 Allergy status to other drugs, medicaments and biological substances status: Secondary | ICD-10-CM | POA: Diagnosis not present

## 2022-05-10 DIAGNOSIS — N402 Nodular prostate without lower urinary tract symptoms: Secondary | ICD-10-CM | POA: Diagnosis not present

## 2022-05-10 DIAGNOSIS — R059 Cough, unspecified: Secondary | ICD-10-CM | POA: Diagnosis not present

## 2022-05-10 DIAGNOSIS — J439 Emphysema, unspecified: Secondary | ICD-10-CM | POA: Diagnosis not present

## 2022-05-10 DIAGNOSIS — B338 Other specified viral diseases: Secondary | ICD-10-CM

## 2022-05-10 DIAGNOSIS — J9601 Acute respiratory failure with hypoxia: Secondary | ICD-10-CM | POA: Diagnosis present

## 2022-05-10 DIAGNOSIS — Z87891 Personal history of nicotine dependence: Secondary | ICD-10-CM

## 2022-05-10 DIAGNOSIS — S3662XA Contusion of rectum, initial encounter: Secondary | ICD-10-CM | POA: Diagnosis not present

## 2022-05-10 DIAGNOSIS — R0602 Shortness of breath: Secondary | ICD-10-CM

## 2022-05-10 DIAGNOSIS — F419 Anxiety disorder, unspecified: Secondary | ICD-10-CM | POA: Diagnosis not present

## 2022-05-10 DIAGNOSIS — J80 Acute respiratory distress syndrome: Secondary | ICD-10-CM | POA: Diagnosis not present

## 2022-05-10 DIAGNOSIS — Z8249 Family history of ischemic heart disease and other diseases of the circulatory system: Secondary | ICD-10-CM

## 2022-05-10 DIAGNOSIS — E039 Hypothyroidism, unspecified: Secondary | ICD-10-CM | POA: Diagnosis not present

## 2022-05-10 DIAGNOSIS — Z825 Family history of asthma and other chronic lower respiratory diseases: Secondary | ICD-10-CM

## 2022-05-10 DIAGNOSIS — Z79899 Other long term (current) drug therapy: Secondary | ICD-10-CM | POA: Diagnosis not present

## 2022-05-10 DIAGNOSIS — K219 Gastro-esophageal reflux disease without esophagitis: Secondary | ICD-10-CM | POA: Diagnosis present

## 2022-05-10 DIAGNOSIS — F32A Depression, unspecified: Secondary | ICD-10-CM | POA: Diagnosis present

## 2022-05-10 DIAGNOSIS — S3011XA Contusion of abdominal wall, initial encounter: Secondary | ICD-10-CM | POA: Diagnosis present

## 2022-05-10 DIAGNOSIS — S301XXA Contusion of abdominal wall, initial encounter: Secondary | ICD-10-CM

## 2022-05-10 DIAGNOSIS — J189 Pneumonia, unspecified organism: Secondary | ICD-10-CM | POA: Diagnosis not present

## 2022-05-10 DIAGNOSIS — Z1152 Encounter for screening for COVID-19: Secondary | ICD-10-CM | POA: Diagnosis not present

## 2022-05-10 DIAGNOSIS — J449 Chronic obstructive pulmonary disease, unspecified: Secondary | ICD-10-CM | POA: Diagnosis present

## 2022-05-10 DIAGNOSIS — J440 Chronic obstructive pulmonary disease with acute lower respiratory infection: Secondary | ICD-10-CM | POA: Diagnosis not present

## 2022-05-10 DIAGNOSIS — Z79890 Hormone replacement therapy (postmenopausal): Secondary | ICD-10-CM | POA: Diagnosis not present

## 2022-05-10 DIAGNOSIS — J441 Chronic obstructive pulmonary disease with (acute) exacerbation: Secondary | ICD-10-CM | POA: Diagnosis present

## 2022-05-10 DIAGNOSIS — J205 Acute bronchitis due to respiratory syncytial virus: Principal | ICD-10-CM | POA: Diagnosis present

## 2022-05-10 DIAGNOSIS — Z7982 Long term (current) use of aspirin: Secondary | ICD-10-CM

## 2022-05-10 DIAGNOSIS — R079 Chest pain, unspecified: Secondary | ICD-10-CM | POA: Diagnosis not present

## 2022-05-10 DIAGNOSIS — Z823 Family history of stroke: Secondary | ICD-10-CM

## 2022-05-10 DIAGNOSIS — N400 Enlarged prostate without lower urinary tract symptoms: Secondary | ICD-10-CM | POA: Diagnosis not present

## 2022-05-10 LAB — CBC WITH DIFFERENTIAL/PLATELET
Abs Immature Granulocytes: 0.18 10*3/uL — ABNORMAL HIGH (ref 0.00–0.07)
Basophils Absolute: 0 10*3/uL (ref 0.0–0.1)
Basophils Relative: 0 %
Eosinophils Absolute: 0 10*3/uL (ref 0.0–0.5)
Eosinophils Relative: 0 %
HCT: 43.1 % (ref 39.0–52.0)
Hemoglobin: 14.5 g/dL (ref 13.0–17.0)
Immature Granulocytes: 2 %
Lymphocytes Relative: 10 %
Lymphs Abs: 1.1 10*3/uL (ref 0.7–4.0)
MCH: 30.2 pg (ref 26.0–34.0)
MCHC: 33.6 g/dL (ref 30.0–36.0)
MCV: 89.8 fL (ref 80.0–100.0)
Monocytes Absolute: 0.8 10*3/uL (ref 0.1–1.0)
Monocytes Relative: 7 %
Neutro Abs: 9.5 10*3/uL — ABNORMAL HIGH (ref 1.7–7.7)
Neutrophils Relative %: 81 %
Platelets: 224 10*3/uL (ref 150–400)
RBC: 4.8 MIL/uL (ref 4.22–5.81)
RDW: 13 % (ref 11.5–15.5)
WBC: 11.6 10*3/uL — ABNORMAL HIGH (ref 4.0–10.5)
nRBC: 0 % (ref 0.0–0.2)

## 2022-05-10 LAB — COMPREHENSIVE METABOLIC PANEL
ALT: 19 U/L (ref 0–44)
AST: 15 U/L (ref 15–41)
Albumin: 3.6 g/dL (ref 3.5–5.0)
Alkaline Phosphatase: 58 U/L (ref 38–126)
Anion gap: 9 (ref 5–15)
BUN: 23 mg/dL (ref 8–23)
CO2: 21 mmol/L — ABNORMAL LOW (ref 22–32)
Calcium: 9 mg/dL (ref 8.9–10.3)
Chloride: 109 mmol/L (ref 98–111)
Creatinine, Ser: 0.93 mg/dL (ref 0.61–1.24)
GFR, Estimated: >60 mL/min (ref >60)
Glucose, Bld: 120 mg/dL — ABNORMAL HIGH (ref 70–99)
Potassium: 3.5 mmol/L (ref 3.5–5.1)
Sodium: 139 mmol/L (ref 135–145)
Total Bilirubin: 0.5 mg/dL (ref 0.3–1.2)
Total Protein: 6.4 g/dL — ABNORMAL LOW (ref 6.5–8.1)

## 2022-05-10 LAB — RESP PANEL BY RT-PCR (RSV, FLU A&B, COVID)  RVPGX2
Influenza A by PCR: NEGATIVE
Influenza B by PCR: NEGATIVE
Resp Syncytial Virus by PCR: POSITIVE — AB
SARS Coronavirus 2 by RT PCR: NEGATIVE

## 2022-05-10 LAB — LACTIC ACID, PLASMA: Lactic Acid, Venous: 1.2 mmol/L (ref 0.5–1.9)

## 2022-05-10 LAB — TROPONIN I (HIGH SENSITIVITY): Troponin I (High Sensitivity): 4 ng/L (ref <18)

## 2022-05-10 LAB — BRAIN NATRIURETIC PEPTIDE: B Natriuretic Peptide: 63.9 pg/mL (ref 0.0–100.0)

## 2022-05-10 MED ORDER — ACETAMINOPHEN 325 MG PO TABS
650.0000 mg | ORAL_TABLET | Freq: Four times a day (QID) | ORAL | Status: DC | PRN
  Administered 2022-05-11: 650 mg via ORAL
  Filled 2022-05-10: qty 2

## 2022-05-10 MED ORDER — ACETAMINOPHEN 650 MG RE SUPP: 650.0000 mg | Freq: Four times a day (QID) | RECTAL | Status: DC | PRN

## 2022-05-10 MED ORDER — LEVOTHYROXINE SODIUM 100 MCG PO TABS
100.0000 ug | ORAL_TABLET | Freq: Every day | ORAL | Status: DC
  Administered 2022-05-11 – 2022-05-12 (×2): 100 ug via ORAL
  Filled 2022-05-10 (×2): qty 1

## 2022-05-10 MED ORDER — SENNOSIDES-DOCUSATE SODIUM 8.6-50 MG PO TABS: 1.0000 | ORAL_TABLET | Freq: Every evening | ORAL | Status: DC | PRN

## 2022-05-10 MED ORDER — VARENICLINE TARTRATE 1 MG PO TABS
1.0000 mg | ORAL_TABLET | Freq: Two times a day (BID) | ORAL | Status: DC
  Administered 2022-05-11 – 2022-05-12 (×4): 1 mg via ORAL
  Filled 2022-05-10 (×5): qty 1

## 2022-05-10 MED ORDER — CLONAZEPAM 1 MG PO TABS
1.0000 mg | ORAL_TABLET | Freq: Two times a day (BID) | ORAL | Status: DC | PRN
  Administered 2022-05-11 (×2): 1 mg via ORAL
  Filled 2022-05-10: qty 2
  Filled 2022-05-10: qty 1

## 2022-05-10 MED ORDER — MOMETASONE FURO-FORMOTEROL FUM 200-5 MCG/ACT IN AERO
2.0000 | INHALATION_SPRAY | Freq: Two times a day (BID) | RESPIRATORY_TRACT | Status: DC
  Administered 2022-05-11 – 2022-05-12 (×3): 2 via RESPIRATORY_TRACT
  Filled 2022-05-10: qty 8.8

## 2022-05-10 MED ORDER — OXYCODONE HCL 5 MG PO TABS
5.0000 mg | ORAL_TABLET | ORAL | Status: DC | PRN
  Administered 2022-05-11 (×3): 5 mg via ORAL
  Filled 2022-05-10 (×3): qty 1

## 2022-05-10 MED ORDER — ONDANSETRON HCL 4 MG PO TABS: 4.0000 mg | ORAL_TABLET | Freq: Four times a day (QID) | ORAL | Status: DC | PRN

## 2022-05-10 MED ORDER — HYDROMORPHONE HCL 1 MG/ML IJ SOLN: 1.0000 mg | Freq: Once | INTRAMUSCULAR | Status: DC

## 2022-05-10 MED ORDER — PREDNISONE 20 MG PO TABS
40.0000 mg | ORAL_TABLET | Freq: Every day | ORAL | Status: DC
  Administered 2022-05-11: 40 mg via ORAL
  Filled 2022-05-10: qty 2

## 2022-05-10 MED ORDER — SERTRALINE HCL 100 MG PO TABS
200.0000 mg | ORAL_TABLET | Freq: Every day | ORAL | Status: DC
  Administered 2022-05-11 – 2022-05-12 (×2): 200 mg via ORAL
  Filled 2022-05-10: qty 4
  Filled 2022-05-10: qty 2

## 2022-05-10 MED ORDER — ALBUTEROL SULFATE HFA 108 (90 BASE) MCG/ACT IN AERS: 2.0000 | INHALATION_SPRAY | Freq: Four times a day (QID) | RESPIRATORY_TRACT | Status: DC | PRN

## 2022-05-10 MED ORDER — ONDANSETRON HCL 4 MG/2ML IJ SOLN: 4.0000 mg | Freq: Four times a day (QID) | INTRAMUSCULAR | Status: DC | PRN

## 2022-05-10 MED ORDER — IOHEXOL 350 MG/ML SOLN
75.0000 mL | Freq: Once | INTRAVENOUS | Status: AC | PRN
  Administered 2022-05-10: 75 mL via INTRAVENOUS

## 2022-05-10 MED ORDER — FENTANYL CITRATE PF 50 MCG/ML IJ SOSY
50.0000 ug | PREFILLED_SYRINGE | Freq: Once | INTRAMUSCULAR | Status: AC
  Administered 2022-05-10: 50 ug via INTRAVENOUS
  Filled 2022-05-10: qty 1

## 2022-05-10 MED ORDER — ATORVASTATIN CALCIUM 20 MG PO TABS
20.0000 mg | ORAL_TABLET | Freq: Every day | ORAL | Status: DC
  Administered 2022-05-11 – 2022-05-12 (×2): 20 mg via ORAL
  Filled 2022-05-10: qty 2
  Filled 2022-05-10: qty 1

## 2022-05-10 MED ORDER — HYDROCODONE BIT-HOMATROP MBR 5-1.5 MG/5ML PO SOLN: 5.0000 mL | ORAL | Status: DC | PRN

## 2022-05-10 MED ORDER — IPRATROPIUM-ALBUTEROL 0.5-2.5 (3) MG/3ML IN SOLN
3.0000 mL | Freq: Three times a day (TID) | RESPIRATORY_TRACT | Status: DC
  Administered 2022-05-11 – 2022-05-12 (×4): 3 mL via RESPIRATORY_TRACT
  Filled 2022-05-10 (×4): qty 3

## 2022-05-10 MED ORDER — ASPIRIN 81 MG PO TBEC: 81.0000 mg | DELAYED_RELEASE_TABLET | Freq: Every day | ORAL | Status: DC

## 2022-05-10 NOTE — Assessment & Plan Note (Signed)
Not much wheezing on admission.  Continue nebulizers, prednisone, supplemental O2 as above.

## 2022-05-10 NOTE — Assessment & Plan Note (Signed)
Continue atorvastatin

## 2022-05-10 NOTE — ED Triage Notes (Signed)
Patient arrives from home via EMS>  Patient with reported dx of pneumonia 5 days ago.  He has been taking antibiotics as directed.  Patient reports he has continued to worse despite medications.  He is complaining of feeling like his chest is tight and he cannot get a full breath.  Patient denies fevers.  He is tolerating fluids.  He has poor appetite.  Ems reports on scene patient had little air movement.  Albuterol '10mg'$  given, atrovent 0.5, magnesium 2 gram, solumedrol '125mg'$  and fentanul 100 mcg by ems.  Patient arrives alert and oriented.  He has improved air movement upon arrival.  He is able to speak more than one word at a time.  No edema noted.  Lungs with decreased breath sounds but no wheezing noted.  Patient has pain in the right upper abdomen.  MD to bedside upon arrival.  RT has also been to bedside to evaluate

## 2022-05-10 NOTE — ED Provider Notes (Signed)
Christopher Arias Provider Note   CSN: [id number] Arrival date & time: 05/10/22  2010     History  No chief complaint on file.   Christopher Arias is a 61 y.o. male.  The history is provided by the patient and medical records. No language interpreter was used.  Shortness of Breath Severity:  Severe Onset quality:  Gradual Duration:  4 days Timing:  Constant Progression:  Waxing and waning Chronicity:  New Context: URI   Relieved by:  Oxygen and inhaler Worsened by:  Nothing Ineffective treatments:  None tried Associated symptoms: abdominal pain, chest pain, cough, sputum production and wheezing   Associated symptoms: no fever, no headaches, no hemoptysis, no neck pain, no rash and no vomiting   Risk factors: no hx of PE/DVT        Home Medications Prior to Admission medications   Medication Sig Start Date End Date Taking? Authorizing Provider  albuterol (VENTOLIN HFA) 108 (90 Base) MCG/ACT inhaler Inhale 2 puffs into the lungs every 6 (six) hours as needed for wheezing or shortness of breath. 05/07/22   Martyn Ehrich, NP  aspirin 81 MG EC tablet Take 1 tablet (81 mg total) by mouth daily. Swallow whole. 03/11/12   Biagio Borg, MD  atorvastatin (LIPITOR) 20 MG tablet Take 1 tablet (20 mg total) by mouth daily. 05/30/21   Biagio Borg, MD  clonazePAM (KLONOPIN) 1 MG tablet Take 1 tablet (1 mg total) by mouth 2 (two) times daily as needed. 01/09/22   Biagio Borg, MD  fluticasone-salmeterol (ADVAIR) 250-50 MCG/ACT AEPB Inhale 1 puff into the lungs in the morning and at bedtime. 05/07/22   Martyn Ehrich, NP  levofloxacin (LEVAQUIN) 500 MG tablet Take 1 tablet (500 mg total) by mouth daily. 05/07/22   Martyn Ehrich, NP  levothyroxine (SYNTHROID) 100 MCG tablet Take 1 tablet (100 mcg total) by mouth daily. 05/30/21   Biagio Borg, MD  predniSONE (DELTASONE) 20 MG tablet Take 2 tablets (40 mg total) by mouth daily with breakfast for 5 days.  05/05/22 05/10/22  Rising, Wells Guiles, PA-C  sertraline (ZOLOFT) 100 MG tablet Take 2 tablets (200 mg total) by mouth daily. 05/30/21   Biagio Borg, MD  triamcinolone (NASACORT) 55 MCG/ACT AERO nasal inhaler Place 2 sprays into the nose daily. 05/30/21   Biagio Borg, MD  varenicline (CHANTIX CONTINUING MONTH PAK) 1 MG tablet Take 1 tablet (1 mg total) by mouth 2 (two) times daily. 12/28/21   Chesley Mires, MD      Allergies    Atenolol    Review of Systems   Review of Systems  Constitutional:  Negative for chills, fatigue and fever.  HENT:  Positive for congestion.   Respiratory:  Positive for cough, sputum production, chest tightness, shortness of breath and wheezing. Negative for hemoptysis.   Cardiovascular:  Positive for chest pain. Negative for palpitations and leg swelling.  Gastrointestinal:  Positive for abdominal pain. Negative for constipation, diarrhea, nausea and vomiting.  Genitourinary:  Negative for dysuria and flank pain.  Musculoskeletal:  Negative for back pain, neck pain and neck stiffness.  Skin:  Negative for rash.  Neurological:  Negative for light-headedness and headaches.  Psychiatric/Behavioral:  Negative for agitation.   All other systems reviewed and are negative.   Physical Exam Updated Vital Signs There were no vitals taken for this visit. Physical Exam Vitals and nursing note reviewed.  Constitutional:      General: He is  not in acute distress.    Appearance: He is well-developed. He is not ill-appearing, toxic-appearing or diaphoretic.  HENT:     Head: Normocephalic and atraumatic.     Mouth/Throat:     Mouth: Mucous membranes are moist.  Eyes:     Conjunctiva/sclera: Conjunctivae normal.     Pupils: Pupils are equal, round, and reactive to light.  Cardiovascular:     Rate and Rhythm: Regular rhythm. Tachycardia present.     Heart sounds: No murmur heard. Pulmonary:     Effort: Pulmonary effort is normal. Tachypnea present. No respiratory  distress.     Breath sounds: Rhonchi present.  Chest:     Chest wall: Tenderness present.  Abdominal:     Palpations: Abdomen is soft.     Tenderness: There is abdominal tenderness.  Musculoskeletal:        General: No swelling.     Cervical back: Neck supple.     Right lower leg: No edema.     Left lower leg: No edema.  Skin:    General: Skin is warm and dry.     Capillary Refill: Capillary refill takes less than 2 seconds.     Findings: No erythema.  Neurological:     General: No focal deficit present.     Mental Status: He is alert.  Psychiatric:        Mood and Affect: Mood normal.     ED Results / Procedures / Treatments   Labs (all labs ordered are listed, but only abnormal results are displayed) Labs Reviewed  RESP PANEL BY RT-PCR (RSV, FLU A&B, COVID)  RVPGX2 - Abnormal; Notable for the following components:      Result Value   Resp Syncytial Virus by PCR POSITIVE (*)    All other components within normal limits  CBC WITH DIFFERENTIAL/PLATELET - Abnormal; Notable for the following components:   WBC 11.6 (*)    Neutro Abs 9.5 (*)    Abs Immature Granulocytes 0.18 (*)    All other components within normal limits  COMPREHENSIVE METABOLIC PANEL - Abnormal; Notable for the following components:   CO2 21 (*)    Glucose, Bld 120 (*)    Total Protein 6.4 (*)    All other components within normal limits  CULTURE, BLOOD (ROUTINE X 2)  CULTURE, BLOOD (ROUTINE X 2)  BRAIN NATRIURETIC PEPTIDE  LACTIC ACID, PLASMA  LACTIC ACID, PLASMA  TROPONIN I (HIGH SENSITIVITY)  TROPONIN I (HIGH SENSITIVITY)    EKG EKG Interpretation  Date/Time:  Friday May 10 2022 20:45:19 EST Ventricular Rate:  93 PR Interval:  219 QRS Duration: 81 QT Interval:  350 QTC Calculation: 436 R Axis:   -77 Text Interpretation: Sinus rhythm Prolonged PR interval Left anterior fascicular block Probable anteroseptal infarct, old ST elevation, consider inferior injury when compared to prior,  faster rate. No STEMI Confirmed by Antony Blackbird [phone]) on 05/10/2022 9:05:46 PM  Radiology CT ABDOMEN PELVIS WO CONTRAST  Result Date: 05/10/2022 CLINICAL DATA:  Abdominal wall hematoma. EXAM: CT ABDOMEN AND PELVIS WITHOUT CONTRAST TECHNIQUE: Multidetector CT imaging of the abdomen and pelvis was performed following the standard protocol without IV contrast. RADIATION DOSE REDUCTION: This exam was performed according to the departmental dose-optimization program which includes automated exposure control, adjustment of the mA and/or kV according to patient size and/or use of iterative reconstruction technique. COMPARISON:  05/10/2022. FINDINGS: Lower chest: Heart is normal in size. A few scattered coronary artery calcifications are noted. Emphysematous changes are noted in  the lungs. There is patchy airspace disease in the lower lobes bilaterally. Hepatobiliary: No focal liver abnormality is seen. No gallstones, gallbladder wall thickening, or biliary dilatation. Pancreas: Unremarkable. No pancreatic ductal dilatation or surrounding inflammatory changes. Spleen: Normal in size without focal abnormality. Adrenals/Urinary Tract: The adrenal glands are within normal limits. There is a cyst in the mid right kidney. Excreted contrast is noted at the renal pyramids and collecting systems bilaterally. No hydroureteronephrosis. Bladder is unremarkable. Stomach/Bowel: Stomach is within normal limits. Appendix appears normal. No evidence of bowel wall thickening, distention, or inflammatory changes. No free air or pneumatosis. Vascular/Lymphatic: No significant vascular findings are present. There is a nodular density in the perirectal space on the right measuring 1.6 cm. There is a nodular density adjacent to the prostate gland on the left posteriorly measuring 1.1 cm. There is a nodular density in the perirectal space on the left measuring 9 mm Reproductive: Prostate gland is enlarged. Other: No abdominopelvic  ascites. Small fat containing umbilical hernia. Musculoskeletal: There is a hematoma in the rectus sheath in the upper chest and abdomen on the right measuring 8.1 x 4.0 x 8.7 cm with surrounding fat stranding. Degenerative changes in the thoracic spine. No acute osseous abnormality. IMPRESSION: 1. Right rectus sheath hematoma in the right upper quadrant measuring 8.1 x 4.0 x 8.7 cm. 2. Patchy infiltrates in the lower lobes bilaterally. 3. Emphysema. 4. Nodular densities in the perirectal space bilaterally and adjacent to the prostate gland on the left, possible lymphadenopathy. Clinical correlation is recommended. 5. Enlarged prostate gland. 6. Coronary artery calcifications. 7. Remaining findings as described above. Electronically Signed   By: Brett Fairy M.D.   On: 05/10/2022 23:18   CT Angio Chest PE W and/or Wo Contrast  Result Date: 05/10/2022 CLINICAL DATA:  Pleuritic chest pain, short of breath, recent abnormal chest x-ray EXAM: CT ANGIOGRAPHY CHEST WITH CONTRAST TECHNIQUE: Multidetector CT imaging of the chest was performed using the standard protocol during bolus administration of intravenous contrast. Multiplanar CT image reconstructions and MIPs were obtained to evaluate the vascular anatomy. RADIATION DOSE REDUCTION: This exam was performed according to the departmental dose-optimization program which includes automated exposure control, adjustment of the mA and/or kV according to patient size and/or use of iterative reconstruction technique. CONTRAST:  6m OMNIPAQUE IOHEXOL 350 MG/ML SOLN COMPARISON:  05/05/2022, 05/10/2022 FINDINGS: Cardiovascular: This is a technically adequate evaluation of the pulmonary vasculature. No filling defects or pulmonary emboli. Dilated main pulmonary arteries consistent with pulmonary arterial hypertension. The heart is unremarkable without pericardial effusion. No evidence of thoracic aortic aneurysm or dissection. Mediastinum/Nodes: No enlarged mediastinal,  hilar, or axillary lymph nodes. Thyroid gland, trachea, and esophagus demonstrate no significant findings. Lungs/Pleura: Upper lobe predominant emphysema. There is bibasilar bronchial wall thickening, with areas of mucous plugging and scattered consolidation within the lower lobes. No effusion or pneumothorax. Upper Abdomen: There is abnormal density within the visualized portion of the right rectus musculature, which may reflect rectus sheath hematoma. CT abdomen and pelvis is recommended for complete visualization. Musculoskeletal: No acute or destructive bony lesions. Reconstructed images demonstrate no additional findings. Review of the MIP images confirms the above findings. IMPRESSION: 1. No evidence of pulmonary embolus. 2. Bibasilar bronchial wall thickening, with areas of mucous plugging and likely lower lobe atelectasis as above. Findings consistent with bronchitis or reactive airway disease. 3. Incomplete visualization of right rectus sheath hematoma. Dedicated CT of the abdomen and pelvis is recommended. 4. Aortic Atherosclerosis (ICD10-I70.0) and Emphysema (ICD10-J43.9). Critical Value/emergent results  were called by telephone at the time of interpretation on 05/10/2022 at 10:28 pm to provider [facility] , who verbally acknowledged these results. Electronically Signed   By: Randa Ngo M.D.   On: 05/10/2022 22:34   DG Chest Portable 1 View  Result Date: 05/10/2022 CLINICAL DATA:  Shortness of breath and cough EXAM: PORTABLE CHEST 1 VIEW COMPARISON:  05/05/2022 FINDINGS: Cardiac shadow is within normal limits. The lungs are well aerated bilaterally. Minimal scarring is noted in the bases. No focal confluent infiltrate is seen. No bony abnormality is noted. IMPRESSION: No active disease. Electronically Signed   By: Inez Catalina M.D.   On: 05/10/2022 20:50    Procedures Procedures    Medications Ordered in ED Medications  HYDROmorphone (DILAUDID) injection 1 mg (has no administration  in time range)  iohexol (OMNIPAQUE) 350 MG/ML injection 75 mL (75 mLs Intravenous Contrast Given 05/10/22 2206)  fentaNYL (SUBLIMAZE) injection 50 mcg (50 mcg Intravenous Given 05/10/22 2253)    ED Course/ Medical Decision Making/ A&P                           Medical Decision Making Amount and/or Complexity of Data Reviewed Labs: ordered. Radiology: ordered.  Risk Prescription drug management. Decision regarding hospitalization.    Christopher Arias is a 61 y.o. male with a past medical history significant for COPD, GERD, hyperlipidemia, hypothyroidism, and recent diagnosis of pneumonia currently on Levaquin therapy who presents for pleuritic chest/abdominal pain, shortness of breath, respiratory distress, and cough.  According to EMS, patient was having worsening breathing and was in respiratory distress with accessory muscle use.  Patient was having wheezing and rhonchi with them and so patient was given a DuoNeb, 125 Solu-Medrol, 2 g of magnesium, and has been on high flow oxygen.  Patient says that he has had coughing for the last several days but has not had fevers or chills.  He was diagnosed with pneumonia and started on Levaquin and he is several days into that but still worsening.  He reports during a coughing fit earlier he was having pain in his right chest going towards his right upper abdomen.  He denies nausea, vomiting, constipation, diarrhea, or urinary changes.  Denies sick contacts to his knowledge.  On arrival, breath sounds are coarse bilaterally.  There was diminished breath sounds diffusely and minimal wheezing now that he has had the breathing treatment and medications with EMS.  Right side of his chest and abdomen are tender to palpation and there is some swelling palpated.  Bowel sounds are appreciated.  Legs nontender and nonedematous and patient otherwise has good pulses in extremities.  EKG does not show STEMI.     Patient reports that he is scheduled to get a CT scan  after the pneumonia is completed to rule out other abnormalities on the chest x-ray.  Clinically given the pleuritic chest pain, shortness of breath, tachycardia, and wrist were distress, we will get x-ray and a CT PE study to rule out acute pulm embolism, pneumonia, or other acute abnormality.  We will check for COVID flu and RSV.  Will get troponin, BNP, lactic acid, and other labs.  Patient's COVID and flu test was negative but he is RSV positive.  BNP normal and troponin normal.  Lactic acid not elevated and he has mild leukocytosis 11.6.  Metabolic panel reassuring.  CT scan does not show pulmonary embolism but does show evidence of a right abdominal wall  sheath hematoma.  There is no active extravasation seen.  I spoke with radiology in the phone who felt it was reasonable to get a noncontrast abdomen CT to see the extent of this hematoma but otherwise they did not think contrast will be needed as they have low suspicion for active extravasation.  CT chest otherwise did not show large pneumonia but did show evidence of bronchitis and mucous plugging.  Clinically I suspect patient has RSV exacerbating his respiratory difficulties at baseline and then the coughing fit led to the abdominal and chest wall hematoma.  Due to his 4 L of oxygen requirement, the respiratory distress, and this pain from hematoma, will call for admission for further management.  Will get the noncontrast CT scan however I do not think this will likely change any disposition at this point.  Will call for admission.  I spoke to general surgery with Dr. Georgette Dover who did not feel this patient would need any surgical management or any other management for this hematoma as it is in the sheath and therefore will self tamponade and eventually be absorbed.  Will call medicine for admission.         Final Clinical Impression(s) / ED Diagnoses Final diagnoses:  RSV infection  Shortness of breath  Hematoma of rectus sheath,  initial encounter    Clinical Impression: 1. RSV infection   2. Shortness of breath   3. Hematoma of rectus sheath, initial encounter     Disposition: Admit  This note was prepared with assistance of Dragon voice recognition software. Occasional wrong-word or sound-a-like substitutions may have occurred due to the inherent limitations of voice recognition software.     Gustav Knueppel, Gwenyth Allegra, MD 05/10/22 [phone]

## 2022-05-10 NOTE — H&P (Signed)
History and Physical    Christopher Arias IRJ:[id number] DOB: Nov 13, 1960 DOA: 05/10/2022  PCP: Biagio Borg, MD  Patient coming from: Home  I have personally briefly reviewed patient's old medical records in [place]  Chief Complaint: Shortness of breath  HPI: Christopher Arias is a 61 y.o. male with medical history significant for COPD, HLD, hypothyroidism, depression/anxiety who presented to the ED for evaluation of dyspnea.  Patient initially went to urgent care 05/05/2022 for 4 days of cough, congestion and new dyspnea on that same day.  CXR showed a nodule in the right midlung, small effusions, bibasilar atelectasis.  He was diagnosed with COPD exacerbation and prescribed a 5-day course of prednisone.  He was seen as follow-up in the pulmonology clinic on 12/5.  He was started on a 7-day course of Levaquin for presumed pneumonia.  Patient states that he has been having continued dyspnea at rest, frequent cough productive of white sputum, and fatigue.  He has developed right upper quadrant abdominal pain due to frequent coughing.  He says he is does not require supplemental O2 at baseline.  Dyspnea worsened to the point he had to call EMS.  Per ED triage documentation, patient had little air movement on EMS arrival.  He was given albuterol 10 mg, Atrovent 0.5, magnesium 2 g, Solu-Medrol 125 mg by EMS prior to arrival.  ED Course  Labs/Imaging on admission: I have personally reviewed following labs and imaging studies.  Initial vitals showed BP 145/87, pulse 90, RR 13, temp 98.2 F, SpO2 96% on 4 L O2 via [place].  Labs showed WBC 11.6, hemoglobin 14.5, platelets 224,000, sodium 139, potassium 3.5, bicarb 21, BUN 23, creatinine 0.93, serum glucose 120, LFTs within normal limits, troponin 4, lactic acid 1.2.  RSV positive.  COVID and influenza PCR negative.  BNP 63.9.  Blood cultures in process.  CTA chest negative for PE.  Bibasilar bronchial wall thickening with areas of mucous plugging  and lower lobe atelectasis noted.  Incomplete visualization of right rectus sheath hematoma noted.  CT abdomen/pelvis without contrast showed the right rectus sheath hematoma in the RUQ measuring 8.1 x 4.0 x 8.7 cm.  Nodular densities in the perirectal space bilaterally and adjacent to the prostate gland on the left noted.  EDP discussed with on-call general surgery who felt the rectus sheath hematoma did not require any further management and that it would self tamponade and eventually be absorbed.  The hospitalist service was consulted to admit for further evaluation and management.  Review of Systems: All systems reviewed and are negative except as documented in history of present illness above.   Past Medical History:  Diagnosis Date   ANTERIOR PITUITARY HYPERFUNCTION 11/23/2009   ANXIETY 11/24/2007   COPD (chronic obstructive pulmonary disease) ([place]) 05/31/2021   GERD 11/24/2007   GYNECOMASTIA 11/10/2009   HYPERLIPIDEMIA 11/24/2007   HYPOTHYROIDISM, POST-RADIATION 03/16/2010    Past Surgical History:  Procedure Laterality Date   INGUINAL HERNIA REPAIR      Social History:  reports that he quit smoking about 8 months ago. His smoking use included cigarettes. He has a 20.00 pack-year smoking history. He has never used smokeless tobacco. He reports current alcohol use. He reports that he does not use drugs.  Allergies  Allergen Reactions   Atenolol Other (See Comments)    Nose bleeds    Family History  Problem Relation Age of Onset   Heart disease Father    Stroke Other    COPD Other  Heart disease Other    Colon cancer Neg Hx      Prior to Admission medications   Medication Sig Start Date End Date Taking? Authorizing Provider  albuterol (VENTOLIN HFA) 108 (90 Base) MCG/ACT inhaler Inhale 2 puffs into the lungs every 6 (six) hours as needed for wheezing or shortness of breath. 05/07/22   Martyn Ehrich, NP  aspirin 81 MG EC tablet Take 1 tablet (81 mg total) by mouth  daily. Swallow whole. 03/11/12   Biagio Borg, MD  atorvastatin (LIPITOR) 20 MG tablet Take 1 tablet (20 mg total) by mouth daily. 05/30/21   Biagio Borg, MD  clonazePAM (KLONOPIN) 1 MG tablet Take 1 tablet (1 mg total) by mouth 2 (two) times daily as needed. 01/09/22   Biagio Borg, MD  fluticasone-salmeterol (ADVAIR) 250-50 MCG/ACT AEPB Inhale 1 puff into the lungs in the morning and at bedtime. 05/07/22   Martyn Ehrich, NP  levofloxacin (LEVAQUIN) 500 MG tablet Take 1 tablet (500 mg total) by mouth daily. 05/07/22   Martyn Ehrich, NP  levothyroxine (SYNTHROID) 100 MCG tablet Take 1 tablet (100 mcg total) by mouth daily. 05/30/21   Biagio Borg, MD  predniSONE (DELTASONE) 20 MG tablet Take 2 tablets (40 mg total) by mouth daily with breakfast for 5 days. 05/05/22 05/10/22  Rising, Wells Guiles, PA-C  sertraline (ZOLOFT) 100 MG tablet Take 2 tablets (200 mg total) by mouth daily. 05/30/21   Biagio Borg, MD  triamcinolone (NASACORT) 55 MCG/ACT AERO nasal inhaler Place 2 sprays into the nose daily. 05/30/21   Biagio Borg, MD  varenicline (CHANTIX CONTINUING MONTH PAK) 1 MG tablet Take 1 tablet (1 mg total) by mouth 2 (two) times daily. 12/28/21   Chesley Mires, MD    Physical Exam: Vitals:   05/10/22 2024 05/10/22 2029 05/10/22 2130 05/10/22 2200  BP: (!) 145/87  (!) 141/61 123/84  Pulse: 89  99 (!) 104  Resp: 13  (!) 23 17  Temp: 98.2 F (36.8 C)     TempSrc: Oral     SpO2: 96%  92% 92%  Weight:  72.2 kg    Height:  '5\' 5"'$  (1.651 m)     Constitutional: Resting in bed with head elevated, NAD, calm, comfortable Eyes: EOMI, lids and conjunctivae normal ENMT: Mucous membranes are moist. Posterior pharynx clear of any exudate or lesions.Normal dentition.  Neck: normal, supple, no masses. Respiratory: Distant breath sounds, mild upper airway wheezing. Normal respiratory effort. No accessory muscle use.  Cardiovascular: Regular rate and rhythm, no murmurs / rubs / gallops. No extremity  edema. 2+ pedal pulses. Abdomen: Mild area of swelling RUQ, tender to light palpation  Musculoskeletal: no clubbing / cyanosis. No joint deformity upper and lower extremities. Good ROM, no contractures. Normal muscle tone.  Skin: no rashes, lesions, ulcers. No induration Neurologic: Sensation intact. Strength 5/5 in all 4.  Psychiatric: Normal judgment and insight. Alert and oriented x 3. Normal mood.   EKG: Personally reviewed. Sinus rhythm, rate 93, first-degree AV block, LAFB.  Assessment/Plan Principal Problem:   Acute respiratory failure with hypoxia (HCC) Active Problems:   Acute bronchitis due to respiratory syncytial virus (RSV)   COPD (chronic obstructive pulmonary disease) (HCC)   Rectus sheath hematoma, initial encounter   Hypothyroidism   Hyperlipidemia   Anxiety and depression   Christopher Arias is a 61 y.o. male with medical history significant for COPD, HLD, hypothyroidism, depression/anxiety who is admitted with acute respiratory failure with  hypoxia due to RSV bronchitis/reactive airway process.  Assessment and Plan: * Acute respiratory failure with hypoxia (HCC) RSV associated bronchitis/reactive airway Requiring new 2-4 L O2 via [place] to maintain SpO2 >92%.  Does not require supplemental O2 at baseline.  COVID and flu negative.  CTA chest negative for PE, shows bibasilar bronchial wall thickening and lower lobe atelectasis. -Continue prednisone 40 mg daily -Scheduled DuoNebs with albuterol as needed -Continue Dulera -Wean supplemental O2 as able  COPD (chronic obstructive pulmonary disease) (HCC) Not much wheezing on admission.  Continue nebulizers, prednisone, supplemental O2 as above.  Rectus sheath hematoma, initial encounter Noted on CT imaging in the right upper quadrant measuring 8.1 x 4.0 x 8.7 cm.  Not on blood thinners as an outpatient other than aspirin.  Suspect developed secondary to frequent cough.  EDP discussed with on-call general surgery, no  intervention necessary, anticipate this will self resolve.  Anxiety and depression Continue home Klonopin and sertraline.  Hyperlipidemia Continue atorvastatin.  Hypothyroidism Continue Synthroid.  DVT prophylaxis: SCDs Start: 05/10/22 2351 Code Status: Full code, confirmed with patient on admission Family Communication: Discussed with patient, he has discussed with family Disposition Plan: From home and likely discharge to home pending clinical progress Consults called: None Severity of Illness: The appropriate patient status for this patient is OBSERVATION. Observation status is judged to be reasonable and necessary in order to provide the required intensity of service to ensure the patient's safety. The patient's presenting symptoms, physical exam findings, and initial radiographic and laboratory data in the context of their medical condition is felt to place them at decreased risk for further clinical deterioration. Furthermore, it is anticipated that the patient will be medically stable for discharge from the hospital within 2 midnights of admission.   Zada Finders MD [facility]  If 7PM-7AM, please contact night-coverage [email or web address]  05/10/2022, 11:59 PM

## 2022-05-10 NOTE — Assessment & Plan Note (Signed)
Noted on CT imaging in the right upper quadrant measuring 8.1 x 4.0 x 8.7 cm.  Not on blood thinners as an outpatient other than aspirin.  Suspect developed secondary to frequent cough.  EDP discussed with on-call general surgery, no intervention necessary, anticipate this will self resolve.

## 2022-05-10 NOTE — Assessment & Plan Note (Signed)
Continue Synthroid °

## 2022-05-10 NOTE — Assessment & Plan Note (Signed)
Continue home Klonopin and sertraline.

## 2022-05-10 NOTE — Assessment & Plan Note (Signed)
Requiring new 2-4 L O2 via [place] to maintain SpO2 >92%.  Does not require supplemental O2 at baseline.  COVID and flu negative.  CTA chest negative for PE, shows bibasilar bronchial wall thickening and lower lobe atelectasis - wean O2 as able and ambulate -Still requiring oxygen with ambulation and patient will be set up for home oxygen at discharge

## 2022-05-10 NOTE — Hospital Course (Addendum)
Christopher Arias is a 61 y.o. male with medical history significant for COPD, HLD, hypothyroidism, depression/anxiety who is admitted with acute respiratory failure with hypoxia due to RSV bronchitis/reactive airway process. He had felt ill for approximately 1 week prior to hospitalization and had also been seen outpatient by pulmonology on 05/07/2022.  He was started on Levaquin, 7-day course and had 2 days left upon admission.  He did not feel any improvement after having started antibiotics. Due to excessive coughing and ongoing discomfort and malaise, he presented for further evaluation. He underwent workup with CXR, CTA chest and CT abdomen/pelvis. Negative PE, bibasilar bronchial wall thickening with areas of mucous plugging and lower lobe atelectasis appreciated.  Also found to have right rectus sheath hematoma in the right upper quadrant measuring 8.1 x 4 x 8.7 cm. He was admitted for pain control and breathing treatments.

## 2022-05-11 DIAGNOSIS — R0602 Shortness of breath: Secondary | ICD-10-CM | POA: Diagnosis present

## 2022-05-11 DIAGNOSIS — Z7982 Long term (current) use of aspirin: Secondary | ICD-10-CM | POA: Diagnosis not present

## 2022-05-11 DIAGNOSIS — F32A Depression, unspecified: Secondary | ICD-10-CM | POA: Diagnosis present

## 2022-05-11 DIAGNOSIS — Z8249 Family history of ischemic heart disease and other diseases of the circulatory system: Secondary | ICD-10-CM | POA: Diagnosis not present

## 2022-05-11 DIAGNOSIS — J9601 Acute respiratory failure with hypoxia: Secondary | ICD-10-CM | POA: Diagnosis present

## 2022-05-11 DIAGNOSIS — M7981 Nontraumatic hematoma of soft tissue: Secondary | ICD-10-CM | POA: Diagnosis present

## 2022-05-11 DIAGNOSIS — E785 Hyperlipidemia, unspecified: Secondary | ICD-10-CM | POA: Diagnosis present

## 2022-05-11 DIAGNOSIS — J441 Chronic obstructive pulmonary disease with (acute) exacerbation: Secondary | ICD-10-CM | POA: Diagnosis present

## 2022-05-11 DIAGNOSIS — Z79890 Hormone replacement therapy (postmenopausal): Secondary | ICD-10-CM | POA: Diagnosis not present

## 2022-05-11 DIAGNOSIS — J440 Chronic obstructive pulmonary disease with acute lower respiratory infection: Secondary | ICD-10-CM | POA: Diagnosis present

## 2022-05-11 DIAGNOSIS — K219 Gastro-esophageal reflux disease without esophagitis: Secondary | ICD-10-CM | POA: Diagnosis present

## 2022-05-11 DIAGNOSIS — Z823 Family history of stroke: Secondary | ICD-10-CM | POA: Diagnosis not present

## 2022-05-11 DIAGNOSIS — Z79899 Other long term (current) drug therapy: Secondary | ICD-10-CM | POA: Diagnosis not present

## 2022-05-11 DIAGNOSIS — J205 Acute bronchitis due to respiratory syncytial virus: Secondary | ICD-10-CM

## 2022-05-11 DIAGNOSIS — S301XXA Contusion of abdominal wall, initial encounter: Secondary | ICD-10-CM

## 2022-05-11 DIAGNOSIS — E039 Hypothyroidism, unspecified: Secondary | ICD-10-CM | POA: Diagnosis present

## 2022-05-11 DIAGNOSIS — Z825 Family history of asthma and other chronic lower respiratory diseases: Secondary | ICD-10-CM | POA: Diagnosis not present

## 2022-05-11 DIAGNOSIS — Z888 Allergy status to other drugs, medicaments and biological substances status: Secondary | ICD-10-CM | POA: Diagnosis not present

## 2022-05-11 DIAGNOSIS — Z1152 Encounter for screening for COVID-19: Secondary | ICD-10-CM | POA: Diagnosis not present

## 2022-05-11 DIAGNOSIS — Z87891 Personal history of nicotine dependence: Secondary | ICD-10-CM | POA: Diagnosis not present

## 2022-05-11 DIAGNOSIS — F419 Anxiety disorder, unspecified: Secondary | ICD-10-CM | POA: Diagnosis present

## 2022-05-11 LAB — TROPONIN I (HIGH SENSITIVITY): Troponin I (High Sensitivity): 4 ng/L (ref <18)

## 2022-05-11 LAB — BASIC METABOLIC PANEL
Anion gap: 7 (ref 5–15)
BUN: 22 mg/dL (ref 8–23)
CO2: 22 mmol/L (ref 22–32)
Calcium: 8.6 mg/dL — ABNORMAL LOW (ref 8.9–10.3)
Chloride: 107 mmol/L (ref 98–111)
Creatinine, Ser: 0.89 mg/dL (ref 0.61–1.24)
GFR, Estimated: >60 mL/min (ref >60)
Glucose, Bld: 149 mg/dL — ABNORMAL HIGH (ref 70–99)
Potassium: 3.7 mmol/L (ref 3.5–5.1)
Sodium: 136 mmol/L (ref 135–145)

## 2022-05-11 LAB — CBC
HCT: 38.9 % — ABNORMAL LOW (ref 39.0–52.0)
Hemoglobin: 13.1 g/dL (ref 13.0–17.0)
MCH: 30 pg (ref 26.0–34.0)
MCHC: 33.7 g/dL (ref 30.0–36.0)
MCV: 89 fL (ref 80.0–100.0)
Platelets: 227 10*3/uL (ref 150–400)
RBC: 4.37 MIL/uL (ref 4.22–5.81)
RDW: 13.2 % (ref 11.5–15.5)
WBC: 14.6 10*3/uL — ABNORMAL HIGH (ref 4.0–10.5)
nRBC: 0 % (ref 0.0–0.2)

## 2022-05-11 LAB — LACTIC ACID, PLASMA: Lactic Acid, Venous: 1.4 mmol/L (ref 0.5–1.9)

## 2022-05-11 LAB — HIV ANTIBODY (ROUTINE TESTING W REFLEX): HIV Screen 4th Generation wRfx: NONREACTIVE

## 2022-05-11 MED ORDER — METHYLPREDNISOLONE SODIUM SUCC 125 MG IJ SOLR
125.0000 mg | Freq: Once | INTRAMUSCULAR | Status: AC
  Administered 2022-05-11: 125 mg via INTRAVENOUS
  Filled 2022-05-11: qty 2

## 2022-05-11 MED ORDER — ALBUTEROL SULFATE (2.5 MG/3ML) 0.083% IN NEBU: 2.5000 mg | INHALATION_SOLUTION | Freq: Four times a day (QID) | RESPIRATORY_TRACT | Status: DC | PRN

## 2022-05-11 MED ORDER — METHYLPREDNISOLONE SODIUM SUCC 40 MG IJ SOLR
40.0000 mg | Freq: Every day | INTRAMUSCULAR | Status: DC
  Administered 2022-05-12: 40 mg via INTRAVENOUS
  Filled 2022-05-11: qty 1

## 2022-05-11 MED ORDER — ACETAMINOPHEN 325 MG PO TABS
650.0000 mg | ORAL_TABLET | ORAL | Status: DC | PRN
  Administered 2022-05-11 – 2022-05-12 (×2): 650 mg via ORAL
  Filled 2022-05-11 (×2): qty 2

## 2022-05-11 MED ORDER — IPRATROPIUM-ALBUTEROL 0.5-2.5 (3) MG/3ML IN SOLN: 3.0000 mL | Freq: Four times a day (QID) | RESPIRATORY_TRACT | Status: DC | PRN

## 2022-05-11 MED ORDER — HYDROCOD POLI-CHLORPHE POLI ER 10-8 MG/5ML PO SUER
5.0000 mL | Freq: Two times a day (BID) | ORAL | Status: DC
  Administered 2022-05-11 – 2022-05-12 (×3): 5 mL via ORAL
  Filled 2022-05-11 (×3): qty 5

## 2022-05-11 NOTE — Progress Notes (Signed)
Progress Note    Christopher Arias   UQJ:[id number]  DOB: 1960-11-02  DOA: 05/10/2022     0 PCP: Biagio Borg, MD  Initial CC: SOB, cough  Hospital Course: Christopher Arias is a 61 y.o. male with medical history significant for COPD, HLD, hypothyroidism, depression/anxiety who is admitted with acute respiratory failure with hypoxia due to RSV bronchitis/reactive airway process. He had felt ill for approximately 1 week prior to hospitalization and had also been seen outpatient by pulmonology on 05/07/2022.  He was started on Levaquin, 7-day course and had 2 days left upon admission.  He did not feel any improvement after having started antibiotics. Due to excessive coughing and ongoing discomfort and malaise, he presented for further evaluation. He underwent workup with CXR, CTA chest and CT abdomen/pelvis. Negative PE, bibasilar bronchial wall thickening with areas of mucous plugging and lower lobe atelectasis appreciated.  Also found to have right rectus sheath hematoma in the right upper quadrant measuring 8.1 x 4 x 8.7 cm. He was admitted for pain control and breathing treatments.  Interval History:  Still feels very dyspneic and having ongoing pain in abdomen.  Despite ambulating on room air with no hypoxia, he became tachycardic and significantly dyspneic.  Not felt to be safe for home today and will monitor further 1 more night.  Assessment and Plan: * Acute respiratory failure with hypoxia (HCC) Requiring new 2-4 L O2 via [place] to maintain SpO2 >92%.  Does not require supplemental O2 at baseline.  COVID and flu negative.  CTA chest negative for PE, shows bibasilar bronchial wall thickening and lower lobe atelectasis - wean O2 as able and ambulate  Acute bronchitis due to respiratory syncytial virus (RSV) - RSV positive on admission -Patient still too dyspneic and short of breath for discharging home especially with large rectus sheath hematoma from severe coughing -Was on 5 days of Levaquin  outpatient prior to admission with no improvement, therefore will not continue remainder of course which is 2 more days -Change steroid to IV - Continue scheduled and as needed DuoNebs - Continue Dulera - Scheduled Tussionex  COPD (chronic obstructive pulmonary disease) ([place]) - see RSV  Rectus sheath hematoma, initial encounter - Noted on CT imaging in the right upper quadrant measuring 8.1 x 4.0 x 8.7 cm.  Not on blood thinners as an outpatient other than aspirin.  Suspect developed secondary to frequent cough.  EDP discussed with on-call general surgery, no intervention necessary, anticipate this will self resolve.  Anxiety and depression Continue home Klonopin and sertraline.  Hyperlipidemia Continue atorvastatin.  Hypothyroidism Continue Synthroid.   Old records reviewed in assessment of this patient  Antimicrobials:   DVT prophylaxis:  SCDs Start: 05/10/22 2351   Code Status:   Code Status: Full Code  Mobility Assessment (last 72 hours)     Mobility Assessment   No documentation.           Barriers to discharge:  Disposition Plan:  Home Sunday  Status is: Obs  Objective: Blood pressure 128/67, pulse (!) 115, temperature 97.7 F (36.5 C), temperature source Oral, resp. rate (!) 25, height '5\' 5"'$  (1.651 m), weight 72.2 kg, SpO2 90 %.  Examination:  Physical Exam Constitutional:      Appearance: Normal appearance.     Comments: Fatigued appearing  HENT:     Head: Normocephalic and atraumatic.     Mouth/Throat:     Mouth: Mucous membranes are moist.  Eyes:     Extraocular Movements:  Extraocular movements intact.  Cardiovascular:     Rate and Rhythm: Normal rate and regular rhythm.     Heart sounds: Normal heart sounds.  Pulmonary:     Effort: Pulmonary effort is normal.     Comments: Diffuse coarse breath sounds bilaterally, with expiratory wheezing Abdominal:     General: Bowel sounds are normal. There is no distension.     Palpations: Abdomen is  soft.     Tenderness: There is no abdominal tenderness.  Musculoskeletal:        General: Normal range of motion.     Cervical back: Normal range of motion and neck supple.  Skin:    General: Skin is warm and dry.  Neurological:     General: No focal deficit present.     Mental Status: He is alert.  Psychiatric:        Mood and Affect: Mood normal.        Behavior: Behavior normal.      Consultants:    Procedures:    Data Reviewed: Results for orders placed or performed during the hospital encounter of 05/10/22 (from the past 24 hour(s))  CBC with Differential     Status: Abnormal   Collection Time: 05/10/22  8:49 PM  Result Value Ref Range   WBC 11.6 (H) 4.0 - 10.5 K/uL   RBC 4.80 4.22 - 5.81 MIL/uL   Hemoglobin 14.5 13.0 - 17.0 g/dL   HCT 43.1 39.0 - 52.0 %   MCV 89.8 80.0 - 100.0 fL   MCH 30.2 26.0 - 34.0 pg   MCHC 33.6 30.0 - 36.0 g/dL   RDW 13.0 11.5 - 15.5 %   Platelets 224 150 - 400 K/uL   nRBC 0.0 0.0 - 0.2 %   Neutrophils Relative % 81 %   Neutro Abs 9.5 (H) 1.7 - 7.7 K/uL   Lymphocytes Relative 10 %   Lymphs Abs 1.1 0.7 - 4.0 K/uL   Monocytes Relative 7 %   Monocytes Absolute 0.8 0.1 - 1.0 K/uL   Eosinophils Relative 0 %   Eosinophils Absolute 0.0 0.0 - 0.5 K/uL   Basophils Relative 0 %   Basophils Absolute 0.0 0.0 - 0.1 K/uL   Immature Granulocytes 2 %   Abs Immature Granulocytes 0.18 (H) 0.00 - 0.07 K/uL  Comprehensive metabolic panel     Status: Abnormal   Collection Time: 05/10/22  8:49 PM  Result Value Ref Range   Sodium 139 135 - 145 mmol/L   Potassium 3.5 3.5 - 5.1 mmol/L   Chloride 109 98 - 111 mmol/L   CO2 21 (L) 22 - 32 mmol/L   Glucose, Bld 120 (H) 70 - 99 mg/dL   BUN 23 8 - 23 mg/dL   Creatinine, Ser 0.93 0.61 - 1.24 mg/dL   Calcium 9.0 8.9 - 10.3 mg/dL   Total Protein 6.4 (L) 6.5 - 8.1 g/dL   Albumin 3.6 3.5 - 5.0 g/dL   AST 15 15 - 41 U/L   ALT 19 0 - 44 U/L   Alkaline Phosphatase 58 38 - 126 U/L   Total Bilirubin 0.5 0.3 - 1.2  mg/dL   GFR, Estimated >60 >60 mL/min   Anion gap 9 5 - 15  Troponin I (High Sensitivity)     Status: None   Collection Time: 05/10/22  8:49 PM  Result Value Ref Range   Troponin I (High Sensitivity) 4 <18 ng/L  Brain natriuretic peptide     Status: None   Collection  Time: 05/10/22  8:49 PM  Result Value Ref Range   B Natriuretic Peptide 63.9 0.0 - 100.0 pg/mL  Lactic acid, plasma     Status: None   Collection Time: 05/10/22  8:49 PM  Result Value Ref Range   Lactic Acid, Venous 1.2 0.5 - 1.9 mmol/L  Resp panel by RT-PCR (RSV, Flu A&B, Covid) Anterior Nasal Swab     Status: Abnormal   Collection Time: 05/10/22  9:12 PM   Specimen: Anterior Nasal Swab  Result Value Ref Range   SARS Coronavirus 2 by RT PCR NEGATIVE NEGATIVE   Influenza A by PCR NEGATIVE NEGATIVE   Influenza B by PCR NEGATIVE NEGATIVE   Resp Syncytial Virus by PCR POSITIVE (A) NEGATIVE  Blood culture (routine x 2)     Status: None (Preliminary result)   Collection Time: 05/10/22  9:28 PM   Specimen: BLOOD  Result Value Ref Range   Specimen Description      BLOOD BLOOD LEFT HAND Performed at [facility], [place] [address] [place], [place] [address]    Special Requests      BOTTLES DRAWN AEROBIC AND ANAEROBIC Blood Culture results may not be optimal due to an excessive volume of blood received in culture bottles Performed at [facility] [address] [place], [place] [address]    Culture      NO GROWTH < 12 HOURS Performed at [facility] [address], [place], [place] [address]    Report Status PENDING   Blood culture (routine x 2)     Status: None (Preliminary result)   Collection Time: 05/10/22  9:37 PM   Specimen: BLOOD  Result Value Ref Range   Specimen Description      BLOOD RIGHT ANTECUBITAL Performed at [facility] [address], [place], [place] [address]    Special Requests      BOTTLES DRAWN AEROBIC AND ANAEROBIC Blood  Culture adequate volume Performed at [facility] [address], [place], [place] [address]    Culture      NO GROWTH < 12 HOURS Performed at [facility] [address], [place], [place] [address]    Report Status PENDING   Lactic acid, plasma     Status: None   Collection Time: 05/10/22 11:41 PM  Result Value Ref Range   Lactic Acid, Venous 1.4 0.5 - 1.9 mmol/L  Troponin I (High Sensitivity)     Status: None   Collection Time: 05/10/22 11:43 PM  Result Value Ref Range   Troponin I (High Sensitivity) 4 <18 ng/L  HIV Antibody (routine testing w rflx)     Status: None   Collection Time: 05/11/22  5:00 AM  Result Value Ref Range   HIV Screen 4th Generation wRfx Non Reactive Non Reactive  CBC     Status: Abnormal   Collection Time: 05/11/22  5:00 AM  Result Value Ref Range   WBC 14.6 (H) 4.0 - 10.5 K/uL   RBC 4.37 4.22 - 5.81 MIL/uL   Hemoglobin 13.1 13.0 - 17.0 g/dL   HCT 38.9 (L) 39.0 - 52.0 %   MCV 89.0 80.0 - 100.0 fL   MCH 30.0 26.0 - 34.0 pg   MCHC 33.7 30.0 - 36.0 g/dL   RDW 13.2 11.5 - 15.5 %   Platelets 227 150 - 400 K/uL   nRBC 0.0 0.0 - 0.2 %  Basic metabolic panel     Status: Abnormal  Collection Time: 05/11/22  5:00 AM  Result Value Ref Range   Sodium 136 135 - 145 mmol/L   Potassium 3.7 3.5 - 5.1 mmol/L   Chloride 107 98 - 111 mmol/L   CO2 22 22 - 32 mmol/L   Glucose, Bld 149 (H) 70 - 99 mg/dL   BUN 22 8 - 23 mg/dL   Creatinine, Ser 0.89 0.61 - 1.24 mg/dL   Calcium 8.6 (L) 8.9 - 10.3 mg/dL   GFR, Estimated >60 >60 mL/min   Anion gap 7 5 - 15    I have Reviewed nursing notes, Vitals, and Lab results since pt's last encounter. Pertinent lab results : see above I have ordered test including BMP, CBC, Mg I have reviewed the last note from staff over past 24 hours I have discussed pt's care plan and test results with nursing staff, case manager  Time spent: Greater than 50% of the 55 minute visit was spent in  counseling/coordination of care for the patient as laid out in the A&P.    LOS: 0 days   Dwyane Dee, MD [facility] 05/11/2022, 12:05 PM

## 2022-05-11 NOTE — ED Notes (Signed)
Patient remains alert and oriented.  He denies any needs at this time.  He is breathing much better.

## 2022-05-11 NOTE — Assessment & Plan Note (Signed)
-   RSV positive on admission -Patient still too dyspneic and short of breath for discharging home especially with large rectus sheath hematoma from severe coughing -Was on 5 days of Levaquin outpatient prior to admission with no improvement, therefore will not continue remainder of course which is 2 more days -Continue oral steroid at discharge - Continue Tussionex and pain control given rectus hematoma

## 2022-05-11 NOTE — ED Notes (Addendum)
Pt ambulated around rm for several minutes. SPO2 maintained around 92% on RA. No c/o weakness or dizziness.

## 2022-05-12 ENCOUNTER — Encounter: Payer: Self-pay | Admitting: Internal Medicine

## 2022-05-12 DIAGNOSIS — J9601 Acute respiratory failure with hypoxia: Secondary | ICD-10-CM | POA: Diagnosis not present

## 2022-05-12 DIAGNOSIS — J205 Acute bronchitis due to respiratory syncytial virus: Secondary | ICD-10-CM | POA: Diagnosis not present

## 2022-05-12 MED ORDER — OXYCODONE HCL 5 MG PO TABS: 5.0000 mg | ORAL_TABLET | Freq: Four times a day (QID) | ORAL | 0 refills | Status: DC | PRN

## 2022-05-12 MED ORDER — PREDNISONE 20 MG PO TABS: 40.0000 mg | ORAL_TABLET | Freq: Every day | ORAL | 0 refills | Status: AC

## 2022-05-12 MED ORDER — HYDROCOD POLI-CHLORPHE POLI ER 10-8 MG/5ML PO SUER: 5.0000 mL | Freq: Two times a day (BID) | ORAL | 0 refills | Status: DC

## 2022-05-12 NOTE — Discharge Summary (Signed)
Physician Discharge Summary   Christopher Arias TWS:[id number] DOB: 1960-07-26 DOA: 05/10/2022  PCP: Biagio Borg, MD  Admit date: 05/10/2022 Discharge date: 05/12/2022 Barriers to discharge: none  Admitted From: Home Disposition:  Home Discharging physician: Dwyane Dee, MD  Recommendations for Outpatient Follow-up:  Wean O2 off  Home Health:  Equipment/Devices:   Discharge Condition: stable CODE STATUS: Full Diet recommendation:  Diet Orders (From admission, onward)     Start     Ordered   05/12/22 0000  Diet general        05/12/22 1013   05/10/22 2351  Diet regular Room service appropriate? Yes; Fluid consistency: Thin  Diet effective now       Question Answer Comment  Room service appropriate? Yes   Fluid consistency: Thin      05/10/22 2351            Hospital Course: Christopher Arias is a 61 y.o. male with medical history significant for COPD, HLD, hypothyroidism, depression/anxiety who is admitted with acute respiratory failure with hypoxia due to RSV bronchitis/reactive airway process. He had felt ill for approximately 1 week prior to hospitalization and had also been seen outpatient by pulmonology on 05/07/2022.  He was started on Levaquin, 7-day course and had 2 days left upon admission.  He did not feel any improvement after having started antibiotics. Due to excessive coughing and ongoing discomfort and malaise, he presented for further evaluation. He underwent workup with CXR, CTA chest and CT abdomen/pelvis. Negative PE, bibasilar bronchial wall thickening with areas of mucous plugging and lower lobe atelectasis appreciated.  Also found to have right rectus sheath hematoma in the right upper quadrant measuring 8.1 x 4 x 8.7 cm. He was admitted for pain control and breathing treatments.  Assessment and Plan: * Acute respiratory failure with hypoxia (HCC) Requiring new 2-4 L O2 via [place] to maintain SpO2 >92%.  Does not require supplemental O2 at baseline.  COVID  and flu negative.  CTA chest negative for PE, shows bibasilar bronchial wall thickening and lower lobe atelectasis - wean O2 as able and ambulate -Still requiring oxygen with ambulation and patient will be set up for home oxygen at discharge  Acute bronchitis due to respiratory syncytial virus (RSV) - RSV positive on admission -Patient still too dyspneic and short of breath for discharging home especially with large rectus sheath hematoma from severe coughing -Was on 5 days of Levaquin outpatient prior to admission with no improvement, therefore will not continue remainder of course which is 2 more days -Continue oral steroid at discharge - Continue Tussionex and pain control given rectus hematoma  COPD (chronic obstructive pulmonary disease) ([place]) - see RSV  Rectus sheath hematoma, initial encounter - Noted on CT imaging in the right upper quadrant measuring 8.1 x 4.0 x 8.7 cm.  Not on blood thinners as an outpatient other than aspirin.  Suspect developed secondary to frequent cough.  EDP discussed with on-call general surgery, no intervention necessary, anticipate this will self resolve.  Anxiety and depression Continue home Klonopin and sertraline.  Hyperlipidemia Continue atorvastatin.  Hypothyroidism Continue Synthroid.       The patient's chronic medical conditions were treated accordingly per the patient's home medication regimen except as noted.  On day of discharge, patient was felt deemed stable for discharge. Patient/family member advised to call PCP or come back to ER if needed.   Principal Diagnosis: Acute respiratory failure with hypoxia [facility])  Discharge Diagnoses: Active Hospital Problems  Diagnosis Date Noted   Acute respiratory failure with hypoxia ([place]) 05/10/2022    Priority: 1.   Acute bronchitis due to respiratory syncytial virus (RSV) 05/10/2022    Priority: 2.   COPD (chronic obstructive pulmonary disease) ([place]) 05/31/2021    Priority: 3.   Rectus  sheath hematoma, initial encounter 05/10/2022    Priority: 4.   Acute hypoxemic respiratory failure ([place]) 05/11/2022   Hypothyroidism 03/16/2010   Anxiety and depression 11/24/2007   Hyperlipidemia 11/24/2007    Resolved Hospital Problems  No resolved problems to display.     Discharge Instructions     Diet general   Complete by: As directed    Increase activity slowly   Complete by: As directed       Allergies as of 05/12/2022       Reactions   Atenolol Other (See Comments)   Nose bleeds        Medication List     STOP taking these medications    levofloxacin 500 MG tablet Commonly known as: LEVAQUIN       TAKE these medications    albuterol 108 (90 Base) MCG/ACT inhaler Commonly known as: VENTOLIN HFA Inhale 2 puffs into the lungs every 6 (six) hours as needed for wheezing or shortness of breath.   aspirin EC 81 MG tablet Take 1 tablet (81 mg total) by mouth daily. Swallow whole.   atorvastatin 20 MG tablet Commonly known as: LIPITOR Take 1 tablet (20 mg total) by mouth daily.   chlorpheniramine-HYDROcodone 10-8 MG/5ML Commonly known as: TUSSIONEX Take 5 mLs by mouth every 12 (twelve) hours.   clonazePAM 1 MG tablet Commonly known as: KLONOPIN Take 1 tablet (1 mg total) by mouth 2 (two) times daily as needed. What changed: reasons to take this   fluticasone-salmeterol 250-50 MCG/ACT Aepb Commonly known as: ADVAIR Inhale 1 puff into the lungs in the morning and at bedtime.   levothyroxine 100 MCG tablet Commonly known as: SYNTHROID Take 1 tablet (100 mcg total) by mouth daily.   oxyCODONE 5 MG immediate release tablet Commonly known as: Oxy IR/ROXICODONE Take 1 tablet (5 mg total) by mouth every 6 (six) hours as needed for moderate pain.   predniSONE 20 MG tablet Commonly known as: DELTASONE Take 2 tablets (40 mg total) by mouth daily for 5 days. What changed: when to take this   sertraline 100 MG tablet Commonly known as: ZOLOFT Take  2 tablets (200 mg total) by mouth daily.   triamcinolone 55 MCG/ACT Aero nasal inhaler Commonly known as: NASACORT Place 2 sprays into the nose daily. What changed:  when to take this reasons to take this   varenicline 1 MG tablet Commonly known as: Chantix Continuing Month Pak Take 1 tablet (1 mg total) by mouth 2 (two) times daily.               Durable Medical Equipment  (From admission, onward)           Start     Ordered   05/12/22 1050  For home use only DME oxygen  Once       Question Answer Comment  Length of Need 6 Months   Mode or (Route) Nasal cannula   Liters per Minute 2   Frequency Continuous (stationary and portable oxygen unit needed)   Oxygen conserving device Yes   Oxygen delivery system Gas      05/12/22 1049            Follow-up Information  [facility].   Contact information: [place] [address] [phone]                Allergies  Allergen Reactions   Atenolol Other (See Comments)    Nose bleeds    Consultations:   Procedures:   Discharge Exam: BP 118/76 (BP Location: Left Arm)   Pulse 83   Temp 98.2 F (36.8 C) (Oral)   Resp 18   Ht '5\' 5"'$  (1.651 m)   Wt 72.2 kg   SpO2 93%   BMI 26.49 kg/m  Physical Exam Constitutional:      Appearance: Normal appearance.     Comments: Fatigued appearing  HENT:     Head: Normocephalic and atraumatic.     Mouth/Throat:     Mouth: Mucous membranes are moist.  Eyes:     Extraocular Movements: Extraocular movements intact.  Cardiovascular:     Rate and Rhythm: Normal rate and regular rhythm.     Heart sounds: Normal heart sounds.  Pulmonary:     Effort: Pulmonary effort is normal.     Comments: Still coarse breath sounds throughout but improved wheezing Abdominal:     General: Bowel sounds are normal. There is no distension.     Palpations: Abdomen is soft.     Tenderness: There is no abdominal tenderness.   Musculoskeletal:        General: Normal range of motion.     Cervical back: Normal range of motion and neck supple.  Skin:    General: Skin is warm and dry.  Neurological:     General: No focal deficit present.     Mental Status: He is alert.  Psychiatric:        Mood and Affect: Mood normal.        Behavior: Behavior normal.      The results of significant diagnostics from this hospitalization (including imaging, microbiology, ancillary and laboratory) are listed below for reference.   Microbiology: Recent Results (from the past 240 hour(s))  Resp panel by RT-PCR (RSV, Flu A&B, Covid) Anterior Nasal Swab     Status: Abnormal   Collection Time: 05/10/22  9:12 PM   Specimen: Anterior Nasal Swab  Result Value Ref Range Status   SARS Coronavirus 2 by RT PCR NEGATIVE NEGATIVE Final    Comment: (NOTE) SARS-CoV-2 target nucleic acids are NOT DETECTED.  The SARS-CoV-2 RNA is generally detectable in upper respiratory specimens during the acute phase of infection. The lowest concentration of SARS-CoV-2 viral copies this assay can detect is 138 copies/mL. A negative result does not preclude SARS-Cov-2 infection and should not be used as the sole basis for treatment or other patient management decisions. A negative result may occur with  improper specimen collection/handling, submission of specimen other than nasopharyngeal swab, presence of viral mutation(s) within the areas targeted by this assay, and inadequate number of viral copies(<138 copies/mL). A negative result must be combined with clinical observations, patient history, and epidemiological information. The expected result is Negative.  Fact Sheet for Patients:  EntrepreneurPulse.com.au  Fact Sheet for Healthcare Providers:  IncredibleEmployment.be  This test is no t yet approved or cleared by the [place] FDA and  has been authorized for detection and/or diagnosis of SARS-CoV-2  by FDA under an Emergency Use Authorization (EUA). This EUA will remain  in effect (meaning this test can be used) for the duration of the COVID-19 declaration under Section 564(b)(1) of the Act, 21 U.S.C.section 360bbb-3(b)(1), unless  the authorization is terminated  or revoked sooner.       Influenza A by PCR NEGATIVE NEGATIVE Final   Influenza B by PCR NEGATIVE NEGATIVE Final    Comment: (NOTE) The Xpert Xpress SARS-CoV-2/FLU/RSV plus assay is intended as an aid in the diagnosis of influenza from Nasopharyngeal swab specimens and should not be used as a sole basis for treatment. Nasal washings and aspirates are unacceptable for Xpert Xpress SARS-CoV-2/FLU/RSV testing.  Fact Sheet for Patients: EntrepreneurPulse.com.au  Fact Sheet for Healthcare Providers: IncredibleEmployment.be  This test is not yet approved or cleared by the [place] FDA and has been authorized for detection and/or diagnosis of SARS-CoV-2 by FDA under an Emergency Use Authorization (EUA). This EUA will remain in effect (meaning this test can be used) for the duration of the COVID-19 declaration under Section 564(b)(1) of the Act, 21 U.S.C. section 360bbb-3(b)(1), unless the authorization is terminated or revoked.     Resp Syncytial Virus by PCR POSITIVE (A) NEGATIVE Final    Comment: (NOTE) Fact Sheet for Patients: EntrepreneurPulse.com.au  Fact Sheet for Healthcare Providers: IncredibleEmployment.be  This test is not yet approved or cleared by the [place] FDA and has been authorized for detection and/or diagnosis of SARS-CoV-2 by FDA under an Emergency Use Authorization (EUA). This EUA will remain in effect (meaning this test can be used) for the duration of the COVID-19 declaration under Section 564(b)(1) of the Act, 21 U.S.C. section 360bbb-3(b)(1), unless the authorization is terminated or revoked.  Performed at  [facility], [place] [address] [place], [place] [address]   Blood culture (routine x 2)     Status: None (Preliminary result)   Collection Time: 05/10/22  9:28 PM   Specimen: BLOOD  Result Value Ref Range Status   Specimen Description   Final    BLOOD BLOOD LEFT HAND Performed at [facility] [address] [place], [place] [address]    Special Requests   Final    BOTTLES DRAWN AEROBIC AND ANAEROBIC Blood Culture results may not be optimal due to an excessive volume of blood received in culture bottles Performed at [facility] [address] [place], [place] [address]    Culture   Final    NO GROWTH 1 DAY Performed at [facility], [place] [address], [place], [place] [address]    Report Status PENDING  Incomplete  Blood culture (routine x 2)     Status: None (Preliminary result)   Collection Time: 05/10/22  9:37 PM   Specimen: BLOOD  Result Value Ref Range Status   Specimen Description   Final    BLOOD RIGHT ANTECUBITAL Performed at [facility] [address] [place], [place] [address]    Special Requests   Final    BOTTLES DRAWN AEROBIC AND ANAEROBIC Blood Culture adequate volume Performed at [facility] [address] [place],  [address]    Culture   Final    NO GROWTH 1 DAY Performed at [facility], [place] [address], [place],  [address]    Report Status PENDING  Incomplete     Labs: BNP (last 3 results) Recent Labs    05/10/22 2049  BNP 46.2   Basic Metabolic Panel: Recent Labs  Lab 05/10/22 2049 05/11/22 0500  NA 139 136  K 3.5 3.7  CL 109 107  CO2 21* 22  GLUCOSE 120* 149*  BUN 23 22  CREATININE 0.93 0.89  CALCIUM 9.0 8.6*   Liver Function Tests: Recent Labs  Lab 05/10/22 2049  AST 15  ALT 19  ALKPHOS 58  BILITOT 0.5  PROT 6.4*  ALBUMIN 3.6   No results for input(s): "LIPASE", "AMYLASE" in the last 168 hours. No  results for input(s): "AMMONIA" in the last 168 hours. CBC: Recent Labs  Lab 05/10/22 2049 05/11/22 0500  WBC 11.6* 14.6*  NEUTROABS 9.5*  --   HGB 14.5 13.1  HCT 43.1 38.9*  MCV 89.8 89.0  PLT 224 227   Cardiac Enzymes: No results for input(s): "CKTOTAL", "CKMB", "CKMBINDEX", "TROPONINI" in the last 168 hours. BNP: Invalid input(s): "POCBNP" CBG: No results for input(s): "GLUCAP" in the last 168 hours. D-Dimer No results for input(s): "DDIMER" in the last 72 hours. Hgb A1c No results for input(s): "HGBA1C" in the last 72 hours. Lipid Profile No results for input(s): "CHOL", "HDL", "LDLCALC", "TRIG", "CHOLHDL", "LDLDIRECT" in the last 72 hours. Thyroid function studies No results for input(s): "TSH", "T4TOTAL", "T3FREE", "THYROIDAB" in the last 72 hours.  Invalid input(s): "FREET3" Anemia work up No results for input(s): "VITAMINB12", "FOLATE", "FERRITIN", "TIBC", "IRON", "RETICCTPCT" in the last 72 hours. Urinalysis    Component Value Date/Time   COLORURINE YELLOW 05/30/2021 1627   APPEARANCEUR CLEAR 05/30/2021 1627   LABSPEC 1.025 05/30/2021 1627   PHURINE 6.5 05/30/2021 1627   GLUCOSEU NEGATIVE 05/30/2021 1627   HGBUR NEGATIVE 05/30/2021 1627   BILIRUBINUR NEGATIVE 05/30/2021 1627   KETONESUR NEGATIVE 05/30/2021 1627   UROBILINOGEN 0.2 05/30/2021 1627   NITRITE NEGATIVE 05/30/2021 1627   LEUKOCYTESUR NEGATIVE 05/30/2021 1627   Sepsis Labs Recent Labs  Lab 05/10/22 2049 05/11/22 0500  WBC 11.6* 14.6*   Microbiology Recent Results (from the past 240 hour(s))  Resp panel by RT-PCR (RSV, Flu A&B, Covid) Anterior Nasal Swab     Status: Abnormal   Collection Time: 05/10/22  9:12 PM   Specimen: Anterior Nasal Swab  Result Value Ref Range Status   SARS Coronavirus 2 by RT PCR NEGATIVE NEGATIVE Final    Comment: (NOTE) SARS-CoV-2 target nucleic acids are NOT DETECTED.  The SARS-CoV-2 RNA is generally detectable in upper respiratory specimens during the acute  phase of infection. The lowest concentration of SARS-CoV-2 viral copies this assay can detect is 138 copies/mL. A negative result does not preclude SARS-Cov-2 infection and should not be used as the sole basis for treatment or other patient management decisions. A negative result may occur with  improper specimen collection/handling, submission of specimen other than nasopharyngeal swab, presence of viral mutation(s) within the areas targeted by this assay, and inadequate number of viral copies(<138 copies/mL). A negative result must be combined with clinical observations, patient history, and epidemiological information. The expected result is Negative.  Fact Sheet for Patients:  EntrepreneurPulse.com.au  Fact Sheet for Healthcare Providers:  IncredibleEmployment.be  This test is no t yet approved or cleared by the [place] FDA and  has been authorized for detection and/or diagnosis of SARS-CoV-2 by FDA under an Emergency Use Authorization (EUA). This EUA will remain  in effect (meaning this test can be used) for the duration of the COVID-19 declaration under Section 564(b)(1) of the Act, 21 U.S.C.section 360bbb-3(b)(1), unless the authorization is terminated  or revoked sooner.       Influenza A by PCR NEGATIVE NEGATIVE Final   Influenza B by PCR NEGATIVE NEGATIVE Final    Comment: (NOTE) The Xpert Xpress SARS-CoV-2/FLU/RSV plus assay is intended as an aid in the diagnosis of  influenza from Nasopharyngeal swab specimens and should not be used as a sole basis for treatment. Nasal washings and aspirates are unacceptable for Xpert Xpress SARS-CoV-2/FLU/RSV testing.  Fact Sheet for Patients: EntrepreneurPulse.com.au  Fact Sheet for Healthcare Providers: IncredibleEmployment.be  This test is not yet approved or cleared by the [place] FDA and has been authorized for detection and/or diagnosis of  SARS-CoV-2 by FDA under an Emergency Use Authorization (EUA). This EUA will remain in effect (meaning this test can be used) for the duration of the COVID-19 declaration under Section 564(b)(1) of the Act, 21 U.S.C. section 360bbb-3(b)(1), unless the authorization is terminated or revoked.     Resp Syncytial Virus by PCR POSITIVE (A) NEGATIVE Final    Comment: (NOTE) Fact Sheet for Patients: EntrepreneurPulse.com.au  Fact Sheet for Healthcare Providers: IncredibleEmployment.be  This test is not yet approved or cleared by the [place] FDA and has been authorized for detection and/or diagnosis of SARS-CoV-2 by FDA under an Emergency Use Authorization (EUA). This EUA will remain in effect (meaning this test can be used) for the duration of the COVID-19 declaration under Section 564(b)(1) of the Act, 21 U.S.C. section 360bbb-3(b)(1), unless the authorization is terminated or revoked.  Performed at [facility], [place] [address], [place], [place] [address]   Blood culture (routine x 2)     Status: None (Preliminary result)   Collection Time: 05/10/22  9:28 PM   Specimen: BLOOD  Result Value Ref Range Status   Specimen Description   Final    BLOOD BLOOD LEFT HAND Performed at [facility] [address] [place], [place] [address]    Special Requests   Final    BOTTLES DRAWN AEROBIC AND ANAEROBIC Blood Culture results may not be optimal due to an excessive volume of blood received in culture bottles Performed at [facility] [address], [place], [place] [address]    Culture   Final    NO GROWTH 1 DAY Performed at [facility], [place] [address] [place], [place] [address]    Report Status PENDING  Incomplete  Blood culture (routine x 2)     Status: None (Preliminary result)   Collection Time: 05/10/22  9:37 PM   Specimen: BLOOD  Result Value Ref Range Status   Specimen  Description   Final    BLOOD RIGHT ANTECUBITAL Performed at [facility] [address] [place], [place] [address]    Special Requests   Final    BOTTLES DRAWN AEROBIC AND ANAEROBIC Blood Culture adequate volume Performed at [facility] [address], [place], [place] [address]    Culture   Final    NO GROWTH 1 DAY Performed at [facility], [place] [address] [place], [place] [address]    Report Status PENDING  Incomplete    Procedures/Studies: CT ABDOMEN PELVIS WO CONTRAST  Result Date: 05/10/2022 CLINICAL DATA:  Abdominal wall hematoma. EXAM: CT ABDOMEN AND PELVIS WITHOUT CONTRAST TECHNIQUE: Multidetector CT imaging of the abdomen and pelvis was performed following the standard protocol without IV contrast. RADIATION DOSE REDUCTION: This exam was performed according to the departmental dose-optimization program which includes automated exposure control, adjustment of the mA and/or kV according to patient size and/or use of iterative reconstruction technique. COMPARISON:  05/10/2022. FINDINGS: Lower chest: Heart is normal in size. A few scattered coronary artery calcifications are noted. Emphysematous changes are noted in the lungs. There is patchy airspace disease in  the lower lobes bilaterally. Hepatobiliary: No focal liver abnormality is seen. No gallstones, gallbladder wall thickening, or biliary dilatation. Pancreas: Unremarkable. No pancreatic ductal dilatation or surrounding inflammatory changes. Spleen: Normal in size without focal abnormality. Adrenals/Urinary Tract: The adrenal glands are within normal limits. There is a cyst in the mid right kidney. Excreted contrast is noted at the renal pyramids and collecting systems bilaterally. No hydroureteronephrosis. Bladder is unremarkable. Stomach/Bowel: Stomach is within normal limits. Appendix appears normal. No evidence of bowel wall thickening, distention, or inflammatory changes. No free air  or pneumatosis. Vascular/Lymphatic: No significant vascular findings are present. There is a nodular density in the perirectal space on the right measuring 1.6 cm. There is a nodular density adjacent to the prostate gland on the left posteriorly measuring 1.1 cm. There is a nodular density in the perirectal space on the left measuring 9 mm Reproductive: Prostate gland is enlarged. Other: No abdominopelvic ascites. Small fat containing umbilical hernia. Musculoskeletal: There is a hematoma in the rectus sheath in the upper chest and abdomen on the right measuring 8.1 x 4.0 x 8.7 cm with surrounding fat stranding. Degenerative changes in the thoracic spine. No acute osseous abnormality. IMPRESSION: 1. Right rectus sheath hematoma in the right upper quadrant measuring 8.1 x 4.0 x 8.7 cm. 2. Patchy infiltrates in the lower lobes bilaterally. 3. Emphysema. 4. Nodular densities in the perirectal space bilaterally and adjacent to the prostate gland on the left, possible lymphadenopathy. Clinical correlation is recommended. 5. Enlarged prostate gland. 6. Coronary artery calcifications. 7. Remaining findings as described above. Electronically Signed   By: Brett Fairy M.D.   On: 05/10/2022 23:18   CT Angio Chest PE W and/or Wo Contrast  Result Date: 05/10/2022 CLINICAL DATA:  Pleuritic chest pain, short of breath, recent abnormal chest x-ray EXAM: CT ANGIOGRAPHY CHEST WITH CONTRAST TECHNIQUE: Multidetector CT imaging of the chest was performed using the standard protocol during bolus administration of intravenous contrast. Multiplanar CT image reconstructions and MIPs were obtained to evaluate the vascular anatomy. RADIATION DOSE REDUCTION: This exam was performed according to the departmental dose-optimization program which includes automated exposure control, adjustment of the mA and/or kV according to patient size and/or use of iterative reconstruction technique. CONTRAST:  59m OMNIPAQUE IOHEXOL 350 MG/ML SOLN  COMPARISON:  05/05/2022, 05/10/2022 FINDINGS: Cardiovascular: This is a technically adequate evaluation of the pulmonary vasculature. No filling defects or pulmonary emboli. Dilated main pulmonary arteries consistent with pulmonary arterial hypertension. The heart is unremarkable without pericardial effusion. No evidence of thoracic aortic aneurysm or dissection. Mediastinum/Nodes: No enlarged mediastinal, hilar, or axillary lymph nodes. Thyroid gland, trachea, and esophagus demonstrate no significant findings. Lungs/Pleura: Upper lobe predominant emphysema. There is bibasilar bronchial wall thickening, with areas of mucous plugging and scattered consolidation within the lower lobes. No effusion or pneumothorax. Upper Abdomen: There is abnormal density within the visualized portion of the right rectus musculature, which may reflect rectus sheath hematoma. CT abdomen and pelvis is recommended for complete visualization. Musculoskeletal: No acute or destructive bony lesions. Reconstructed images demonstrate no additional findings. Review of the MIP images confirms the above findings. IMPRESSION: 1. No evidence of pulmonary embolus. 2. Bibasilar bronchial wall thickening, with areas of mucous plugging and likely lower lobe atelectasis as above. Findings consistent with bronchitis or reactive airway disease. 3. Incomplete visualization of right rectus sheath hematoma. Dedicated CT of the abdomen and pelvis is recommended. 4. Aortic Atherosclerosis (ICD10-I70.0) and Emphysema (ICD10-J43.9). Critical Value/emergent results were called by telephone at the time of  interpretation on 05/10/2022 at 10:28 pm to provider [facility] , who verbally acknowledged these results. Electronically Signed   By: Randa Ngo M.D.   On: 05/10/2022 22:34   DG Chest Portable 1 View  Result Date: 05/10/2022 CLINICAL DATA:  Shortness of breath and cough EXAM: PORTABLE CHEST 1 VIEW COMPARISON:  05/05/2022 FINDINGS: Cardiac shadow  is within normal limits. The lungs are well aerated bilaterally. Minimal scarring is noted in the bases. No focal confluent infiltrate is seen. No bony abnormality is noted. IMPRESSION: No active disease. Electronically Signed   By: Inez Catalina M.D.   On: 05/10/2022 20:50   DG Chest 2 View  Result Date: 05/05/2022 CLINICAL DATA:  Shortness of breath.  COPD. EXAM: CHEST - 2 VIEW COMPARISON:  May 30, 2021 FINDINGS: A nodular density measuring up to 1.4 cm projects over the right mid lung, a new finding. Probable small effusions. Bibasilar opacities favored represent atelectasis. Hyperinflation of the lungs. No pneumothorax. No change in the cardiomediastinal silhouette. IMPRESSION: 1. A 1.4 cm nodular density projects over the right mid lung, a new finding. Recommend a CT scan of the chest for better evaluation. 2. Probable small effusions and bibasilar atelectasis. 3. Hyperinflation of the lungs consistent with emphysema or COPD. 4. No other acute abnormalities. Electronically Signed   By: Dorise Bullion III M.D.   On: 05/05/2022 15:30     Time coordinating discharge: Over 30 minutes    Dwyane Dee, MD  [facility] 05/12/2022, 12:07 PM

## 2022-05-12 NOTE — Progress Notes (Signed)
SATURATION QUALIFICATIONS: (This note is used to comply with regulatory documentation for home oxygen)  Patient Saturations on Room Air at Rest = 90%  Patient Saturations on Room Air while Ambulating = 88%  Patient Saturations on 2 Liters of oxygen while Ambulating = 95%  Please briefly explain why patient needs home oxygen:  low oxygen saturation while ambulating

## 2022-05-12 NOTE — TOC Initial Note (Addendum)
Transition of Care [facility]) - Initial/Assessment Note    Patient Details  Name: Christopher Arias MRN: [id number] Date of Birth: 1961/01/06  Transition of Care [facility]) CM/SW Contact:    Henrietta Dine, RN Phone Number: [phone] 05/12/2022, 11:36 AM  Clinical Narrative:                 [facility] consult for home oxygen; spoke w/ patient and he agrees with services; the pt is from home w/ his spouse and he says he will return at d/c; he has transportation; the pt says he does not have dentures, or hearing aids; the pt says he wears glasses; he also says he does not have DME or [place] services; spoke w/ Jeneen Rinks at [facility] and they can provide services for the pt; pt notified and contact information for [employer] placed in follow up provider section of d/c instructions; no TOC needs.        Patient Goals and CMS Choice        Expected Discharge Plan and Services           Expected Discharge Date: 05/12/22                                    Prior Living Arrangements/Services                       Activities of Daily Living Home Assistive Devices/Equipment: None ADL Screening (condition at time of admission) Patient's cognitive ability adequate to safely complete daily activities?: Yes Is the patient deaf or have difficulty hearing?: No Does the patient have difficulty seeing, even when wearing glasses/contacts?: No Does the patient have difficulty concentrating, remembering, or making decisions?: No Patient able to express need for assistance with ADLs?: Yes Does the patient have difficulty dressing or bathing?: No Independently performs ADLs?: Yes (appropriate for developmental age) Does the patient have difficulty walking or climbing stairs?: No Weakness of Legs: None Weakness of Arms/Hands: None  Permission Sought/Granted                  Emotional Assessment              Admission diagnosis:  Shortness of breath [R06.02] Acute respiratory failure  with hypoxia ([place]) [J96.01] RSV infection [B33.8] Hematoma of rectus sheath, initial encounter [S30.1XXA] Acute hypoxemic respiratory failure ([place]) [J96.01] Patient Active Problem List   Diagnosis Date Noted   Acute hypoxemic respiratory failure ([place]) 05/11/2022   Acute respiratory failure with hypoxia ([place]) 05/10/2022   Acute bronchitis due to respiratory syncytial virus (RSV) 05/10/2022   Rectus sheath hematoma, initial encounter 05/10/2022   Moderate obstructive sleep apnea 11/26/2021   History of tobacco use 11/26/2021   COPD (chronic obstructive pulmonary disease) ([place]) 05/31/2021   Fatigue 05/30/2021   Memory change 05/30/2021   Abnormal breathing 05/30/2021   Rash 11/27/2020   COVID-19 virus infection 06/20/2020   Infected sebaceous cyst 11/19/2019   Vitamin D deficiency 09/08/2019   Mild persistent asthma with exacerbation 09/08/2019   Hyperglycemia 03/08/2019   Allergic rhinitis 03/08/2019   Grade IV hemorrhoids 04/08/2018   Hemorrhoids 03/24/2018   External hemorrhoid, thrombosed 01/14/2018   Insomnia 08/28/2016   Cough 06/30/2015   Inappropriate sinus tachycardia 02/03/2015   Smoker 03/12/2012   Hidradenitis 12/19/2010   Encounter for well adult exam with abnormal findings 11/13/2010   Special screening for malignant neoplasm of prostate 08/23/2010  Hypothyroidism 03/16/2010   ANTERIOR PITUITARY HYPERFUNCTION 11/23/2009   GYNECOMASTIA 11/10/2009   Hyperlipidemia 11/24/2007   Anxiety and depression 11/24/2007   GERD 11/24/2007   PCP:  Biagio Borg, MD Pharmacy:   [id number] [place] [place]Phone: [phone]: [phone] [id number] [address]: [phone]: [phone]   Social Determinants of Health (SDOH) Interventions Utilities Interventions:  Intervention Not Indicated  Readmission Risk Interventions     No data to display

## 2022-05-12 NOTE — TOC Initial Note (Deleted)
Transition of Care (TOC) - Initial/Assessment Note    Transition of Care [facility]) Screening Note   Patient Details  Name: Christopher Arias Date of Birth: [birth year]   [facility] of [facility]) CM/SW Contact:    Henrietta Dine, RN Phone Number: [phone] 05/12/2022, 10:02 AM    Transition of Care Department (TOC) has reviewed patient and no TOC needs have been identified at this time. We will continue to monitor patient advancement through interdisciplinary progression rounds. If new patient transition needs arise, please place a TOC consult.   Patient Details  Name: Christopher Arias MRN: [id number] Date of Birth: 08-16-1960  Transition of Care [facility]) CM/SW Contact:    Henrietta Dine, RN Phone Number: 05/12/2022, 10:02 AM  Clinical Narrative:                         Patient Goals and CMS Choice        Expected Discharge Plan and Services                                                Prior Living Arrangements/Services                       Activities of Daily Living Home Assistive Devices/Equipment: None ADL Screening (condition at time of admission) Patient's cognitive ability adequate to safely complete daily activities?: Yes Is the patient deaf or have difficulty hearing?: No Does the patient have difficulty seeing, even when wearing glasses/contacts?: No Does the patient have difficulty concentrating, remembering, or making decisions?: No Patient able to express need for assistance with ADLs?: Yes Does the patient have difficulty dressing or bathing?: No Independently performs ADLs?: Yes (appropriate for developmental age) Does the patient have difficulty walking or climbing stairs?: No Weakness of Legs: None Weakness of Arms/Hands: None  Permission Sought/Granted                  Emotional Assessment              Admission diagnosis:  Shortness of breath [R06.02] Acute respiratory failure with hypoxia  ([place]) [J96.01] RSV infection [B33.8] Hematoma of rectus sheath, initial encounter [S30.1XXA] Acute hypoxemic respiratory failure ([place]) [J96.01] Patient Active Problem List   Diagnosis Date Noted   Acute hypoxemic respiratory failure ([place]) 05/11/2022   Acute respiratory failure with hypoxia ([place]) 05/10/2022   Acute bronchitis due to respiratory syncytial virus (RSV) 05/10/2022   Rectus sheath hematoma, initial encounter 05/10/2022   Moderate obstructive sleep apnea 11/26/2021   History of tobacco use 11/26/2021   COPD (chronic obstructive pulmonary disease) ([place]) 05/31/2021   Fatigue 05/30/2021   Memory change 05/30/2021   Abnormal breathing 05/30/2021   Rash 11/27/2020   COVID-19 virus infection 06/20/2020   Infected sebaceous cyst 11/19/2019   Vitamin D deficiency 09/08/2019   Mild persistent asthma with exacerbation 09/08/2019   Hyperglycemia 03/08/2019   Allergic rhinitis 03/08/2019   Grade IV hemorrhoids 04/08/2018   Hemorrhoids 03/24/2018   External hemorrhoid, thrombosed 01/14/2018   Insomnia 08/28/2016   Cough 06/30/2015   Inappropriate sinus tachycardia 02/03/2015   Smoker 03/12/2012   Hidradenitis 12/19/2010   Encounter for well adult exam with abnormal findings 11/13/2010   Special screening for malignant neoplasm of prostate 08/23/2010   Hypothyroidism 03/16/2010   ANTERIOR PITUITARY HYPERFUNCTION 11/23/2009  GYNECOMASTIA 11/10/2009   Hyperlipidemia 11/24/2007   Anxiety and depression 11/24/2007   GERD 11/24/2007   PCP:  Biagio Borg, MD Pharmacy:   [id number] [place]Phone: [phone]: [phone] [id number] [address]: [phone]: [phone]   Social Determinants of Health (SDOH) Interventions Utilities Interventions: Intervention Not  Indicated  Readmission Risk Interventions     No data to display

## 2022-05-12 NOTE — Progress Notes (Signed)
  Transition of Care [facility]) Screening Note   Patient Details  Name: Christopher Arias Date of Birth: Sep 23, 1960   Transition [facility] Care [facility]) CM/SW Contact:    Henrietta Dine, RN Phone Number: 05/12/2022, 10:14 AM    Transition of Care Department [facility]) has reviewed patient and no TOC needs have been identified at this time. We will continue to monitor patient advancement through interdisciplinary progression rounds. If new patient transition needs arise, please place a TOC consult.

## 2022-05-12 NOTE — Progress Notes (Signed)
Oxygen tank delivered to room. Patient to discharge home via private vehicle.

## 2022-05-13 ENCOUNTER — Telehealth: Payer: Self-pay | Admitting: Pulmonary Disease

## 2022-05-13 ENCOUNTER — Telehealth: Payer: Self-pay | Admitting: Internal Medicine

## 2022-05-13 DIAGNOSIS — J210 Acute bronchiolitis due to respiratory syncytial virus: Secondary | ICD-10-CM | POA: Diagnosis not present

## 2022-05-13 DIAGNOSIS — J449 Chronic obstructive pulmonary disease, unspecified: Secondary | ICD-10-CM | POA: Diagnosis not present

## 2022-05-13 NOTE — Telephone Encounter (Signed)
Patient called wanting some advice on whether he needs to schedule an appt with Dr. Jenny Reichmann or with a pulmonologist. Patient stated that he just got out the hospital with having RSV. Patient is not sure what is the next step to take so he wants to wait before scheduling an appt with Dr. Jenny Reichmann.

## 2022-05-13 NOTE — Assessment & Plan Note (Signed)
Clinical symptoms and CXR findings are consistent with pneumonia We need to repeat imaging of your lungs with CT scan after completing antibiotic to ensure resolution of density, if not better may need to discuss further diagnostic options   Recommendations: Take mucinex-dm '1200mg'$  twice daily x 7-10 days Use flutter valve three times a day x 7-10 days (you can purchase acapella/flutter device on [employer]- see hand out)  Continue Advair morning and evening Use albuterol 2 puffs every 4-6 hours for breakthrough shortness of breath/wheezing Complete prednisone as prescribed   Orders: Ambulatory walk test on room air  CT chest in 4 weeks  (ordered) Please provide patient with letter to return to work with light duty on Monday 05/13/22   Rx: Levaquin '500mg'$  daily x 7 days   Follow-up: 4 week follow-up with Eustaquio Maize NP after CT chest

## 2022-05-13 NOTE — Telephone Encounter (Signed)
I originally only saw him for obstructive sleep apnea.  I have not assessed him for his current issues that were found during his recent hospitalization.  I would not be able to determine how long he needs to stay out of work without assessing him in he office first.  Is it possible to get him scheduled with an NP sooner.  If not, then I can assess his status on 06/04/22.

## 2022-05-13 NOTE — Telephone Encounter (Signed)
Spoke with pt who states states he was placed on O2 in most recent hospital stay. Pt is concerned on how to start short term disability. Pt was instructed to reach out to his HR department to start process. Pt was scheduled with 1st available OV on 1/2/2. Pt is asking if he should stay out of work until then. Dr. Halford Chessman please advise.

## 2022-05-14 ENCOUNTER — Encounter: Payer: Self-pay | Admitting: *Deleted

## 2022-05-14 ENCOUNTER — Telehealth: Payer: Self-pay | Admitting: *Deleted

## 2022-05-14 ENCOUNTER — Ambulatory Visit: Payer: BC Managed Care – PPO | Admitting: Primary Care

## 2022-05-14 ENCOUNTER — Encounter: Payer: Self-pay | Admitting: Primary Care

## 2022-05-14 VITALS — BP 112/68 | HR 107 | Temp 98.2°F | Ht 64.0 in | Wt 165.5 lb

## 2022-05-14 DIAGNOSIS — J9601 Acute respiratory failure with hypoxia: Secondary | ICD-10-CM

## 2022-05-14 DIAGNOSIS — J449 Chronic obstructive pulmonary disease, unspecified: Secondary | ICD-10-CM

## 2022-05-14 DIAGNOSIS — G4733 Obstructive sleep apnea (adult) (pediatric): Secondary | ICD-10-CM

## 2022-05-14 DIAGNOSIS — J205 Acute bronchitis due to respiratory syncytial virus: Secondary | ICD-10-CM

## 2022-05-14 DIAGNOSIS — S301XXA Contusion of abdominal wall, initial encounter: Secondary | ICD-10-CM

## 2022-05-14 NOTE — Telephone Encounter (Signed)
Patient declined to schedule follow up with Dr.John as he is scheduled to see pulmonology for a hospital follow up for RSV. Depending on how the appt goes, he will call back to schedule a follow up.

## 2022-05-14 NOTE — Assessment & Plan Note (Addendum)
-   NEW; O2 started in December 2023 during admission for acute bronchitis secondary to RSV. CTA was negative for PE. SpO2 92-97% RA after walking 250 ft, needed to stop d/t fatigue. Advised he continue to wear 2L supplemental O2 with moderate-heavy exertion and at bedtime. We will recheck ambulatory walk test at follow-up with goal to discontinue daytime oxygen.

## 2022-05-14 NOTE — Assessment & Plan Note (Signed)
-   Secondary to cough. Not on blood thinner. No intervention recommended, expected to self resolve. Currently on oxycodone for pain control- we will provide refill, advised to use sparingly and taper frequency to q8 and alternate with tylenol.

## 2022-05-14 NOTE — Progress Notes (Signed)
_0  ID: Christopher Arias, male    DOB: Jun 19, 1960, 61 y.o.   MRN: [id number]  Chief Complaint  Patient presents with   [facility] f/u RSV 05/10/2022 Breathing has been ok  02 2-3 L    Referring provider: Biagio Borg, MD  HPI: 61 year old male, former smoker. PMH significant for OSA, COPD, GERD, hypothyroidism, hyperlipidemia, rectus sheath hematoma.  Previous [facility] pulmonary encounter: 05/07/2022 Patient presents today for acute overview.  Patient was seen at urgent care 2 days ago for shortness of breath and cough.  Chest x-ray without acute findings, possible new nodule right middle lung recommending CT chest follow-up.  Diagnosed with COPD exacerbation and given prednisone 40 mg daily x 5 days.  Advised to follow-up with pulmonary office.  Cough started 5 days ago. He has some mucus production with clear sputum  Associated shortness of breath and wheezing  CXR showed nodular density 1.4cm right mid lung, which is new. Small effusion and attelectaiss.  He travels for work and needs a note to return   05/14/2022 Patient presents today for hospital follow-up/pneumonia He was seen on 05/07/22, given Levaquin. Walked in office and did not require supplement oxygen. He was hospitalized from 12/8-12/10 for acute respiratory failure with hypoxia and acute bronchitis due to RSV.  He developed severe right sided pain on Friday 12/8. COVID and flu were negative.  RSV positive on admission. CTA negative for PE, bibasilar bronchial wall thickening and lower lobe atelectasis.  Patient with new oxygen requirements 2 to 4 L to maintain O2 greater than 92%.  Patient had incidental findings of a large rectus sheath hematoma in the right upper quadrant from severe coughing, no intervention necessary and expected to self resolve.  Patient was discharged on steroids, oxygen, tussionex and oxycodone for pain control given rectus hematoma.  He is doing alright today. He does not cough when  wearing oxygen. Cough is dry. Right sided pain with cough 10/10. At rest pain is a 0/10. He is taking oxycodone every 6 hours for pain control and Tussionex at bedtime. He is on prednisone until Friday. Advair was switched to [facility] during hospitalization. Needs updated PFTs. He is applying for short term disability, plans on retiring soon.    Allergies  Allergen Reactions   Atenolol Other (See Comments)    Nose bleeds    Immunization History  Administered Date(s) Administered   H1N1 07/19/2008   Influenza,inj,Quad PF,6+ Mos 03/12/2013, 06/17/2014, 03/08/2019, 05/30/2021   PFIZER(Purple Top)SARS-COV-2 Vaccination 08/30/2019, 09/20/2019   Td 07/19/2008   Tdap 09/13/2012   Zoster Recombinat (Shingrix) 11/27/2020, 01/31/2021    Past Medical History:  Diagnosis Date   ANTERIOR PITUITARY HYPERFUNCTION 11/23/2009   ANXIETY 11/24/2007   COPD (chronic obstructive pulmonary disease) ([place]) 05/31/2021   GERD 11/24/2007   GYNECOMASTIA 11/10/2009   HYPERLIPIDEMIA 11/24/2007   HYPOTHYROIDISM, POST-RADIATION 03/16/2010    Tobacco History: Social History   Tobacco Use  Smoking Status Former   Packs/day: 1.00   Years: 20.00   Total pack years: 20.00   Types: Cigarettes   Quit date: 08/24/2021   Years since quitting: 0.7  Smokeless Tobacco Never   Counseling given: Not Answered   Outpatient Medications Prior to Visit  Medication Sig Dispense Refill   albuterol (VENTOLIN HFA) 108 (90 Base) MCG/ACT inhaler Inhale 2 puffs into the lungs every 6 (six) hours as needed for wheezing or shortness of breath. 18 g 3   atorvastatin (LIPITOR) 20 MG tablet Take 1  tablet (20 mg total) by mouth daily. 90 tablet 3   chlorpheniramine-HYDROcodone (TUSSIONEX) 10-8 MG/5ML Take 5 mLs by mouth every 12 (twelve) hours. 70 mL 0   clonazePAM (KLONOPIN) 1 MG tablet Take 1 tablet (1 mg total) by mouth 2 (two) times daily as needed. (Patient taking differently: Take 1 mg by mouth 2 (two) times daily as needed for  anxiety.) 60 tablet 2   fluticasone-salmeterol (ADVAIR) 250-50 MCG/ACT AEPB Inhale 1 puff into the lungs in the morning and at bedtime. 3 each 3   levothyroxine (SYNTHROID) 100 MCG tablet Take 1 tablet (100 mcg total) by mouth daily. 90 tablet 3   mometasone-formoterol (DULERA) 100-5 MCG/ACT AERO Inhale 2 puffs into the lungs 2 (two) times daily.     oxyCODONE (OXY IR/ROXICODONE) 5 MG immediate release tablet Take 1 tablet (5 mg total) by mouth every 6 (six) hours as needed for moderate pain. 20 tablet 0   predniSONE (DELTASONE) 20 MG tablet Take 2 tablets (40 mg total) by mouth daily for 5 days. 10 tablet 0   sertraline (ZOLOFT) 100 MG tablet Take 2 tablets (200 mg total) by mouth daily. 180 tablet 3   triamcinolone (NASACORT) 55 MCG/ACT AERO nasal inhaler Place 2 sprays into the nose daily. (Patient taking differently: Place 2 sprays into the nose daily as needed (allergies).) 1 each 12   varenicline (CHANTIX CONTINUING MONTH PAK) 1 MG tablet Take 1 tablet (1 mg total) by mouth 2 (two) times daily. 60 tablet 2   aspirin 81 MG EC tablet Take 1 tablet (81 mg total) by mouth daily. Swallow whole. (Patient not taking: Reported on 05/14/2022) 30 tablet 12   No facility-administered medications prior to visit.    Review of Systems  Review of Systems  Constitutional: Negative.   HENT: Negative.    Respiratory:  Positive for cough. Negative for chest tightness, shortness of breath and wheezing.   Cardiovascular: Negative.   Gastrointestinal:  Positive for abdominal pain.   Physical Exam  BP 112/68 (BP Location: Left Arm, Patient Position: Sitting, Cuff Size: Normal)   Pulse (!) 107   Temp 98.2 F (36.8 C) (Oral)   Ht _0  (1.626 m)   Wt 165 lb 8 oz (75.1 kg)   SpO2 92%   BMI 28.41 kg/m  Physical Exam Constitutional:      Appearance: Normal appearance.  HENT:     Head: Normocephalic and atraumatic.     Mouth/Throat:     Mouth: Mucous membranes are moist.     Pharynx: Oropharynx is  clear.  Cardiovascular:     Rate and Rhythm: Normal rate.  Pulmonary:     Effort: Pulmonary effort is normal.     Breath sounds: No wheezing, rhonchi or rales.  Musculoskeletal:        General: Normal range of motion.  Skin:    Comments: Large right abdominal hematoma   Neurological:     General: No focal deficit present.     Mental Status: He is alert and oriented to person, place, and time. Mental status is at baseline.      Lab Results:  CBC    Component Value Date/Time   WBC 14.6 (H) 05/11/2022 0500   RBC 4.37 05/11/2022 0500   HGB 13.1 05/11/2022 0500   HCT 38.9 (L) 05/11/2022 0500   PLT 227 05/11/2022 0500   MCV 89.0 05/11/2022 0500   MCH 30.0 05/11/2022 0500   MCHC 33.7 05/11/2022 0500   RDW 13.2 05/11/2022 0500  LYMPHSABS 1.1 05/10/2022 2049   MONOABS 0.8 05/10/2022 2049   EOSABS 0.0 05/10/2022 2049   BASOSABS 0.0 05/10/2022 2049    BMET    Component Value Date/Time   NA 136 05/11/2022 0500   K 3.7 05/11/2022 0500   CL 107 05/11/2022 0500   CO2 22 05/11/2022 0500   GLUCOSE 149 (H) 05/11/2022 0500   BUN 22 05/11/2022 0500   CREATININE 0.89 05/11/2022 0500   CALCIUM 8.6 (L) 05/11/2022 0500   GFRNONAA >60 05/11/2022 0500   GFRAA  07/25/2010 0627    >60        The eGFR has been calculated using the MDRD equation. This calculation has not been validated in all clinical situations. eGFR's persistently <60 mL/min signify possible Chronic Kidney Disease.    BNP    Component Value Date/Time   BNP 63.9 05/10/2022 2049    ProBNP    Component Value Date/Time   PROBNP <30.0 07/25/2010 2979    Imaging: CT ABDOMEN PELVIS WO CONTRAST  Result Date: 05/10/2022 CLINICAL DATA:  Abdominal wall hematoma. EXAM: CT ABDOMEN AND PELVIS WITHOUT CONTRAST TECHNIQUE: Multidetector CT imaging of the abdomen and pelvis was performed following the standard protocol without IV contrast. RADIATION DOSE REDUCTION: This exam was performed according to the departmental  dose-optimization program which includes automated exposure control, adjustment of the mA and/or kV according to patient size and/or use of iterative reconstruction technique. COMPARISON:  05/10/2022. FINDINGS: Lower chest: Heart is normal in size. A few scattered coronary artery calcifications are noted. Emphysematous changes are noted in the lungs. There is patchy airspace disease in the lower lobes bilaterally. Hepatobiliary: No focal liver abnormality is seen. No gallstones, gallbladder wall thickening, or biliary dilatation. Pancreas: Unremarkable. No pancreatic ductal dilatation or surrounding inflammatory changes. Spleen: Normal in size without focal abnormality. Adrenals/Urinary Tract: The adrenal glands are within normal limits. There is a cyst in the mid right kidney. Excreted contrast is noted at the renal pyramids and collecting systems bilaterally. No hydroureteronephrosis. Bladder is unremarkable. Stomach/Bowel: Stomach is within normal limits. Appendix appears normal. No evidence of bowel wall thickening, distention, or inflammatory changes. No free air or pneumatosis. Vascular/Lymphatic: No significant vascular findings are present. There is a nodular density in the perirectal space on the right measuring 1.6 cm. There is a nodular density adjacent to the prostate gland on the left posteriorly measuring 1.1 cm. There is a nodular density in the perirectal space on the left measuring 9 mm Reproductive: Prostate gland is enlarged. Other: No abdominopelvic ascites. Small fat containing umbilical hernia. Musculoskeletal: There is a hematoma in the rectus sheath in the upper chest and abdomen on the right measuring 8.1 x 4.0 x 8.7 cm with surrounding fat stranding. Degenerative changes in the thoracic spine. No acute osseous abnormality. IMPRESSION: 1. Right rectus sheath hematoma in the right upper quadrant measuring 8.1 x 4.0 x 8.7 cm. 2. Patchy infiltrates in the lower lobes bilaterally. 3. Emphysema.  4. Nodular densities in the perirectal space bilaterally and adjacent to the prostate gland on the left, possible lymphadenopathy. Clinical correlation is recommended. 5. Enlarged prostate gland. 6. Coronary artery calcifications. 7. Remaining findings as described above. Electronically Signed   By: Brett Fairy M.D.   On: 05/10/2022 23:18   CT Angio Chest PE W and/or Wo Contrast  Result Date: 05/10/2022 CLINICAL DATA:  Pleuritic chest pain, short of breath, recent abnormal chest x-ray EXAM: CT ANGIOGRAPHY CHEST WITH CONTRAST TECHNIQUE: Multidetector CT imaging of the chest was performed  using the standard protocol during bolus administration of intravenous contrast. Multiplanar CT image reconstructions and MIPs were obtained to evaluate the vascular anatomy. RADIATION DOSE REDUCTION: This exam was performed according to the departmental dose-optimization program which includes automated exposure control, adjustment of the mA and/or kV according to patient size and/or use of iterative reconstruction technique. CONTRAST:  36m OMNIPAQUE IOHEXOL 350 MG/ML SOLN COMPARISON:  05/05/2022, 05/10/2022 FINDINGS: Cardiovascular: This is a technically adequate evaluation of the pulmonary vasculature. No filling defects or pulmonary emboli. Dilated main pulmonary arteries consistent with pulmonary arterial hypertension. The heart is unremarkable without pericardial effusion. No evidence of thoracic aortic aneurysm or dissection. Mediastinum/Nodes: No enlarged mediastinal, hilar, or axillary lymph nodes. Thyroid gland, trachea, and esophagus demonstrate no significant findings. Lungs/Pleura: Upper lobe predominant emphysema. There is bibasilar bronchial wall thickening, with areas of mucous plugging and scattered consolidation within the lower lobes. No effusion or pneumothorax. Upper Abdomen: There is abnormal density within the visualized portion of the right rectus musculature, which may reflect rectus sheath hematoma.  CT abdomen and pelvis is recommended for complete visualization. Musculoskeletal: No acute or destructive bony lesions. Reconstructed images demonstrate no additional findings. Review of the MIP images confirms the above findings. IMPRESSION: 1. No evidence of pulmonary embolus. 2. Bibasilar bronchial wall thickening, with areas of mucous plugging and likely lower lobe atelectasis as above. Findings consistent with bronchitis or reactive airway disease. 3. Incomplete visualization of right rectus sheath hematoma. Dedicated CT of the abdomen and pelvis is recommended. 4. Aortic Atherosclerosis (ICD10-I70.0) and Emphysema (ICD10-J43.9). Critical Value/emergent results were called by telephone at the time of interpretation on 05/10/2022 at 10:28 pm [facility], who verbally acknowledged these results. Electronically Signed   By: MRanda NgoM.D.   On: 05/10/2022 22:34   DG Chest Portable 1 View  Result Date: 05/10/2022 CLINICAL DATA:  Shortness of breath and cough EXAM: PORTABLE CHEST 1 VIEW COMPARISON:  05/05/2022 FINDINGS: Cardiac shadow is within normal limits. The lungs are well aerated bilaterally. Minimal scarring is noted in the bases. No focal confluent infiltrate is seen. No bony abnormality is noted. IMPRESSION: No active disease. Electronically Signed   By: MInez CatalinaM.D.   On: 05/10/2022 20:50   DG Chest 2 View  Result Date: 05/05/2022 CLINICAL DATA:  Shortness of breath.  COPD. EXAM: CHEST - 2 VIEW COMPARISON:  May 30, 2021 FINDINGS: A nodular density measuring up to 1.4 cm projects over the right mid lung, a new finding. Probable small effusions. Bibasilar opacities favored represent atelectasis. Hyperinflation of the lungs. No pneumothorax. No change in the cardiomediastinal silhouette. IMPRESSION: 1. A 1.4 cm nodular density projects over the right mid lung, a new finding. Recommend a CT scan of the chest for better evaluation. 2. Probable small effusions and  bibasilar atelectasis. 3. Hyperinflation of the lungs consistent with emphysema or COPD. 4. No other acute abnormalities. Electronically Signed   By: DDorise BullionIII M.D.   On: 05/05/2022 15:30     Assessment & Plan:   Acute bronchitis due to respiratory syncytial virus (RSV) - RSV positive on admission. CTA with bibasilar bronchial wall thickening and lower lobe atelectasis.  Discharged on prednisone through 12/15 and Tussionex for cough.   Acute respiratory failure with hypoxia ([place] - NEW; O2 started in December 2023 during admission for acute bronchitis secondary to RSV. CTA was negative for PE. SpO2 92-97% RA after walking 250 ft, needed to stop d/t fatigue. Advised he continue to wear 2L  supplemental O2 with moderate-heavy exertion and at bedtime. We will recheck ambulatory walk test at follow-up with goal to discontinue daytime oxygen.   COPD (chronic obstructive pulmonary disease) ([place]) - Advair changed to [facility] during most recent admission - Needs pulmonary function testing in 4-6 weeks  - Applying for short term disability d/t COPD, acute bronchitis secondary to RSV and acute respiratory failure.   Moderate obstructive sleep apnea - HST showed moderate OSA, AHI 15.6/hour - Unable to afford CPAP - Continue nocturnal oxygen  Rectus sheath hematoma, initial encounter - Secondary to cough. Not on blood thinner. No intervention recommended, expected to self resolve. Currently on oxycodone for pain control- we will provide refill, advised to use sparingly and taper frequency to q8 and alternate with tylenol.    Martyn Ehrich, NP 05/14/2022

## 2022-05-14 NOTE — Assessment & Plan Note (Addendum)
-   HST showed moderate OSA, AHI 15.6/hour - Unable to afford CPAP - Continue nocturnal oxygen

## 2022-05-14 NOTE — Assessment & Plan Note (Addendum)
-   RSV positive on admission. CTA with bibasilar bronchial wall thickening and lower lobe atelectasis.  Discharged on prednisone through 12/15 and Tussionex for cough.

## 2022-05-14 NOTE — Assessment & Plan Note (Addendum)
-   Advair changed to [facility] during most recent admission - Needs pulmonary function testing in 4-6 weeks  - Applying for short term disability d/t COPD, acute bronchitis secondary to RSV and acute respiratory failure.

## 2022-05-14 NOTE — Patient Outreach (Signed)
Care Coordination [facility] Note Transition Care Management Follow-up Telephone Call Date of discharge and from where: Sunday 05/12/22 Christopher Arias, Acute Respiratory Failure with hypoxia How have you been since you were released from the hospital? "I am doing okay and recuperating well.... using the home oxygen and not having any real problems; my family is looking after me very well but I am really able to do most things on my own.  We are checking my oxygen levels regularly and I am currently using the home O2 at 3 L/min.  I have a list of questions for the NP at the lung doctor's office this afternoon, I just want to make sure I am doing everything right and have everything under control so this does not happen again.  I used to walk 8 miles a day, I know I won't be able to do that right away, but over time, I want to get back to where I am able to walk for long distances.  I am taking short-term disability for awhile at work, because I just want to make sure I am fully recovered before I go back to work" Any questions or concerns? No  Items Reviewed: Did the pt receive and understand the discharge instructions provided? Yes  Medications obtained and verified? Yes  Other? No  Any new allergies since your discharge? No  Dietary orders reviewed? Yes Do you have support at home? Yes  spouse and daughter are assisting as needed; patient reports independent in self care  Home Care and Equipment/Supplies: Were home health services ordered? no If so, what is the name of the agency? N/A  Has the agency set up a time to come to the patient's home? not applicable Were any new equipment or medical supplies ordered?  Yes: home O2 What is the name of the medical supply agency? [employer] Were you able to get the supplies/equipment? yes Do you have any questions related to the use of the equipment or supplies? No  Functional Questionnaire: (I = Independent and D = Dependent) ADLs: I  spouse and daughter are  assisting as needed  Bathing/Dressing- I  spouse and daughter are assisting as needed  Meal Prep- I  spouse and daughter are assisting as needed  Eating- I  Maintaining continence- I  Transferring/Ambulation- I  Managing Meds- I  spouse and daughter are assisting as needed  Follow up appointments reviewed:  PCP Hospital f/u appt confirmed? No  Scheduled to see - on - @ [facility] f/u appt confirmed? Yes  Scheduled to see pulmonary provider on today, 05/14/22 @ 2:00 pm Are transportation arrangements needed? No  If their condition worsens, is the pt aware to call PCP or go to the Emergency Dept.? Yes Was the patient provided with contact information for the PCP's office or ED? No Was to pt encouraged to call back with questions or concerns? Yes  SDOH assessments and interventions completed:   Yes SDOH Interventions Today    Flowsheet Row Most Recent Value  SDOH Interventions   Food Insecurity Interventions Intervention Not Indicated  Transportation Interventions Intervention Not Indicated  [Patient normally drives self,  family assisting with needs while recuperation]      Care Coordination Interventions:  Provided education around safe use of home O2 and home O2 dosing parameters, coached patient around talking points to cover with pulmonary provider at today's appointment; provided education around general guidelines for resumption of activity after recent hospitalization    Encounter Outcome:  Pt. Visit Completed  Oneta Rack, RN, BSN, CCRN Alumnus RN CM Care Coordination/ Transition of [place] Management [phone]: direct office

## 2022-05-14 NOTE — Patient Instructions (Addendum)
You may test positive for RSV up to 6-8 weeks You are potentially contagious up until 8 days after testing positive (through 05/17/22)  Recommendations: - Continue Dulera two puffs morning and evening  - Stop flutter valve; Start using incentive spirometer 10 breaths/ hour while awake  - Finish the prednisone as directed, if cough worsens of steroids notify office - Refill oxycodone- take every 8 hours for abdominal pain (if you develop constipation start stool softener) - Refill tussionex take at bedtime for cough  - Bring in short term disability paperwork for [place] to complete  - Continue to wear 2L oxygen with moderate-heavy exertion and at bedtime (you did alright on walk test today but would not recommend discontinuing until we reassess at follow-up) - Walk again at follow-up visit   Orders: - PFTs in 4-6 weeks   Follow-up: - 4-6 weeks with Christopher Maize NP

## 2022-05-14 NOTE — Progress Notes (Signed)
Reviewed and agree with assessment/plan.   Chesley Mires, MD [facility] Pulmonary/Critical Care 05/14/2022, 3:19 PM Pager:  [phone]

## 2022-05-14 NOTE — Telephone Encounter (Signed)
Patient has office visit on 12/12. Will access at office visit. Nothing further needed

## 2022-05-15 ENCOUNTER — Telehealth: Payer: Self-pay | Admitting: Pulmonary Disease

## 2022-05-15 NOTE — Telephone Encounter (Signed)
Patient's wife is calling regarding the 2 refills that were supposed to be sent to [facility].  She stated patient had a visit with Volanda Napoleon yesterday and she was going to send prescription to pharmacy.  The pharmacy does not have a record of medication.  Please advise and call patient to confirm at [phone]

## 2022-05-16 LAB — CULTURE, BLOOD (ROUTINE X 2)
Culture: NO GROWTH
Culture: NO GROWTH
Special Requests: ADEQUATE

## 2022-05-16 NOTE — Telephone Encounter (Signed)
Calling again. Same as last encounter:  Patient's wife is calling regarding the 2 refills that were supposed to be sent to [facility].  She stated patient had a visit with Volanda Napoleon yesterday and she was going to send prescription to pharmacy.  The pharmacy does not have a record of medication.  Please advise and call patient to confirm at [phone]

## 2022-05-16 NOTE — Telephone Encounter (Signed)
Christopher Arias,  Hey you saw this patient on 05/14/2022 and on your AVS it states to refill Tussionex and his Oxycodone. It does not look like this was ever done.  Can you please refill these when you have a moment  Thank you

## 2022-05-16 NOTE — Telephone Encounter (Signed)
[facility]  PT Wife calling again on req for script. Please see conversation chain. TY.

## 2022-05-17 MED ORDER — HYDROCOD POLI-CHLORPHE POLI ER 10-8 MG/5ML PO SUER: 5.0000 mL | Freq: Two times a day (BID) | ORAL | 0 refills | Status: DC

## 2022-05-17 MED ORDER — OXYCODONE HCL 5 MG PO TABS: 5.0000 mg | ORAL_TABLET | Freq: Three times a day (TID) | ORAL | 0 refills | Status: DC | PRN

## 2022-05-17 NOTE — Telephone Encounter (Signed)
Spoke with the pt's spouse and notified that rx was sent  Nothing further needed

## 2022-05-17 NOTE — Telephone Encounter (Signed)
Sent!

## 2022-05-21 ENCOUNTER — Encounter: Payer: Self-pay | Admitting: Internal Medicine

## 2022-05-21 ENCOUNTER — Telehealth: Payer: Self-pay

## 2022-05-21 ENCOUNTER — Ambulatory Visit: Payer: BC Managed Care – PPO | Admitting: Internal Medicine

## 2022-05-21 VITALS — BP 126/72 | HR 104 | Temp 98.0°F | Ht 64.0 in | Wt 163.0 lb

## 2022-05-21 DIAGNOSIS — J449 Chronic obstructive pulmonary disease, unspecified: Secondary | ICD-10-CM

## 2022-05-21 DIAGNOSIS — S301XXA Contusion of abdominal wall, initial encounter: Secondary | ICD-10-CM | POA: Diagnosis not present

## 2022-05-21 DIAGNOSIS — Z125 Encounter for screening for malignant neoplasm of prostate: Secondary | ICD-10-CM

## 2022-05-21 DIAGNOSIS — Z0001 Encounter for general adult medical examination with abnormal findings: Secondary | ICD-10-CM | POA: Diagnosis not present

## 2022-05-21 DIAGNOSIS — Z23 Encounter for immunization: Secondary | ICD-10-CM | POA: Diagnosis not present

## 2022-05-21 DIAGNOSIS — E782 Mixed hyperlipidemia: Secondary | ICD-10-CM | POA: Diagnosis not present

## 2022-05-21 DIAGNOSIS — R739 Hyperglycemia, unspecified: Secondary | ICD-10-CM

## 2022-05-21 DIAGNOSIS — J9601 Acute respiratory failure with hypoxia: Secondary | ICD-10-CM | POA: Diagnosis not present

## 2022-05-21 DIAGNOSIS — E039 Hypothyroidism, unspecified: Secondary | ICD-10-CM

## 2022-05-21 DIAGNOSIS — J121 Respiratory syncytial virus pneumonia: Secondary | ICD-10-CM

## 2022-05-21 MED ORDER — ATORVASTATIN CALCIUM 20 MG PO TABS: 20.0000 mg | ORAL_TABLET | Freq: Every day | ORAL | 3 refills | Status: DC

## 2022-05-21 MED ORDER — SERTRALINE HCL 100 MG PO TABS: 200.0000 mg | ORAL_TABLET | Freq: Every day | ORAL | 3 refills | Status: DC

## 2022-05-21 MED ORDER — LEVOTHYROXINE SODIUM 100 MCG PO TABS: 100.0000 ug | ORAL_TABLET | Freq: Every day | ORAL | 3 refills | Status: DC

## 2022-05-21 MED ORDER — CLONAZEPAM 1 MG PO TABS: 1.0000 mg | ORAL_TABLET | Freq: Two times a day (BID) | ORAL | 2 refills | Status: DC | PRN

## 2022-05-21 NOTE — Progress Notes (Signed)
Patient ID: Christopher Arias, male   DOB: [birth year], 61 y.o.   MRN: [id number]         Chief Complaint:: wellness exam and Hospital follow up (RSV)  , acute hypoxic resp failure, low thyroid, hyperglycemia, hld, copd, hematoma       HPI:  Christopher Arias is a 61 y.o. male here for wellness exam; declines covid booster, for flu shot today, o/w up to date                        Also Pt denies chest pain, increased sob or doe, wheezing, orthopnea, PND, increased LE swelling, palpitations, dizziness or syncope.  Trying to wean himself off o2.   Pt denies fever, wt loss, night sweats, loss of appetite, or other constitutional symptoms   Pt denies polydipsia, polyuria, or new focal neuro s/s.   Denies hyper or hypo thyroid symptoms such as voice, skin or hair change. Still has large bruising to right side but seems to be improving.     Wt Readings from Last 3 Encounters:  05/21/22 163 lb (73.9 kg)  05/14/22 165 lb 8 oz (75.1 kg)  05/10/22 159 lb 2.8 oz (72.2 kg)   BP Readings from Last 3 Encounters:  05/21/22 126/72  05/14/22 112/68  05/12/22 118/76   Immunization History  Administered Date(s) Administered   H1N1 07/19/2008   Influenza,inj,Quad PF,6+ Mos 03/12/2013, 06/17/2014, 03/08/2019, 05/30/2021, 05/21/2022   PFIZER(Purple Top)SARS-COV-2 Vaccination 08/30/2019, 09/20/2019   Td 07/19/2008   Tdap 09/13/2012   Zoster Recombinat (Shingrix) 11/27/2020, 01/31/2021   Health Maintenance Due  Topic Date Due   COVID-19 Vaccine (3 - 2023-24 season) 02/01/2022      Past Medical History:  Diagnosis Date   ANTERIOR PITUITARY HYPERFUNCTION 11/23/2009   ANXIETY 11/24/2007   COPD (chronic obstructive pulmonary disease) ([place]) 05/31/2021   GERD 11/24/2007   GYNECOMASTIA 11/10/2009   HYPERLIPIDEMIA 11/24/2007   HYPOTHYROIDISM, POST-RADIATION 03/16/2010   Past Surgical History:  Procedure Laterality Date   INGUINAL HERNIA REPAIR      reports that he quit smoking about 8 months ago. His smoking use  included cigarettes. He has a 20.00 pack-year smoking history. He has never used smokeless tobacco. He reports current alcohol use. He reports that he does not use drugs. family history includes COPD in an other family member; Heart disease in his father and another family member; Stroke in an other family member. Allergies  Allergen Reactions   Atenolol Other (See Comments)    Nose bleeds   Current Outpatient Medications on File Prior to Visit  Medication Sig Dispense Refill   albuterol (VENTOLIN HFA) 108 (90 Base) MCG/ACT inhaler Inhale 2 puffs into the lungs every 6 (six) hours as needed for wheezing or shortness of breath. 18 g 3   chlorpheniramine-HYDROcodone (TUSSIONEX) 10-8 MG/5ML Take 5 mLs by mouth every 12 (twelve) hours. 70 mL 0   fluticasone-salmeterol (ADVAIR) 250-50 MCG/ACT AEPB Inhale 1 puff into the lungs in the morning and at bedtime. 3 each 3   mometasone-formoterol (DULERA) 100-5 MCG/ACT AERO Inhale 2 puffs into the lungs 2 (two) times daily.     oxyCODONE (OXY IR/ROXICODONE) 5 MG immediate release tablet Take 1 tablet (5 mg total) by mouth every 8 (eight) hours as needed for moderate pain. 20 tablet 0   triamcinolone (NASACORT) 55 MCG/ACT AERO nasal inhaler Place 2 sprays into the nose daily. (Patient taking differently: Place 2 sprays into the nose daily as needed (allergies).)  1 each 12   varenicline (CHANTIX CONTINUING MONTH PAK) 1 MG tablet Take 1 tablet (1 mg total) by mouth 2 (two) times daily. 60 tablet 2   aspirin 81 MG EC tablet Take 1 tablet (81 mg total) by mouth daily. Swallow whole. (Patient not taking: Reported on 05/14/2022) 30 tablet 12   No current facility-administered medications on file prior to visit.        ROS:  All others reviewed and negative.  Objective        PE:  BP 126/72 (BP Location: Right Arm, Patient Position: Sitting, Cuff Size: Large)   Pulse (!) 104   Temp 98 F (36.7 C) (Oral)   Ht '5\' 4"'$  (1.626 m)   Wt 163 lb (73.9 kg)   SpO2 91%    BMI 27.98 kg/m                 Constitutional: Pt appears in NAD               HENT: Head: NCAT.                Right Ear: External ear normal.                 Left Ear: External ear normal.                Eyes: . Pupils are equal, round, and reactive to light. Conjunctivae and EOM are normal               Nose: without d/c or deformity               Neck: Neck supple. Gross normal ROM               Cardiovascular: Normal rate and regular rhythm.                 Pulmonary/Chest: Effort normal and breath sounds without rales or wheezing.                Abd:  Soft, NT, ND, + BS, no organomegaly               Neurological: Pt is alert. At baseline orientation, motor grossly intact               Skin: Skin is warm. No rashes, no other new lesions, LE edema - none               Psychiatric: Pt behavior is normal without agitation   Micro: none  Cardiac tracings I have personally interpreted today:  none  Pertinent Radiological findings (summarize): none   Lab Results  Component Value Date   WBC 14.6 (H) 05/11/2022   HGB 13.1 05/11/2022   HCT 38.9 (L) 05/11/2022   PLT 227 05/11/2022   GLUCOSE 149 (H) 05/11/2022   CHOL 162 05/30/2021   TRIG 72.0 05/30/2021   HDL 44.80 05/30/2021   LDLCALC 103 (H) 05/30/2021   ALT 19 05/10/2022   AST 15 05/10/2022   NA 136 05/11/2022   K 3.7 05/11/2022   CL 107 05/11/2022   CREATININE 0.89 05/11/2022   BUN 22 05/11/2022   CO2 22 05/11/2022   TSH 2.80 05/30/2021   PSA 3.93 11/27/2020   HGBA1C 5.7 05/30/2021   Assessment/Plan:  Christopher Arias is a 61 y.o. White or Caucasian [1] male with  has a past medical history of ANTERIOR PITUITARY HYPERFUNCTION (11/23/2009), ANXIETY (11/24/2007), COPD (chronic obstructive pulmonary disease) ([place]) (05/31/2021),  GERD (11/24/2007), GYNECOMASTIA (11/10/2009), HYPERLIPIDEMIA (11/24/2007), and HYPOTHYROIDISM, POST-RADIATION (03/16/2010).  Encounter for well adult exam with abnormal findings Age and sex appropriate  education and counseling updated with regular exercise and diet Referrals for preventative services - none needed Immunizations addressed - for flu shot today Smoking counseling  - none needed Evidence for depression or other mood disorder - none significant Most recent labs reviewed. I have personally reviewed and have noted: 1) the patient's medical and social history 2) The patient's current medications and supplements 3) The patient's height, weight, and BMI have been recorded in the chart   COPD (chronic obstructive pulmonary disease) (HCC) Stable overall, cont inhaler prn  Hyperglycemia Lab Results  Component Value Date   HGBA1C 5.7 05/30/2021   Stable, pt to continue current medical treatment - diet, wt control, excercise   Hyperlipidemia Lab Results  Component Value Date   LDLCALC 103 (H) 05/30/2021   Uncontrolled, goal ldl < 100, pt to continue current statin lipitor 20 mg, lower chol diet, and f/u lab today   Hypothyroidism Lab Results  Component Value Date   TSH 2.80 05/30/2021   Stable, pt to continue levothyroxine 100 mcg qd   Acute respiratory failure with hypoxia (HCC) Resolving, cont to wean off o2  Pneumonia due to respiratory syncytial virus (RSV) Resolved without complication,  to f/u any worsening symptoms or concerns   Rectus sheath hematoma, initial encounter Overall stable to improved, not on anticoagulant, apparently related to severe coughing now improved,  to f/u any worsening symptoms or concerns  Followup: Return in about 6 months (around 11/20/2022).  Cathlean Cower, MD 05/21/2022 7:46 PM [facility]

## 2022-05-21 NOTE — Assessment & Plan Note (Signed)
Lab Results  Component Value Date   LDLCALC 103 (H) 05/30/2021   Uncontrolled, goal ldl < 100, pt to continue current statin lipitor 20 mg, lower chol diet, and f/u lab today

## 2022-05-21 NOTE — Assessment & Plan Note (Signed)
Resolved without complication,  to f/u any worsening symptoms or concerns

## 2022-05-21 NOTE — Telephone Encounter (Signed)
A PA request received via CMM through [employer] for oxyCODONE HCl '5MG'$  tablets  PA has been submitted and is awaiting determination.  KeyAnner Crete - PA Case ID: [id number]

## 2022-05-21 NOTE — Assessment & Plan Note (Signed)
Lab Results  Component Value Date   TSH 2.80 05/30/2021   Stable, pt to continue levothyroxine 100 mcg qd

## 2022-05-21 NOTE — Assessment & Plan Note (Signed)
Overall stable to improved, not on anticoagulant, apparently related to severe coughing now improved,  to f/u any worsening symptoms or concerns

## 2022-05-21 NOTE — Assessment & Plan Note (Signed)
Age and sex appropriate education and counseling updated with regular exercise and diet Referrals for preventative services - none needed Immunizations addressed - for flu shot today Smoking counseling  - none needed Evidence for depression or other mood disorder - none significant Most recent labs reviewed. I have personally reviewed and have noted: 1) the patient's medical and social history 2) The patient's current medications and supplements 3) The patient's height, weight, and BMI have been recorded in the chart  

## 2022-05-21 NOTE — Telephone Encounter (Signed)
PA for oxycodone '5mg'$  tablets has been APPROVED from 05/21/2022-06/21/2022

## 2022-05-21 NOTE — Assessment & Plan Note (Signed)
Resolving, cont to wean off o2

## 2022-05-21 NOTE — Assessment & Plan Note (Signed)
Lab Results  Component Value Date   HGBA1C 5.7 05/30/2021   Stable, pt to continue current medical treatment - diet, wt control, excercise

## 2022-05-21 NOTE — Assessment & Plan Note (Signed)
Stable overall, cont inhaler prn 

## 2022-05-21 NOTE — Patient Instructions (Signed)
You had the flu shot today  Please continue to wean off the oxygen as you can  Please continue all other medications as before, and refills have been done if requested.  Please have the pharmacy call with any other refills you may need.  Please continue your efforts at being more active, low cholesterol diet, and weight control.  Please keep your appointments with your specialists as you may have planned  Please go to the LAB at the blood drawing area for the tests to be done  You will be contacted by phone if any changes need to be made immediately.  Otherwise, you will receive a letter about your results with an explanation, but please check with MyChart first.  Please remember to sign up for MyChart if you have not done so, as this will be important to you in the future with finding out test results, communicating by private email, and scheduling acute appointments online when needed.  Please make an Appointment to return in 6 months, or sooner if needed

## 2022-05-22 ENCOUNTER — Other Ambulatory Visit: Payer: Self-pay | Admitting: Internal Medicine

## 2022-05-22 DIAGNOSIS — R972 Elevated prostate specific antigen [PSA]: Secondary | ICD-10-CM

## 2022-05-22 LAB — PSA: PSA: 8.84 ng/mL — ABNORMAL HIGH (ref 0.10–4.00)

## 2022-05-22 LAB — LIPID PANEL
Cholesterol: 156 mg/dL (ref 0–200)
HDL: 60.5 mg/dL (ref >39.00)
LDL Cholesterol: 77 mg/dL (ref 0–99)
NonHDL: 95.06
Total CHOL/HDL Ratio: 3
Triglycerides: 89 mg/dL (ref 0.0–149.0)
VLDL: 17.8 mg/dL (ref 0.0–40.0)

## 2022-05-22 LAB — TSH: TSH: 15.96 u[IU]/mL — ABNORMAL HIGH (ref 0.35–5.50)

## 2022-05-22 LAB — HEMOGLOBIN A1C: Hgb A1c MFr Bld: 5.9 % (ref 4.6–6.5)

## 2022-05-24 ENCOUNTER — Telehealth: Payer: Self-pay | Admitting: Primary Care

## 2022-05-24 NOTE — Telephone Encounter (Signed)
Patient's FMLA documentation was completed and signed by Derl Barrow, NP on 05/21/22.  I have faxed it and the medication list to the [facility] fax# [phone].  Called patient to let him know and mailed him a hard copy.

## 2022-05-31 NOTE — Telephone Encounter (Signed)
Patient called to say [employer] got the FMLA form - but it needs to be amended to cover 26 weeks of absence.  They will be faxing a new form in to have the expiration date extended to 12/01/2022.

## 2022-06-04 ENCOUNTER — Inpatient Hospital Stay: Payer: BC Managed Care – PPO | Admitting: Primary Care

## 2022-06-05 NOTE — Telephone Encounter (Addendum)
We have not received a new FMLA form from [employer], but I spoke to patient and we have revised the form submitted to show return to work date of 10/01/22 - it was initialed and dated by Derl Barrow, NP.  I faxed to [facility] at fax# [phone].

## 2022-06-06 ENCOUNTER — Other Ambulatory Visit: Payer: BC Managed Care – PPO

## 2022-06-13 ENCOUNTER — Telehealth: Payer: Self-pay

## 2022-06-13 DIAGNOSIS — J210 Acute bronchiolitis due to respiratory syncytial virus: Secondary | ICD-10-CM | POA: Diagnosis not present

## 2022-06-13 DIAGNOSIS — J449 Chronic obstructive pulmonary disease, unspecified: Secondary | ICD-10-CM | POA: Diagnosis not present

## 2022-06-13 NOTE — Telephone Encounter (Signed)
Needs ROV 

## 2022-06-13 NOTE — Telephone Encounter (Signed)
Patient wife was seen in office today and stated the nerve medication he is on , needs to be changed due to having reaction also she states since being out of the ER he is having trouble sleeping and needs a prescription for sleep medication.

## 2022-06-14 NOTE — Telephone Encounter (Signed)
Patient declined ROV and states that all is well he just has a few nights of not sleeping and is not concerned.

## 2022-06-21 ENCOUNTER — Ambulatory Visit (INDEPENDENT_AMBULATORY_CARE_PROVIDER_SITE_OTHER): Payer: BC Managed Care – PPO | Admitting: Primary Care

## 2022-06-21 ENCOUNTER — Encounter: Payer: Self-pay | Admitting: Primary Care

## 2022-06-21 VITALS — BP 118/70 | HR 123 | Ht 65.0 in | Wt 168.0 lb

## 2022-06-21 DIAGNOSIS — J9601 Acute respiratory failure with hypoxia: Secondary | ICD-10-CM

## 2022-06-21 DIAGNOSIS — R000 Tachycardia, unspecified: Secondary | ICD-10-CM | POA: Diagnosis not present

## 2022-06-21 DIAGNOSIS — J449 Chronic obstructive pulmonary disease, unspecified: Secondary | ICD-10-CM | POA: Diagnosis not present

## 2022-06-21 DIAGNOSIS — I4892 Unspecified atrial flutter: Secondary | ICD-10-CM | POA: Diagnosis not present

## 2022-06-21 DIAGNOSIS — R0609 Other forms of dyspnea: Secondary | ICD-10-CM

## 2022-06-21 MED ORDER — PREDNISONE 20 MG PO TABS: ORAL_TABLET | ORAL | 0 refills | Status: DC

## 2022-06-21 NOTE — Progress Notes (Addendum)
$'@Patient'u$  ID: Christopher Arias, male    DOB: [birth year], 62 y.o.   MRN: [id number]  Chief Complaint  Patient presents with   Follow-up    Breathing  not good     Referring provider: Biagio Borg, MD  HPI: 62 year old male, former smoker. PMH significant for OSA, COPD, GERD, hypothyroidism, hyperlipidemia, rectus sheath hematoma.  Previous [facility] pulmonary encounter: 05/07/2022 Patient presents today for acute overview.  Patient was seen at urgent care 2 days ago for shortness of breath and cough.  Chest x-ray without acute findings, possible new nodule right middle lung recommending CT chest follow-up.  Diagnosed with COPD exacerbation and given prednisone 40 mg daily x 5 days.  Advised to follow-up with pulmonary office.  Cough started 5 days ago. He has some mucus production with clear sputum  Associated shortness of breath and wheezing  CXR showed nodular density 1.4cm right mid lung, which is new. Small effusion and attelectaiss.  He travels for work and needs a note to return   05/14/2022 Patient presents today for hospital follow-up/pneumonia He was seen on 05/07/22, given Levaquin. Walked in office and did not require supplement oxygen. He was hospitalized from 12/8-12/10 for acute respiratory failure with hypoxia and acute bronchitis due to RSV.  He developed severe right sided pain on Friday 12/8. COVID and flu were negative.  RSV positive on admission. CTA negative for PE, bibasilar bronchial wall thickening and lower lobe atelectasis.  Patient with new oxygen requirements 2 to 4 L to maintain O2 greater than 92%.  Patient had incidental findings of a large rectus sheath hematoma in the right upper quadrant from severe coughing, no intervention necessary and expected to self resolve.  Patient was discharged on steroids, oxygen, tussionex and oxycodone for pain control given rectus hematoma.  He is doing alright today. He does not cough when wearing oxygen. Cough is dry. Right sided  pain with cough 10/10. At rest pain is a 0/10. He is taking oxycodone every 6 hours for pain control and Tussionex at bedtime. He is on prednisone until Friday. Advair was switched to [facility] during hospitalization. Needs updated PFTs. He is applying for short term disability, plans on retiring soon.    06/21/2022 Patient presents today for follow-up respiratory failure/ hypoxia  He was hospitalized for RSV on 12/8. CTA negative for PE, bibasilar bronchial wall thickening and lower lobe atelectasis.  Patient with new oxygen requirements 2 to 4 L to maintain O2 greater than 92%.  Patient had incidental findings of a large rectus sheath hematoma in the right upper quadrant from severe coughing, no intervention necessary and expected to self resolve.  He has a productive cough with mucus. Using oxygen 3-4L with exertion  Compliant with Advair and uses rescue inhaler daily  CTA 05/10/22 negative for PE, showed bronchial thickening and mucus plugs No recent pulmonary function testing  Tachycardia, no active chest pain. Possible aflutter on EKG.  Referring to cardiology, ordered for echocardiogram   Allergies  Allergen Reactions   Atenolol Other (See Comments)    Nose bleeds    Immunization History  Administered Date(s) Administered   H1N1 07/19/2008   Influenza,inj,Quad PF,6+ Mos 03/12/2013, 06/17/2014, 03/08/2019, 05/30/2021, 05/21/2022   PFIZER(Purple Top)SARS-COV-2 Vaccination 08/30/2019, 09/20/2019   Td 07/19/2008   Tdap 09/13/2012   Zoster Recombinat (Shingrix) 11/27/2020, 01/31/2021    Past Medical History:  Diagnosis Date   ANTERIOR PITUITARY HYPERFUNCTION 11/23/2009   ANXIETY 11/24/2007   COPD (chronic obstructive pulmonary disease) ([place])  05/31/2021   GERD 11/24/2007   GYNECOMASTIA 11/10/2009   HYPERLIPIDEMIA 11/24/2007   HYPOTHYROIDISM, POST-RADIATION 03/16/2010    Tobacco History: Social History   Tobacco Use  Smoking Status Former   Packs/day: 1.00   Years: 20.00    Total pack years: 20.00   Types: Cigarettes   Quit date: 08/24/2021   Years since quitting: 0.8  Smokeless Tobacco Never   Counseling given: Not Answered   Outpatient Medications Prior to Visit  Medication Sig Dispense Refill   albuterol (VENTOLIN HFA) 108 (90 Base) MCG/ACT inhaler Inhale 2 puffs into the lungs every 6 (six) hours as needed for wheezing or shortness of breath. 18 g 3   aspirin 81 MG EC tablet Take 1 tablet (81 mg total) by mouth daily. Swallow whole. 30 tablet 12   atorvastatin (LIPITOR) 20 MG tablet Take 1 tablet (20 mg total) by mouth daily. 90 tablet 3   chlorpheniramine-HYDROcodone (TUSSIONEX) 10-8 MG/5ML Take 5 mLs by mouth every 12 (twelve) hours. 70 mL 0   clonazePAM (KLONOPIN) 1 MG tablet Take 1 tablet (1 mg total) by mouth 2 (two) times daily as needed. 60 tablet 2   fluticasone-salmeterol (ADVAIR) 250-50 MCG/ACT AEPB Inhale 1 puff into the lungs in the morning and at bedtime. 3 each 3   levothyroxine (SYNTHROID) 100 MCG tablet Take 1 tablet (100 mcg total) by mouth daily. 90 tablet 3   mometasone-formoterol (DULERA) 100-5 MCG/ACT AERO Inhale 2 puffs into the lungs 2 (two) times daily.     oxyCODONE (OXY IR/ROXICODONE) 5 MG immediate release tablet Take 1 tablet (5 mg total) by mouth every 8 (eight) hours as needed for moderate pain. 20 tablet 0   sertraline (ZOLOFT) 100 MG tablet Take 2 tablets (200 mg total) by mouth daily. 180 tablet 3   triamcinolone (NASACORT) 55 MCG/ACT AERO nasal inhaler Place 2 sprays into the nose daily. (Patient taking differently: Place 2 sprays into the nose daily as needed (allergies).) 1 each 12   varenicline (CHANTIX CONTINUING MONTH PAK) 1 MG tablet Take 1 tablet (1 mg total) by mouth 2 (two) times daily. 60 tablet 2   No facility-administered medications prior to visit.   Review of Systems  Review of Systems  Respiratory:  Positive for cough.      Physical Exam  BP 118/70 (BP Location: Left Arm, Patient Position: Sitting,  Cuff Size: Small)   Pulse (!) 123   Ht '5\' 5"'$  (1.651 m)   Wt 168 lb (76.2 kg)   SpO2 90%   BMI 27.96 kg/m  Physical Exam Constitutional:      Appearance: Normal appearance.  Cardiovascular:     Rate and Rhythm: Tachycardia present.  Pulmonary:     Effort: Pulmonary effort is normal.     Breath sounds: Wheezing and rhonchi present.  Neurological:     General: No focal deficit present.     Mental Status: He is alert and oriented to person, place, and time. Mental status is at baseline.  Psychiatric:        Mood and Affect: Mood normal.        Behavior: Behavior normal.        Thought Content: Thought content normal.        Judgment: Judgment normal.      Lab Results:  CBC    Component Value Date/Time   WBC 14.6 (H) 05/11/2022 0500   RBC 4.37 05/11/2022 0500   HGB 13.1 05/11/2022 0500   HCT 38.9 (L) 05/11/2022 0500  PLT 227 05/11/2022 0500   MCV 89.0 05/11/2022 0500   MCH 30.0 05/11/2022 0500   MCHC 33.7 05/11/2022 0500   RDW 13.2 05/11/2022 0500   LYMPHSABS 1.1 05/10/2022 2049   MONOABS 0.8 05/10/2022 2049   EOSABS 0.0 05/10/2022 2049   BASOSABS 0.0 05/10/2022 2049    BMET    Component Value Date/Time   NA 136 05/11/2022 0500   K 3.7 05/11/2022 0500   CL 107 05/11/2022 0500   CO2 22 05/11/2022 0500   GLUCOSE 149 (H) 05/11/2022 0500   BUN 22 05/11/2022 0500   CREATININE 0.89 05/11/2022 0500   CALCIUM 8.6 (L) 05/11/2022 0500   GFRNONAA >60 05/11/2022 0500   GFRAA  07/25/2010 0627    >60        The eGFR has been calculated using the MDRD equation. This calculation has not been validated in all clinical situations. eGFR's persistently <60 mL/min signify possible Chronic Kidney Disease.    BNP    Component Value Date/Time   BNP 63.9 05/10/2022 2049    ProBNP    Component Value Date/Time   PROBNP <30.0 07/25/2010 7616    Imaging: No results found.   Assessment & Plan:   COPD (chronic obstructive pulmonary disease) (HCC) - Chronic cough  with associated wheezing  - Changing Advair to [facility] - Pulmonary function testing scheduled for March - Needs sputum culture  - Rx prednisone '40mg'$  x 5 days   Acute respiratory failure with hypoxia (HCC) - Checking PFTs.  CTA in December 2023 negative for PE - EKG today with possible a flutter, ordering echocardiogram and referring to cardiology - Referring to pulmonary rehab    Recommendation: Mucinex '1200mg'$  twice daily Use flutter three times a day  Stop Advair after tonight's dose  Start Breztri - take two puffs twice daily starting 06/22/22 ( rinse mouth after use) Start prednisone '40mg'$  x 5 days   Orders: Check EKG re: tachycardia  DME order- add humidification to oxygen (please order) Please check respiratory sputum culture (please give specimen cup and order)  Referral: Pulmonary rehab re: respiratory failure (ordered)  Follow-up: Please get patient apt with Dr. Halford Chessman in [place] or [place]  Keep apt for PFTs in March and FU with Maryclare Bean, NP 06/24/2022

## 2022-06-21 NOTE — Patient Instructions (Addendum)
Recommendation: Mucinex '1200mg'$  twice daily Use flutter three times a day  Stop Advair after tonight's dose  Start Breztri - take two puffs twice daily starting 06/22/22 ( rinse mouth after use) Start prednisone '40mg'$  x 5 days   Orders: Check EKG re: tachycardia  DME order- add humidification to oxygen (please order) Please check respiratory sputum culture (please give specimen cup and order)  Referral: Pulmonary rehab re: respiratory failure (ordered)  Follow-up: Please get patient apt with Dr. Halford Chessman in [place] or [place]  Keep apt for PFTs in March and FU with [facility]

## 2022-06-24 ENCOUNTER — Telehealth: Payer: Self-pay | Admitting: Primary Care

## 2022-06-24 NOTE — Assessment & Plan Note (Signed)
-  Checking PFTs.  CTA in December 2023 negative for PE - EKG today with possible a flutter, ordering echocardiogram and referring to cardiology - Referring to pulmonary rehab

## 2022-06-24 NOTE — Assessment & Plan Note (Addendum)
-  Chronic cough with associated wheezing  - Changing Advair to [facility] - Pulmonary function testing scheduled for March - Needs sputum culture  - Rx prednisone '40mg'$  x 5 days

## 2022-06-24 NOTE — Telephone Encounter (Signed)
I sent order for humidification to O2 to [facility] this morning because that is the last dme I saw that we used.  Received e-mal from [place] that pt uses another company for O2.  I called the pt & spoke to his wife and she told me [employer] and stated she just called our office to make [place] aware.  I have sent order to [place].  Nothing further needed.

## 2022-06-24 NOTE — Telephone Encounter (Signed)
Patient called to ask if we have gotten new FMLA/disability paperwork from his employer.  He said based on this appointment with B. Volanda Napoleon, NP on 1/19 - he would like to have the expiration date pushed out to the end of March or April 2024.  Paperwork has not been received yet.

## 2022-06-24 NOTE — Telephone Encounter (Signed)
[employer] calling PT about new settings on his portable O2. PT's wife states the wrong company is calling them so perhaps we sent the RX to the wrong place. Pls call to advise. Wife is @ [phone]

## 2022-06-25 ENCOUNTER — Telehealth (HOSPITAL_COMMUNITY): Payer: Self-pay | Admitting: *Deleted

## 2022-06-25 NOTE — Telephone Encounter (Signed)
Received referral notification from Dr. Halford Chessman for this pt to participate in pulmonary rehab with the diagnosis of Chronic Respiratory Failure.  Noted on progress note pt with high HR 123.  EKG performed which showed Aflutter.  Pt to have echo and Cardiology consult.  Left message for pt advising that although we have the referral we will need to hold off for now pending Cardiology consult. Echo scheduled for 2/8 and the cardiology consult is pending. Cherre Huger, BSN Cardiac and Training and development officer

## 2022-06-26 ENCOUNTER — Other Ambulatory Visit: Payer: Self-pay | Admitting: Pulmonary Disease

## 2022-07-05 ENCOUNTER — Other Ambulatory Visit: Payer: Self-pay | Admitting: Pulmonary Disease

## 2022-07-05 NOTE — Telephone Encounter (Signed)
Received a phone call from the patient - employer has requested copies of office visit notes.   I faxed notes from 05/14/2022 and 06/11/2022 to [facility] fax# [phone]

## 2022-07-10 DIAGNOSIS — Z0289 Encounter for other administrative examinations: Secondary | ICD-10-CM

## 2022-07-11 ENCOUNTER — Ambulatory Visit (HOSPITAL_COMMUNITY)
Admission: RE | Admit: 2022-07-11 | Discharge: 2022-07-11 | Disposition: A | Discharge status: Home / Self Care | Payer: BC Managed Care – PPO | Source: Ambulatory Visit | Attending: Primary Care | Admitting: Primary Care

## 2022-07-11 DIAGNOSIS — J449 Chronic obstructive pulmonary disease, unspecified: Secondary | ICD-10-CM | POA: Diagnosis not present

## 2022-07-11 DIAGNOSIS — I4892 Unspecified atrial flutter: Secondary | ICD-10-CM

## 2022-07-11 DIAGNOSIS — I351 Nonrheumatic aortic (valve) insufficiency: Secondary | ICD-10-CM | POA: Diagnosis not present

## 2022-07-11 DIAGNOSIS — R000 Tachycardia, unspecified: Secondary | ICD-10-CM | POA: Diagnosis not present

## 2022-07-11 DIAGNOSIS — Z87891 Personal history of nicotine dependence: Secondary | ICD-10-CM | POA: Diagnosis not present

## 2022-07-11 DIAGNOSIS — E785 Hyperlipidemia, unspecified: Secondary | ICD-10-CM | POA: Diagnosis not present

## 2022-07-11 LAB — ECHOCARDIOGRAM COMPLETE
AR max vel: 4.35 cm2
AV Area VTI: 4.94 cm2
AV Area mean vel: 4.34 cm2
AV Mean grad: 4 mmHg
AV Peak grad: 6.9 mmHg
Ao pk vel: 1.31 m/s
Area-P 1/2: 6.48 cm2

## 2022-07-14 DIAGNOSIS — J449 Chronic obstructive pulmonary disease, unspecified: Secondary | ICD-10-CM | POA: Diagnosis not present

## 2022-07-14 DIAGNOSIS — J210 Acute bronchiolitis due to respiratory syncytial virus: Secondary | ICD-10-CM | POA: Diagnosis not present

## 2022-07-14 NOTE — H&P (View-Only) (Signed)
Cardiology Office Note:    Date:  07/15/2022   ID:  Christopher Arias, DOB July 21, 1960, MRN [id number]  PCP:  Biagio Borg, MD  Cardiologist:  Donato Heinz, MD  Electrophysiologist:  None   Referring MD: Martyn Ehrich, NP   Chief Complaint  Patient presents with   Cardiac Valve Problem    History of Present Illness:    Christopher Arias is a 62 y.o. male with a hx of COPD on home 3L, hypothyroidism, hyperlipidemia, OSA, rectus sheath hematoma who is referred for evaluation of atrial flutter.  EKG 06/21/2022 read as atrial flutter but appears sinus rhythm.  Echocardiogram 07/11/2022 showed EF 60 to 123456, grade 1 diastolic dysfunction, normal RV function, mild to moderate AI, possible small echodensity on aortic valve on subcostal images.  He reports he has been having shortness of breath since his hospitalization in December 2023 with acute hypoxic respiratory failure in setting of RSV infection.  Has been slowly improving.  Denies any chest pain, lightheadedness, syncope, lower extremity edema, or palpitations.  Smoked 43 years, about 0.5ppd, quit age 21.  Father died of MI at 79.     Past Medical History:  Diagnosis Date   ANTERIOR PITUITARY HYPERFUNCTION 11/23/2009   ANXIETY 11/24/2007   COPD (chronic obstructive pulmonary disease) ([place]) 05/31/2021   GERD 11/24/2007   GYNECOMASTIA 11/10/2009   HYPERLIPIDEMIA 11/24/2007   HYPOTHYROIDISM, POST-RADIATION 03/16/2010    Past Surgical History:  Procedure Laterality Date   INGUINAL HERNIA REPAIR      Current Medications: Current Meds  Medication Sig   albuterol (VENTOLIN HFA) 108 (90 Base) MCG/ACT inhaler Inhale 2 puffs into the lungs every 6 (six) hours as needed for wheezing or shortness of breath.   aspirin 81 MG EC tablet Take 1 tablet (81 mg total) by mouth daily. Swallow whole.   atorvastatin (LIPITOR) 20 MG tablet Take 1 tablet (20 mg total) by mouth daily.   chlorpheniramine-HYDROcodone (TUSSIONEX) 10-8 MG/5ML Take 5 mLs  by mouth every 12 (twelve) hours.   clonazePAM (KLONOPIN) 1 MG tablet Take 1 tablet (1 mg total) by mouth 2 (two) times daily as needed.   fluticasone-salmeterol (ADVAIR) 250-50 MCG/ACT AEPB Inhale 1 puff into the lungs in the morning and at bedtime.   levothyroxine (SYNTHROID) 100 MCG tablet Take 1 tablet (100 mcg total) by mouth daily.   mometasone-formoterol (DULERA) 100-5 MCG/ACT AERO Inhale 2 puffs into the lungs 2 (two) times daily.   oxyCODONE (OXY IR/ROXICODONE) 5 MG immediate release tablet Take 1 tablet (5 mg total) by mouth every 8 (eight) hours as needed for moderate pain.   predniSONE (DELTASONE) 20 MG tablet Take 2 tablets x 5 days   sertraline (ZOLOFT) 100 MG tablet Take 2 tablets (200 mg total) by mouth daily.   triamcinolone (NASACORT) 55 MCG/ACT AERO nasal inhaler Place 2 sprays into the nose daily. (Patient taking differently: Place 2 sprays into the nose daily as needed (allergies).)   varenicline (CHANTIX) 1 MG tablet TAKE 1 TABLET BY MOUTH TWICE A DAY     Allergies:   Atenolol   Social History   Socioeconomic History   Marital status: Married    Spouse name: Not on file   Number of children: 0   Years of education: Not on file   Highest education level: Not on file  Occupational History    Employer: [employer]  Tobacco Use   Smoking status: Former    Packs/day: 1.00    Years: 20.00  Total pack years: 20.00    Types: Cigarettes    Quit date: 08/24/2021    Years since quitting: 0.8   Smokeless tobacco: Never  Substance and Sexual Activity   Alcohol use: Yes    Comment: rarely   Drug use: No   Sexual activity: Not on file  Other Topics Concern   Not on file  Social History Narrative   Not on file   Social Determinants of Health   Financial Resource Strain: Not on file  Food Insecurity: No Food Insecurity (05/14/2022)   Hunger Vital Sign    Worried About Running Out of Food in the Last Year: Never true    Ran Out of Food in the Last Year: Never true   Transportation Needs: No Transportation Needs (05/14/2022)   PRAPARE - Hydrologist (Medical): No    Lack of Transportation (Non-Medical): No  Physical Activity: Not on file  Stress: Not on file  Social Connections: Not on file     Family History: The patient's family history includes COPD in an other family member; Heart disease in his father and another family member; Stroke in an other family member. There is no history of Colon cancer.  ROS:   Please see the history of present illness.     All other systems reviewed and are negative.  EKGs/Labs/Other Studies Reviewed:    The following studies were reviewed today:   EKG:   07/15/2022: Sinus tachycardia, rate 103, right axis deviation, poor R wave progression  Recent Labs: 05/10/2022: ALT 19; B Natriuretic Peptide 63.9 05/11/2022: BUN 22; Creatinine, Ser 0.89; Hemoglobin 13.1; Platelets 227; Potassium 3.7; Sodium 136 05/21/2022: TSH 15.96  Recent Lipid Panel    Component Value Date/Time   CHOL 156 05/21/2022 1628   TRIG 89.0 05/21/2022 1628   HDL 60.50 05/21/2022 1628   CHOLHDL 3 05/21/2022 1628   VLDL 17.8 05/21/2022 1628   LDLCALC 77 05/21/2022 1628    Physical Exam:    VS:  BP 118/64 (BP Location: Left Arm, Patient Position: Sitting, Cuff Size: Normal)   Pulse (!) 103   Ht '5\' 4"'$  (1.626 m)   Wt 170 lb 12.8 oz (77.5 kg)   SpO2 90%   BMI 29.32 kg/m     Wt Readings from Last 3 Encounters:  07/15/22 170 lb 12.8 oz (77.5 kg)  06/21/22 168 lb (76.2 kg)  05/21/22 163 lb (73.9 kg)     GEN:  Well nourished, well developed in no acute distress HEENT: Normal NECK: No JVD; No carotid bruits LYMPHATICS: No lymphadenopathy CARDIAC: RRR, no murmurs, rubs, gallops RESPIRATORY:  Clear to auscultation without rales, wheezing or rhonchi  ABDOMEN: Soft, non-tender, non-distended MUSCULOSKELETAL:  No edema; No deformity  SKIN: Warm and dry NEUROLOGIC:  Alert and oriented x 3 PSYCHIATRIC:   Normal affect   ASSESSMENT:    1. Aortic valve mass   2. Hyperlipidemia, unspecified hyperlipidemia type   3. Tachycardia    PLAN:    Aortic regurgitation: Echocardiogram 07/11/2022 showed EF 60 to 123456, grade 1 diastolic dysfunction, normal RV function, mild to moderate AI, possible small echodensity on aortic valve -Check blood cultures -TEE for further evaluation.  After careful review of history and examination, the risks and benefits of transesophageal echocardiogram have been explained including risks of esophageal damage, perforation (1:10,000 risk), bleeding, pharyngeal hematoma as well as other potential complications associated with conscious sedation including aspiration, arrhythmia, respiratory failure and death. Alternatives to treatment were discussed, questions  were answered. Patient is willing to proceed.    ? Atrial flutter:  EKG 06/21/2022 read as atrial flutter but appears sinus rhythm.  Remains in sinus rhythm in clinic today  Hyperlipidemia: On atorvastatin 20 mg daily  RTC in 1 month  Medication Adjustments/Labs and Tests Ordered: Current medicines are reviewed at length with the patient today.  Concerns regarding medicines are outlined above.  Orders Placed This Encounter  Procedures   Culture, blood (single)   Culture, blood (single)   CBC   Basic Metabolic Panel (BMET)   EKG 12-Lead   No orders of the defined types were placed in this encounter.   Patient Instructions   Testing/Procedures:   You are scheduled for a TEE (Transesophageal Echocardiogram) on Thursday, February 29 with Dr. Johnsie Cancel.  Please arrive at the [facility] (Main Entrance A) at [facility]: [address] [place], [place] [address] at 10:00 AM.    DIET:  Nothing to eat or drink after midnight except a sip of water with medications      LABS: TODAY  FYI:  For your safety, and to allow [place] to monitor your vital signs accurately during the surgery/procedure we request: If  you have artificial nails, gel coating, SNS etc, please have those removed prior to your surgery/procedure. Not having the nail coverings /polish removed may result in cancellation or delay of your surgery/procedure.  You must have a responsible person to drive you home and stay in the waiting area during your procedure. Failure to do so could result in cancellation.  Bring your insurance cards.  *Special Note: Every effort is made to have your procedure done on time. Occasionally there are emergencies that occur at the hospital that may cause delays. Please be patient if a delay does occur.       Follow-Up: At [facility], you and your health needs are our priority.  As part of our continuing mission to provide you with exceptional heart care, we have created designated Provider Care Teams.  These Care Teams include your primary Cardiologist (physician) and Advanced Practice Providers (APPs -  Physician Assistants and Nurse Practitioners) who all work together to provide you with the care you need, when you need it.  We recommend signing up for the patient portal called "MyChart".  Sign up information is provided on this After Visit Summary.  MyChart is used to connect with patients for Virtual Visits (Telemedicine).  Patients are able to view lab/test results, encounter notes, upcoming appointments, etc.  Non-urgent messages can be sent to your provider as well.   To learn more about what you can do with MyChart, go to [email or web address].    Your next appointment:   1 month(s)  Provider:   Oswaldo Milian MD      Signed, Donato Heinz, MD  07/15/2022 11:01 AM    [place]

## 2022-07-14 NOTE — Progress Notes (Unsigned)
Cardiology Office Note:    Date:  07/15/2022   ID:  Christopher Arias, DOB 1960/10/18, MRN [id number]  PCP:  Biagio Borg, MD  Cardiologist:  Donato Heinz, MD  Electrophysiologist:  None   Referring MD: Martyn Ehrich, NP   Chief Complaint  Patient presents with   Cardiac Valve Problem    History of Present Illness:    Christopher Arias is a 62 y.o. male with a hx of COPD on home 3L, hypothyroidism, hyperlipidemia, OSA, rectus sheath hematoma who is referred for evaluation of atrial flutter.  EKG 06/21/2022 read as atrial flutter but appears sinus rhythm.  Echocardiogram 07/11/2022 showed EF 60 to 123456, grade 1 diastolic dysfunction, normal RV function, mild to moderate AI, possible small echodensity on aortic valve on subcostal images.  He reports he has been having shortness of breath since his hospitalization in December 2023 with acute hypoxic respiratory failure in setting of RSV infection.  Has been slowly improving.  Denies any chest pain, lightheadedness, syncope, lower extremity edema, or palpitations.  Smoked 43 years, about 0.5ppd, quit age 39.  Father died of MI at 38.     Past Medical History:  Diagnosis Date   ANTERIOR PITUITARY HYPERFUNCTION 11/23/2009   ANXIETY 11/24/2007   COPD (chronic obstructive pulmonary disease) ([place]) 05/31/2021   GERD 11/24/2007   GYNECOMASTIA 11/10/2009   HYPERLIPIDEMIA 11/24/2007   HYPOTHYROIDISM, POST-RADIATION 03/16/2010    Past Surgical History:  Procedure Laterality Date   INGUINAL HERNIA REPAIR      Current Medications: Current Meds  Medication Sig   albuterol (VENTOLIN HFA) 108 (90 Base) MCG/ACT inhaler Inhale 2 puffs into the lungs every 6 (six) hours as needed for wheezing or shortness of breath.   aspirin 81 MG EC tablet Take 1 tablet (81 mg total) by mouth daily. Swallow whole.   atorvastatin (LIPITOR) 20 MG tablet Take 1 tablet (20 mg total) by mouth daily.   chlorpheniramine-HYDROcodone (TUSSIONEX) 10-8 MG/5ML Take 5 mLs  by mouth every 12 (twelve) hours.   clonazePAM (KLONOPIN) 1 MG tablet Take 1 tablet (1 mg total) by mouth 2 (two) times daily as needed.   fluticasone-salmeterol (ADVAIR) 250-50 MCG/ACT AEPB Inhale 1 puff into the lungs in the morning and at bedtime.   levothyroxine (SYNTHROID) 100 MCG tablet Take 1 tablet (100 mcg total) by mouth daily.   mometasone-formoterol (DULERA) 100-5 MCG/ACT AERO Inhale 2 puffs into the lungs 2 (two) times daily.   oxyCODONE (OXY IR/ROXICODONE) 5 MG immediate release tablet Take 1 tablet (5 mg total) by mouth every 8 (eight) hours as needed for moderate pain.   predniSONE (DELTASONE) 20 MG tablet Take 2 tablets x 5 days   sertraline (ZOLOFT) 100 MG tablet Take 2 tablets (200 mg total) by mouth daily.   triamcinolone (NASACORT) 55 MCG/ACT AERO nasal inhaler Place 2 sprays into the nose daily. (Patient taking differently: Place 2 sprays into the nose daily as needed (allergies).)   varenicline (CHANTIX) 1 MG tablet TAKE 1 TABLET BY MOUTH TWICE A DAY     Allergies:   Atenolol   Social History   Socioeconomic History   Marital status: Married    Spouse name: Not on file   Number of children: 0   Years of education: Not on file   Highest education level: Not on file  Occupational History    Employer: [employer]  Tobacco Use   Smoking status: Former    Packs/day: 1.00    Years: 20.00  Total pack years: 20.00    Types: Cigarettes    Quit date: 08/24/2021    Years since quitting: 0.8   Smokeless tobacco: Never  Substance and Sexual Activity   Alcohol use: Yes    Comment: rarely   Drug use: No   Sexual activity: Not on file  Other Topics Concern   Not on file  Social History Narrative   Not on file   Social Determinants of Health   Financial Resource Strain: Not on file  Food Insecurity: No Food Insecurity (05/14/2022)   Hunger Vital Sign    Worried About Running Out of Food in the Last Year: Never true    Ran Out of Food in the Last Year: Never true   Transportation Needs: No Transportation Needs (05/14/2022)   PRAPARE - Hydrologist (Medical): No    Lack of Transportation (Non-Medical): No  Physical Activity: Not on file  Stress: Not on file  Social Connections: Not on file     Family History: The patient's family history includes COPD in an other family member; Heart disease in his father and another family member; Stroke in an other family member. There is no history of Colon cancer.  ROS:   Please see the history of present illness.     All other systems reviewed and are negative.  EKGs/Labs/Other Studies Reviewed:    The following studies were reviewed today:   EKG:   07/15/2022: Sinus tachycardia, rate 103, right axis deviation, poor R wave progression  Recent Labs: 05/10/2022: ALT 19; B Natriuretic Peptide 63.9 05/11/2022: BUN 22; Creatinine, Ser 0.89; Hemoglobin 13.1; Platelets 227; Potassium 3.7; Sodium 136 05/21/2022: TSH 15.96  Recent Lipid Panel    Component Value Date/Time   CHOL 156 05/21/2022 1628   TRIG 89.0 05/21/2022 1628   HDL 60.50 05/21/2022 1628   CHOLHDL 3 05/21/2022 1628   VLDL 17.8 05/21/2022 1628   LDLCALC 77 05/21/2022 1628    Physical Exam:    VS:  BP 118/64 (BP Location: Left Arm, Patient Position: Sitting, Cuff Size: Normal)   Pulse (!) 103   Ht 5' 4"$  (1.626 m)   Wt 170 lb 12.8 oz (77.5 kg)   SpO2 90%   BMI 29.32 kg/m     Wt Readings from Last 3 Encounters:  07/15/22 170 lb 12.8 oz (77.5 kg)  06/21/22 168 lb (76.2 kg)  05/21/22 163 lb (73.9 kg)     GEN:  Well nourished, well developed in no acute distress HEENT: Normal NECK: No JVD; No carotid bruits LYMPHATICS: No lymphadenopathy CARDIAC: RRR, no murmurs, rubs, gallops RESPIRATORY:  Clear to auscultation without rales, wheezing or rhonchi  ABDOMEN: Soft, non-tender, non-distended MUSCULOSKELETAL:  No edema; No deformity  SKIN: Warm and dry NEUROLOGIC:  Alert and oriented x 3 PSYCHIATRIC:   Normal affect   ASSESSMENT:    1. Aortic valve mass   2. Hyperlipidemia, unspecified hyperlipidemia type   3. Tachycardia    PLAN:    Aortic regurgitation: Echocardiogram 07/11/2022 showed EF 60 to 123456, grade 1 diastolic dysfunction, normal RV function, mild to moderate AI, possible small echodensity on aortic valve -Check blood cultures -TEE for further evaluation.  After careful review of history and examination, the risks and benefits of transesophageal echocardiogram have been explained including risks of esophageal damage, perforation (1:10,000 risk), bleeding, pharyngeal hematoma as well as other potential complications associated with conscious sedation including aspiration, arrhythmia, respiratory failure and death. Alternatives to treatment were discussed, questions  were answered. Patient is willing to proceed.    ? Atrial flutter:  EKG 06/21/2022 read as atrial flutter but appears sinus rhythm.  Remains in sinus rhythm in clinic today  Hyperlipidemia: On atorvastatin 20 mg daily  RTC in 1 month  Medication Adjustments/Labs and Tests Ordered: Current medicines are reviewed at length with the patient today.  Concerns regarding medicines are outlined above.  Orders Placed This Encounter  Procedures   Culture, blood (single)   Culture, blood (single)   CBC   Basic Metabolic Panel (BMET)   EKG 12-Lead   No orders of the defined types were placed in this encounter.   Patient Instructions   Testing/Procedures:   You are scheduled for a TEE (Transesophageal Echocardiogram) on Thursday, February 29 with Dr. Johnsie Cancel.  Please arrive at the [facility] (Main Entrance A) at [facility]: [address] [place], [place] [address] at 10:00 AM.    DIET:  Nothing to eat or drink after midnight except a sip of water with medications      LABS: TODAY  FYI:  For your safety, and to allow [place] to monitor your vital signs accurately during the surgery/procedure we request: If  you have artificial nails, gel coating, SNS etc, please have those removed prior to your surgery/procedure. Not having the nail coverings /polish removed may result in cancellation or delay of your surgery/procedure.  You must have a responsible person to drive you home and stay in the waiting area during your procedure. Failure to do so could result in cancellation.  Bring your insurance cards.  *Special Note: Every effort is made to have your procedure done on time. Occasionally there are emergencies that occur at the hospital that may cause delays. Please be patient if a delay does occur.       Follow-Up: At [facility], you and your health needs are our priority.  As part of our continuing mission to provide you with exceptional heart care, we have created designated Provider Care Teams.  These Care Teams include your primary Cardiologist (physician) and Advanced Practice Providers (APPs -  Physician Assistants and Nurse Practitioners) who all work together to provide you with the care you need, when you need it.  We recommend signing up for the patient portal called "MyChart".  Sign up information is provided on this After Visit Summary.  MyChart is used to connect with patients for Virtual Visits (Telemedicine).  Patients are able to view lab/test results, encounter notes, upcoming appointments, etc.  Non-urgent messages can be sent to your provider as well.   To learn more about what you can do with MyChart, go to [email or web address].    Your next appointment:   1 month(s)  Provider:   Oswaldo Milian MD      Signed, Donato Heinz, MD  07/15/2022 11:01 AM    [place]

## 2022-07-15 ENCOUNTER — Ambulatory Visit: Payer: BC Managed Care – PPO | Attending: Cardiology | Admitting: Cardiology

## 2022-07-15 ENCOUNTER — Encounter: Payer: Self-pay | Admitting: Cardiology

## 2022-07-15 VITALS — BP 118/64 | HR 103 | Ht 64.0 in | Wt 170.8 lb

## 2022-07-15 DIAGNOSIS — E785 Hyperlipidemia, unspecified: Secondary | ICD-10-CM

## 2022-07-15 DIAGNOSIS — I358 Other nonrheumatic aortic valve disorders: Secondary | ICD-10-CM

## 2022-07-15 DIAGNOSIS — R000 Tachycardia, unspecified: Secondary | ICD-10-CM | POA: Diagnosis not present

## 2022-07-15 LAB — CBC

## 2022-07-15 NOTE — Patient Instructions (Signed)
  Testing/Procedures:   You are scheduled for a TEE (Transesophageal Echocardiogram) on Thursday, February 29 with Dr. Johnsie Cancel.  Please arrive at the [facility] (Main Entrance A) at [facility]: [address] [place], [place] [address] at 10:00 AM.    DIET:  Nothing to eat or drink after midnight except a sip of water with medications      LABS: TODAY  FYI:  For your safety, and to allow [place] to monitor your vital signs accurately during the surgery/procedure we request: If you have artificial nails, gel coating, SNS etc, please have those removed prior to your surgery/procedure. Not having the nail coverings /polish removed may result in cancellation or delay of your surgery/procedure.  You must have a responsible person to drive you home and stay in the waiting area during your procedure. Failure to do so could result in cancellation.  Bring your insurance cards.  *Special Note: Every effort is made to have your procedure done on time. Occasionally there are emergencies that occur at the hospital that may cause delays. Please be patient if a delay does occur.       Follow-Up: At [facility], you and your health needs are our priority.  As part of our continuing mission to provide you with exceptional heart care, we have created designated Provider Care Teams.  These Care Teams include your primary Cardiologist (physician) and Advanced Practice Providers (APPs -  Physician Assistants and Nurse Practitioners) who all work together to provide you with the care you need, when you need it.  We recommend signing up for the patient portal called "MyChart".  Sign up information is provided on this After Visit Summary.  MyChart is used to connect with patients for Virtual Visits (Telemedicine).  Patients are able to view lab/test results, encounter notes, upcoming appointments, etc.  Non-urgent messages can be sent to your provider as well.   To learn more about what you can do  with MyChart, go to [email or web address].    Your next appointment:   1 month(s)  Provider:   Oswaldo Milian MD

## 2022-07-16 LAB — CBC
Hematocrit: 45.4 % (ref 37.5–51.0)
Hemoglobin: 15.3 g/dL (ref 13.0–17.7)
MCH: 29.5 pg (ref 26.6–33.0)
MCHC: 33.7 g/dL (ref 31.5–35.7)
MCV: 88 fL (ref 79–97)
Platelets: 164 10*3/uL (ref 150–450)
RBC: 5.19 x10E6/uL (ref 4.14–5.80)
RDW: 13.1 % (ref 11.6–15.4)
WBC: 5.4 10*3/uL (ref 3.4–10.8)

## 2022-07-16 LAB — BASIC METABOLIC PANEL
BUN/Creatinine Ratio: 24 (ref 10–24)
BUN: 22 mg/dL (ref 8–27)
CO2: 22 mmol/L (ref 20–29)
Calcium: 9.3 mg/dL (ref 8.6–10.2)
Chloride: 104 mmol/L (ref 96–106)
Creatinine, Ser: 0.91 mg/dL (ref 0.76–1.27)
Glucose: 99 mg/dL (ref 70–99)
Potassium: 4.7 mmol/L (ref 3.5–5.2)
Sodium: 139 mmol/L (ref 134–144)
eGFR: 96 mL/min/{1.73_m2} (ref >59)

## 2022-07-17 ENCOUNTER — Encounter: Payer: Self-pay | Admitting: *Deleted

## 2022-07-21 LAB — CULTURE, BLOOD (SINGLE)

## 2022-07-23 NOTE — Telephone Encounter (Signed)
Received call from patient - insurance is saying they have not received the requested information from [place].  I called [employer] and representative could see everything we have already faxed to them, including FMLA form signed 06/05/22 and office notes faxed on 2/2.  The recent request is to see if there are any changes to patient's out of work date - [employer] had faxed it to an incorrect fax #.  They will send form again to my attention.

## 2022-07-25 NOTE — Telephone Encounter (Signed)
Patient called to say [employer] is not approving his disability.  While I was on the phone he called [employer] andI spoke to the rep - they are requesting office notes for period Dec 2022 to present.  Rep stated they did receive the updated FMLA form that extended the patient's leave to 10/01/2022, but it was not approved as FMLA.  It is now under review to transition to [employer], which will continue to protect the patient from losing his job due to his absence and before he moves to short term disability.  The rep took notes that the patient will be having a heart procedure next week and will likely be out for longer than 10/01/22.  Also, that future disability documentation will be handled by his cardiologist, Dr. Johnsie Cancel.   Faxed office notes to [facility] fax# [phone].

## 2022-07-26 ENCOUNTER — Telehealth: Payer: Self-pay | Admitting: Primary Care

## 2022-07-26 NOTE — Telephone Encounter (Signed)
Received a fax from [place] asking for updated 34 Statement.  I've completed the form using the info from the patient's 06/21/22 appointment and sent it to E. Volanda Napoleon, NP for review and signature.  When I fax it back to [place], I will once again include all the past office notes and test results.   The return to work date remains as 10/01/2022

## 2022-08-01 ENCOUNTER — Encounter (HOSPITAL_COMMUNITY): Payer: Self-pay | Admitting: Cardiovascular Disease

## 2022-08-01 ENCOUNTER — Ambulatory Visit (HOSPITAL_BASED_OUTPATIENT_CLINIC_OR_DEPARTMENT_OTHER)
Admission: RE | Admit: 2022-08-01 | Discharge: 2022-08-01 | Disposition: A | Discharge status: Home / Self Care | Payer: BC Managed Care – PPO | Source: Ambulatory Visit | Attending: Cardiovascular Disease | Admitting: Cardiovascular Disease

## 2022-08-01 ENCOUNTER — Ambulatory Visit (HOSPITAL_COMMUNITY)
Admission: RE | Admit: 2022-08-01 | Discharge: 2022-08-01 | Disposition: A | Discharge status: Home / Self Care | Payer: BC Managed Care – PPO | Source: Ambulatory Visit | Attending: Cardiovascular Disease | Admitting: Cardiovascular Disease

## 2022-08-01 ENCOUNTER — Ambulatory Visit (HOSPITAL_COMMUNITY): Payer: BC Managed Care – PPO | Admitting: Certified Registered"

## 2022-08-01 ENCOUNTER — Other Ambulatory Visit: Payer: Self-pay

## 2022-08-01 ENCOUNTER — Encounter (HOSPITAL_COMMUNITY)
Admission: RE | Disposition: A | Discharge status: Home / Self Care | Payer: Self-pay | Source: Ambulatory Visit | Attending: Cardiovascular Disease

## 2022-08-01 DIAGNOSIS — I358 Other nonrheumatic aortic valve disorders: Secondary | ICD-10-CM

## 2022-08-01 DIAGNOSIS — G4733 Obstructive sleep apnea (adult) (pediatric): Secondary | ICD-10-CM | POA: Insufficient documentation

## 2022-08-01 DIAGNOSIS — Z87891 Personal history of nicotine dependence: Secondary | ICD-10-CM | POA: Insufficient documentation

## 2022-08-01 DIAGNOSIS — Z79899 Other long term (current) drug therapy: Secondary | ICD-10-CM | POA: Insufficient documentation

## 2022-08-01 DIAGNOSIS — I082 Rheumatic disorders of both aortic and tricuspid valves: Secondary | ICD-10-CM | POA: Diagnosis not present

## 2022-08-01 DIAGNOSIS — J449 Chronic obstructive pulmonary disease, unspecified: Secondary | ICD-10-CM | POA: Insufficient documentation

## 2022-08-01 DIAGNOSIS — E785 Hyperlipidemia, unspecified: Secondary | ICD-10-CM | POA: Diagnosis not present

## 2022-08-01 DIAGNOSIS — G4730 Sleep apnea, unspecified: Secondary | ICD-10-CM | POA: Diagnosis not present

## 2022-08-01 DIAGNOSIS — E039 Hypothyroidism, unspecified: Secondary | ICD-10-CM | POA: Insufficient documentation

## 2022-08-01 DIAGNOSIS — I351 Nonrheumatic aortic (valve) insufficiency: Secondary | ICD-10-CM | POA: Insufficient documentation

## 2022-08-01 DIAGNOSIS — R000 Tachycardia, unspecified: Secondary | ICD-10-CM | POA: Insufficient documentation

## 2022-08-01 DIAGNOSIS — I4892 Unspecified atrial flutter: Secondary | ICD-10-CM | POA: Diagnosis not present

## 2022-08-01 HISTORY — PX: TEE WITHOUT CARDIOVERSION: SHX5443

## 2022-08-01 LAB — ECHO TEE
AR max vel: 2.82 cm2
AV Area VTI: 2.84 cm2
AV Area mean vel: 2.36 cm2
AV Mean grad: 6 mmHg
AV Peak grad: 11.4 mmHg
Ao pk vel: 1.69 m/s
P 1/2 time: 277 msec

## 2022-08-01 SURGERY — ECHOCARDIOGRAM, TRANSESOPHAGEAL
Anesthesia: Monitor Anesthesia Care

## 2022-08-01 MED ORDER — SODIUM CHLORIDE 0.9 % IV SOLN: INTRAVENOUS | Status: DC

## 2022-08-01 MED ORDER — PROPOFOL 500 MG/50ML IV EMUL
INTRAVENOUS | Status: DC | PRN
  Administered 2022-08-01: 200 ug/kg/min via INTRAVENOUS

## 2022-08-01 MED ORDER — GLYCOPYRROLATE 0.2 MG/ML IJ SOLN
INTRAMUSCULAR | Status: DC | PRN
  Administered 2022-08-01: .1 mg via INTRAVENOUS

## 2022-08-01 MED ORDER — PROPOFOL 10 MG/ML IV BOLUS
INTRAVENOUS | Status: DC | PRN
  Administered 2022-08-01: 20 mg via INTRAVENOUS
  Administered 2022-08-01: 30 mg via INTRAVENOUS
  Administered 2022-08-01: 10 mg via INTRAVENOUS
  Administered 2022-08-01: 20 mg via INTRAVENOUS

## 2022-08-01 MED ORDER — LIDOCAINE HCL (CARDIAC) PF 100 MG/5ML IV SOSY
PREFILLED_SYRINGE | INTRAVENOUS | Status: DC | PRN
  Administered 2022-08-01: 60 mg via INTRAVENOUS
  Administered 2022-08-01: 20 mg via INTRAVENOUS

## 2022-08-01 MED ORDER — LACTATED RINGERS IV SOLN: INTRAVENOUS | Status: DC

## 2022-08-01 NOTE — Progress Notes (Signed)
Echocardiogram Echocardiogram Transesophageal has been performed.  Christopher Arias 08/01/2022, 11:34 AM

## 2022-08-01 NOTE — Transfer of Care (Signed)
Immediate Anesthesia Transfer of Care Note  Patient: Christopher Arias  Procedure(s) Performed: TRANSESOPHAGEAL ECHOCARDIOGRAM (TEE)  Patient Location: PACU and Endoscopy Unit  Anesthesia Type:MAC  Level of Consciousness: drowsy  Airway & Oxygen Therapy: Patient Spontanous Breathing and Patient connected to nasal cannula oxygen  Post-op Assessment: Report given to RN and Post -op Vital signs reviewed and stable  Post vital signs: Reviewed and stable  Last Vitals:  Vitals Value Taken Time  BP 97/64 08/01/22 1113  Temp    Pulse 82 08/01/22 1119  Resp 23 08/01/22 1119  SpO2 94 % 08/01/22 1119  Vitals shown include unvalidated device data.  Last Pain:  Vitals:   08/01/22 1009  TempSrc: Temporal  PainSc: 0-No pain         Complications: No notable events documented.

## 2022-08-01 NOTE — Interval H&P Note (Signed)
History and Physical Interval Note:  08/01/2022 10:22 AM  Christopher Arias  has presented today for surgery, with the diagnosis of [place].  The various methods of treatment have been discussed with the patient and family. After consideration of risks, benefits and other options for treatment, the patient has consented to  Procedure(s): TRANSESOPHAGEAL ECHOCARDIOGRAM (TEE) (N/A) as a surgical intervention.  The patient's history has been reviewed, patient examined, no change in status, stable for surgery.  I have reviewed the patient's chart and labs.  Questions were answered to the patient's satisfaction.     Jenkins Rouge

## 2022-08-01 NOTE — Discharge Instructions (Signed)

## 2022-08-01 NOTE — CV Procedure (Signed)
TEE:  Bicuspid AV Sievers type 1 with raphe 4:00 between right/left cusps Severe AR with ERO 0.52 cm2 RV 67 cc AR PISA radius 1.0 cm and holodiastolic reversals in ascending thoracic aorta  Moderately dilated aortic sinuses 3.98 cm EF 60-65% Normal MV Normal RV No ASD/PFO No effusion   See full report in [place]  Jenkins Rouge MD [facility]

## 2022-08-01 NOTE — Anesthesia Preprocedure Evaluation (Signed)
Anesthesia Evaluation    Airway Mallampati: II  TM Distance: >3 FB Neck ROM: Full    Dental no notable dental hx.    Pulmonary asthma , sleep apnea , COPD, Patient abstained from smoking., former smoker   Pulmonary exam normal breath sounds clear to auscultation       Cardiovascular Normal cardiovascular exam Rhythm:Regular Rate:Normal     Neuro/Psych   Anxiety Depression       GI/Hepatic ,GERD  ,,  Endo/Other  Hypothyroidism    Renal/GU      Musculoskeletal   Abdominal   Peds  Hematology   Anesthesia Other Findings   Reproductive/Obstetrics                             Anesthesia Physical Anesthesia Plan  ASA: 3  Anesthesia Plan: MAC   Post-op Pain Management:    Induction: Intravenous  PONV Risk Score and Plan: 1 and Ondansetron and Treatment may vary due to age or medical condition  Airway Management Planned: Nasal Cannula  Additional Equipment:   Intra-op Plan:   Post-operative Plan:   Informed Consent: I have reviewed the patients History and Physical, chart, labs and discussed the procedure including the risks, benefits and alternatives for the proposed anesthesia with the patient or authorized representative who has indicated his/her understanding and acceptance.     Dental advisory given  Plan Discussed with: CRNA  Anesthesia Plan Comments:        Anesthesia Quick Evaluation

## 2022-08-02 NOTE — Anesthesia Postprocedure Evaluation (Signed)
Anesthesia Post Note  Patient: Christopher Arias  Procedure(s) Performed: TRANSESOPHAGEAL ECHOCARDIOGRAM (TEE)     Patient location during evaluation: PACU Anesthesia Type: MAC Level of consciousness: awake and alert Pain management: pain level controlled Vital Signs Assessment: post-procedure vital signs reviewed and stable Respiratory status: spontaneous breathing, nonlabored ventilation and respiratory function stable Cardiovascular status: blood pressure returned to baseline and stable Postop Assessment: no apparent nausea or vomiting Anesthetic complications: no   No notable events documented.  Last Vitals:  Vitals:   08/01/22 1130 08/01/22 1140  BP: (!) 112/93 124/82  Pulse: 86 80  Resp: 19 14  Temp:    SpO2: 97% 96%    Last Pain:  Vitals:   08/01/22 1140  TempSrc:   PainSc: 0-No pain                 Lynda Rainwater

## 2022-08-04 ENCOUNTER — Encounter (HOSPITAL_COMMUNITY): Payer: Self-pay | Admitting: Cardiovascular Disease

## 2022-08-06 NOTE — Telephone Encounter (Signed)
Physician's Statement signed by Derl Barrow, NP on 2/26 and faxed today to [place], attn: London Sheer   fax# [phone].   Hard copy mailed to patient.

## 2022-08-09 ENCOUNTER — Other Ambulatory Visit (HOSPITAL_BASED_OUTPATIENT_CLINIC_OR_DEPARTMENT_OTHER): Payer: Self-pay

## 2022-08-09 DIAGNOSIS — J449 Chronic obstructive pulmonary disease, unspecified: Secondary | ICD-10-CM

## 2022-08-11 NOTE — Progress Notes (Addendum)
Cardiology Office Note:    Date:  08/13/2022   ID:  Christopher Arias, DOB June 27, 1960, MRN [id number]  PCP:  Biagio Borg, MD  Cardiologist:  Donato Heinz, MD  Electrophysiologist:  None   Referring MD: Biagio Borg, MD   Chief Complaint  Patient presents with   Aortic regurgitation    History of Present Illness:    Christopher Arias is a 62 y.o. male with a hx of COPD on home 3L, hypothyroidism, hyperlipidemia, OSA, rectus sheath hematoma who presents for follow-up.  He was referred for evaluation of possible atrial flutter, initially seen 07/15/2022.  EKG 06/21/2022 read as atrial flutter but appears sinus rhythm.  Echocardiogram 07/11/2022 showed EF 60 to 123456, grade 1 diastolic dysfunction, normal RV function, mild to moderate AI, possible small echodensity on aortic valve on subcostal images.  He reports he has been having shortness of breath since his hospitalization in December 2023 with acute hypoxic respiratory failure in setting of RSV infection.  Has been slowly improving.  Denies any chest pain, lightheadedness, syncope, lower extremity edema, or palpitations.  Smoked 43 years, about 0.5ppd, quit age 45.  Father died of MI at 36.   TEE on 08/09/2022 showed EF 60 to 65%, mild LV dilatation, normal RV function, mild left atrial enlargement, bicuspid aortic valve with severe aortic regurgitation, dilated aortic root measuring 40 mm.  Since last clinic visit, he reports he is doing okay.  Denies any chest pain, lightheadedness, syncope, lower extremity edema, or palpitations.  He does report has been having dyspnea with exertion.  States that he felt short of breath walking and pulmonologist office yesterday and O2 dropped to high 80s.   Past Medical History:  Diagnosis Date   ANTERIOR PITUITARY HYPERFUNCTION 11/23/2009   ANXIETY 11/24/2007   COPD (chronic obstructive pulmonary disease) ([place]) 05/31/2021   GERD 11/24/2007   GYNECOMASTIA 11/10/2009   HYPERLIPIDEMIA 11/24/2007    HYPOTHYROIDISM, POST-RADIATION 03/16/2010    Past Surgical History:  Procedure Laterality Date   INGUINAL HERNIA REPAIR     TEE WITHOUT CARDIOVERSION N/A 08-09-2022   Procedure: TRANSESOPHAGEAL ECHOCARDIOGRAM (TEE);  Surgeon: Josue Hector, MD;  Location: [facility] ENDOSCOPY;  Service: Cardiovascular;  Laterality: N/A;    Current Medications: Current Meds  Medication Sig   albuterol (VENTOLIN HFA) 108 (90 Base) MCG/ACT inhaler Inhale 2 puffs into the lungs every 6 (six) hours as needed for wheezing or shortness of breath.   aspirin 81 MG EC tablet Take 1 tablet (81 mg total) by mouth daily. Swallow whole.   atorvastatin (LIPITOR) 20 MG tablet Take 1 tablet (20 mg total) by mouth daily.   Budeson-Glycopyrrol-Formoterol (BREZTRI AEROSPHERE) 160-9-4.8 MCG/ACT AERO Inhale 2 puffs into the lungs in the morning and at bedtime.   clonazePAM (KLONOPIN) 1 MG tablet Take 1 tablet (1 mg total) by mouth 2 (two) times daily as needed.   diphenhydramine-acetaminophen (TYLENOL PM) 25-500 MG TABS tablet Take 2 tablets by mouth at bedtime.   levothyroxine (SYNTHROID) 100 MCG tablet Take 1 tablet (100 mcg total) by mouth daily.   OXYGEN Inhale 2-3 L into the lungs continuous as needed (shortness of breath).   sertraline (ZOLOFT) 100 MG tablet Take 2 tablets (200 mg total) by mouth daily.   triamcinolone (NASACORT) 55 MCG/ACT AERO nasal inhaler Place 2 sprays into the nose daily. (Patient taking differently: Place 2 sprays into the nose daily as needed (allergies).)   varenicline (CHANTIX) 1 MG tablet TAKE 1 TABLET BY MOUTH TWICE A  DAY     Allergies:   Atenolol   Social History   Socioeconomic History   Marital status: Married    Spouse name: Not on file   Number of children: 0   Years of education: Not on file   Highest education level: Not on file  Occupational History    Employer: [employer]  Tobacco Use   Smoking status: Former    Packs/day: 1.00    Years: 20.00    Total pack years: 20.00    Types:  Cigarettes    Quit date: 08/24/2021    Years since quitting: 0.9   Smokeless tobacco: Never  Substance and Sexual Activity   Alcohol use: Yes    Comment: rarely   Drug use: No   Sexual activity: Not on file  Other Topics Concern   Not on file  Social History Narrative   Not on file   Social Determinants of Health   Financial Resource Strain: Not on file  Food Insecurity: No Food Insecurity (05/14/2022)   Hunger Vital Sign    Worried About Running Out of Food in the Last Year: Never true    Ran Out of Food in the Last Year: Never true  Transportation Needs: No Transportation Needs (05/14/2022)   PRAPARE - Hydrologist (Medical): No    Lack of Transportation (Non-Medical): No  Physical Activity: Not on file  Stress: Not on file  Social Connections: Not on file     Family History: The patient's family history includes COPD in an other family member; Heart disease in his father and another family member; Stroke in an other family member. There is no history of Colon cancer.  ROS:   Please see the history of present illness.     All other systems reviewed and are negative.  EKGs/Labs/Other Studies Reviewed:    The following studies were reviewed today:   EKG:   07/15/2022: Sinus tachycardia, rate 103, right axis deviation, poor R wave progression 08/13/22: Sinus tachycardia, rate 102, first-degree AV block, right axis deviation, Q waves in V1/2, poor R wave progression  Recent Labs: 05/10/2022: ALT 19; B Natriuretic Peptide 63.9 05/21/2022: TSH 15.96 07/15/2022: BUN 22; Creatinine, Ser 0.91; Hemoglobin 15.3; Platelets 164; Potassium 4.7; Sodium 139  Recent Lipid Panel    Component Value Date/Time   CHOL 156 05/21/2022 1628   TRIG 89.0 05/21/2022 1628   HDL 60.50 05/21/2022 1628   CHOLHDL 3 05/21/2022 1628   VLDL 17.8 05/21/2022 1628   LDLCALC 77 05/21/2022 1628    Physical Exam:    VS:  BP (!) 142/72   Pulse (!) 102   Ht '5\' 4"'$  (1.626  m)   Wt 169 lb 3.2 oz (76.7 kg)   SpO2 94%   BMI 29.04 kg/m     Wt Readings from Last 3 Encounters:  08/13/22 169 lb 3.2 oz (76.7 kg)  08/12/22 170 lb 6.4 oz (77.3 kg)  08/01/22 145 lb (65.8 kg)     GEN:  Well nourished, well developed in no acute distress HEENT: Normal NECK: No JVD; No carotid bruits CARDIAC: RRR, no murmurs, rubs, gallops RESPIRATORY:  Clear to auscultation without rales, wheezing or rhonchi  ABDOMEN: Soft, non-tender, non-distended MUSCULOSKELETAL:  No edema; No deformity  SKIN: Warm and dry NEUROLOGIC:  Alert and oriented x 3 PSYCHIATRIC:  Normal affect   ASSESSMENT:    1. Aortic valve insufficiency, etiology of cardiac valve disease unspecified   2. Hyperlipidemia, unspecified hyperlipidemia type  PLAN:    Aortic regurgitation: Echocardiogram 07/11/2022 showed EF 60 to 123456, grade 1 diastolic dysfunction, normal RV function, mild to moderate AI, possible small echodensity on aortic valve.  Blood cultures were checked and were negative.  TEE on 08/01/2022 showed EF 60 to 65%, mild LV dilatation, normal RV function, mild left atrial enlargement, bicuspid aortic valve with severe aortic regurgitation, dilated aortic root measuring 40 mm. -He is reporting dyspnea on exertion, which may be partially due to his COPD but concerned that severe aortic regurgitation is contributing to his symptoms.  Recommend referral to cardiac surgery for evaluation for AVR -Plan LHC/RHC.  Risks and benefits of cardiac catheterization have been discussed with the patient.  These include bleeding, infection, kidney damage, stroke, heart attack, death.  The patient understands these risks and is willing to proceed.   Hyperlipidemia: On atorvastatin 20 mg daily  RTC in 3 months  Medication Adjustments/Labs and Tests Ordered: Current medicines are reviewed at length with the patient today.  Concerns regarding medicines are outlined above.  Orders Placed This Encounter  Procedures    Ambulatory referral to Cardiothoracic Surgery   EKG 12-Lead   No orders of the defined types were placed in this encounter.   Patient Instructions  Medication Instructions:  NO CHANGES  *If you need a refill on your cardiac medications before your next appointment, please call your pharmacy*   Follow-Up: At [facility], you and your health needs are our priority.  As part of our continuing mission to provide you with exceptional heart care, we have created designated Provider Care Teams.  These Care Teams include your primary Cardiologist (physician) and Advanced Practice Providers (APPs -  Physician Assistants and Nurse Practitioners) who all work together to provide you with the care you need, when you need it.  We recommend signing up for the patient portal called "MyChart".  Sign up information is provided on this After Visit Summary.  MyChart is used to connect with patients for Virtual Visits (Telemedicine).  Patients are able to view lab/test results, encounter notes, upcoming appointments, etc.  Non-urgent messages can be sent to your provider as well.   To learn more about what you can do with MyChart, go to [email or web address].    Your next appointment:   3 month(s)  Provider:   Donato Heinz, MD      Other Instructions You have been referred to Cardiothoracic Surgery      Signed, Donato Heinz, MD  08/13/2022 3:41 PM    [place]

## 2022-08-11 NOTE — H&P (View-Only) (Signed)
Cardiology Office Note:    Date:  08/13/2022   ID:  Christopher Arias, DOB June 27, 1960, MRN [id number]  PCP:  Christopher Borg, MD  Cardiologist:  Christopher Heinz, MD  Electrophysiologist:  None   Referring MD: Christopher Borg, MD   Chief Complaint  Patient presents with   Aortic regurgitation    History of Present Illness:    Christopher Arias is a 62 y.o. male with a hx of COPD on home 3L, hypothyroidism, hyperlipidemia, OSA, rectus sheath hematoma who presents for follow-up.  He was referred for evaluation of possible atrial flutter, initially seen 07/15/2022.  EKG 06/21/2022 read as atrial flutter but appears sinus rhythm.  Echocardiogram 07/11/2022 showed EF 60 to 123456, grade 1 diastolic dysfunction, normal RV function, mild to moderate AI, possible small echodensity on aortic valve on subcostal images.  He reports he has been having shortness of breath since his hospitalization in December 2023 with acute hypoxic respiratory failure in setting of RSV infection.  Has been slowly improving.  Denies any chest pain, lightheadedness, syncope, lower extremity edema, or palpitations.  Smoked 43 years, about 0.5ppd, quit age 45.  Father died of MI at 36.   TEE on 08/09/2022 showed EF 60 to 65%, mild LV dilatation, normal RV function, mild left atrial enlargement, bicuspid aortic valve with severe aortic regurgitation, dilated aortic root measuring 40 mm.  Since last clinic visit, he reports he is doing okay.  Denies any chest pain, lightheadedness, syncope, lower extremity edema, or palpitations.  He does report has been having dyspnea with exertion.  States that he felt short of breath walking and pulmonologist office yesterday and O2 dropped to high 80s.   Past Medical History:  Diagnosis Date   ANTERIOR PITUITARY HYPERFUNCTION 11/23/2009   ANXIETY 11/24/2007   COPD (chronic obstructive pulmonary disease) ([place]) 05/31/2021   GERD 11/24/2007   GYNECOMASTIA 11/10/2009   HYPERLIPIDEMIA 11/24/2007    HYPOTHYROIDISM, POST-RADIATION 03/16/2010    Past Surgical History:  Procedure Laterality Date   INGUINAL HERNIA REPAIR     TEE WITHOUT CARDIOVERSION N/A 08-09-2022   Procedure: TRANSESOPHAGEAL ECHOCARDIOGRAM (TEE);  Surgeon: Josue Hector, MD;  Location: [facility] ENDOSCOPY;  Service: Cardiovascular;  Laterality: N/A;    Current Medications: Current Meds  Medication Sig   albuterol (VENTOLIN HFA) 108 (90 Base) MCG/ACT inhaler Inhale 2 puffs into the lungs every 6 (six) hours as needed for wheezing or shortness of breath.   aspirin 81 MG EC tablet Take 1 tablet (81 mg total) by mouth daily. Swallow whole.   atorvastatin (LIPITOR) 20 MG tablet Take 1 tablet (20 mg total) by mouth daily.   Budeson-Glycopyrrol-Formoterol (BREZTRI AEROSPHERE) 160-9-4.8 MCG/ACT AERO Inhale 2 puffs into the lungs in the morning and at bedtime.   clonazePAM (KLONOPIN) 1 MG tablet Take 1 tablet (1 mg total) by mouth 2 (two) times daily as needed.   diphenhydramine-acetaminophen (TYLENOL PM) 25-500 MG TABS tablet Take 2 tablets by mouth at bedtime.   levothyroxine (SYNTHROID) 100 MCG tablet Take 1 tablet (100 mcg total) by mouth daily.   OXYGEN Inhale 2-3 L into the lungs continuous as needed (shortness of breath).   sertraline (ZOLOFT) 100 MG tablet Take 2 tablets (200 mg total) by mouth daily.   triamcinolone (NASACORT) 55 MCG/ACT AERO nasal inhaler Place 2 sprays into the nose daily. (Patient taking differently: Place 2 sprays into the nose daily as needed (allergies).)   varenicline (CHANTIX) 1 MG tablet TAKE 1 TABLET BY MOUTH TWICE A  DAY     Allergies:   Atenolol   Social History   Socioeconomic History   Marital status: Married    Spouse name: Not on file   Number of children: 0   Years of education: Not on file   Highest education level: Not on file  Occupational History    Employer: [employer]  Tobacco Use   Smoking status: Former    Packs/day: 1.00    Years: 20.00    Total pack years: 20.00    Types:  Cigarettes    Quit date: 08/24/2021    Years since quitting: 0.9   Smokeless tobacco: Never  Substance and Sexual Activity   Alcohol use: Yes    Comment: rarely   Drug use: No   Sexual activity: Not on file  Other Topics Concern   Not on file  Social History Narrative   Not on file   Social Determinants of Health   Financial Resource Strain: Not on file  Food Insecurity: No Food Insecurity (05/14/2022)   Hunger Vital Sign    Worried About Running Out of Food in the Last Year: Never true    Ran Out of Food in the Last Year: Never true  Transportation Needs: No Transportation Needs (05/14/2022)   PRAPARE - Hydrologist (Medical): No    Lack of Transportation (Non-Medical): No  Physical Activity: Not on file  Stress: Not on file  Social Connections: Not on file     Family History: The patient's family history includes COPD in an other family member; Heart disease in his father and another family member; Stroke in an other family member. There is no history of Colon cancer.  ROS:   Please see the history of present illness.     All other systems reviewed and are negative.  EKGs/Labs/Other Studies Reviewed:    The following studies were reviewed today:   EKG:   07/15/2022: Sinus tachycardia, rate 103, right axis deviation, poor R wave progression 08/13/22: Sinus tachycardia, rate 102, first-degree AV block, right axis deviation, Q waves in V1/2, poor R wave progression  Recent Labs: 05/10/2022: ALT 19; B Natriuretic Peptide 63.9 05/21/2022: TSH 15.96 07/15/2022: BUN 22; Creatinine, Ser 0.91; Hemoglobin 15.3; Platelets 164; Potassium 4.7; Sodium 139  Recent Lipid Panel    Component Value Date/Time   CHOL 156 05/21/2022 1628   TRIG 89.0 05/21/2022 1628   HDL 60.50 05/21/2022 1628   CHOLHDL 3 05/21/2022 1628   VLDL 17.8 05/21/2022 1628   LDLCALC 77 05/21/2022 1628    Physical Exam:    VS:  BP (!) 142/72   Pulse (!) 102   Ht '5\' 4"'$  (1.626  m)   Wt 169 lb 3.2 oz (76.7 kg)   SpO2 94%   BMI 29.04 kg/m     Wt Readings from Last 3 Encounters:  08/13/22 169 lb 3.2 oz (76.7 kg)  08/12/22 170 lb 6.4 oz (77.3 kg)  08/01/22 145 lb (65.8 kg)     GEN:  Well nourished, well developed in no acute distress HEENT: Normal NECK: No JVD; No carotid bruits CARDIAC: RRR, no murmurs, rubs, gallops RESPIRATORY:  Clear to auscultation without rales, wheezing or rhonchi  ABDOMEN: Soft, non-tender, non-distended MUSCULOSKELETAL:  No edema; No deformity  SKIN: Warm and dry NEUROLOGIC:  Alert and oriented x 3 PSYCHIATRIC:  Normal affect   ASSESSMENT:    1. Aortic valve insufficiency, etiology of cardiac valve disease unspecified   2. Hyperlipidemia, unspecified hyperlipidemia type  PLAN:    Aortic regurgitation: Echocardiogram 07/11/2022 showed EF 60 to 123456, grade 1 diastolic dysfunction, normal RV function, mild to moderate AI, possible small echodensity on aortic valve.  Blood cultures were checked and were negative.  TEE on 08/01/2022 showed EF 60 to 65%, mild LV dilatation, normal RV function, mild left atrial enlargement, bicuspid aortic valve with severe aortic regurgitation, dilated aortic root measuring 40 mm. -He is reporting dyspnea on exertion, which may be partially due to his COPD but concerned that severe aortic regurgitation is contributing to his symptoms.  Recommend referral to cardiac surgery for evaluation for AVR -Plan LHC/RHC.  Risks and benefits of cardiac catheterization have been discussed with the patient.  These include bleeding, infection, kidney damage, stroke, heart attack, death.  The patient understands these risks and is willing to proceed.   Hyperlipidemia: On atorvastatin 20 mg daily  RTC in 3 months  Medication Adjustments/Labs and Tests Ordered: Current medicines are reviewed at length with the patient today.  Concerns regarding medicines are outlined above.  Orders Placed This Encounter  Procedures    Ambulatory referral to Cardiothoracic Surgery   EKG 12-Lead   No orders of the defined types were placed in this encounter.   Patient Instructions  Medication Instructions:  NO CHANGES  *If you need a refill on your cardiac medications before your next appointment, please call your pharmacy*   Follow-Up: At [facility], you and your health needs are our priority.  As part of our continuing mission to provide you with exceptional heart care, we have created designated Provider Care Teams.  These Care Teams include your primary Cardiologist (physician) and Advanced Practice Providers (APPs -  Physician Assistants and Nurse Practitioners) who all work together to provide you with the care you need, when you need it.  We recommend signing up for the patient portal called "MyChart".  Sign up information is provided on this After Visit Summary.  MyChart is used to connect with patients for Virtual Visits (Telemedicine).  Patients are able to view lab/test results, encounter notes, upcoming appointments, etc.  Non-urgent messages can be sent to your provider as well.   To learn more about what you can do with MyChart, go to [email or web address].    Your next appointment:   3 month(s)  Provider:   Donato Heinz, MD      Other Instructions You have been referred to Cardiothoracic Surgery      Signed, Christopher Heinz, MD  08/13/2022 3:41 PM    [place]

## 2022-08-12 ENCOUNTER — Ambulatory Visit: Payer: BC Managed Care – PPO | Admitting: Primary Care

## 2022-08-12 ENCOUNTER — Telehealth: Payer: Self-pay | Admitting: Primary Care

## 2022-08-12 ENCOUNTER — Ambulatory Visit (INDEPENDENT_AMBULATORY_CARE_PROVIDER_SITE_OTHER): Payer: BC Managed Care – PPO | Admitting: Pulmonary Disease

## 2022-08-12 ENCOUNTER — Encounter: Payer: Self-pay | Admitting: Primary Care

## 2022-08-12 VITALS — BP 110/62 | HR 111 | Temp 97.7°F | Ht 65.0 in | Wt 170.4 lb

## 2022-08-12 DIAGNOSIS — I351 Nonrheumatic aortic (valve) insufficiency: Secondary | ICD-10-CM | POA: Diagnosis not present

## 2022-08-12 DIAGNOSIS — J449 Chronic obstructive pulmonary disease, unspecified: Secondary | ICD-10-CM

## 2022-08-12 DIAGNOSIS — J9601 Acute respiratory failure with hypoxia: Secondary | ICD-10-CM

## 2022-08-12 DIAGNOSIS — J210 Acute bronchiolitis due to respiratory syncytial virus: Secondary | ICD-10-CM | POA: Diagnosis not present

## 2022-08-12 LAB — PULMONARY FUNCTION TEST
DL/VA % pred: 37 %
DL/VA: 1.61 ml/min/mmHg/L
DLCO cor % pred: 36 %
DLCO cor: 8.65 ml/min/mmHg
DLCO unc % pred: 37 %
DLCO unc: 8.82 ml/min/mmHg
FEF 25-75 Post: 0.76 L/sec
FEF 25-75 Pre: 0.76 L/sec
FEF2575-%Change-Post: 0 %
FEF2575-%Pred-Post: 30 %
FEF2575-%Pred-Pre: 30 %
FEV1-%Change-Post: 4 %
FEV1-%Pred-Post: 62 %
FEV1-%Pred-Pre: 60 %
FEV1-Post: 1.86 L
FEV1-Pre: 1.79 L
FEV1FVC-%Change-Post: 0 %
FEV1FVC-%Pred-Pre: 68 %
FEV6-%Change-Post: 7 %
FEV6-%Pred-Post: 90 %
FEV6-%Pred-Pre: 84 %
FEV6-Post: 3.38 L
FEV6-Pre: 3.16 L
FEV6FVC-%Change-Post: 0 %
FEV6FVC-%Pred-Post: 98 %
FEV6FVC-%Pred-Pre: 98 %
FVC-%Change-Post: 5 %
FVC-%Pred-Post: 92 %
FVC-%Pred-Pre: 88 %
FVC-Post: 3.64 L
FVC-Pre: 3.46 L
Post FEV1/FVC ratio: 51 %
Post FEV6/FVC ratio: 93 %
Pre FEV1/FVC ratio: 52 %
Pre FEV6/FVC Ratio: 94 %
RV % pred: 149 %
RV: 2.97 L
TLC % pred: 116 %
TLC: 7 L

## 2022-08-12 MED ORDER — BREZTRI AEROSPHERE 160-9-4.8 MCG/ACT IN AERO: 2.0000 | INHALATION_SPRAY | Freq: Two times a day (BID) | RESPIRATORY_TRACT | 5 refills | Status: DC

## 2022-08-12 NOTE — Patient Instructions (Addendum)
Pulmonary function testing today showed you have moderate obstructive lung disease or COPD stage 2  Recommendations: Continue to wear 1-2L oxygen with moderate exertion and at bedtime  Continue Breztri aerosphere two puffs morning and evening   Orders: Ambulatory walk test on room air (titrate O2 accordingly to maintain >88-90%)  Follow-up: 3 months with either Dr. Halford Chessman in [place] or needs to establish with new [facility] sleep/pulmonary provider (Either Dr. Annamaria Boots or Dr. Ander Slade) here in [place] (30 min)

## 2022-08-12 NOTE — Progress Notes (Signed)
Full PFT Performed Today  

## 2022-08-12 NOTE — Assessment & Plan Note (Addendum)
-   Patient experiences moderate dyspnea symptoms with exertion and intermittent wheezing. Pulmonary function testing today showed moderate obstructive airway disease with severe diffusion impairment at 36%. Breathing improved with trial Breztri, plan to continue triple therapy regimen. CTA 05/10/22 negative for PE, showed bronchial thickening and mucus plugs. Encourage mucinex and flutter valve as needed for chest congestion/cough.

## 2022-08-12 NOTE — Patient Instructions (Signed)
Full PFT Performed Today  

## 2022-08-12 NOTE — Telephone Encounter (Signed)
Need to extend patients short term disability through end of April. He may apply for long term d/t moderate COPD, emphysema, chronic respiratory failure and valve insufficiency- if you get paperwork please let me know

## 2022-08-12 NOTE — Progress Notes (Signed)
$'@Patient'E$  ID: Christopher Arias, male    DOB: [birth year], 62 y.o.   MRN: [id number]  Chief Complaint  Patient presents with   Follow-up    Review PFT from today.      Referring provider: Biagio Borg, MD  HPI: 62 year old male, former smoker. PMH significant for OSA, COPD, GERD, hypothyroidism, hyperlipidemia, rectus sheath hematoma.  08/12/2022 Patient presents today for 2 month follow-up with PFTs.   He was hospitalized for RSV in December 2023.  CTA negative for PE, bibasilar bronchial wall thickening and lower lobe atelectasis.  Patient with new oxygen requirements 2 to 4 L to maintain O2 greater than 92%.  Patient had incidental findings of a large rectus sheath hematoma in the right upper quadrant from severe coughing, no intervention necessary and expected to self resolve.  He is doing a lot better since our last visit. He had another recent viral respiratory respiratory infection 2 weeks ago that was temporary and resolved on its own. He reports having a congested cough that has since resolved. Dyspneda symptoms are baseline, he experiences some shortness of breath but overall this has improved. He has intermittent wheedzing. He noticed improvement in his breathing with [employer]. He has not needed to use Albuterol rescue inhaler much the last month, average 1-2 times a week. Remains on supplemental oxygen. He is using 3L oxygen intermittently with exertion and at bedtime. He is no longer having right sided pain from previous rectus sheath hematoma. Pulmonary function testing today showed moderate obstructive defect. He had an echocardiogram in February 2024 d/t tachycardia/possible flutter that showed aortic insufficiency, he underwent TEE that showed severe eccentric AR. He will be seeing Dr. Johnsie Cancel. Referred to pulmonary rehab.   Pulmonary function testing 08/12/2022 PFTs>>> FVC 3.64 (92%), FEV1 1.86 (62%), ratio 51, TLC 116%, DLCOcor 8.65 (36%) Moderate obstruction with severe  diffusion impairment    Allergies  Allergen Reactions   Atenolol Other (See Comments)    Nose bleeds    Immunization History  Administered Date(s) Administered   H1N1 07/19/2008   Influenza,inj,Quad PF,6+ Mos 03/12/2013, 06/17/2014, 03/08/2019, 05/30/2021, 05/21/2022   PFIZER(Purple Top)SARS-COV-2 Vaccination 08/30/2019, 09/20/2019   Td 07/19/2008   Tdap 09/13/2012   Zoster Recombinat (Shingrix) 11/27/2020, 01/31/2021    Past Medical History:  Diagnosis Date   ANTERIOR PITUITARY HYPERFUNCTION 11/23/2009   ANXIETY 11/24/2007   COPD (chronic obstructive pulmonary disease) ([place]) 05/31/2021   GERD 11/24/2007   GYNECOMASTIA 11/10/2009   HYPERLIPIDEMIA 11/24/2007   HYPOTHYROIDISM, POST-RADIATION 03/16/2010    Tobacco History: Social History   Tobacco Use  Smoking Status Former   Packs/day: 1.00   Years: 20.00   Total pack years: 20.00   Types: Cigarettes   Quit date: 08/24/2021   Years since quitting: 0.9  Smokeless Tobacco Never   Counseling given: Not Answered   Outpatient Medications Prior to Visit  Medication Sig Dispense Refill   albuterol (VENTOLIN HFA) 108 (90 Base) MCG/ACT inhaler Inhale 2 puffs into the lungs every 6 (six) hours as needed for wheezing or shortness of breath. 18 g 3   aspirin 81 MG EC tablet Take 1 tablet (81 mg total) by mouth daily. Swallow whole. 30 tablet 12   atorvastatin (LIPITOR) 20 MG tablet Take 1 tablet (20 mg total) by mouth daily. 90 tablet 3   clonazePAM (KLONOPIN) 1 MG tablet Take 1 tablet (1 mg total) by mouth 2 (two) times daily as needed. 60 tablet 2   diphenhydramine-acetaminophen (TYLENOL PM) 25-500 MG TABS  tablet Take 2 tablets by mouth at bedtime.     levothyroxine (SYNTHROID) 100 MCG tablet Take 1 tablet (100 mcg total) by mouth daily. 90 tablet 3   OXYGEN Inhale 2-3 L into the lungs continuous as needed (shortness of breath).     sertraline (ZOLOFT) 100 MG tablet Take 2 tablets (200 mg total) by mouth daily. 180 tablet 3    triamcinolone (NASACORT) 55 MCG/ACT AERO nasal inhaler Place 2 sprays into the nose daily. (Patient taking differently: Place 2 sprays into the nose daily as needed (allergies).) 1 each 12   varenicline (CHANTIX) 1 MG tablet TAKE 1 TABLET BY MOUTH TWICE A DAY 180 tablet 2   chlorpheniramine-HYDROcodone (TUSSIONEX) 10-8 MG/5ML Take 5 mLs by mouth every 12 (twelve) hours. (Patient not taking: Reported on 08/12/2022) 70 mL 0   fluticasone-salmeterol (ADVAIR) 250-50 MCG/ACT AEPB Inhale 1 puff into the lungs in the morning and at bedtime. (Patient not taking: Reported on 08/12/2022) 3 each 3   mometasone-formoterol (DULERA) 100-5 MCG/ACT AERO Inhale 2 puffs into the lungs 2 (two) times daily. (Patient not taking: Reported on 08/12/2022)     oxyCODONE (OXY IR/ROXICODONE) 5 MG immediate release tablet Take 1 tablet (5 mg total) by mouth every 8 (eight) hours as needed for moderate pain. (Patient not taking: Reported on 08/12/2022) 20 tablet 0   predniSONE (DELTASONE) 20 MG tablet Take 2 tablets x 5 days (Patient not taking: Reported on 08/12/2022) 10 tablet 0   No facility-administered medications prior to visit.   Review of Systems  Review of Systems  Constitutional: Negative.   HENT: Negative.    Respiratory:  Positive for shortness of breath. Negative for cough, chest tightness and wheezing.        Baseline DOE   Cardiovascular: Negative.  Negative for chest pain.  Gastrointestinal: Negative.  Negative for abdominal pain.   Physical Exam  BP 110/62 (BP Location: Right Arm, Patient Position: Sitting, Cuff Size: Normal)   Pulse (!) 111   Temp 97.7 F (36.5 C) (Oral)   Ht '5\' 5"'$  (1.651 m)   Wt 170 lb 6.4 oz (77.3 kg)   SpO2 94% Comment: room air  BMI 28.36 kg/m  Physical Exam Constitutional:      Appearance: Normal appearance.  HENT:     Head: Normocephalic and atraumatic.     Mouth/Throat:     Mouth: Mucous membranes are moist.     Pharynx: Oropharynx is clear.  Cardiovascular:     Rate  and Rhythm: Regular rhythm. Tachycardia present.  Pulmonary:     Effort: Pulmonary effort is normal.     Breath sounds: Normal breath sounds.     Comments: CTA Musculoskeletal:        General: Normal range of motion.  Skin:    General: Skin is warm.  Neurological:     General: No focal deficit present.     Mental Status: He is alert and oriented to person, place, and time. Mental status is at baseline.  Psychiatric:        Mood and Affect: Mood normal.        Behavior: Behavior normal.        Thought Content: Thought content normal.        Judgment: Judgment normal.     Lab Results:  CBC    Component Value Date/Time   WBC 5.4 07/15/2022 1055   WBC 14.6 (H) 05/11/2022 0500   RBC 5.19 07/15/2022 1055   RBC 4.37 05/11/2022 0500  HGB 15.3 07/15/2022 1055   HCT 45.4 07/15/2022 1055   PLT 164 07/15/2022 1055   MCV 88 07/15/2022 1055   MCH 29.5 07/15/2022 1055   MCH 30.0 05/11/2022 0500   MCHC 33.7 07/15/2022 1055   MCHC 33.7 05/11/2022 0500   RDW 13.1 07/15/2022 1055   LYMPHSABS 1.1 05/10/2022 2049   MONOABS 0.8 05/10/2022 2049   EOSABS 0.0 05/10/2022 2049   BASOSABS 0.0 05/10/2022 2049    BMET    Component Value Date/Time   NA 139 07/15/2022 1055   K 4.7 07/15/2022 1055   CL 104 07/15/2022 1055   CO2 22 07/15/2022 1055   GLUCOSE 99 07/15/2022 1055   GLUCOSE 149 (H) 05/11/2022 0500   BUN 22 07/15/2022 1055   CREATININE 0.91 07/15/2022 1055   CALCIUM 9.3 07/15/2022 1055   GFRNONAA >60 05/11/2022 0500   GFRAA  07/25/2010 0627    >60        The eGFR has been calculated using the MDRD equation. This calculation has not been validated in all clinical situations. eGFR's persistently <60 mL/min signify possible Chronic Kidney Disease.    BNP    Component Value Date/Time   BNP 63.9 05/10/2022 2049    ProBNP    Component Value Date/Time   PROBNP <30.0 07/25/2010 [id number]    Imaging: ECHO TEE  Result Date: 08/01/2022    TRANSESOPHOGEAL ECHO REPORT    Patient Name:   Christopher Arias Date of Exam: 08/01/2022 Medical Rec #:  [id number]    Height:       64.0 in Accession #:    [id number]   Weight:       170.8 lb Date of Birth:  Jun 10, 1960   BSA:          1.829 m Patient Age:    59 years     BP:           140/50 mmHg Patient Gender: M            HR:           78 bpm. Exam Location:  Inpatient Procedure: Transesophageal Echo Indications:    Abnormal AV  History:        Patient has prior history of Echocardiogram examinations. Aortic                 Valve Disease.  Sonographer:    Bernadene Person RDCS Referring Phys: Josue Hector PROCEDURE: The transesophogeal probe was passed without difficulty through the esophogus of the patient. Sedation performed by different physician. The patient developed no complications during the procedure.  IMPRESSIONS  1. Left ventricular ejection fraction, by estimation, is 60 to 65%. The left ventricle has normal function. The left ventricular internal cavity size was mildly dilated. Left ventricular diastolic function could not be evaluated.  2. Right ventricular systolic function is normal. The right ventricular size is normal.  3. Left atrial size was mildly dilated. No left atrial/left atrial appendage thrombus was detected.  4. The mitral valve is normal in structure. No evidence of mitral valve regurgitation.  5. Bicuspid Sievers type 1 valve with severe eccentric AR PISA radius 1 cm, holodiastolic reversals in ascending thoracic aorta, ERO 0.52 cm 2, and Regurgitant volume 67 cc. The aortic valve is bicuspid. Aortic valve regurgitation is severe.  6. Aortic dilatation noted. There is moderate dilatation of the aortic root, measuring 40 mm. FINDINGS  Left Ventricle: Left ventricular ejection fraction, by estimation, is 60 to 65%. The left  ventricle has normal function. The left ventricular internal cavity size was mildly dilated. There is no left ventricular hypertrophy. Left ventricular diastolic function could not be evaluated.  Right Ventricle: The right ventricular size is normal. No increase in right ventricular wall thickness. Right ventricular systolic function is normal. Left Atrium: Left atrial size was mildly dilated. No left atrial/left atrial appendage thrombus was detected. Right Atrium: Right atrial size was normal in size. Pericardium: There is no evidence of pericardial effusion. Mitral Valve: The mitral valve is normal in structure. No evidence of mitral valve regurgitation. Tricuspid Valve: The tricuspid valve is normal in structure. Tricuspid valve regurgitation is mild. Aortic Valve: Bicuspid Sievers type 1 valve with severe eccentric AR PISA radius 1 cm, holodiastolic reversals in ascending thoracic aorta, ERO 0.52 cm 2, and Regurgitant volume 67 cc. The aortic valve is bicuspid. Aortic valve regurgitation is severe. Aortic regurgitation PHT measures 277 msec. Aortic valve mean gradient measures 6.0 mmHg. Aortic valve peak gradient measures 11.4 mmHg. Aortic valve area, by VTI measures 2.84 cm. Pulmonic Valve: The pulmonic valve was normal in structure. Pulmonic valve regurgitation is trivial. Aorta: Aortic dilatation noted. There is moderate dilatation of the aortic root, measuring 40 mm. IAS/Shunts: No atrial level shunt detected by color flow Doppler.  LEFT VENTRICLE PLAX 2D LVOT diam:     2.60 cm LV SV:         83 LV SV Index:   45 LVOT Area:     5.31 cm  AORTIC VALVE AV Area (Vmax):    2.82 cm AV Area (Vmean):   2.36 cm AV Area (VTI):     2.84 cm AV Vmax:           169.00 cm/s AV Vmean:          118.000 cm/s AV VTI:            0.292 m AV Peak Grad:      11.4 mmHg AV Mean Grad:      6.0 mmHg LVOT Vmax:         89.90 cm/s LVOT Vmean:        52.400 cm/s LVOT VTI:          0.156 m LVOT/AV VTI ratio: 0.53 AI PHT:            277 msec  SHUNTS Systemic VTI:  0.16 m Systemic Diam: 2.60 cm Jenkins Rouge MD Electronically signed by Jenkins Rouge MD Signature Date/Time: 08/01/2022/11:32:47 AM    Final      Assessment &  Plan:   COPD (chronic obstructive pulmonary disease) ([place]) - Patient experiences moderate dyspnea symptoms with exertion and intermittent wheezing. Pulmonary function testing today showed moderate obstructive airway disease with severe diffusion impairment at 36%. Breathing improved with trial Breztri, plan to continue triple therapy regimen. CTA 05/10/22 negative for PE, showed bronchial thickening and mucus plugs. Encourage mucinex and flutter valve as needed for chest congestion/cough.   Acute respiratory failure with hypoxia (H[facility]) - Still requiring supplemental oxygen after being hospitalized for RSV in December 2023. Hypoxia d/t underlying COPD and valve insufficiency. Continue 2L supplemental oxygen with exertion and at bedtime.  He is unable to return to work at this time d/t his limitations. He is considering an early retirement/long term disability.  Aortic regurgitation - Echocardiogram in February 2024 d/t tachycardia/possible flutter that showed aortic insufficiency, he underwent TEE that showed severe eccentric AR. He will be seeing Dr. Lenise Arena, NP 08/12/2022

## 2022-08-12 NOTE — Assessment & Plan Note (Addendum)
-   Still requiring supplemental oxygen after being hospitalized for RSV in December 2023. Hypoxia d/t underlying COPD and valve insufficiency. Continue 2L supplemental oxygen with exertion and at bedtime.  He is unable to return to work at this time d/t his limitations. He is considering an early retirement/long term disability.

## 2022-08-12 NOTE — Assessment & Plan Note (Signed)
-   Echocardiogram in February 2024 d/t tachycardia/possible flutter that showed aortic insufficiency, he underwent TEE that showed severe eccentric AR. He will be seeing Dr. Johnsie Cancel

## 2022-08-13 ENCOUNTER — Encounter: Payer: Self-pay | Admitting: Cardiology

## 2022-08-13 ENCOUNTER — Ambulatory Visit: Payer: BC Managed Care – PPO | Attending: Cardiology | Admitting: Cardiology

## 2022-08-13 VITALS — BP 142/72 | HR 102 | Ht 64.0 in | Wt 169.2 lb

## 2022-08-13 DIAGNOSIS — I351 Nonrheumatic aortic (valve) insufficiency: Secondary | ICD-10-CM

## 2022-08-13 DIAGNOSIS — E785 Hyperlipidemia, unspecified: Secondary | ICD-10-CM | POA: Diagnosis not present

## 2022-08-13 NOTE — Patient Instructions (Addendum)
Medication Instructions:  NO CHANGES  *If you need a refill on your cardiac medications before your next appointment, please call your pharmacy*   Follow-Up: At [facility], you and your health needs are our priority.  As part of our continuing mission to provide you with exceptional heart care, we have created designated Provider Care Teams.  These Care Teams include your primary Cardiologist (physician) and Advanced Practice Providers (APPs -  Physician Assistants and Nurse Practitioners) who all work together to provide you with the care you need, when you need it.  We recommend signing up for the patient portal called "MyChart".  Sign up information is provided on this After Visit Summary.  MyChart is used to connect with patients for Virtual Visits (Telemedicine).  Patients are able to view lab/test results, encounter notes, upcoming appointments, etc.  Non-urgent messages can be sent to your provider as well.   To learn more about what you can do with MyChart, go to [email or web address].    Your next appointment:   3 month(s)  Provider:   Donato Heinz, MD      Other Instructions You have been referred to Cardiothoracic Surgery

## 2022-08-14 NOTE — Progress Notes (Signed)
Reviewed and agree with assessment/plan.   Chesley Mires, MD [facility] Pulmonary/Critical Care 08/14/2022, 11:27 AM Pager:  [phone]

## 2022-08-16 ENCOUNTER — Encounter: Payer: Self-pay | Admitting: *Deleted

## 2022-08-16 ENCOUNTER — Telehealth: Payer: Self-pay | Admitting: *Deleted

## 2022-08-16 DIAGNOSIS — Z01812 Encounter for preprocedural laboratory examination: Secondary | ICD-10-CM | POA: Diagnosis not present

## 2022-08-16 DIAGNOSIS — I351 Nonrheumatic aortic (valve) insufficiency: Secondary | ICD-10-CM | POA: Diagnosis not present

## 2022-08-16 NOTE — Telephone Encounter (Signed)
Called patient to scheduled R+L heart cath per Dr. Gardiner Rhyme. OV 3/12 Patient will come for labs today-will review instructions at that time.  [name] A DEPT OF [place] [id number] [place] [address] Dept: [phone] Loc: [phone]  Christopher Arias  08/16/2022  You are scheduled for a Cardiac Catheterization on Tuesday, March 19 with Dr. Glenetta Hew.  1. Please arrive at the [facility] (Main Entrance A) at [facility]: [address] [place], [place] [address] at 7:00 AM (This time is two hours before your procedure to ensure your preparation). Free valet parking service is available.   Special note: Every effort is made to have your procedure done on time. Please understand that emergencies sometimes delay scheduled procedures.  2. Diet: Do not eat solid foods after midnight.  The patient may have clear liquids until 5am upon the day of the procedure.  3. Labs: Today (BMET, CBC)  4. Medication instructions in preparation for your procedure:   Contrast Allergy: No  On the morning of your procedure, take your Aspirin 81 mg and any morning medicines NOT listed above.  You may use sips of water.  5. Plan for one night stay--bring personal belongings. 6. Bring a current list of your medications and current insurance cards. 7. You MUST have a responsible person to drive you home. 8. Someone MUST be with you the first 24 hours after you arrive home or your discharge will be delayed. 9. Please wear clothes that are easy to get on and off and wear slip-on shoes.  Thank you for allowing [place] to care for you!   -- [facility]

## 2022-08-16 NOTE — Telephone Encounter (Signed)
Patient requesting STD paperwork to be completed.   Advised to have company fax to office to complete.  Fax # provided via Mychart message (per patient request)

## 2022-08-16 NOTE — Telephone Encounter (Signed)
Patient is calling back to speak with [facility], RN. Transferred.

## 2022-08-17 LAB — CBC
Hematocrit: 44.9 % (ref 37.5–51.0)
Hemoglobin: 15 g/dL (ref 13.0–17.7)
MCH: 29.8 pg (ref 26.6–33.0)
MCHC: 33.4 g/dL (ref 31.5–35.7)
MCV: 89 fL (ref 79–97)
Platelets: 183 10*3/uL (ref 150–450)
RBC: 5.03 x10E6/uL (ref 4.14–5.80)
RDW: 13 % (ref 11.6–15.4)
WBC: 7.5 10*3/uL (ref 3.4–10.8)

## 2022-08-17 LAB — BASIC METABOLIC PANEL
BUN/Creatinine Ratio: 19 (ref 10–24)
BUN: 18 mg/dL (ref 8–27)
CO2: 21 mmol/L (ref 20–29)
Calcium: 9.1 mg/dL (ref 8.6–10.2)
Chloride: 104 mmol/L (ref 96–106)
Creatinine, Ser: 0.93 mg/dL (ref 0.76–1.27)
Glucose: 136 mg/dL — ABNORMAL HIGH (ref 70–99)
Potassium: 4.3 mmol/L (ref 3.5–5.2)
Sodium: 142 mmol/L (ref 134–144)
eGFR: 93 mL/min/{1.73_m2} (ref >59)

## 2022-08-19 ENCOUNTER — Telehealth: Payer: Self-pay | Admitting: *Deleted

## 2022-08-19 ENCOUNTER — Other Ambulatory Visit: Payer: Self-pay | Admitting: Internal Medicine

## 2022-08-19 NOTE — Telephone Encounter (Addendum)
Cardiac Catheterization scheduled at [facility] for: Tuesday August 20, 2022 9 AM Arrival time [place] Entrance A at: 7 AM  Nothing to eat after midnight prior to procedure, clear liquids until 5 AM day of procedure.  Medication instructions: -Usual morning medications can be taken with sips of water including aspirin 81 mg.  Confirmed patient has responsible adult to drive home post procedure and be with patient first 24 hours after arriving home.  Plan to go home the same day, you will only stay overnight if medically necessary.  Reviewed procedure instructions with patient.

## 2022-08-20 ENCOUNTER — Encounter (HOSPITAL_COMMUNITY)
Admission: RE | Disposition: A | Discharge status: Home / Self Care | Payer: Self-pay | Source: Ambulatory Visit | Attending: Cardiology

## 2022-08-20 ENCOUNTER — Ambulatory Visit (HOSPITAL_COMMUNITY)
Admission: RE | Admit: 2022-08-20 | Discharge: 2022-08-20 | Disposition: A | Discharge status: Home / Self Care | Payer: BC Managed Care – PPO | Source: Ambulatory Visit | Attending: Cardiology | Admitting: Cardiology

## 2022-08-20 DIAGNOSIS — E785 Hyperlipidemia, unspecified: Secondary | ICD-10-CM | POA: Diagnosis not present

## 2022-08-20 DIAGNOSIS — G4733 Obstructive sleep apnea (adult) (pediatric): Secondary | ICD-10-CM | POA: Insufficient documentation

## 2022-08-20 DIAGNOSIS — Z9981 Dependence on supplemental oxygen: Secondary | ICD-10-CM | POA: Insufficient documentation

## 2022-08-20 DIAGNOSIS — Z79899 Other long term (current) drug therapy: Secondary | ICD-10-CM | POA: Diagnosis not present

## 2022-08-20 DIAGNOSIS — I351 Nonrheumatic aortic (valve) insufficiency: Secondary | ICD-10-CM | POA: Diagnosis not present

## 2022-08-20 DIAGNOSIS — F17200 Nicotine dependence, unspecified, uncomplicated: Secondary | ICD-10-CM | POA: Insufficient documentation

## 2022-08-20 DIAGNOSIS — E039 Hypothyroidism, unspecified: Secondary | ICD-10-CM | POA: Diagnosis not present

## 2022-08-20 DIAGNOSIS — J449 Chronic obstructive pulmonary disease, unspecified: Secondary | ICD-10-CM | POA: Diagnosis not present

## 2022-08-20 HISTORY — PX: RIGHT/LEFT HEART CATH AND CORONARY ANGIOGRAPHY: CATH118266

## 2022-08-20 LAB — POCT I-STAT 7, (LYTES, BLD GAS, ICA,H+H)
Acid-base deficit: 5 mmol/L — ABNORMAL HIGH (ref 0.0–2.0)
Bicarbonate: 20.1 mmol/L (ref 20.0–28.0)
Calcium, Ion: 1.2 mmol/L (ref 1.15–1.40)
HCT: 39 % (ref 39.0–52.0)
Hemoglobin: 13.3 g/dL (ref 13.0–17.0)
O2 Saturation: 97 %
Potassium: 3.7 mmol/L (ref 3.5–5.1)
Sodium: 141 mmol/L (ref 135–145)
TCO2: 21 mmol/L — ABNORMAL LOW (ref 22–32)
pCO2 arterial: 35.6 mmHg (ref 32–48)
pH, Arterial: 7.359 (ref 7.35–7.45)
pO2, Arterial: 93 mmHg (ref 83–108)

## 2022-08-20 LAB — POCT I-STAT EG7
Acid-base deficit: 4 mmol/L — ABNORMAL HIGH (ref 0.0–2.0)
Acid-base deficit: 4 mmol/L — ABNORMAL HIGH (ref 0.0–2.0)
Bicarbonate: 21.2 mmol/L (ref 20.0–28.0)
Bicarbonate: 21.7 mmol/L (ref 20.0–28.0)
Calcium, Ion: 1.19 mmol/L (ref 1.15–1.40)
Calcium, Ion: 1.18 mmol/L (ref 1.15–1.40)
HCT: 39 % (ref 39.0–52.0)
HCT: 39 % (ref 39.0–52.0)
Hemoglobin: 13.3 g/dL (ref 13.0–17.0)
Hemoglobin: 13.3 g/dL (ref 13.0–17.0)
O2 Saturation: 72 %
O2 Saturation: 70 %
Potassium: 3.5 mmol/L (ref 3.5–5.1)
Potassium: 3.6 mmol/L (ref 3.5–5.1)
Sodium: 142 mmol/L (ref 135–145)
Sodium: 142 mmol/L (ref 135–145)
TCO2: 22 mmol/L (ref 22–32)
TCO2: 23 mmol/L (ref 22–32)
pCO2, Ven: 39.9 mmHg — ABNORMAL LOW (ref 44–60)
pCO2, Ven: 40.2 mmHg — ABNORMAL LOW (ref 44–60)
pH, Ven: 7.333 (ref 7.25–7.43)
pH, Ven: 7.339 (ref 7.25–7.43)
pO2, Ven: 40 mmHg (ref 32–45)
pO2, Ven: 39 mmHg (ref 32–45)

## 2022-08-20 SURGERY — RIGHT/LEFT HEART CATH AND CORONARY ANGIOGRAPHY
Anesthesia: LOCAL

## 2022-08-20 MED ORDER — SODIUM CHLORIDE 0.9% FLUSH: 3.0000 mL | INTRAVENOUS | Status: DC | PRN

## 2022-08-20 MED ORDER — FENTANYL CITRATE (PF) 100 MCG/2ML IJ SOLN
INTRAMUSCULAR | Status: DC | PRN
  Administered 2022-08-20: 25 ug via INTRAVENOUS

## 2022-08-20 MED ORDER — SODIUM CHLORIDE 0.9 % IV SOLN: 250.0000 mL | INTRAVENOUS | Status: DC | PRN

## 2022-08-20 MED ORDER — LIDOCAINE HCL (PF) 1 % IJ SOLN
INTRAMUSCULAR | Status: DC | PRN
  Administered 2022-08-20: 5 mL via INTRADERMAL

## 2022-08-20 MED ORDER — MIDAZOLAM HCL 2 MG/2ML IJ SOLN
INTRAMUSCULAR | Status: AC
  Filled 2022-08-20: qty 2

## 2022-08-20 MED ORDER — SODIUM CHLORIDE 0.9% FLUSH: 3.0000 mL | Freq: Two times a day (BID) | INTRAVENOUS | Status: DC

## 2022-08-20 MED ORDER — SODIUM CHLORIDE 0.9 % WEIGHT BASED INFUSION
3.0000 mL/kg/h | INTRAVENOUS | Status: AC
  Administered 2022-08-20: 3 mL/kg/h via INTRAVENOUS

## 2022-08-20 MED ORDER — IOHEXOL 350 MG/ML SOLN
INTRAVENOUS | Status: DC | PRN
  Administered 2022-08-20: 120 mL

## 2022-08-20 MED ORDER — VERAPAMIL HCL 2.5 MG/ML IV SOLN
INTRAVENOUS | Status: AC
  Filled 2022-08-20: qty 2

## 2022-08-20 MED ORDER — ACETAMINOPHEN 325 MG PO TABS: 650.0000 mg | ORAL_TABLET | ORAL | Status: DC | PRN

## 2022-08-20 MED ORDER — VERAPAMIL HCL 2.5 MG/ML IV SOLN
INTRAVENOUS | Status: DC | PRN
  Administered 2022-08-20: 10 mL via INTRA_ARTERIAL

## 2022-08-20 MED ORDER — HYDRALAZINE HCL 20 MG/ML IJ SOLN: 10.0000 mg | INTRAMUSCULAR | Status: DC | PRN

## 2022-08-20 MED ORDER — HEPARIN SODIUM (PORCINE) 1000 UNIT/ML IJ SOLN
INTRAMUSCULAR | Status: DC | PRN
  Administered 2022-08-20: 4000 [IU] via INTRAVENOUS

## 2022-08-20 MED ORDER — ONDANSETRON HCL 4 MG/2ML IJ SOLN: 4.0000 mg | Freq: Four times a day (QID) | INTRAMUSCULAR | Status: DC | PRN

## 2022-08-20 MED ORDER — HEPARIN (PORCINE) IN NACL 1000-0.9 UT/500ML-% IV SOLN
INTRAVENOUS | Status: DC | PRN
  Administered 2022-08-20: 1000 mL

## 2022-08-20 MED ORDER — SODIUM CHLORIDE 0.9 % WEIGHT BASED INFUSION: 1.0000 mL/kg/h | INTRAVENOUS | Status: DC

## 2022-08-20 MED ORDER — HEPARIN SODIUM (PORCINE) 1000 UNIT/ML IJ SOLN
INTRAMUSCULAR | Status: AC
  Filled 2022-08-20: qty 10

## 2022-08-20 MED ORDER — MIDAZOLAM HCL 2 MG/2ML IJ SOLN
INTRAMUSCULAR | Status: DC | PRN
  Administered 2022-08-20: 1 mg via INTRAVENOUS

## 2022-08-20 MED ORDER — FENTANYL CITRATE (PF) 100 MCG/2ML IJ SOLN
INTRAMUSCULAR | Status: AC
  Filled 2022-08-20: qty 2

## 2022-08-20 MED ORDER — SODIUM CHLORIDE 0.9 % IV SOLN: INTRAVENOUS | Status: DC

## 2022-08-20 MED ORDER — ASPIRIN 81 MG PO CHEW
81.0000 mg | CHEWABLE_TABLET | ORAL | Status: AC
  Administered 2022-08-20: 81 mg via ORAL
  Filled 2022-08-20: qty 1

## 2022-08-20 MED ORDER — LIDOCAINE HCL (PF) 1 % IJ SOLN
INTRAMUSCULAR | Status: AC
  Filled 2022-08-20: qty 30

## 2022-08-20 SURGICAL SUPPLY — 13 items
CATH 5FR JL3.5 JR4 ANG PIG MP (CATHETERS) IMPLANT
CATH BALLN WEDGE 5F 110CM (CATHETERS) IMPLANT
CATH LAUNCHER 5F RADR (CATHETERS) IMPLANT
CATHETER LAUNCHER 5F RADR (CATHETERS) ×1
DEVICE RAD COMP TR BAND LRG (VASCULAR PRODUCTS) IMPLANT
GLIDESHEATH SLEND SS 6F .021 (SHEATH) IMPLANT
GUIDEWIRE INQWIRE 1.5J.035X260 (WIRE) IMPLANT
INQWIRE 1.5J .035X260CM (WIRE) ×1
KIT HEART LEFT (KITS) ×1 IMPLANT
PACK CARDIAC CATHETERIZATION (CUSTOM PROCEDURE TRAY) ×1 IMPLANT
SHEATH GLIDE SLENDER 4/5FR (SHEATH) IMPLANT
TRANSDUCER W/STOPCOCK (MISCELLANEOUS) ×1 IMPLANT
TUBING CIL FLEX 10 FLL-RA (TUBING) ×1 IMPLANT

## 2022-08-20 NOTE — Brief Op Note (Signed)
    08/20/2022  11:05 AM SURGEON:  Surgeon(s) and Role:    * Leonie Man, MD - Primary  PROCEDURE:  Procedure(s): RIGHT/LEFT HEART CATH AND CORONARY ANGIOGRAPHY (N/A)  PATIENT:  Christopher Arias  62 y.o. male referred by Dr. Nechama Guard for preop right and left heart catheterization as part of pre-TAVR evaluation for severe aortic regurgitation.  PRE-OPERATIVE DIAGNOSIS: Severe Aortic Regurgitation  POST-OPERATIVE DIAGNOSIS:   Aneurysmal coronary arteries with no stenosis.   RCA has a low takeoff with very vestibular aneurysmal takeoff and sharp bend.  The entire vessel is aneurysmal and ectatic and provides a PDA with small PL branch Largest ectatic/aneurysmal left main trifurcates into LAD, RI and LCx  LAD is ectatic in nature but then tapers to normal size vessel gives a very proximal diagonal branch that is also large caliber, no stenoses RI is also a normal/large-caliber vessel that reaches the apex LCx takes a very sharp angular takeoff from the ectatic left main is normal in size but before dilating into an aneurysmal segment just proximal to the takeoff of the AV groove.  After this there is are OM1 and 2 branches that are free of disease, the AV groove branch gives off LPL 1 there is relatively small Right Heart Cath numbers: RAP 4 mmHg, RV P-EDP 32/3-10 mmHg; PAP-mean 33/22-26 mmHg; PCWP 27 mmHg. LVP-EDP 132/1-12 million mercury.  AOP-MAP 128/70-98 mmHg. Ao sat 97%, PA sat 71%.  CO-CI (Fick) 4.47-2.51.  PROCEDURE PERFORMED Time Out: Verified patient identification, verified procedure, site/side was marked, verified correct patient position, special equipment/implants available, medications/allergies/relevent history reviewed, required imaging and test results available. Performed.  Access:  Right Radial Artery: 6 Fr sheath -- Seldinger technique using Micropuncture Kit Direct ultrasound guidance used.  Permanent image obtained and placed on chart. 10 mL radial cocktail IA; 4000  Units IV Heparin * Right Brachial/Antecubital Vein: The existing 18-gauge IV was exchanged over a wire for a 5Fr  sheath  Right Heart Catheterization: 5 Fr Gordy Councilman catheter advanced under fluoroscopy with balloon inflated to the RA, RV, then PCWP-PA for hemodynamic measurement.  * Simultaneous FA & PA blood gases checked for SaO2% to calculate FICK CO/CI   * Catheter removed completely out of the body with balloon deflated.  Left Heart Catheterization: 5Fr Catheters advanced or exchanged over a J-wire under direct fluoroscopic guidance into the ascending aorta; JR4 catheter advanced first.  * LV Hemodynamics (no LV Gram): JR4 catheter * Left Coronary Artery Cineangiography: JL 3.5 catheter  * Right Coronary Artery Cineangiography: EZ-rad right catheter   Upon completion of Angiogaphy, the catheter was removed completely out of the body over a wire, without complication.  Brachial Sheath(s) removed in the Cath Lab with manual pressure for hemostasis.    Radial sheath removed in the Cardiac Catheterization lab with TR Band placed for hemostasis.  TR Band: 1100  Hours; 14 mL air; reverse Barbeau B  MEDICATIONS * SQ Lidocaine 35mL * Radial Cocktail: 3 mg Verapmil in 10 mL NS * Isovue Contrast: 120 mL * Heparin: 4000 units   EBL:  < 38mL   DICTATION: .Dragon Dictation  PLAN OF CARE: Discharge to home after PACU  PATIENT DISPOSITION:  PACU - hemodynamically stable.   Delay start of Pharmacological VTE agent (>24hrs) due to surgical blood loss or risk of bleeding: no

## 2022-08-20 NOTE — Discharge Instructions (Signed)
DRINK PLENTY OF FLUIDS OVER THE NEXT 2-3 DAYS.

## 2022-08-20 NOTE — Interval H&P Note (Signed)
History and Physical Interval Note:  08/20/2022 9:57 AM  Christopher Arias  has presented today for surgery, with the diagnosis of aortic regurg.  The various methods of treatment have been discussed with the patient and family. After consideration of risks, benefits and other options for treatment, the patient has consented to  Procedure(s): RIGHT/LEFT HEART CATH AND CORONARY ANGIOGRAPHY (N/A) as a surgical intervention.  The patient's history has been reviewed, patient examined, no change in status, stable for surgery.  I have reviewed the patient's chart and labs.  Questions were answered to the patient's satisfaction.     Glenetta Hew

## 2022-08-21 ENCOUNTER — Encounter (HOSPITAL_COMMUNITY): Payer: Self-pay | Admitting: Cardiology

## 2022-09-11 ENCOUNTER — Encounter: Payer: Self-pay | Admitting: Surgery

## 2022-09-11 ENCOUNTER — Institutional Professional Consult (permissible substitution) (INDEPENDENT_AMBULATORY_CARE_PROVIDER_SITE_OTHER): Payer: BC Managed Care – PPO | Admitting: Surgery

## 2022-09-11 ENCOUNTER — Other Ambulatory Visit: Payer: Self-pay | Admitting: Surgery

## 2022-09-11 VITALS — BP 119/83 | HR 87 | Resp 20 | Ht 64.0 in | Wt 140.0 lb

## 2022-09-11 DIAGNOSIS — I351 Nonrheumatic aortic (valve) insufficiency: Secondary | ICD-10-CM

## 2022-09-11 NOTE — Progress Notes (Signed)
Cardiothoracic Surgery Consultation  PCP is Corwin Levins, MD Referring Provider is Little Ishikawa*  Chief Complaint  Patient presents with  . Aortic Insuffiency    Cardiac cath 3/19, TEE 2/29, ECHO 2/8, PFTs 3/11    HPI:   Past Medical History:  Diagnosis Date  . ANTERIOR PITUITARY HYPERFUNCTION 11/23/2009  . ANXIETY 11/24/2007  . COPD (chronic obstructive pulmonary disease) 05/31/2021  . GERD 11/24/2007  . GYNECOMASTIA 11/10/2009  . HYPERLIPIDEMIA 11/24/2007  . HYPOTHYROIDISM, POST-RADIATION 03/16/2010    Past Surgical History:  Procedure Laterality Date  . INGUINAL HERNIA REPAIR    . RIGHT/LEFT HEART CATH AND CORONARY ANGIOGRAPHY N/A 08/20/2022   Procedure: RIGHT/LEFT HEART CATH AND CORONARY ANGIOGRAPHY;  Surgeon: Marykay Lex, MD;  Location: [facility] INVASIVE CV LAB;  Service: Cardiovascular;  Laterality: N/A;  . TEE WITHOUT CARDIOVERSION N/A 08/01/2022   Procedure: TRANSESOPHAGEAL ECHOCARDIOGRAM (TEE);  Surgeon: Wendall Stade, MD;  Location: [facility] ENDOSCOPY;  Service: Cardiovascular;  Laterality: N/A;    Family History  Problem Relation Age of Onset  . Heart disease Father   . Stroke Other   . COPD Other   . Heart disease Other   . Colon cancer Neg Hx     Social History Social History   Tobacco Use  . Smoking status: Former    Packs/day: 1.00    Years: 20.00    Additional pack years: 0.00    Total pack years: 20.00    Types: Cigarettes    Quit date: 08/24/2021    Years since quitting: 1.0  . Smokeless tobacco: Never  Substance Use Topics  . Alcohol use: Yes    Comment: rarely  . Drug use: No    Current Outpatient Medications  Medication Sig Dispense Refill  . albuterol (VENTOLIN HFA) 108 (90 Base) MCG/ACT inhaler Inhale 2 puffs into the lungs every 6 (six) hours as needed for wheezing or shortness of breath. 18 g 3  . atorvastatin (LIPITOR) 20 MG tablet Take 1 tablet (20 mg total) by mouth daily. 90 tablet 3  . Budeson-Glycopyrrol-Formoterol  (BREZTRI AEROSPHERE) 160-9-4.8 MCG/ACT AERO Inhale 2 puffs into the lungs in the morning and at bedtime. 10.7 g 5  . clonazePAM (KLONOPIN) 1 MG tablet TAKE 1 TABLET BY MOUTH 2 TIMES DAILY AS NEEDED. 60 tablet 2  . diphenhydramine-acetaminophen (TYLENOL PM) 25-500 MG TABS tablet Take 2 tablets by mouth at bedtime.    Marland Kitchen levothyroxine (SYNTHROID) 100 MCG tablet Take 1 tablet (100 mcg total) by mouth daily. 90 tablet 3  . OXYGEN Inhale 2-3 L into the lungs at bedtime. As needed during the day    . sertraline (ZOLOFT) 100 MG tablet Take 2 tablets (200 mg total) by mouth daily. (Patient taking differently: Take 200 mg by mouth at bedtime.) 180 tablet 3  . varenicline (CHANTIX) 1 MG tablet TAKE 1 TABLET BY MOUTH TWICE A DAY (Patient taking differently: Take 1 mg by mouth daily.) 180 tablet 2   No current facility-administered medications for this visit.    Allergies  Allergen Reactions  . Atenolol Other (See Comments)    Nose bleeds    Review of Systems  BP 119/83 (BP Location: Left Arm, Patient Position: Sitting)   Pulse 87   Resp 20   Ht 5\' 4"  (1.626 m)   Wt 140 lb (63.5 kg)   SpO2 92% Comment: RA  BMI 24.03 kg/m  Physical Exam   Diagnostic Tests:   Impression:   Plan:   Payton Doughty  Laneta Simmers, MD [facility] [phone]

## 2022-09-12 ENCOUNTER — Other Ambulatory Visit: Payer: Self-pay | Admitting: *Deleted

## 2022-09-12 ENCOUNTER — Encounter: Payer: Self-pay | Admitting: *Deleted

## 2022-09-12 DIAGNOSIS — J210 Acute bronchiolitis due to respiratory syncytial virus: Secondary | ICD-10-CM | POA: Diagnosis not present

## 2022-09-12 DIAGNOSIS — I351 Nonrheumatic aortic (valve) insufficiency: Secondary | ICD-10-CM

## 2022-09-12 DIAGNOSIS — J449 Chronic obstructive pulmonary disease, unspecified: Secondary | ICD-10-CM | POA: Diagnosis not present

## 2022-09-17 ENCOUNTER — Other Ambulatory Visit: Payer: Self-pay | Admitting: Thoracic Surgery (Cardiothoracic Vascular Surgery)

## 2022-09-19 ENCOUNTER — Telehealth: Payer: Self-pay

## 2022-09-19 NOTE — Telephone Encounter (Signed)
STD form completed and faxed to [facility] @ [phone]. Beginning LOA 10/07/22 through 01/06/23./ DOS 10/07/22.

## 2022-10-02 ENCOUNTER — Ambulatory Visit (HOSPITAL_COMMUNITY)
Admission: RE | Admit: 2022-10-02 | Discharge: 2022-10-02 | Disposition: A | Discharge status: Home / Self Care | Payer: BC Managed Care – PPO | Source: Ambulatory Visit | Attending: Surgery | Admitting: Surgery

## 2022-10-02 ENCOUNTER — Telehealth: Payer: Self-pay | Admitting: Primary Care

## 2022-10-02 DIAGNOSIS — Z01818 Encounter for other preprocedural examination: Secondary | ICD-10-CM | POA: Diagnosis not present

## 2022-10-02 DIAGNOSIS — I351 Nonrheumatic aortic (valve) insufficiency: Secondary | ICD-10-CM | POA: Insufficient documentation

## 2022-10-02 DIAGNOSIS — I2510 Atherosclerotic heart disease of native coronary artery without angina pectoris: Secondary | ICD-10-CM | POA: Diagnosis not present

## 2022-10-02 MED ORDER — IOHEXOL 350 MG/ML SOLN
75.0000 mL | Freq: Once | INTRAVENOUS | Status: AC | PRN
  Administered 2022-10-02: 75 mL via INTRAVENOUS

## 2022-10-02 NOTE — Telephone Encounter (Signed)
He needs a visit with a provider in person to assess his symptoms in person, if he is acutely ill likely not a good idea to undergo surgery

## 2022-10-02 NOTE — Pre-Procedure Instructions (Signed)
Surgical Instructions    Your procedure is scheduled on Monday, May 6th.  Report to [facility] Main Entrance "A" at 05:30 A.M., then check in with the Admitting office.  Call this number if you have problems the morning of surgery:  [phone]  If you have any questions prior to your surgery date call [phone]: Open Monday-Friday 8am-4pm If you experience any cold or flu symptoms such as cough, fever, chills, shortness of breath, etc. between now and your scheduled surgery, please notify us at the above number.     Remember:  Do not eat or drink after midnight the night before your surgery     Take these medicines the morning of surgery with A SIP OF WATER  atorvastatin (LIPITOR)  Budeson-Glycopyrrol-Formoterol (BREZTRI AEROSPHERE)  levothyroxine (SYNTHROID)    If needed: albuterol (VENTOLIN HFA)- bring inhaler with you on day of surgery clonazePAM (KLONOPIN)     As of today, STOP taking any Aspirin (unless otherwise instructed by your surgeon) Aleve, Naproxen, Ibuprofen, Motrin, Advil, Goody's, BC's, all herbal medications, fish oil, and all vitamins.                     Do NOT Smoke (Tobacco/Vaping) for 24 hours prior to your procedure.  If you use a CPAP at night, you may bring your mask/headgear for your overnight stay.   Contacts, glasses, piercing's, hearing aid's, dentures or partials may not be worn into surgery, please bring cases for these belongings.    For patients admitted to the hospital, discharge time will be determined by your treatment team.   Patients discharged the day of surgery will not be allowed to drive home, and someone needs to stay with them for 24 hours.  SURGICAL WAITING ROOM VISITATION Patients having surgery or a procedure may have no more than 2 support people in the waiting area - these visitors may rotate.   Children under the age of 14 must have an adult with them who is not the patient. If the patient needs to stay at the hospital  during part of their recovery, the visitor guidelines for inpatient rooms apply. Pre-op nurse will coordinate an appropriate time for 1 support person to accompany patient in pre-op.  This support person may not rotate.   Please refer to the [facility] website for the visitor guidelines for Inpatients (after your surgery is over and you are in a regular room).    Special instructions:   [facility]- Preparing For Surgery  Before surgery, you can play an important role. Because skin is not sterile, your skin needs to be as free of germs as possible. You can reduce the number of germs on your skin by washing with CHG (chlorahexidine gluconate) Soap before surgery.  CHG is an antiseptic cleaner which kills germs and bonds with the skin to continue killing germs even after washing.    Oral Hygiene is also important to reduce your risk of infection.  Remember - BRUSH YOUR TEETH THE MORNING OF SURGERY WITH YOUR REGULAR TOOTHPASTE  Please do not use if you have an allergy to CHG or antibacterial soaps. If your skin becomes reddened/irritated stop using the CHG.  Do not shave (including legs and underarms) for at least 48 hours prior to first CHG shower. It is OK to shave your face.  Please follow these instructions carefully.   Shower the NIGHT BEFORE SURGERY and the MORNING OF SURGERY  If you chose to wash your hair, wash your hair first  as usual with your normal shampoo.  After you shampoo, rinse your hair and body thoroughly to remove the shampoo.  Use CHG Soap as you would any other liquid soap. You can apply CHG directly to the skin and wash gently with a scrungie or a clean washcloth.   Apply the CHG Soap to your body ONLY FROM THE NECK DOWN.  Do not use on open wounds or open sores. Avoid contact with your eyes, ears, mouth and genitals (private parts). Wash Face and genitals (private parts)  with your normal soap.   Wash thoroughly, paying special attention to the area where your surgery  will be performed.  Thoroughly rinse your body with warm water from the neck down.  DO NOT shower/wash with your normal soap after using and rinsing off the CHG Soap.  Pat yourself dry with a CLEAN TOWEL.  Wear CLEAN PAJAMAS to bed the night before surgery  Place CLEAN SHEETS on your bed the night before your surgery  DO NOT SLEEP WITH PETS.   Day of Surgery: Take a shower with CHG soap. Do not wear jewelry  Do not wear lotions, powders, colognes, or deodorant. Men may shave face and neck. Do not bring valuables to the hospital. [facility] is not responsible for any belongings or valuables. Wear Clean/Comfortable clothing the morning of surgery Remember to brush your teeth WITH YOUR REGULAR TOOTHPASTE.   Please read over the following fact sheets that you were given.    If you received a COVID test during your pre-op visit  it is requested that you wear a mask when out in public, stay away from anyone that may not be feeling well and notify your surgeon if you develop symptoms. If you have been in contact with anyone that has tested positive in the last 10 days please notify you surgeon.

## 2022-10-02 NOTE — Telephone Encounter (Signed)
Spoke with patients wife states patient is getting over a cold-seems to be improved some, but is having a bad cough with brown phlegm, wheezing and SOB. Symptoms present over the last week or so. Patient has been using OTC medications like Delsym  Patient is scheduled for open heart surgery 5/6. Wants to get symptoms clear before then  [facility] advise?

## 2022-10-02 NOTE — Telephone Encounter (Signed)
PT wife calling. States husband soon to have surgery. He has a bad cough and Ms. Clent Ridges, asked they call us if his cough is still not better as the surgery date closes in.   Pls call wife @ [phone]   [facility]

## 2022-10-02 NOTE — Telephone Encounter (Signed)
Called and spoke with Christopher Arias. I was able to get him scheduled with Dr. Celine Mans tomorrow at 915am as this was the only opening we had. She verbalized understanding.   Nothing further needed at time of call.

## 2022-10-03 ENCOUNTER — Ambulatory Visit (HOSPITAL_COMMUNITY): Payer: BC Managed Care – PPO

## 2022-10-03 ENCOUNTER — Encounter: Payer: Self-pay | Admitting: *Deleted

## 2022-10-03 ENCOUNTER — Encounter: Payer: Self-pay | Admitting: Internal Medicine

## 2022-10-03 ENCOUNTER — Inpatient Hospital Stay (HOSPITAL_COMMUNITY)
Admission: RE | Admit: 2022-10-03 | Discharge: 2022-10-03 | Disposition: A | Discharge status: Home / Self Care | Payer: BC Managed Care – PPO | Source: Ambulatory Visit

## 2022-10-03 ENCOUNTER — Telehealth: Payer: Self-pay | Admitting: Internal Medicine

## 2022-10-03 ENCOUNTER — Ambulatory Visit: Payer: BC Managed Care – PPO | Admitting: Internal Medicine

## 2022-10-03 VITALS — BP 128/62 | HR 102 | Temp 97.9°F | Ht 65.0 in | Wt 167.6 lb

## 2022-10-03 DIAGNOSIS — J441 Chronic obstructive pulmonary disease with (acute) exacerbation: Secondary | ICD-10-CM | POA: Diagnosis not present

## 2022-10-03 MED ORDER — ALBUTEROL SULFATE (2.5 MG/3ML) 0.083% IN NEBU: 2.5000 mg | INHALATION_SOLUTION | Freq: Four times a day (QID) | RESPIRATORY_TRACT | 12 refills | Status: DC | PRN

## 2022-10-03 MED ORDER — PREDNISONE 20 MG PO TABS
40.0000 mg | ORAL_TABLET | Freq: Every day | ORAL | 0 refills | Status: DC
Start: 2022-10-03 — End: 2022-11-14

## 2022-10-03 MED ORDER — AMOXICILLIN-POT CLAVULANATE 875-125 MG PO TABS
1.0000 | ORAL_TABLET | Freq: Two times a day (BID) | ORAL | 0 refills | Status: DC
Start: 2022-10-03 — End: 2022-11-14

## 2022-10-03 NOTE — Telephone Encounter (Signed)
ATC x1.  Male answered the phone and then the call ended abruptly.  ATC x2.  No answer.  Left detailed VM.

## 2022-10-03 NOTE — Telephone Encounter (Signed)
Spoke with patient advised per Dr. Celine Mans he can try OTC delsym to help with the coughing. He verbalized understanding. NFN

## 2022-10-03 NOTE — Patient Instructions (Signed)
Please schedule follow up scheduled with myself in 6 weeks.  If my schedule is not open yet, we will contact you with a reminder closer to that time. Please call [phone] if you haven't heard from [place] a month before.   Start taking albuterol 2 puffs every 4 hours while awake.   We will get a nebulizer machine sent for home use with albuterol breathing treatments.   Continue breztri 2 puffs twice a day, gargle after use.   Take prednisone 40 mg x 5 days Take augmentin 1 pill twice a day x 5 days.   We might have to put your surgery on hold in light of this recent flare in your breathing.

## 2022-10-03 NOTE — Progress Notes (Signed)
Christopher Arias    [id number]    [birth year]  Primary Care Physician:John, Len Blalock, MD Date of Appointment: 10/03/2022 Established Patient Visit  Chief complaint:   Chief Complaint  Patient presents with   Acute Visit    Productive cough with brown phlegm, SOB, wheezing. Symptoms present x 4 days Patient has tried to use Nyquil     HPI: Christopher Arias is a 62 y.o. man with COPD FEV1 60% of predicted on home oxygen with frequent exacerbations. History of PSVR Pneumonia December 2023.  On oxygen since then.   Interval Updates: Here with worsening cough, shortness of breath. On breztri 2 puffs twice a day. Not taking albuterol.Really struggling last night and waking up with shortness of breath  Does not have nebulizer treatment at home. No fevers chills night sweats. Recent sick contact in his wife who was hospitalized.   He is due to have AVR  via sternotomy with Dr. Laneta Simmers next Monday.   I have reviewed the patient's family social and past medical history and updated as appropriate.   Past Medical History:  Diagnosis Date   ANTERIOR PITUITARY HYPERFUNCTION 11/23/2009   ANXIETY 11/24/2007   COPD (chronic obstructive pulmonary disease) (HCC) 05/31/2021   GERD 11/24/2007   GYNECOMASTIA 11/10/2009   HYPERLIPIDEMIA 11/24/2007   HYPOTHYROIDISM, POST-RADIATION 03/16/2010    Past Surgical History:  Procedure Laterality Date   INGUINAL HERNIA REPAIR     RIGHT/LEFT HEART CATH AND CORONARY ANGIOGRAPHY N/A 08/20/2022   Procedure: RIGHT/LEFT HEART CATH AND CORONARY ANGIOGRAPHY;  Surgeon: Marykay Lex, MD;  Location: [facility] INVASIVE CV LAB;  Service: Cardiovascular;  Laterality: N/A;   TEE WITHOUT CARDIOVERSION N/A 08/01/2022   Procedure: TRANSESOPHAGEAL ECHOCARDIOGRAM (TEE);  Surgeon: Wendall Stade, MD;  Location: [facility] ENDOSCOPY;  Service: Cardiovascular;  Laterality: N/A;    Family History  Problem Relation Age of Onset   Heart disease Father    Stroke Other    COPD Other     Heart disease Other    Colon cancer Neg Hx     Social History   Occupational History    Employer: [employer]  Tobacco Use   Smoking status: Former    Packs/day: 1.00    Years: 20.00    Additional pack years: 0.00    Total pack years: 20.00    Types: Cigarettes    Quit date: 08/24/2021    Years since quitting: 1.1   Smokeless tobacco: Never  Substance and Sexual Activity   Alcohol use: Yes    Comment: rarely   Drug use: No   Sexual activity: Not on file     Physical Exam: Blood pressure 128/62, pulse (!) 102, temperature 97.9 F (36.6 C), temperature source Oral, height 5\' 5"  (1.651 m), weight 167 lb 9.6 oz (76 kg), SpO2 96 %.  Gen:      No acute distress, frequent dry coughing ENT:  on nasal cannula, no nasal polyps, mucus membranes moist Lungs:   diminished, decreased air entry, no wheezes CV:         tachycardic, systolic murmur   Data Reviewed: Imaging: I have personally reviewed the chest xrays Dec 2023 showing hyperinflation, ctpe study dec 2023 shows upper lobe predominant emphysema  PFTs:     Latest Ref Rng & Units 08/12/2022    8:52 AM  PFT Results  FVC-Pre L 3.46   FVC-Predicted Pre % 88   FVC-Post L 3.64   FVC-Predicted Post % 92  Pre FEV1/FVC % % 52   Post FEV1/FCV % % 51   FEV1-Pre L 1.79   FEV1-Predicted Pre % 60   FEV1-Post L 1.86   DLCO uncorrected ml/min/mmHg 8.82   DLCO UNC% % 37   DLCO corrected ml/min/mmHg 8.65   DLCO COR %Predicted % 36   DLVA Predicted % 37   TLC L 7.00   TLC % Predicted % 116   RV % Predicted % 149    I have personally reviewed the patient's PFTs and moderate airflow limitation  Labs:  Immunization status: Immunization History  Administered Date(s) Administered   H1N1 07/19/2008   Influenza,inj,Quad PF,6+ Mos 03/12/2013, 06/17/2014, 03/08/2019, 05/30/2021, 05/21/2022   PFIZER(Purple Top)SARS-COV-2 Vaccination 08/30/2019, 09/20/2019   Td 07/19/2008   Tdap 09/13/2012   Zoster Recombinat (Shingrix) 11/27/2020,  01/31/2021    External Records Personally Reviewed: pulmonary, cardiology  Assessment:  COPD FEV1 60% of predicted with acute exacerbation  Plan/Recommendations:  Start taking albuterol 2 puffs every 4 hours while awake.   We will get a nebulizer machine sent for home use with albuterol breathing treatments.   Continue breztri 2 puffs twice a day, gargle after use.   Take prednisone 40 mg x 5 days Take augmentin 1 pill twice a day x 5 days.   We might have to put your surgery on hold in light of this recent flare in your breathing.    Return to Care: Return in about 6 weeks (around 11/14/2022).   Durel Salts, MD Pulmonary and Critical Care Medicine [facility] Office:[phone]

## 2022-10-04 ENCOUNTER — Encounter: Payer: Self-pay | Admitting: *Deleted

## 2022-10-04 DIAGNOSIS — J441 Chronic obstructive pulmonary disease with (acute) exacerbation: Secondary | ICD-10-CM | POA: Diagnosis not present

## 2022-10-10 ENCOUNTER — Telehealth: Payer: Self-pay

## 2022-10-10 NOTE — Telephone Encounter (Signed)
STD forms updated with new surgery date and recovery time. Faxed to [facility] @ [phone]. Beginning LOA 11/04/22 through 02/04/23./ DOS 11/04/22

## 2022-10-12 DIAGNOSIS — J449 Chronic obstructive pulmonary disease, unspecified: Secondary | ICD-10-CM | POA: Diagnosis not present

## 2022-10-12 DIAGNOSIS — J210 Acute bronchiolitis due to respiratory syncytial virus: Secondary | ICD-10-CM | POA: Diagnosis not present

## 2022-10-15 ENCOUNTER — Other Ambulatory Visit (HOSPITAL_COMMUNITY): Payer: BC Managed Care – PPO

## 2022-10-15 ENCOUNTER — Ambulatory Visit (HOSPITAL_COMMUNITY): Payer: BC Managed Care – PPO

## 2022-10-30 NOTE — Pre-Procedure Instructions (Signed)
Surgical Instructions    Your procedure is scheduled on Monday, June 3rd.  Report to [facility] Main Entrance "A" at 05:30 A.M., then check in with the Admitting office.  Call this number if you have problems the morning of surgery:  [phone]  If you have any questions prior to your surgery date call [phone]: Open Monday-Friday 8am-4pm If you experience any cold or flu symptoms such as cough, fever, chills, shortness of breath, etc. between now and your scheduled surgery, please notify us at the above number.     Remember:  Do not eat or drink after midnight the night before your surgery     Take these medicines the morning of surgery with A SIP OF WATER  amoxicillin-clavulanate (AUGMENTIN)  atorvastatin (LIPITOR)  Budeson-Glycopyrrol-Formoterol (BREZTRI AEROSPHERE)  levothyroxine (SYNTHROID)  predniSONE (DELTASONE)    If needed: albuterol (PROVENTIL) nebulizer albuterol (VENTOLIN HFA)- bring inhaler with you day of surgery clonazePAM (KLONOPIN)    As of today, STOP taking any Aspirin (unless otherwise instructed by your surgeon) Aleve, Naproxen, Ibuprofen, Motrin, Advil, Goody's, BC's, all herbal medications, fish oil, and all vitamins.                     Do NOT Smoke (Tobacco/Vaping) for 24 hours prior to your procedure.  If you use a CPAP at night, you may bring your mask/headgear for your overnight stay.   Contacts, glasses, piercing's, hearing aid's, dentures or partials may not be worn into surgery, please bring cases for these belongings.    For patients admitted to the hospital, discharge time will be determined by your treatment team.   Patients discharged the day of surgery will not be allowed to drive home, and someone needs to stay with them for 24 hours.  SURGICAL WAITING ROOM VISITATION Patients having surgery or a procedure may have no more than 2 support people in the waiting area - these visitors may rotate.   Children under the age of 15 must  have an adult with them who is not the patient. If the patient needs to stay at the hospital during part of their recovery, the visitor guidelines for inpatient rooms apply. Pre-op nurse will coordinate an appropriate time for 1 support person to accompany patient in pre-op.  This support person may not rotate.   Please refer to the [facility] website for the visitor guidelines for Inpatients (after your surgery is over and you are in a regular room).    Special instructions:   [place]- Preparing For Surgery  Before surgery, you can play an important role. Because skin is not sterile, your skin needs to be as free of germs as possible. You can reduce the number of germs on your skin by washing with CHG (chlorahexidine gluconate) Soap before surgery.  CHG is an antiseptic cleaner which kills germs and bonds with the skin to continue killing germs even after washing.    Oral Hygiene is also important to reduce your risk of infection.  Remember - BRUSH YOUR TEETH THE MORNING OF SURGERY WITH YOUR REGULAR TOOTHPASTE  Please do not use if you have an allergy to CHG or antibacterial soaps. If your skin becomes reddened/irritated stop using the CHG.  Do not shave (including legs and underarms) for at least 48 hours prior to first CHG shower. It is OK to shave your face.  Please follow these instructions carefully.   Shower the NIGHT BEFORE SURGERY and the MORNING OF SURGERY  If you chose to  wash your hair, wash your hair first as usual with your normal shampoo.  After you shampoo, rinse your hair and body thoroughly to remove the shampoo.  Use CHG Soap as you would any other liquid soap. You can apply CHG directly to the skin and wash gently with a scrungie or a clean washcloth.   Apply the CHG Soap to your body ONLY FROM THE NECK DOWN.  Do not use on open wounds or open sores. Avoid contact with your eyes, ears, mouth and genitals (private parts). Wash Face and genitals (private parts)  with  your normal soap.   Wash thoroughly, paying special attention to the area where your surgery will be performed.  Thoroughly rinse your body with warm water from the neck down.  DO NOT shower/wash with your normal soap after using and rinsing off the CHG Soap.  Pat yourself dry with a CLEAN TOWEL.  Wear CLEAN PAJAMAS to bed the night before surgery  Place CLEAN SHEETS on your bed the night before your surgery  DO NOT SLEEP WITH PETS.   Day of Surgery: Take a shower with CHG soap. Do not wear jewelry  Do not wear lotions, powders, colognes, or deodorant. Men may shave face and neck. Do not bring valuables to the hospital. [facility] is not responsible for any belongings or valuables. Wear Clean/Comfortable clothing the morning of surgery Remember to brush your teeth WITH YOUR REGULAR TOOTHPASTE.   Please read over the following fact sheets that you were given.    If you received a COVID test during your pre-op visit  it is requested that you wear a mask when out in public, stay away from anyone that may not be feeling well and notify your surgeon if you develop symptoms. If you have been in contact with anyone that has tested positive in the last 10 days please notify you surgeon.

## 2022-10-31 ENCOUNTER — Other Ambulatory Visit: Payer: Self-pay

## 2022-10-31 ENCOUNTER — Encounter (HOSPITAL_COMMUNITY)
Admission: RE | Admit: 2022-10-31 | Discharge: 2022-10-31 | Disposition: A | Discharge status: Home / Self Care | Payer: BC Managed Care – PPO | Source: Ambulatory Visit | Attending: Surgery | Admitting: Surgery

## 2022-10-31 ENCOUNTER — Ambulatory Visit (HOSPITAL_COMMUNITY)
Admission: RE | Admit: 2022-10-31 | Discharge: 2022-10-31 | Disposition: A | Discharge status: Home / Self Care | Payer: BC Managed Care – PPO | Source: Ambulatory Visit | Attending: Surgery | Admitting: Surgery

## 2022-10-31 ENCOUNTER — Encounter (HOSPITAL_COMMUNITY): Payer: Self-pay

## 2022-10-31 VITALS — BP 140/87 | HR 86 | Temp 98.0°F | Resp 17 | Ht 65.0 in | Wt 171.6 lb

## 2022-10-31 DIAGNOSIS — Z87891 Personal history of nicotine dependence: Secondary | ICD-10-CM | POA: Diagnosis not present

## 2022-10-31 DIAGNOSIS — Z1152 Encounter for screening for COVID-19: Secondary | ICD-10-CM | POA: Insufficient documentation

## 2022-10-31 DIAGNOSIS — J439 Emphysema, unspecified: Secondary | ICD-10-CM | POA: Diagnosis not present

## 2022-10-31 DIAGNOSIS — J432 Centrilobular emphysema: Secondary | ICD-10-CM | POA: Insufficient documentation

## 2022-10-31 DIAGNOSIS — I351 Nonrheumatic aortic (valve) insufficiency: Secondary | ICD-10-CM | POA: Diagnosis not present

## 2022-10-31 DIAGNOSIS — E785 Hyperlipidemia, unspecified: Secondary | ICD-10-CM | POA: Insufficient documentation

## 2022-10-31 DIAGNOSIS — Z01818 Encounter for other preprocedural examination: Secondary | ICD-10-CM | POA: Insufficient documentation

## 2022-10-31 DIAGNOSIS — I440 Atrioventricular block, first degree: Secondary | ICD-10-CM | POA: Diagnosis not present

## 2022-10-31 HISTORY — DX: Other specified postprocedural states: Z98.890

## 2022-10-31 HISTORY — DX: Nonrheumatic aortic (valve) insufficiency: I35.1

## 2022-10-31 HISTORY — DX: Other specified postprocedural states: R11.2

## 2022-10-31 LAB — BLOOD GAS, ARTERIAL
Acid-base deficit: 0.8 mmol/L (ref 0.0–2.0)
Bicarbonate: 23.5 mmol/L (ref 20.0–28.0)
Drawn by: 58793
O2 Saturation: 98.1 %
Patient temperature: 37
pCO2 arterial: 37 mmHg (ref 32–48)
pH, Arterial: 7.41 (ref 7.35–7.45)
pO2, Arterial: 79 mmHg — ABNORMAL LOW (ref 83–108)

## 2022-10-31 LAB — CBC
HCT: 44.5 % (ref 39.0–52.0)
Hemoglobin: 14.6 g/dL (ref 13.0–17.0)
MCH: 29.2 pg (ref 26.0–34.0)
MCHC: 32.8 g/dL (ref 30.0–36.0)
MCV: 89 fL (ref 80.0–100.0)
Platelets: 156 10*3/uL (ref 150–400)
RBC: 5 MIL/uL (ref 4.22–5.81)
RDW: 13.5 % (ref 11.5–15.5)
WBC: 7.1 10*3/uL (ref 4.0–10.5)
nRBC: 0 % (ref 0.0–0.2)

## 2022-10-31 LAB — COMPREHENSIVE METABOLIC PANEL
ALT: 26 U/L (ref 0–44)
AST: 19 U/L (ref 15–41)
Albumin: 4 g/dL (ref 3.5–5.0)
Alkaline Phosphatase: 55 U/L (ref 38–126)
Anion gap: 12 (ref 5–15)
BUN: 15 mg/dL (ref 8–23)
CO2: 18 mmol/L — ABNORMAL LOW (ref 22–32)
Calcium: 9.1 mg/dL (ref 8.9–10.3)
Chloride: 106 mmol/L (ref 98–111)
Creatinine, Ser: 0.84 mg/dL (ref 0.61–1.24)
GFR, Estimated: >60 mL/min (ref >60)
Glucose, Bld: 93 mg/dL (ref 70–99)
Potassium: 4.1 mmol/L (ref 3.5–5.1)
Sodium: 136 mmol/L (ref 135–145)
Total Bilirubin: 0.8 mg/dL (ref 0.3–1.2)
Total Protein: 6.1 g/dL — ABNORMAL LOW (ref 6.5–8.1)

## 2022-10-31 LAB — URINALYSIS, ROUTINE W REFLEX MICROSCOPIC
Bilirubin Urine: NEGATIVE
Glucose, UA: NEGATIVE mg/dL
Hgb urine dipstick: NEGATIVE
Ketones, ur: NEGATIVE mg/dL
Leukocytes,Ua: NEGATIVE
Nitrite: NEGATIVE
Protein, ur: NEGATIVE mg/dL
Specific Gravity, Urine: 1.021 (ref 1.005–1.030)
pH: 5 (ref 5.0–8.0)

## 2022-10-31 LAB — PROTIME-INR
INR: 1.1 (ref 0.8–1.2)
Prothrombin Time: 14.1 seconds (ref 11.4–15.2)

## 2022-10-31 LAB — SURGICAL PCR SCREEN
MRSA, PCR: NEGATIVE
Staphylococcus aureus: NEGATIVE

## 2022-10-31 LAB — APTT: aPTT: 34 seconds (ref 24–36)

## 2022-10-31 NOTE — Progress Notes (Signed)
PCP - Dr. Oliver Barre Cardiologist - Dr. [name]  PPM/ICD - denies   Chest x-ray - 10/31/22 EKG - 10/31/22 Stress Test - denies ECHO - 08/01/22 Cardiac Cath - 08/20/22  Sleep Study - denies   DM- denies  ASA/Blood Thinner Instructions: n/a   ERAS Protcol - no, NPO   COVID TEST- 10/31/22   Anesthesia review: yes, cardiac hx   Patient denies shortness of breath, fever, cough and chest pain at PAT appointment   All instructions explained to the patient, with a verbal understanding of the material. Patient agrees to go over the instructions while at home for a better understanding. Patient also instructed to wear a mask in public after being tested for COVID-19. The opportunity to ask questions was provided.

## 2022-11-01 LAB — SARS CORONAVIRUS 2 (TAT 6-24 HRS): SARS Coronavirus 2: NEGATIVE

## 2022-11-01 LAB — HEMOGLOBIN A1C
Hgb A1c MFr Bld: 5.7 % — ABNORMAL HIGH (ref 4.8–5.6)
Mean Plasma Glucose: 117 mg/dL

## 2022-11-01 MED ORDER — TRANEXAMIC ACID (OHS) PUMP PRIME SOLUTION
2.0000 mg/kg | INTRAVENOUS | Status: DC
  Filled 2022-11-01: qty 1.56

## 2022-11-01 MED ORDER — VANCOMYCIN HCL 1250 MG/250ML IV SOLN
1250.0000 mg | INTRAVENOUS | Status: DC
  Filled 2022-11-01: qty 250

## 2022-11-01 MED ORDER — NITROGLYCERIN IN D5W 200-5 MCG/ML-% IV SOLN
2.0000 ug/min | INTRAVENOUS | Status: DC
  Filled 2022-11-01: qty 250

## 2022-11-01 MED ORDER — INSULIN REGULAR(HUMAN) IN NACL 100-0.9 UT/100ML-% IV SOLN
INTRAVENOUS | Status: DC
  Filled 2022-11-01: qty 100

## 2022-11-01 MED ORDER — MILRINONE LACTATE IN DEXTROSE 20-5 MG/100ML-% IV SOLN
0.3000 ug/kg/min | INTRAVENOUS | Status: DC
  Filled 2022-11-01: qty 100

## 2022-11-01 MED ORDER — PHENYLEPHRINE HCL-NACL 20-0.9 MG/250ML-% IV SOLN
30.0000 ug/min | INTRAVENOUS | Status: DC
  Filled 2022-11-01: qty 250

## 2022-11-01 MED ORDER — MANNITOL 20 % IV SOLN
INTRAVENOUS | Status: DC
  Filled 2022-11-01: qty 13

## 2022-11-01 MED ORDER — NOREPINEPHRINE 4 MG/250ML-% IV SOLN
0.0000 ug/min | INTRAVENOUS | Status: DC
  Filled 2022-11-01: qty 250

## 2022-11-01 MED ORDER — TRANEXAMIC ACID (OHS) BOLUS VIA INFUSION
15.0000 mg/kg | INTRAVENOUS | Status: DC
  Filled 2022-11-01: qty 1167

## 2022-11-01 MED ORDER — DEXMEDETOMIDINE HCL IN NACL 400 MCG/100ML IV SOLN
0.1000 ug/kg/h | INTRAVENOUS | Status: DC
  Filled 2022-11-01: qty 100

## 2022-11-01 MED ORDER — HEPARIN 30,000 UNITS/1000 ML (OHS) CELLSAVER SOLUTION
Status: DC
  Filled 2022-11-01: qty 1000

## 2022-11-01 MED ORDER — CEFAZOLIN SODIUM-DEXTROSE 2-4 GM/100ML-% IV SOLN
2.0000 g | INTRAVENOUS | Status: DC
  Filled 2022-11-01: qty 100

## 2022-11-01 MED ORDER — EPINEPHRINE HCL 5 MG/250ML IV SOLN IN NS
0.0000 ug/min | INTRAVENOUS | Status: DC
  Filled 2022-11-01: qty 250

## 2022-11-01 MED ORDER — TRANEXAMIC ACID 1000 MG/10ML IV SOLN
1.5000 mg/kg/h | INTRAVENOUS | Status: DC
  Filled 2022-11-01: qty 25

## 2022-11-01 MED ORDER — POTASSIUM CHLORIDE 2 MEQ/ML IV SOLN
80.0000 meq | INTRAVENOUS | Status: DC
  Filled 2022-11-01: qty 40

## 2022-11-01 MED ORDER — PLASMA-LYTE A IV SOLN
INTRAVENOUS | Status: DC
  Filled 2022-11-01: qty 2.5

## 2022-11-01 NOTE — Progress Notes (Signed)
Case: [id number] Date/Time: 11/04/22 0715   Procedures:      AORTIC VALVE REPLACEMENT (AVR) (Chest)     TRANSESOPHAGEAL ECHOCARDIOGRAM   Anesthesia type: General   Pre-op diagnosis: SEVERE AI   Location: [facility] ROOM [facility] / [facility]   Surgeons: Alleen Borne, MD       DISCUSSION: Christopher Arias is a 62 year old male with a PMH of former smoking, COPD recently on home oxygen at 3 L, mild OSA not on CPAP, and bicuspid aortic valve disease with severe regurgitation. He is now scheduled for AVR with Dr. Laneta Simmers.   Patient had a COPD exacerbation on ~5/2 and saw Pulmonology. He was treated with antibiotics and steroids and has recovered. It has been over 4 weeks since onset of symptoms and he denies significant chest pain, SOB, or cough at PAT visit.    VS: BP (!) 140/87   Pulse 86   Temp 36.7 C   Resp 17   Ht 5\' 5"  (1.651 m)   Wt 77.8 kg   SpO2 97%   BMI 28.56 kg/m   PROVIDERS: Corwin Levins, MD   LABS: Labs reviewed: Acceptable for surgery. (all labs ordered are listed, but only abnormal results are displayed)  Labs Reviewed  COMPREHENSIVE METABOLIC PANEL - Abnormal; Notable for the following components:      Result Value   CO2 18 (*)    Total Protein 6.1 (*)    All other components within normal limits  BLOOD GAS, ARTERIAL - Abnormal; Notable for the following components:   pO2, Arterial 79 (*)    All other components within normal limits  HEMOGLOBIN A1C - Abnormal; Notable for the following components:   Hgb A1c MFr Bld 5.7 (*)    All other components within normal limits  SARS CORONAVIRUS 2 (TAT 6-24 HRS)  SURGICAL PCR SCREEN  CBC  PROTIME-INR  APTT  URINALYSIS, ROUTINE W REFLEX MICROSCOPIC  TYPE AND SCREEN     IMAGES:  CTA chest 10/02/22:  IMPRESSION: 1. Tortuous and aneurysmal right coronary artery measuring up to 1.5 cm in maximal diameter. 2. Mild aneurysmal dilation of the circumflex coronary artery measuring up to 0.6 cm in the atrioventricular groove. 3.  Mildly ectatic left main and proximal left anterior descending coronary arteries. 4. Thickened and presumably functionally bicuspid aortic valve. 5. 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 6. The overall aortic root measures 4.5 cm at the sinuses of Valsalva. 7. No evidence of aortic dissection or aneurysm. 8. Advanced centrilobular pulmonary emphysema with bullous change at the right apex.     EKG 10/31/22:  Sinus rhythm with 1st degree AV block Right superior axis deviation Septal infarct, age undetermined No significant change from prior   CV:  Carotid Duplex US 10/31/22:   Summary:  Right Carotid: The extracranial vessels were near-normal with only minimal  wall                thickening or plaque.   Left Carotid: The extracranial vessels were near-normal with only minimal  wall               thickening or plaque.   Vertebrals:  Bilateral vertebral arteries demonstrate antegrade flow.  Subclavians: Normal flow hemodynamics were seen in bilateral subclavian               arteries.   Right/Left Heart Cath 08/20/22:    Angiographically no significant CAD but proximal vessel aneurysmal and ectatic dilation of both  the Left and Right Coronary Arteries   Normal Right Heart Cath Pressures with minimal pulmonary hypertension.  Normal Cardiac Output-Index   LV end diastolic pressure is normal.     POST-OPERATIVE DIAGNOSIS:   Aneurysmal coronary arteries with no stenosis.   RCA has a low takeoff with very vestibular aneurysmal takeoff and sharp bend.  The entire vessel is aneurysmal and ectatic and provides a PDA with small PL branch Largest ectatic/aneurysmal left main trifurcates into LAD, RI and LCx  LAD is ectatic in nature but then tapers to normal size vessel gives a very proximal diagonal branch that is also large caliber, no stenoses RI is also a normal/large-caliber vessel that reaches the apex LCx takes a very sharp angular takeoff from the  ectatic left main is normal in size but before dilating into an aneurysmal segment just proximal to the takeoff of the AV groove.  After this there is are OM1 and 2 branches that are free of disease, the AV groove branch gives off LPL 1 there is relatively small Right Heart Cath numbers: RAP 4 mmHg, RV P-EDP 32/3-10 mmHg; PAP-mean 33/22-26 mmHg; PCWP 27 mmHg. LVP-EDP 132/1-12 million mercury.  AOP-MAP 128/70-98 mmHg. Ao sat 97%, PA sat 71%.  CO-CI (Fick) 4.47-2.51.     RECOMMENDATIONS Agree with referral for aortic valve replacement, but no CABG required.   TEE 08/01/22:  IMPRESSIONS     1. Left ventricular ejection fraction, by estimation, is 60 to 65%. The  left ventricle has normal function. The left ventricular internal cavity  size was mildly dilated. Left ventricular diastolic function could not be  evaluated.   2. Right ventricular systolic function is normal. The right ventricular  size is normal.   3. Left atrial size was mildly dilated. No left atrial/left atrial  appendage thrombus was detected.   4. The mitral valve is normal in structure. No evidence of mitral valve  regurgitation.   5. Bicuspid Sievers type 1 valve with severe eccentric AR PISA radius 1  cm, holodiastolic reversals in ascending thoracic aorta, ERO 0.52 cm 2,  and Regurgitant volume 67 cc. The aortic valve is bicuspid. Aortic valve  regurgitation is severe.   6. Aortic dilatation noted. There is moderate dilatation of the aortic  root, measuring 40 mm.     Past Medical History:  Diagnosis Date   ANTERIOR PITUITARY HYPERFUNCTION 11/23/2009   ANXIETY 11/24/2007   COPD (chronic obstructive pulmonary disease) (HCC) 05/31/2021   GERD 11/24/2007   GYNECOMASTIA 11/10/2009   HYPERLIPIDEMIA 11/24/2007   HYPOTHYROIDISM, POST-RADIATION 03/16/2010   PONV (postoperative nausea and vomiting)    Severe aortic insufficiency     Past Surgical History:  Procedure Laterality Date   HEMORRHOID SURGERY      INGUINAL HERNIA REPAIR Bilateral    RIGHT/LEFT HEART CATH AND CORONARY ANGIOGRAPHY N/A 08/20/2022   Procedure: RIGHT/LEFT HEART CATH AND CORONARY ANGIOGRAPHY;  Surgeon: Marykay Lex, MD;  Location: [facility] INVASIVE CV LAB;  Service: Cardiovascular;  Laterality: N/A;   TEE WITHOUT CARDIOVERSION N/A 08/01/2022   Procedure: TRANSESOPHAGEAL ECHOCARDIOGRAM (TEE);  Surgeon: Wendall Stade, MD;  Location: [facility] ENDOSCOPY;  Service: Cardiovascular;  Laterality: N/A;    MEDICATIONS:  albuterol (PROVENTIL) (2.5 MG/3ML) 0.083% nebulizer solution   albuterol (VENTOLIN HFA) 108 (90 Base) MCG/ACT inhaler   amoxicillin-clavulanate (AUGMENTIN) 875-125 MG tablet   atorvastatin (LIPITOR) 20 MG tablet   Budeson-Glycopyrrol-Formoterol (BREZTRI AEROSPHERE) 160-9-4.8 MCG/ACT AERO   clonazePAM (KLONOPIN) 1 MG tablet   diphenhydramine-acetaminophen (TYLENOL PM) 25-500 MG TABS  tablet   levothyroxine (SYNTHROID) 100 MCG tablet   OXYGEN   predniSONE (DELTASONE) 20 MG tablet   sertraline (ZOLOFT) 100 MG tablet   varenicline (CHANTIX) 1 MG tablet   No current facility-administered medications for this encounter.   Marcille Blanco [facility] Short Stay/Anesthesiology [facility] Phone [phone] 11/01/2022 9:19 AM

## 2022-11-01 NOTE — Anesthesia Preprocedure Evaluation (Signed)
Anesthesia Evaluation  Patient identified by MRN, date of birth, ID band Patient awake    Reviewed: Allergy & Precautions, H&P , NPO status , Patient's Chart, lab work & pertinent test results  History of Anesthesia Complications (+) PONV and history of anesthetic complications  Airway Mallampati: II  TM Distance: >3 FB Neck ROM: Full    Dental no notable dental hx. (+) Teeth Intact, Dental Advisory Given   Pulmonary asthma , sleep apnea , COPD,  COPD inhaler, former smoker   Pulmonary exam normal breath sounds clear to auscultation       Cardiovascular Exercise Tolerance: Good + Valvular Problems/Murmurs AI  Rhythm:Regular Rate:Normal     Neuro/Psych   Anxiety Depression    negative neurological ROS     GI/Hepatic Neg liver ROS,GERD  ,,  Endo/Other  Hypothyroidism    Renal/GU negative Renal ROS  negative genitourinary   Musculoskeletal   Abdominal   Peds  Hematology negative hematology ROS (+)   Anesthesia Other Findings   Reproductive/Obstetrics negative OB ROS                             Anesthesia Physical Anesthesia Plan  ASA: 4  Anesthesia Plan: General   Post-op Pain Management: Tylenol PO (pre-op)*   Induction: Intravenous  PONV Risk Score and Plan: 3 and Ondansetron, Dexamethasone and Midazolam  Airway Management Planned: Oral ETT  Additional Equipment: Arterial line, CVP, PA Cath, TEE and Ultrasound Guidance Line Placement  Intra-op Plan:   Post-operative Plan: Post-operative intubation/ventilation  Informed Consent: I have reviewed the patients History and Physical, chart, labs and discussed the procedure including the risks, benefits and alternatives for the proposed anesthesia with the patient or authorized representative who has indicated his/her understanding and acceptance.     Dental advisory given  Plan Discussed with: CRNA  Anesthesia Plan Comments:  (See PAT note from 5/30 by Sherlie Ban PA-C )        Anesthesia Quick Evaluation

## 2022-11-01 NOTE — H&P (Signed)
[address]       [name] [address]             [phone]      Cardiothoracic Surgery Admission History and Physical   PCP is Corwin Levins, MD Referring Provider is Little Ishikawa*       Chief Complaint  Patient presents with   Aortic Insuffiency      Cardiac cath 3/19, TEE 2/29, ECHO 2/8, PFTs 3/11      HPI:   The patient is a 62 year old gentleman with a history of hyperlipidemia, hypothyroidism on replacement therapy, previous smoking with COPD recently on home oxygen at 3 L, mild OSA not on CPAP, and bicuspid aortic valve disease with severe regurgitation who was referred for consideration of valve replacement.  He reports a several month history of shortness of breath and was hospitalized in December 2023 with acute hypoxic respiratory failure in the setting of RSV infection.  He recovered from this but has continued to have exertional fatigue and shortness of breath.  2D echo on 07/11/2022 was felt to show mild to moderate aortic insufficiency with a mean gradient of 4 mmHg.  There was a question of a small density on the aortic valve but visualization was suboptimal.  The aorta appeared to be of normal size with a aortic root diameter of 2.95 cm.  Left ventricular ejection fraction was 60 to 65%.  He subsequently had a TEE performed on 08/01/2022 which showed a type I bicuspid aortic valve with severe eccentric aortic insufficiency with a PISA radius of 1 cm, ERO of 0.52 cm, regurgitant volume 67 cc, and holodiastolic reversal in the ascending aorta.  The aortic root diameter was 40 mm.  Cardiac catheterization was performed on 08/20/2022 and showed large ectatic coronary arteries with no significant stenosis.  PA pressure is mildly elevated with normal LVEDP.  CTA of the chest showed the aortic root to have a diameter of 4.5 cm at the sinus of valsalva level with no effacement of the STJ and normal ascending, arch and descending aorta. I don't think  his aorta needs to be replaced. There are aneurysmal changes of the RCA and mid LCx with mild ectasia of the left main and proximal LAD but this can not be changed.   The patient lives with his wife.  He said that he feels fairly well overall but still has some exertional shortness of breath and fatigue.  He has been able to walk his dog without oxygen.  He denies any peripheral edema.  He denies chest pain or pressure.  He denies any dizziness or syncope.     Past Medical History:  Diagnosis Date   ANTERIOR PITUITARY HYPERFUNCTION 11/23/2009   ANXIETY 11/24/2007   COPD (chronic obstructive pulmonary disease) 05/31/2021   GERD 11/24/2007   GYNECOMASTIA 11/10/2009   HYPERLIPIDEMIA 11/24/2007   HYPOTHYROIDISM, POST-RADIATION 03/16/2010           Past Surgical History:  Procedure Laterality Date   INGUINAL HERNIA REPAIR       RIGHT/LEFT HEART CATH AND CORONARY ANGIOGRAPHY N/A 08/20/2022    Procedure: RIGHT/LEFT HEART CATH AND CORONARY ANGIOGRAPHY;  Surgeon: Marykay Lex, MD;  Location: [facility] INVASIVE CV LAB;  Service: Cardiovascular;  Laterality: N/A;   TEE WITHOUT CARDIOVERSION N/A 08/01/2022    Procedure: TRANSESOPHAGEAL ECHOCARDIOGRAM (TEE);  Surgeon: Wendall Stade, MD;  Location: [facility] ENDOSCOPY;  Service: Cardiovascular;  Laterality: N/A;  Family History  Problem Relation Age of Onset   Heart disease Father     Stroke Other     COPD Other     Heart disease Other     Colon cancer Neg Hx        Social History Social History         Tobacco Use   Smoking status: Former      Packs/day: 1.00      Years: 20.00      Additional pack years: 0.00      Total pack years: 20.00      Types: Cigarettes      Quit date: 08/24/2021      Years since quitting: 1.0   Smokeless tobacco: Never  Substance Use Topics   Alcohol use: Yes      Comment: rarely   Drug use: No            Current Outpatient Medications  Medication Sig Dispense Refill   albuterol (VENTOLIN HFA) 108 (90  Base) MCG/ACT inhaler Inhale 2 puffs into the lungs every 6 (six) hours as needed for wheezing or shortness of breath. 18 g 3   atorvastatin (LIPITOR) 20 MG tablet Take 1 tablet (20 mg total) by mouth daily. 90 tablet 3   Budeson-Glycopyrrol-Formoterol (BREZTRI AEROSPHERE) 160-9-4.8 MCG/ACT AERO Inhale 2 puffs into the lungs in the morning and at bedtime. 10.7 g 5   clonazePAM (KLONOPIN) 1 MG tablet TAKE 1 TABLET BY MOUTH 2 TIMES DAILY AS NEEDED. 60 tablet 2   diphenhydramine-acetaminophen (TYLENOL PM) 25-500 MG TABS tablet Take 2 tablets by mouth at bedtime.       levothyroxine (SYNTHROID) 100 MCG tablet Take 1 tablet (100 mcg total) by mouth daily. 90 tablet 3   OXYGEN Inhale 2-3 L into the lungs at bedtime. As needed during the day       sertraline (ZOLOFT) 100 MG tablet Take 2 tablets (200 mg total) by mouth daily. (Patient taking differently: Take 200 mg by mouth at bedtime.) 180 tablet 3   varenicline (CHANTIX) 1 MG tablet TAKE 1 TABLET BY MOUTH TWICE A DAY (Patient taking differently: Take 1 mg by mouth daily.) 180 tablet 2    No current facility-administered medications for this visit.           Allergies  Allergen Reactions   Atenolol Other (See Comments)      Nose bleeds      Review of Systems  Constitutional:  Positive for fatigue.  HENT:         Last dental visit was in June 2023.  No active dental issues.  Eyes: Negative.   Respiratory:  Positive for shortness of breath.        Has orthopnea  Cardiovascular:  Negative for palpitations and leg swelling.  Gastrointestinal:        Reflux and difficulty swallowing.  Endocrine: Negative.   Genitourinary: Negative.   Musculoskeletal:  Positive for arthralgias and myalgias.  Skin: Negative.   Allergic/Immunologic: Negative.   Neurological:  Positive for headaches. Negative for dizziness and syncope.  Hematological:  Bruises/bleeds easily.  Psychiatric/Behavioral:  The patient is nervous/anxious.       BP 119/83 (BP  Location: Left Arm, Patient Position: Sitting)   Pulse 87   Resp 20   Ht 5\' 4"  (1.626 m)   Wt 140 lb (63.5 kg)   SpO2 92% Comment: RA  BMI 24.03 kg/m  Physical Exam Constitutional:      Appearance: Normal appearance. He  is normal weight.  HENT:     Head: Normocephalic and atraumatic.     Mouth/Throat:     Mouth: Mucous membranes are moist.     Pharynx: Oropharynx is clear.  Eyes:     Extraocular Movements: Extraocular movements intact.     Conjunctiva/sclera: Conjunctivae normal.     Pupils: Pupils are equal, round, and reactive to light.  Cardiovascular:     Rate and Rhythm: Normal rate and regular rhythm.     Pulses: Normal pulses.     Heart sounds: Murmur heard.     Comments: 2/6 systolic murmur along the right sternal border.  I do not hear a diastolic murmur but S2 is very quiet Pulmonary:     Effort: Pulmonary effort is normal.     Breath sounds: Normal breath sounds.  Abdominal:     General: Bowel sounds are normal. There is no distension.     Palpations: Abdomen is soft.  Musculoskeletal:        General: No swelling. Normal range of motion.     Cervical back: Normal range of motion and neck supple.     Comments: Mild pectus excavatum  Skin:    General: Skin is warm and dry.  Neurological:     General: No focal deficit present.     Mental Status: He is alert and oriented to person, place, and time.  Psychiatric:        Mood and Affect: Mood normal.        Behavior: Behavior normal.          Diagnostic Tests:   TRANSESOPHOGEAL ECHO REPORT       Patient Name:   Christopher Arias Date of Exam: 08/01/2022  Medical Rec #:  [id number]    Height:       64.0 in  Accession #:    [id number]   Weight:       170.8 lb  Date of Birth:  1960-07-27   BSA:          1.829 m  Patient Age:    61 years     BP:           140/50 mmHg  Patient Gender: M            HR:           78 bpm.  Exam Location:  Inpatient   Procedure: Transesophageal Echo   Indications:    Abnormal AV     History:        Patient has prior history of Echocardiogram examinations.  Aortic                 Valve Disease.    Sonographer:    Eulah Pont RDCS  Referring Phys: Wendall Stade   PROCEDURE: The transesophogeal probe was passed without difficulty through  the esophogus of the patient. Sedation performed by different physician.  The patient developed no complications during the procedure.    IMPRESSIONS     1. Left ventricular ejection fraction, by estimation, is 60 to 65%. The  left ventricle has normal function. The left ventricular internal cavity  size was mildly dilated. Left ventricular diastolic function could not be  evaluated.   2. Right ventricular systolic function is normal. The right ventricular  size is normal.   3. Left atrial size was mildly dilated. No left atrial/left atrial  appendage thrombus was detected.   4. The mitral valve is normal in structure. No evidence of  mitral valve  regurgitation.   5. Bicuspid Sievers type 1 valve with severe eccentric AR PISA radius 1  cm, holodiastolic reversals in ascending thoracic aorta, ERO 0.52 cm 2,  and Regurgitant volume 67 cc. The aortic valve is bicuspid. Aortic valve  regurgitation is severe.   6. Aortic dilatation noted. There is moderate dilatation of the aortic  root, measuring 40 mm.   FINDINGS   Left Ventricle: Left ventricular ejection fraction, by estimation, is 60  to 65%. The left ventricle has normal function. The left ventricular  internal cavity size was mildly dilated. There is no left ventricular  hypertrophy. Left ventricular diastolic  function could not be evaluated.   Right Ventricle: The right ventricular size is normal. No increase in  right ventricular wall thickness. Right ventricular systolic function is  normal.   Left Atrium: Left atrial size was mildly dilated. No left atrial/left  atrial appendage thrombus was detected.   Right Atrium: Right atrial size was normal in  size.   Pericardium: There is no evidence of pericardial effusion.   Mitral Valve: The mitral valve is normal in structure. No evidence of  mitral valve regurgitation.   Tricuspid Valve: The tricuspid valve is normal in structure. Tricuspid  valve regurgitation is mild.   Aortic Valve: Bicuspid Sievers type 1 valve with severe eccentric AR PISA  radius 1 cm, holodiastolic reversals in ascending thoracic aorta, ERO 0.52  cm 2, and Regurgitant volume 67 cc. The aortic valve is bicuspid. Aortic  valve regurgitation is severe.  Aortic regurgitation PHT measures 277 msec. Aortic valve mean gradient  measures 6.0 mmHg. Aortic valve peak gradient measures 11.4 mmHg. Aortic  valve area, by VTI measures 2.84 cm.   Pulmonic Valve: The pulmonic valve was normal in structure. Pulmonic valve  regurgitation is trivial.   Aorta: Aortic dilatation noted. There is moderate dilatation of the aortic  root, measuring 40 mm.   IAS/Shunts: No atrial level shunt detected by color flow Doppler.     LEFT VENTRICLE  PLAX 2D  LVOT diam:     2.60 cm  LV SV:         83  LV SV Index:   45  LVOT Area:     5.31 cm     AORTIC VALVE  AV Area (Vmax):    2.82 cm  AV Area (Vmean):   2.36 cm  AV Area (VTI):     2.84 cm  AV Vmax:           169.00 cm/s  AV Vmean:          118.000 cm/s  AV VTI:            0.292 m  AV Peak Grad:      11.4 mmHg  AV Mean Grad:      6.0 mmHg  LVOT Vmax:         89.90 cm/s  LVOT Vmean:        52.400 cm/s  LVOT VTI:          0.156 m  LVOT/AV VTI ratio: 0.53  AI PHT:            277 msec     SHUNTS  Systemic VTI:  0.16 m  Systemic Diam: 2.60 cm   Charlton Haws MD  Electronically signed by Charlton Haws MD  Signature Date/Time: 08/01/2022/11:32:47 AM        Final      Physicians   Panel Physicians Referring Physician Case  Authorizing Physician  Marykay Lex, MD (Primary)        Procedures   RIGHT/LEFT HEART CATH AND CORONARY ANGIOGRAPHY    Conclusion        Angiographically no significant CAD but proximal vessel aneurysmal and ectatic dilation of both the Left and Right Coronary Arteries   Normal Right Heart Cath Pressures with minimal pulmonary hypertension.  Normal Cardiac Output-Index   LV end diastolic pressure is normal.     POST-OPERATIVE DIAGNOSIS:   Aneurysmal coronary arteries with no stenosis.   RCA has a low takeoff with very vestibular aneurysmal takeoff and sharp bend.  The entire vessel is aneurysmal and ectatic and provides a PDA with small PL branch Largest ectatic/aneurysmal left main trifurcates into LAD, RI and LCx  LAD is ectatic in nature but then tapers to normal size vessel gives a very proximal diagonal branch that is also large caliber, no stenoses RI is also a normal/large-caliber vessel that reaches the apex LCx takes a very sharp angular takeoff from the ectatic left main is normal in size but before dilating into an aneurysmal segment just proximal to the takeoff of the AV groove.  After this there is are OM1 and 2 branches that are free of disease, the AV groove branch gives off LPL 1 there is relatively small Right Heart Cath numbers: RAP 4 mmHg, RV P-EDP 32/3-10 mmHg; PAP-mean 33/22-26 mmHg; PCWP 27 mmHg. LVP-EDP 132/1-12 million mercury.  AOP-MAP 128/70-98 mmHg. Ao sat 97%, PA sat 71%.  CO-CI (Fick) 4.47-2.51.     RECOMMENDATIONS Agree with referral for aortic valve replacement, but no CABG required.       Tabbetha Kutscher Lemma, MD       Recommendations       Antiplatelet/Anticoag No indication for antiplatelet therapy at this time .  Discharge Date In the absence of any other complications or medical issues, we expect the patient to be ready for discharge from a cath perspective on 08/20/2022.    Surgeon Notes       08/20/2022 11:13 AM Brief Op Note signed by Marykay Lex, MD      2022/08/02 11:25 AM CV Procedure signed by Wendall Stade, MD    Clinical Presentation   CHF/Shock Congestive heart  failure not present. No shock present.    Procedural Details   Technical Details PCP:  Corwin Levins, MD      Cardiologist:  Little Ishikawa, MD   Christopher Arias is a 62 y.o. male with a hx of COPD on home 3L, hypothyroidism, hyperlipidemia, OSA, rectus sheath hematoma who presents for follow-up.  He was referred for evaluation of possible atrial flutter, initially seen 07/15/2022.  EKG 06/21/2022 read as atrial flutter but appears sinus rhythm.  Echocardiogram 07/11/2022 showed EF 60 to 65%, grade 1 diastolic dysfunction, normal RV function, mild to moderate AI, possible small echodensity on aortic valve on subcostal images.  He reports he has been having shortness of breath since his hospitalization in December 2023 with acute hypoxic respiratory failure in setting of RSV infection.  Has been slowly improving.  Denies any chest pain, lightheadedness, syncope, lower extremity edema, or palpitations.  Smoked 43 years, about 0.5ppd, quit age 37.  Father died of MI at 39.    TEE on 08/02/2022 showed EF 60 to 65%, mild LV dilatation, normal RV function, mild left atrial enlargement, bicuspid aortic valve with severe aortic regurgitation, dilated aortic root measuring 40 mm.  He is therefore referred for right  and left heart catheterization as part of preop evaluation for AVR.  Time Out: Verified patient identification, verified procedure, site/side was marked, verified correct patient position, special equipment/implants available, medications/allergies/relevent history reviewed, required imaging and test results available. Performed.  Access:  *RIGHT Radial Artery: 6 Fr sheath -- Seldinger technique using Micropuncture Kit Direct ultrasound guidance used.  Permanent image obtained and placed on chart. 10 mL radial cocktail IA; 4000 Units IV Heparin * Right Brachial/Antecubital Vein: The existing 18-gauge IV was exchanged over a wire for a 5Fr  sheath  Right Heart Catheterization: 5 Fr Theone Murdoch  catheter advanced under fluoroscopy with balloon inflated to the RA, RV, then PCWP-PA for hemodynamic measurement.  * Simultaneous FA & PA blood gases checked for SaO2% to calculate FICK CO/CI  * Catheter removed completely out of the body with balloon deflated.  Left Heart Catheterization: 5Fr Catheters advanced or exchanged over a J-wire under direct fluoroscopic guidance into the ascending aorta; JR4 catheter advanced first.  * LV Hemodynamics (LV Gram): JR4 catheter * Left Coronary Artery Cineangiography: JL3.5 Catheter  * Right Coronary Artery Cineangiography: JR4 Catheter   Upon completion of Angiogaphy, the catheter was removed completely out of the body over a wire, without complication.  Brachial Sheath(s) removed in the Cardiac Catheterization Lab with manual pressure for hemostasis.    Radial sheath removed in the Cardiac Catheterization lab with TR Band placed for hemostasis.  TR Band: 1440  Hours; 13 mL air  MEDICATIONS * SQ Lidocaine 3mL * Radial Cocktail: 3 mg Verapmil in 10 mL NS * Isovue Contrast: 45 * Heparin: 4500 Units Estimated blood loss <50 mL.   During this procedure medications were administered to achieve and maintain moderate conscious sedation while the patient's heart rate, blood pressure, and oxygen saturation were continuously monitored and I was present face-to-face 100% of this time.    Medications (Filter: Administrations occurring from 0956 to 1107 on 08/20/22)  important  Continuous medications are totaled by the amount administered until 08/20/22 1107.    fentaNYL (SUBLIMAZE) injection (mcg)  Total dose: 25 mcg Date/Time Rate/Dose/Volume Action    08/20/22 1006 25 mcg Given    midazolam (VERSED) injection (mg)  Total dose: 1 mg Date/Time Rate/Dose/Volume Action    08/20/22 1006 1 mg Given    Radial Cocktail/Verapamil only (mL)  Total volume: 10 mL Date/Time Rate/Dose/Volume Action    08/20/22 1021 10 mL Given    heparin sodium (porcine)  injection (Units)  Total dose: 4,000 Units Date/Time Rate/Dose/Volume Action    08/20/22 1038 4,000 Units Given    Heparin (Porcine) in NaCl 1000-0.9 UT/500ML-% SOLN (mL)  Total volume: 1,000 mL Date/Time Rate/Dose/Volume Action    08/20/22 1039 1,000 mL Given    lidocaine (PF) (XYLOCAINE) 1 % injection (mL)  Total volume: 5 mL Date/Time Rate/Dose/Volume Action    08/20/22 1039 5 mL Given    iohexol (OMNIPAQUE) 350 MG/ML injection (mL)  Total volume: 120 mL Date/Time Rate/Dose/Volume Action    08/20/22 1102 120 mL Given      Sedation Time   Sedation Time Physician-1: 49 minutes 1 second Contrast        Administrations occurring from 0956 to 1107 on 08/20/22:  Medication Name Total Dose  iohexol (OMNIPAQUE) 350 MG/ML injection 120 mL    Radiation/Fluoro   Fluoro time: 11.6 (min) DAP: 19193 (mGycm2) Cumulative Air Kerma: 364 (mGy) Complications   Complications documented before study signed (08/20/2022  4:01 PM)    No complications were associated with  this study.  Documented by Jannette Spanner - 08/20/2022 10:54 AM      Coronary Findings   Diagnostic Dominance: Right Left Main  Vessel was injected. Vessel is normal in caliber. Vessel is angiographically normal.    Left Anterior Descending  Vessel was injected. Vessel is large. Vessel is angiographically normal.    First Diagonal Branch  Vessel was injected. Vessel is large in size. Vessel is angiographically normal.    Third Diagonal Branch  Vessel was injected. Vessel is small in size. Vessel is angiographically normal.    Ramus Intermedius  Vessel was injected. Vessel is normal in caliber. Vessel is Large Vessel is angiographically normal. The vessel has a moderate aneurysm.    Left Circumflex  Vessel was injected. Vessel is large. Vessel is angiographically normal. The vessel is moderately tortuous. The vessel is moderately ectatic. The vessel has a moderate aneurysm.    Right Coronary Artery  Vessel  was injected. Vessel is large and very large. Vessel is angiographically normal. The vessel is moderately ectatic. The vessel has a moderate aneurysm.    Acute Marginal Branch  Vessel was injected. Vessel is small in size. Vessel is angiographically normal.    Right Ventricular Branch  Vessel was injected. Vessel is small in size. Vessel is angiographically normal.    Right Posterior Atrioventricular Artery  Vessel was injected. Vessel is moderate in size. Vessel is angiographically normal.    First Right Posterolateral Branch  Vessel was injected. Vessel is small in size. Vessel is angiographically normal.    Intervention    No interventions have been documented.    Right Heart   Right Heart Pressures Hemodynamic findings consistent with mild pulmonary hypertension. PAP-mean 33/22-26 mmHg; PCWP mean 26 mmHg -> trivial pulmonary hypertension from elevated PCWP LV EDP is normal. LV P-EDP 132/1-12 mmHg; AoP-MAP 130/76-98 mmHg Ao sat 97%, PA sat 71%. Cardiac Output-Index Hiram Comber) 4.47-2.51  Right Atrium Right atrial pressure is normal. RAP 4 mmHg  Right Ventricle RVP-mean 29/7-10 mmHg    Wall Motion   LV gram not performed           Left Heart   Aortic Valve There is no aortic valve stenosis. Severe AI by TTE he and TEE    Coronary Diagrams   Diagnostic Dominance: Right  Intervention    Implants    No implant documentation for this case.    Syngo Images    Show images for CARDIAC CATHETERIZATION Images on Long Term Storage    Show images for Abhiraj, Donis to Procedure Log   Procedure Log    Hemo Data   Flowsheet Row Most Recent Value  Fick Cardiac Output 4.47 L/min  Fick Cardiac Output Index 2.51 (L/min)/BSA  RA A Wave 6 mmHg  RA V Wave 3 mmHg  RA Mean 3 mmHg  RV Systolic Pressure 29 mmHg  RV Diastolic Pressure 7 mmHg  RV EDP 10 mmHg  PA Systolic Pressure 33 mmHg  PA Diastolic Pressure 22 mmHg  PA Mean 26 mmHg  PW A Wave 29 mmHg  PW V Wave 27 mmHg   PW Mean 23 mmHg  AO Systolic Pressure 130 mmHg  AO Diastolic Pressure 76 mmHg  AO Mean 102 mmHg  LV Systolic Pressure 132 mmHg  LV Diastolic Pressure 1 mmHg  LV EDP 12 mmHg  AOp Systolic Pressure 126 mmHg  AOp Diastolic Pressure 69 mmHg  AOp Mean Pressure 96 mmHg  LVp Systolic Pressure 117 mmHg  LVp Diastolic Pressure  5 mmHg  LVp EDP Pressure 13 mmHg  QP/QS 1  TPVR Index 10.36 HRUI  TSVR Index 40.68 HRUI  PVR SVR Ratio 0.03  TPVR/TSVR Ratio 0.25      Narrative & Impression  CLINICAL DATA:  Preoperative evaluation prior to aortic root/valve surgery.   EXAM: CT ANGIOGRAPHY CHEST WITH CONTRAST   TECHNIQUE: Multidetector CT imaging of the chest was performed using the standard protocol during bolus administration of intravenous contrast. Multiplanar CT image reconstructions and MIPs were obtained to evaluate the vascular anatomy.   RADIATION DOSE REDUCTION: This exam was performed according to the departmental dose-optimization program which includes automated exposure control, adjustment of the mA and/or kV according to patient size and/or use of iterative reconstruction technique.   CONTRAST:  75mL OMNIPAQUE IOHEXOL 350 MG/ML SOLN   COMPARISON:  CT a chest 05/10/2022   FINDINGS: Cardiovascular: The right brachiocephalic and left common carotid artery share a conjoined origin. Thickened and presumably functionally bicuspid aortic valve. The aortic root measures 4.5 cm at the sinuses of Valsalva. No effacement of the sino-tubular junction. The ascending, transverse and descending thoracic aorta are normal in caliber. Aneurysmal dilation of the right coronary artery to 1.5 cm. Similarly, the left main, left anterior descending and circumflex arteries are ectatic. The circumflex artery may be mildly aneurysmal to 0.6 cm in the atrioventricular groove. The heart itself is normal in size. Fibrofatty replacement of the endocardium along the ventricular apex. No  pericardial effusion. Main pulmonary artery is normal in size.   Mediastinum/Nodes: Unremarkable CT appearance of the thyroid gland. No suspicious mediastinal or hilar adenopathy. No soft tissue mediastinal mass. The thoracic esophagus is unremarkable.   Lungs/Pleura: Advanced centrilobular pulmonary emphysema with bullous change at the right lung apex. Diffuse mild lower lobe bronchial wall thickening. Areas of dependent atelectasis are present. No suspicious pulmonary mass or nodule. Tiny 1-2 mm subpleural nodules demonstrate no interval change and are almost certainly benign. No pleural effusion or pneumothorax.   Upper Abdomen: Visualized upper abdominal organs are unremarkable.   Musculoskeletal: No acute fracture or aggressive appearing lytic or blastic osseous lesion.   Review of the MIP images confirms the above findings.   IMPRESSION: 1. Tortuous and aneurysmal right coronary artery measuring up to 1.5 cm in maximal diameter. 2. Mild aneurysmal dilation of the circumflex coronary artery measuring up to 0.6 cm in the atrioventricular groove. 3. Mildly ectatic left main and proximal left anterior descending coronary arteries. 4. Thickened and presumably functionally bicuspid aortic valve. 5. 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 6. The overall aortic root measures 4.5 cm at the sinuses of Valsalva. 7. No evidence of aortic dissection or aneurysm. 8. Advanced centrilobular pulmonary emphysema with bullous change at the right apex.     Electronically Signed   By: Malachy Moan M.D.   On: 10/03/2022 07:23       Impression:   This 62 year old gentleman has a bicuspid aortic valve with severe aortic insufficiency as noted on his TEE.  The aortic root was mildly dilated at 40 mm on TEE.  He has no coronary disease on catheterization.  I agree that aortic valve replacement is indicated in this patient for relief of his symptoms as well as to  prevent progressive left ventricular deterioration.   Pulmonary function testing on 08/12/2022 showed moderate obstruction and a severe diffusion defect.  He has recently been on oxygen at home but does not have to use it all the time and I  think his pulmonary function is adequate to get through surgery.  He would likely need to be on oxygen when he goes home postoperatively.  I reviewed all the studies with the patient and his family and answered their questions.  I recommended use of a bioprosthetic valve. I discussed the operative procedure with the patient and family including alternatives, benefits and risks; including but not limited to bleeding, blood transfusion, infection, stroke, myocardial infarction, graft failure, heart block requiring a permanent pacemaker, organ dysfunction, and death.  Christopher Arias understands and agrees to proceed.       Plan:   Aortic valve replacement using a bioprosthetic valve.      Alleen Borne, MD [facility] [phone]

## 2022-11-03 MED ORDER — CEFAZOLIN SODIUM-DEXTROSE 2-4 GM/100ML-% IV SOLN
2.0000 g | INTRAVENOUS | Status: DC
  Filled 2022-11-03: qty 100

## 2022-11-03 MED ORDER — VANCOMYCIN HCL 1250 MG/250ML IV SOLN
1250.0000 mg | INTRAVENOUS | Status: AC
  Administered 2022-11-04: 1250 mg via INTRAVENOUS
  Filled 2022-11-03 (×2): qty 250

## 2022-11-03 MED ORDER — TRANEXAMIC ACID (OHS) BOLUS VIA INFUSION
15.0000 mg/kg | INTRAVENOUS | Status: AC
  Administered 2022-11-04: 1167 mg via INTRAVENOUS
  Filled 2022-11-03: qty 1167

## 2022-11-03 MED ORDER — NOREPINEPHRINE 4 MG/250ML-% IV SOLN
0.0000 ug/min | INTRAVENOUS | Status: DC
  Filled 2022-11-03 (×2): qty 250

## 2022-11-03 MED ORDER — INSULIN REGULAR(HUMAN) IN NACL 100-0.9 UT/100ML-% IV SOLN
INTRAVENOUS | Status: AC
  Administered 2022-11-04: .6 [IU]/h via INTRAVENOUS
  Filled 2022-11-03: qty 100

## 2022-11-03 MED ORDER — MILRINONE LACTATE IN DEXTROSE 20-5 MG/100ML-% IV SOLN
0.3000 ug/kg/min | INTRAVENOUS | Status: DC
  Filled 2022-11-03 (×2): qty 100

## 2022-11-03 MED ORDER — PHENYLEPHRINE HCL-NACL 20-0.9 MG/250ML-% IV SOLN
30.0000 ug/min | INTRAVENOUS | Status: AC
  Administered 2022-11-04: 50 ug/min via INTRAVENOUS
  Filled 2022-11-03 (×2): qty 250

## 2022-11-03 MED ORDER — CEFAZOLIN SODIUM-DEXTROSE 2-4 GM/100ML-% IV SOLN
2.0000 g | INTRAVENOUS | Status: AC
  Administered 2022-11-04 (×2): 2 g via INTRAVENOUS
  Filled 2022-11-03: qty 100

## 2022-11-03 MED ORDER — TRANEXAMIC ACID 1000 MG/10ML IV SOLN
1.5000 mg/kg/h | INTRAVENOUS | Status: AC
  Administered 2022-11-04: 1.5 mg/kg/h via INTRAVENOUS
  Filled 2022-11-03: qty 25

## 2022-11-03 MED ORDER — DEXMEDETOMIDINE HCL IN NACL 400 MCG/100ML IV SOLN
0.1000 ug/kg/h | INTRAVENOUS | Status: AC
  Administered 2022-11-04: .7 ug/kg/h via INTRAVENOUS
  Filled 2022-11-03 (×2): qty 100

## 2022-11-03 MED ORDER — NITROGLYCERIN IN D5W 200-5 MCG/ML-% IV SOLN
2.0000 ug/min | INTRAVENOUS | Status: DC
  Filled 2022-11-03 (×2): qty 250

## 2022-11-03 MED ORDER — HEPARIN 30,000 UNITS/1000 ML (OHS) CELLSAVER SOLUTION
Status: DC
  Filled 2022-11-03 (×2): qty 1000

## 2022-11-03 MED ORDER — TRANEXAMIC ACID (OHS) PUMP PRIME SOLUTION
2.0000 mg/kg | INTRAVENOUS | Status: DC
  Filled 2022-11-03: qty 1.56

## 2022-11-03 MED ORDER — EPINEPHRINE HCL 5 MG/250ML IV SOLN IN NS
0.0000 ug/min | INTRAVENOUS | Status: DC
  Filled 2022-11-03 (×2): qty 250

## 2022-11-03 MED ORDER — MANNITOL 20 % IV SOLN
INTRAVENOUS | Status: DC
  Filled 2022-11-03 (×2): qty 13

## 2022-11-03 MED ORDER — POTASSIUM CHLORIDE 2 MEQ/ML IV SOLN
80.0000 meq | INTRAVENOUS | Status: DC
  Filled 2022-11-03: qty 40

## 2022-11-03 MED ORDER — PLASMA-LYTE A IV SOLN
INTRAVENOUS | Status: DC
  Filled 2022-11-03: qty 2.5

## 2022-11-04 ENCOUNTER — Encounter (HOSPITAL_COMMUNITY)
Admission: RE | Disposition: A | Discharge status: Home / Self Care | Payer: Self-pay | Source: Home / Self Care | Attending: Surgery

## 2022-11-04 ENCOUNTER — Inpatient Hospital Stay (HOSPITAL_COMMUNITY): Payer: BC Managed Care – PPO | Admitting: Medical

## 2022-11-04 ENCOUNTER — Other Ambulatory Visit: Payer: Self-pay

## 2022-11-04 ENCOUNTER — Inpatient Hospital Stay (HOSPITAL_COMMUNITY): Payer: BC Managed Care – PPO

## 2022-11-04 ENCOUNTER — Encounter (HOSPITAL_COMMUNITY): Payer: Self-pay | Admitting: Surgery

## 2022-11-04 ENCOUNTER — Inpatient Hospital Stay (HOSPITAL_COMMUNITY)
Admission: RE | Disposition: A | Discharge status: Home / Self Care | Payer: Self-pay | Source: Home / Self Care | Attending: Surgery

## 2022-11-04 ENCOUNTER — Inpatient Hospital Stay (HOSPITAL_COMMUNITY)
Admission: RE | Admit: 2022-11-04 | Discharge: 2022-11-14 | DRG: 219 | Disposition: A | Discharge status: Home / Self Care | Payer: BC Managed Care – PPO | Attending: Surgery | Admitting: Surgery

## 2022-11-04 ENCOUNTER — Inpatient Hospital Stay (HOSPITAL_COMMUNITY): Payer: BC Managed Care – PPO | Admitting: Anesthesiology

## 2022-11-04 DIAGNOSIS — Z8249 Family history of ischemic heart disease and other diseases of the circulatory system: Secondary | ICD-10-CM

## 2022-11-04 DIAGNOSIS — Y838 Other surgical procedures as the cause of abnormal reaction of the patient, or of later complication, without mention of misadventure at the time of the procedure: Secondary | ICD-10-CM | POA: Diagnosis not present

## 2022-11-04 DIAGNOSIS — Z79890 Hormone replacement therapy (postmenopausal): Secondary | ICD-10-CM | POA: Diagnosis not present

## 2022-11-04 DIAGNOSIS — J90 Pleural effusion, not elsewhere classified: Secondary | ICD-10-CM | POA: Diagnosis not present

## 2022-11-04 DIAGNOSIS — J449 Chronic obstructive pulmonary disease, unspecified: Secondary | ICD-10-CM | POA: Diagnosis present

## 2022-11-04 DIAGNOSIS — J9811 Atelectasis: Secondary | ICD-10-CM | POA: Diagnosis not present

## 2022-11-04 DIAGNOSIS — E039 Hypothyroidism, unspecified: Secondary | ICD-10-CM | POA: Diagnosis present

## 2022-11-04 DIAGNOSIS — D696 Thrombocytopenia, unspecified: Secondary | ICD-10-CM | POA: Diagnosis present

## 2022-11-04 DIAGNOSIS — Z79899 Other long term (current) drug therapy: Secondary | ICD-10-CM

## 2022-11-04 DIAGNOSIS — F419 Anxiety disorder, unspecified: Secondary | ICD-10-CM | POA: Diagnosis present

## 2022-11-04 DIAGNOSIS — Z8619 Personal history of other infectious and parasitic diseases: Secondary | ICD-10-CM

## 2022-11-04 DIAGNOSIS — R001 Bradycardia, unspecified: Secondary | ICD-10-CM | POA: Diagnosis present

## 2022-11-04 DIAGNOSIS — J9621 Acute and chronic respiratory failure with hypoxia: Secondary | ICD-10-CM | POA: Diagnosis present

## 2022-11-04 DIAGNOSIS — I351 Nonrheumatic aortic (valve) insufficiency: Principal | ICD-10-CM | POA: Diagnosis present

## 2022-11-04 DIAGNOSIS — E785 Hyperlipidemia, unspecified: Secondary | ICD-10-CM | POA: Diagnosis present

## 2022-11-04 DIAGNOSIS — K5900 Constipation, unspecified: Secondary | ICD-10-CM | POA: Diagnosis not present

## 2022-11-04 DIAGNOSIS — Z87891 Personal history of nicotine dependence: Secondary | ICD-10-CM

## 2022-11-04 DIAGNOSIS — Y92230 Patient room in hospital as the place of occurrence of the external cause: Secondary | ICD-10-CM | POA: Diagnosis not present

## 2022-11-04 DIAGNOSIS — D689 Coagulation defect, unspecified: Secondary | ICD-10-CM | POA: Diagnosis present

## 2022-11-04 DIAGNOSIS — E8770 Fluid overload, unspecified: Secondary | ICD-10-CM | POA: Diagnosis not present

## 2022-11-04 DIAGNOSIS — D62 Acute posthemorrhagic anemia: Secondary | ICD-10-CM | POA: Diagnosis not present

## 2022-11-04 DIAGNOSIS — K219 Gastro-esophageal reflux disease without esophagitis: Secondary | ICD-10-CM | POA: Diagnosis present

## 2022-11-04 DIAGNOSIS — Z952 Presence of prosthetic heart valve: Principal | ICD-10-CM

## 2022-11-04 DIAGNOSIS — Z9981 Dependence on supplemental oxygen: Secondary | ICD-10-CM

## 2022-11-04 DIAGNOSIS — Z923 Personal history of irradiation: Secondary | ICD-10-CM

## 2022-11-04 DIAGNOSIS — I97638 Postprocedural hematoma of a circulatory system organ or structure following other circulatory system procedure: Secondary | ICD-10-CM

## 2022-11-04 DIAGNOSIS — G4733 Obstructive sleep apnea (adult) (pediatric): Secondary | ICD-10-CM | POA: Diagnosis present

## 2022-11-04 DIAGNOSIS — S27892A Contusion of other specified intrathoracic organs, initial encounter: Secondary | ICD-10-CM | POA: Diagnosis not present

## 2022-11-04 DIAGNOSIS — R339 Retention of urine, unspecified: Secondary | ICD-10-CM | POA: Diagnosis not present

## 2022-11-04 DIAGNOSIS — R0602 Shortness of breath: Secondary | ICD-10-CM | POA: Diagnosis present

## 2022-11-04 DIAGNOSIS — I442 Atrioventricular block, complete: Secondary | ICD-10-CM | POA: Diagnosis present

## 2022-11-04 DIAGNOSIS — Z825 Family history of asthma and other chronic lower respiratory diseases: Secondary | ICD-10-CM | POA: Diagnosis not present

## 2022-11-04 DIAGNOSIS — I4719 Other supraventricular tachycardia: Secondary | ICD-10-CM | POA: Diagnosis present

## 2022-11-04 DIAGNOSIS — I358 Other nonrheumatic aortic valve disorders: Secondary | ICD-10-CM | POA: Diagnosis not present

## 2022-11-04 DIAGNOSIS — Z8701 Personal history of pneumonia (recurrent): Secondary | ICD-10-CM

## 2022-11-04 DIAGNOSIS — Z888 Allergy status to other drugs, medicaments and biological substances status: Secondary | ICD-10-CM

## 2022-11-04 HISTORY — PX: TEE WITHOUT CARDIOVERSION: SHX5443

## 2022-11-04 HISTORY — PX: EXPLORATION POST OPERATIVE OPEN HEART: SHX5061

## 2022-11-04 HISTORY — PX: AORTIC VALVE REPLACEMENT: SHX41

## 2022-11-04 LAB — GLUCOSE, CAPILLARY
Glucose-Capillary: 114 mg/dL — ABNORMAL HIGH (ref 70–99)
Glucose-Capillary: 138 mg/dL — ABNORMAL HIGH (ref 70–99)
Glucose-Capillary: 110 mg/dL — ABNORMAL HIGH (ref 70–99)
Glucose-Capillary: 91 mg/dL (ref 70–99)
Glucose-Capillary: 91 mg/dL (ref 70–99)
Glucose-Capillary: 103 mg/dL — ABNORMAL HIGH (ref 70–99)
Glucose-Capillary: 136 mg/dL — ABNORMAL HIGH (ref 70–99)
Glucose-Capillary: 121 mg/dL — ABNORMAL HIGH (ref 70–99)
Glucose-Capillary: 106 mg/dL — ABNORMAL HIGH (ref 70–99)

## 2022-11-04 LAB — POCT I-STAT 7, (LYTES, BLD GAS, ICA,H+H)
Acid-base deficit: 3 mmol/L — ABNORMAL HIGH (ref 0.0–2.0)
Acid-base deficit: 4 mmol/L — ABNORMAL HIGH (ref 0.0–2.0)
Acid-base deficit: 7 mmol/L — ABNORMAL HIGH (ref 0.0–2.0)
Acid-base deficit: 3 mmol/L — ABNORMAL HIGH (ref 0.0–2.0)
Acid-base deficit: 3 mmol/L — ABNORMAL HIGH (ref 0.0–2.0)
Acid-base deficit: 4 mmol/L — ABNORMAL HIGH (ref 0.0–2.0)
Acid-base deficit: 4 mmol/L — ABNORMAL HIGH (ref 0.0–2.0)
Acid-base deficit: 4 mmol/L — ABNORMAL HIGH (ref 0.0–2.0)
Bicarbonate: 21.8 mmol/L (ref 20.0–28.0)
Bicarbonate: 21 mmol/L (ref 20.0–28.0)
Bicarbonate: 20.4 mmol/L (ref 20.0–28.0)
Bicarbonate: 22.6 mmol/L (ref 20.0–28.0)
Bicarbonate: 22.7 mmol/L (ref 20.0–28.0)
Bicarbonate: 22.7 mmol/L (ref 20.0–28.0)
Bicarbonate: 22.4 mmol/L (ref 20.0–28.0)
Bicarbonate: 22.1 mmol/L (ref 20.0–28.0)
Calcium, Ion: 1.03 mmol/L — ABNORMAL LOW (ref 1.15–1.40)
Calcium, Ion: 1.05 mmol/L — ABNORMAL LOW (ref 1.15–1.40)
Calcium, Ion: 1.08 mmol/L — ABNORMAL LOW (ref 1.15–1.40)
Calcium, Ion: 0.98 mmol/L — ABNORMAL LOW (ref 1.15–1.40)
Calcium, Ion: 1.08 mmol/L — ABNORMAL LOW (ref 1.15–1.40)
Calcium, Ion: 1.07 mmol/L — ABNORMAL LOW (ref 1.15–1.40)
Calcium, Ion: 1.04 mmol/L — ABNORMAL LOW (ref 1.15–1.40)
Calcium, Ion: 1.05 mmol/L — ABNORMAL LOW (ref 1.15–1.40)
HCT: 28 % — ABNORMAL LOW (ref 39.0–52.0)
HCT: 31 % — ABNORMAL LOW (ref 39.0–52.0)
HCT: 35 % — ABNORMAL LOW (ref 39.0–52.0)
HCT: 27 % — ABNORMAL LOW (ref 39.0–52.0)
HCT: 23 % — ABNORMAL LOW (ref 39.0–52.0)
HCT: 22 % — ABNORMAL LOW (ref 39.0–52.0)
HCT: 23 % — ABNORMAL LOW (ref 39.0–52.0)
HCT: 23 % — ABNORMAL LOW (ref 39.0–52.0)
Hemoglobin: 9.5 g/dL — ABNORMAL LOW (ref 13.0–17.0)
Hemoglobin: 10.5 g/dL — ABNORMAL LOW (ref 13.0–17.0)
Hemoglobin: 11.9 g/dL — ABNORMAL LOW (ref 13.0–17.0)
Hemoglobin: 9.2 g/dL — ABNORMAL LOW (ref 13.0–17.0)
Hemoglobin: 7.8 g/dL — ABNORMAL LOW (ref 13.0–17.0)
Hemoglobin: 7.5 g/dL — ABNORMAL LOW (ref 13.0–17.0)
Hemoglobin: 7.8 g/dL — ABNORMAL LOW (ref 13.0–17.0)
Hemoglobin: 7.8 g/dL — ABNORMAL LOW (ref 13.0–17.0)
O2 Saturation: 100 %
O2 Saturation: 100 %
O2 Saturation: 98 %
O2 Saturation: 100 %
O2 Saturation: 100 %
O2 Saturation: 99 %
O2 Saturation: 94 %
O2 Saturation: 97 %
Patient temperature: 36
Patient temperature: 36.2
Patient temperature: 35.9
Patient temperature: 36.8
Patient temperature: 37.6
Potassium: 3.8 mmol/L (ref 3.5–5.1)
Potassium: 5.1 mmol/L (ref 3.5–5.1)
Potassium: 4.4 mmol/L (ref 3.5–5.1)
Potassium: 4.3 mmol/L (ref 3.5–5.1)
Potassium: 4.2 mmol/L (ref 3.5–5.1)
Potassium: 4.4 mmol/L (ref 3.5–5.1)
Potassium: 4.3 mmol/L (ref 3.5–5.1)
Potassium: 4.2 mmol/L (ref 3.5–5.1)
Sodium: 137 mmol/L (ref 135–145)
Sodium: 134 mmol/L — ABNORMAL LOW (ref 135–145)
Sodium: 138 mmol/L (ref 135–145)
Sodium: 139 mmol/L (ref 135–145)
Sodium: 140 mmol/L (ref 135–145)
Sodium: 139 mmol/L (ref 135–145)
Sodium: 139 mmol/L (ref 135–145)
Sodium: 140 mmol/L (ref 135–145)
TCO2: 23 mmol/L (ref 22–32)
TCO2: 22 mmol/L (ref 22–32)
TCO2: 22 mmol/L (ref 22–32)
TCO2: 24 mmol/L (ref 22–32)
TCO2: 24 mmol/L (ref 22–32)
TCO2: 24 mmol/L (ref 22–32)
TCO2: 24 mmol/L (ref 22–32)
TCO2: 23 mmol/L (ref 22–32)
pCO2 arterial: 38.7 mmHg (ref 32–48)
pCO2 arterial: 36.1 mmHg (ref 32–48)
pCO2 arterial: 45.1 mmHg (ref 32–48)
pCO2 arterial: 39.2 mmHg (ref 32–48)
pCO2 arterial: 44.2 mmHg (ref 32–48)
pCO2 arterial: 46 mmHg (ref 32–48)
pCO2 arterial: 43.2 mmHg (ref 32–48)
pCO2 arterial: 42.7 mmHg (ref 32–48)
pH, Arterial: 7.359 (ref 7.35–7.45)
pH, Arterial: 7.373 (ref 7.35–7.45)
pH, Arterial: 7.257 — ABNORMAL LOW (ref 7.35–7.45)
pH, Arterial: 7.365 (ref 7.35–7.45)
pH, Arterial: 7.318 — ABNORMAL LOW (ref 7.35–7.45)
pH, Arterial: 7.296 — ABNORMAL LOW (ref 7.35–7.45)
pH, Arterial: 7.321 — ABNORMAL LOW (ref 7.35–7.45)
pH, Arterial: 7.324 — ABNORMAL LOW (ref 7.35–7.45)
pO2, Arterial: 408 mmHg — ABNORMAL HIGH (ref 83–108)
pO2, Arterial: 323 mmHg — ABNORMAL HIGH (ref 83–108)
pO2, Arterial: 117 mmHg — ABNORMAL HIGH (ref 83–108)
pO2, Arterial: 394 mmHg — ABNORMAL HIGH (ref 83–108)
pO2, Arterial: 376 mmHg — ABNORMAL HIGH (ref 83–108)
pO2, Arterial: 164 mmHg — ABNORMAL HIGH (ref 83–108)
pO2, Arterial: 77 mmHg — ABNORMAL LOW (ref 83–108)
pO2, Arterial: 102 mmHg (ref 83–108)

## 2022-11-04 LAB — ECHO INTRAOPERATIVE TEE
AV Mean grad: 5 mmHg
AV Peak grad: 9 mmHg
Ao pk vel: 1.5 m/s
Height: 65 in
P 1/2 time: 410 msec
Weight: 2744.29 oz

## 2022-11-04 LAB — TYPE AND SCREEN
Antibody Screen: NEGATIVE
Unit division: 0

## 2022-11-04 LAB — POCT I-STAT, CHEM 8
BUN: 20 mg/dL (ref 8–23)
BUN: 20 mg/dL (ref 8–23)
BUN: 19 mg/dL (ref 8–23)
BUN: 19 mg/dL (ref 8–23)
BUN: 19 mg/dL (ref 8–23)
BUN: 14 mg/dL (ref 8–23)
Calcium, Ion: 1.29 mmol/L (ref 1.15–1.40)
Calcium, Ion: 1.25 mmol/L (ref 1.15–1.40)
Calcium, Ion: 1.09 mmol/L — ABNORMAL LOW (ref 1.15–1.40)
Calcium, Ion: 1.08 mmol/L — ABNORMAL LOW (ref 1.15–1.40)
Calcium, Ion: 1.1 mmol/L — ABNORMAL LOW (ref 1.15–1.40)
Calcium, Ion: 1.07 mmol/L — ABNORMAL LOW (ref 1.15–1.40)
Chloride: 104 mmol/L (ref 98–111)
Chloride: 103 mmol/L (ref 98–111)
Chloride: 104 mmol/L (ref 98–111)
Chloride: 104 mmol/L (ref 98–111)
Chloride: 104 mmol/L (ref 98–111)
Chloride: 106 mmol/L (ref 98–111)
Creatinine, Ser: 0.6 mg/dL — ABNORMAL LOW (ref 0.61–1.24)
Creatinine, Ser: 0.6 mg/dL — ABNORMAL LOW (ref 0.61–1.24)
Creatinine, Ser: 0.7 mg/dL (ref 0.61–1.24)
Creatinine, Ser: 0.6 mg/dL — ABNORMAL LOW (ref 0.61–1.24)
Creatinine, Ser: 0.7 mg/dL (ref 0.61–1.24)
Creatinine, Ser: 0.6 mg/dL — ABNORMAL LOW (ref 0.61–1.24)
Glucose, Bld: 107 mg/dL — ABNORMAL HIGH (ref 70–99)
Glucose, Bld: 116 mg/dL — ABNORMAL HIGH (ref 70–99)
Glucose, Bld: 98 mg/dL (ref 70–99)
Glucose, Bld: 117 mg/dL — ABNORMAL HIGH (ref 70–99)
Glucose, Bld: 121 mg/dL — ABNORMAL HIGH (ref 70–99)
Glucose, Bld: 100 mg/dL — ABNORMAL HIGH (ref 70–99)
HCT: 38 % — ABNORMAL LOW (ref 39.0–52.0)
HCT: 35 % — ABNORMAL LOW (ref 39.0–52.0)
HCT: 30 % — ABNORMAL LOW (ref 39.0–52.0)
HCT: 29 % — ABNORMAL LOW (ref 39.0–52.0)
HCT: 30 % — ABNORMAL LOW (ref 39.0–52.0)
HCT: 26 % — ABNORMAL LOW (ref 39.0–52.0)
Hemoglobin: 12.9 g/dL — ABNORMAL LOW (ref 13.0–17.0)
Hemoglobin: 11.9 g/dL — ABNORMAL LOW (ref 13.0–17.0)
Hemoglobin: 10.2 g/dL — ABNORMAL LOW (ref 13.0–17.0)
Hemoglobin: 10.2 g/dL — ABNORMAL LOW (ref 13.0–17.0)
Hemoglobin: 9.9 g/dL — ABNORMAL LOW (ref 13.0–17.0)
Hemoglobin: 8.8 g/dL — ABNORMAL LOW (ref 13.0–17.0)
Potassium: 4.1 mmol/L (ref 3.5–5.1)
Potassium: 3.7 mmol/L (ref 3.5–5.1)
Potassium: 5 mmol/L (ref 3.5–5.1)
Potassium: 4.9 mmol/L (ref 3.5–5.1)
Potassium: 5.4 mmol/L — ABNORMAL HIGH (ref 3.5–5.1)
Potassium: 4.3 mmol/L (ref 3.5–5.1)
Sodium: 136 mmol/L (ref 135–145)
Sodium: 136 mmol/L (ref 135–145)
Sodium: 135 mmol/L (ref 135–145)
Sodium: 133 mmol/L — ABNORMAL LOW (ref 135–145)
Sodium: 135 mmol/L (ref 135–145)
Sodium: 140 mmol/L (ref 135–145)
TCO2: 25 mmol/L (ref 22–32)
TCO2: 24 mmol/L (ref 22–32)
TCO2: 24 mmol/L (ref 22–32)
TCO2: 24 mmol/L (ref 22–32)
TCO2: 24 mmol/L (ref 22–32)
TCO2: 24 mmol/L (ref 22–32)

## 2022-11-04 LAB — BASIC METABOLIC PANEL
Anion gap: 7 (ref 5–15)
BUN: 14 mg/dL (ref 8–23)
CO2: 22 mmol/L (ref 22–32)
Calcium: 6.7 mg/dL — ABNORMAL LOW (ref 8.9–10.3)
Chloride: 108 mmol/L (ref 98–111)
Creatinine, Ser: 0.76 mg/dL (ref 0.61–1.24)
GFR, Estimated: >60 mL/min (ref >60)
Glucose, Bld: 91 mg/dL (ref 70–99)
Potassium: 4.3 mmol/L (ref 3.5–5.1)
Sodium: 137 mmol/L (ref 135–145)

## 2022-11-04 LAB — CBC
HCT: 37.1 % — ABNORMAL LOW (ref 39.0–52.0)
HCT: 26.3 % — ABNORMAL LOW (ref 39.0–52.0)
Hemoglobin: 12.2 g/dL — ABNORMAL LOW (ref 13.0–17.0)
Hemoglobin: 8.7 g/dL — ABNORMAL LOW (ref 13.0–17.0)
MCH: 29 pg (ref 26.0–34.0)
MCH: 29.7 pg (ref 26.0–34.0)
MCHC: 32.9 g/dL (ref 30.0–36.0)
MCHC: 33.1 g/dL (ref 30.0–36.0)
MCV: 88.1 fL (ref 80.0–100.0)
MCV: 89.8 fL (ref 80.0–100.0)
Platelets: 143 10*3/uL — ABNORMAL LOW (ref 150–400)
Platelets: 95 10*3/uL — ABNORMAL LOW (ref 150–400)
RBC: 4.21 MIL/uL — ABNORMAL LOW (ref 4.22–5.81)
RBC: 2.93 MIL/uL — ABNORMAL LOW (ref 4.22–5.81)
RDW: 13.5 % (ref 11.5–15.5)
RDW: 13.4 % (ref 11.5–15.5)
WBC: 15.6 10*3/uL — ABNORMAL HIGH (ref 4.0–10.5)
WBC: 6.7 10*3/uL (ref 4.0–10.5)
nRBC: 0 % (ref 0.0–0.2)
nRBC: 0 % (ref 0.0–0.2)

## 2022-11-04 LAB — BPAM RBC: Unit Type and Rh: 6200

## 2022-11-04 LAB — POCT I-STAT EG7
Acid-base deficit: 3 mmol/L — ABNORMAL HIGH (ref 0.0–2.0)
Bicarbonate: 23 mmol/L (ref 20.0–28.0)
Calcium, Ion: 1.07 mmol/L — ABNORMAL LOW (ref 1.15–1.40)
HCT: 29 % — ABNORMAL LOW (ref 39.0–52.0)
Hemoglobin: 9.9 g/dL — ABNORMAL LOW (ref 13.0–17.0)
O2 Saturation: 78 %
Potassium: 3.7 mmol/L (ref 3.5–5.1)
Sodium: 137 mmol/L (ref 135–145)
TCO2: 24 mmol/L (ref 22–32)
pCO2, Ven: 44.3 mmHg (ref 44–60)
pH, Ven: 7.323 (ref 7.25–7.43)
pO2, Ven: 46 mmHg — ABNORMAL HIGH (ref 32–45)

## 2022-11-04 LAB — PROTIME-INR
INR: 1.6 — ABNORMAL HIGH (ref 0.8–1.2)
INR: 1.6 — ABNORMAL HIGH (ref 0.8–1.2)
Prothrombin Time: 18.8 seconds — ABNORMAL HIGH (ref 11.4–15.2)
Prothrombin Time: 19.1 seconds — ABNORMAL HIGH (ref 11.4–15.2)

## 2022-11-04 LAB — BPAM FFP
Blood Product Expiration Date: 202406082359
Blood Product Expiration Date: 202406082359
ISSUE DATE / TIME: 202406031646
Unit Type and Rh: 6200

## 2022-11-04 LAB — PREPARE FRESH FROZEN PLASMA: Unit division: 0

## 2022-11-04 LAB — APTT
aPTT: 53 seconds — ABNORMAL HIGH (ref 24–36)
aPTT: 46 seconds — ABNORMAL HIGH (ref 24–36)

## 2022-11-04 LAB — HEMOGLOBIN AND HEMATOCRIT, BLOOD
HCT: 32.4 % — ABNORMAL LOW (ref 39.0–52.0)
Hemoglobin: 11 g/dL — ABNORMAL LOW (ref 13.0–17.0)

## 2022-11-04 LAB — PLATELET COUNT: Platelets: 119 10*3/uL — ABNORMAL LOW (ref 150–400)

## 2022-11-04 LAB — MAGNESIUM: Magnesium: 2.6 mg/dL — ABNORMAL HIGH (ref 1.7–2.4)

## 2022-11-04 LAB — ABO/RH: ABO/RH(D): A POS

## 2022-11-04 LAB — PREPARE RBC (CROSSMATCH)

## 2022-11-04 SURGERY — REPLACEMENT, AORTIC VALVE, OPEN
Anesthesia: General | Site: Chest

## 2022-11-04 SURGERY — EXPLORATION POST OPERATIVE OPEN HEART
Anesthesia: General | Site: Chest

## 2022-11-04 MED ORDER — PROTAMINE SULFATE 10 MG/ML IV SOLN
INTRAVENOUS | Status: DC | PRN
  Administered 2022-11-04: 200 mg via INTRAVENOUS
  Administered 2022-11-04: 20 mg via INTRAVENOUS

## 2022-11-04 MED ORDER — ORAL CARE MOUTH RINSE: 15.0000 mL | OROMUCOSAL | Status: DC | PRN

## 2022-11-04 MED ORDER — SODIUM CHLORIDE 0.9% FLUSH: 3.0000 mL | INTRAVENOUS | Status: DC | PRN

## 2022-11-04 MED ORDER — SODIUM BICARBONATE 8.4 % IV SOLN
INTRAVENOUS | Status: AC
  Administered 2022-11-04: 50 meq via INTRAVENOUS
  Filled 2022-11-04: qty 50

## 2022-11-04 MED ORDER — HEMOSTATIC AGENTS (NO CHARGE) OPTIME
TOPICAL | Status: DC | PRN
  Administered 2022-11-04: 1 via TOPICAL

## 2022-11-04 MED ORDER — METOPROLOL TARTRATE 12.5 MG HALF TABLET: 12.5000 mg | ORAL_TABLET | Freq: Two times a day (BID) | ORAL | Status: DC

## 2022-11-04 MED ORDER — FENTANYL CITRATE (PF) 250 MCG/5ML IJ SOLN
INTRAMUSCULAR | Status: DC | PRN
  Administered 2022-11-04 (×2): 50 ug via INTRAVENOUS

## 2022-11-04 MED ORDER — LACTATED RINGERS IV SOLN: INTRAVENOUS | Status: DC | PRN

## 2022-11-04 MED ORDER — SODIUM CHLORIDE 0.9% IV SOLUTION: Freq: Once | INTRAVENOUS | Status: DC

## 2022-11-04 MED ORDER — 0.9 % SODIUM CHLORIDE (POUR BTL) OPTIME
TOPICAL | Status: DC | PRN
  Administered 2022-11-04: 5000 mL

## 2022-11-04 MED ORDER — HEPARIN SODIUM (PORCINE) 1000 UNIT/ML IJ SOLN
INTRAMUSCULAR | Status: DC | PRN
  Administered 2022-11-04: 25000 [IU] via INTRAVENOUS

## 2022-11-04 MED ORDER — ALBUMIN HUMAN 5 % IV SOLN: INTRAVENOUS | Status: DC | PRN

## 2022-11-04 MED ORDER — CHLORHEXIDINE GLUCONATE 0.12 % MT SOLN
15.0000 mL | OROMUCOSAL | Status: AC
  Administered 2022-11-04: 15 mL via OROMUCOSAL
  Filled 2022-11-04: qty 15

## 2022-11-04 MED ORDER — ATORVASTATIN CALCIUM 10 MG PO TABS
20.0000 mg | ORAL_TABLET | Freq: Every day | ORAL | Status: DC
  Administered 2022-11-05 – 2022-11-14 (×10): 20 mg via ORAL
  Filled 2022-11-04 (×10): qty 2

## 2022-11-04 MED ORDER — MIDAZOLAM HCL (PF) 5 MG/ML IJ SOLN
INTRAMUSCULAR | Status: DC | PRN
  Administered 2022-11-04 (×2): 2 mg via INTRAVENOUS
  Administered 2022-11-04: 3 mg via INTRAVENOUS
  Administered 2022-11-04: 2 mg via INTRAVENOUS
  Administered 2022-11-04: 1 mg via INTRAVENOUS

## 2022-11-04 MED ORDER — ~~LOC~~ CARDIAC SURGERY, PATIENT & FAMILY EDUCATION
Freq: Once | Status: DC
  Filled 2022-11-04: qty 1

## 2022-11-04 MED ORDER — ROCURONIUM BROMIDE 10 MG/ML (PF) SYRINGE
PREFILLED_SYRINGE | INTRAVENOUS | Status: DC | PRN
  Administered 2022-11-04 (×2): 50 mg via INTRAVENOUS

## 2022-11-04 MED ORDER — CHLORHEXIDINE GLUCONATE 0.12 % MT SOLN
15.0000 mL | Freq: Once | OROMUCOSAL | Status: DC
  Filled 2022-11-04: qty 15

## 2022-11-04 MED ORDER — SODIUM CHLORIDE 0.45 % IV SOLN: INTRAVENOUS | Status: DC | PRN

## 2022-11-04 MED ORDER — SODIUM BICARBONATE 8.4 % IV SOLN: 50.0000 meq | Freq: Once | INTRAVENOUS | Status: AC

## 2022-11-04 MED ORDER — DEXMEDETOMIDINE HCL IN NACL 400 MCG/100ML IV SOLN
0.0000 ug/kg/h | INTRAVENOUS | Status: DC
  Administered 2022-11-04: 0.7 ug/kg/h via INTRAVENOUS
  Filled 2022-11-04 (×2): qty 100

## 2022-11-04 MED ORDER — THROMBIN 20000 UNITS EX SOLR
CUTANEOUS | Status: DC | PRN
  Administered 2022-11-04: 20000 [IU] via TOPICAL

## 2022-11-04 MED ORDER — SODIUM CHLORIDE 0.9% IV SOLUTION: Freq: Once | INTRAVENOUS | Status: AC

## 2022-11-04 MED ORDER — METOPROLOL TARTRATE 25 MG/10 ML ORAL SUSPENSION: 12.5000 mg | Freq: Two times a day (BID) | ORAL | Status: DC

## 2022-11-04 MED ORDER — UMECLIDINIUM BROMIDE 62.5 MCG/ACT IN AEPB
1.0000 | INHALATION_SPRAY | Freq: Every day | RESPIRATORY_TRACT | Status: DC
  Administered 2022-11-05 – 2022-11-14 (×9): 1 via RESPIRATORY_TRACT
  Filled 2022-11-04 (×2): qty 7

## 2022-11-04 MED ORDER — ORAL CARE MOUTH RINSE: 15.0000 mL | Freq: Once | OROMUCOSAL | Status: AC

## 2022-11-04 MED ORDER — SODIUM BICARBONATE 8.4 % IV SOLN
100.0000 meq | Freq: Once | INTRAVENOUS | Status: AC
  Administered 2022-11-04: 100 meq via INTRAVENOUS

## 2022-11-04 MED ORDER — THROMBIN (RECOMBINANT) 20000 UNITS EX SOLR
CUTANEOUS | Status: AC
  Filled 2022-11-04: qty 20000

## 2022-11-04 MED ORDER — ASPIRIN 325 MG PO TBEC: 325.0000 mg | DELAYED_RELEASE_TABLET | Freq: Every day | ORAL | Status: DC

## 2022-11-04 MED ORDER — NITROGLYCERIN IN D5W 200-5 MCG/ML-% IV SOLN: 0.0000 ug/min | INTRAVENOUS | Status: DC

## 2022-11-04 MED ORDER — ALBUTEROL SULFATE (2.5 MG/3ML) 0.083% IN NEBU: 2.5000 mg | INHALATION_SOLUTION | Freq: Four times a day (QID) | RESPIRATORY_TRACT | Status: DC | PRN

## 2022-11-04 MED ORDER — LACTATED RINGERS IV SOLN
500.0000 mL | Freq: Once | INTRAVENOUS | Status: AC | PRN
  Administered 2022-11-05: 500 mL via INTRAVENOUS

## 2022-11-04 MED ORDER — MIDAZOLAM HCL (PF) 10 MG/2ML IJ SOLN
INTRAMUSCULAR | Status: AC
  Filled 2022-11-04: qty 2

## 2022-11-04 MED ORDER — PANTOPRAZOLE SODIUM 40 MG PO TBEC
40.0000 mg | DELAYED_RELEASE_TABLET | Freq: Every day | ORAL | Status: DC
  Administered 2022-11-06 – 2022-11-14 (×9): 40 mg via ORAL
  Filled 2022-11-04 (×9): qty 1

## 2022-11-04 MED ORDER — DOCUSATE SODIUM 100 MG PO CAPS
200.0000 mg | ORAL_CAPSULE | Freq: Every day | ORAL | Status: DC
  Administered 2022-11-05 – 2022-11-08 (×4): 200 mg via ORAL
  Filled 2022-11-04 (×4): qty 2

## 2022-11-04 MED ORDER — BISACODYL 5 MG PO TBEC
10.0000 mg | DELAYED_RELEASE_TABLET | Freq: Every day | ORAL | Status: DC
  Administered 2022-11-05 – 2022-11-08 (×4): 10 mg via ORAL
  Filled 2022-11-04 (×4): qty 2

## 2022-11-04 MED ORDER — PHENYLEPHRINE HCL-NACL 20-0.9 MG/250ML-% IV SOLN
0.0000 ug/min | INTRAVENOUS | Status: DC
  Administered 2022-11-04: 50 ug/min via INTRAVENOUS
  Administered 2022-11-04: 150 ug/min via INTRAVENOUS
  Administered 2022-11-04: 60 ug/min via INTRAVENOUS
  Administered 2022-11-05 (×2): 170 ug/min via INTRAVENOUS
  Administered 2022-11-05: 220 ug/min via INTRAVENOUS
  Filled 2022-11-04 (×4): qty 250

## 2022-11-04 MED ORDER — ALBUMIN HUMAN 5 % IV SOLN
250.0000 mL | INTRAVENOUS | Status: AC | PRN
  Administered 2022-11-04 (×4): 12.5 g via INTRAVENOUS
  Filled 2022-11-04 (×2): qty 250

## 2022-11-04 MED ORDER — ACETAMINOPHEN 500 MG PO TABS
1000.0000 mg | ORAL_TABLET | Freq: Once | ORAL | Status: AC
  Administered 2022-11-04: 1000 mg via ORAL
  Filled 2022-11-04: qty 2

## 2022-11-04 MED ORDER — MAGNESIUM SULFATE 4 GM/100ML IV SOLN
4.0000 g | Freq: Once | INTRAVENOUS | Status: AC
  Administered 2022-11-04: 4 g via INTRAVENOUS
  Filled 2022-11-04: qty 100

## 2022-11-04 MED ORDER — LACTATED RINGERS IV SOLN: INTRAVENOUS | Status: DC

## 2022-11-04 MED ORDER — VANCOMYCIN HCL IN DEXTROSE 1-5 GM/200ML-% IV SOLN
1000.0000 mg | Freq: Once | INTRAVENOUS | Status: AC
  Administered 2022-11-04: 1000 mg via INTRAVENOUS
  Filled 2022-11-04: qty 200

## 2022-11-04 MED ORDER — VARENICLINE TARTRATE 1 MG PO TABS
1.0000 mg | ORAL_TABLET | Freq: Every day | ORAL | Status: DC
  Administered 2022-11-05 – 2022-11-14 (×10): 1 mg via ORAL
  Filled 2022-11-04 (×10): qty 1

## 2022-11-04 MED ORDER — ACETAMINOPHEN 500 MG PO TABS
1000.0000 mg | ORAL_TABLET | Freq: Four times a day (QID) | ORAL | Status: AC
  Administered 2022-11-04 – 2022-11-09 (×17): 1000 mg via ORAL
  Filled 2022-11-04 (×19): qty 2

## 2022-11-04 MED ORDER — CHLORHEXIDINE GLUCONATE CLOTH 2 % EX PADS
6.0000 | MEDICATED_PAD | Freq: Every day | CUTANEOUS | Status: DC
  Administered 2022-11-04 – 2022-11-08 (×5): 6 via TOPICAL

## 2022-11-04 MED ORDER — OXYCODONE HCL 5 MG PO TABS
5.0000 mg | ORAL_TABLET | ORAL | Status: DC | PRN
  Administered 2022-11-04: 5 mg via ORAL
  Administered 2022-11-05 – 2022-11-14 (×31): 10 mg via ORAL
  Filled 2022-11-04 (×19): qty 2
  Filled 2022-11-04: qty 1
  Filled 2022-11-04 (×12): qty 2

## 2022-11-04 MED ORDER — ASPIRIN 81 MG PO CHEW: 324.0000 mg | CHEWABLE_TABLET | Freq: Every day | ORAL | Status: DC

## 2022-11-04 MED ORDER — PLASMA-LYTE A IV SOLN
INTRAVENOUS | Status: DC | PRN
  Administered 2022-11-04: 500 mL

## 2022-11-04 MED ORDER — INSULIN REGULAR(HUMAN) IN NACL 100-0.9 UT/100ML-% IV SOLN: INTRAVENOUS | Status: DC

## 2022-11-04 MED ORDER — 0.9 % SODIUM CHLORIDE (POUR BTL) OPTIME
TOPICAL | Status: DC | PRN
  Administered 2022-11-04: 3000 mL

## 2022-11-04 MED ORDER — CEFAZOLIN SODIUM-DEXTROSE 2-4 GM/100ML-% IV SOLN
2.0000 g | Freq: Three times a day (TID) | INTRAVENOUS | Status: AC
  Administered 2022-11-04 – 2022-11-06 (×6): 2 g via INTRAVENOUS
  Filled 2022-11-04 (×6): qty 100

## 2022-11-04 MED ORDER — FENTANYL CITRATE (PF) 250 MCG/5ML IJ SOLN
INTRAMUSCULAR | Status: AC
  Filled 2022-11-04: qty 5

## 2022-11-04 MED ORDER — BUDESON-GLYCOPYRROL-FORMOTEROL 160-9-4.8 MCG/ACT IN AERO: 2.0000 | INHALATION_SPRAY | Freq: Two times a day (BID) | RESPIRATORY_TRACT | Status: DC

## 2022-11-04 MED ORDER — POTASSIUM CHLORIDE 10 MEQ/50ML IV SOLN: 10.0000 meq | INTRAVENOUS | Status: AC

## 2022-11-04 MED ORDER — FENTANYL CITRATE (PF) 250 MCG/5ML IJ SOLN
INTRAMUSCULAR | Status: DC | PRN
  Administered 2022-11-04: 50 ug via INTRAVENOUS
  Administered 2022-11-04 (×3): 100 ug via INTRAVENOUS
  Administered 2022-11-04: 250 ug via INTRAVENOUS
  Administered 2022-11-04: 100 ug via INTRAVENOUS
  Administered 2022-11-04: 150 ug via INTRAVENOUS

## 2022-11-04 MED ORDER — SODIUM CHLORIDE 0.9 % IV SOLN: INTRAVENOUS | Status: DC | PRN

## 2022-11-04 MED ORDER — PROPOFOL 10 MG/ML IV BOLUS
INTRAVENOUS | Status: AC
  Filled 2022-11-04: qty 20

## 2022-11-04 MED ORDER — SERTRALINE HCL 100 MG PO TABS
200.0000 mg | ORAL_TABLET | Freq: Every day | ORAL | Status: DC
  Administered 2022-11-05 – 2022-11-13 (×8): 200 mg via ORAL
  Filled 2022-11-04 (×9): qty 2

## 2022-11-04 MED ORDER — ONDANSETRON HCL 4 MG/2ML IJ SOLN
4.0000 mg | Freq: Four times a day (QID) | INTRAMUSCULAR | Status: DC | PRN
  Administered 2022-11-05 – 2022-11-12 (×6): 4 mg via INTRAVENOUS
  Filled 2022-11-04 (×8): qty 2

## 2022-11-04 MED ORDER — METOPROLOL TARTRATE 5 MG/5ML IV SOLN
2.5000 mg | INTRAVENOUS | Status: DC | PRN
  Administered 2022-11-05: 2.5 mg via INTRAVENOUS
  Filled 2022-11-04: qty 5

## 2022-11-04 MED ORDER — ACETAMINOPHEN 650 MG RE SUPP
650.0000 mg | Freq: Once | RECTAL | Status: AC
  Administered 2022-11-04: 650 mg via RECTAL

## 2022-11-04 MED ORDER — ACETAMINOPHEN 160 MG/5ML PO SOLN: 650.0000 mg | Freq: Once | ORAL | Status: AC

## 2022-11-04 MED ORDER — MORPHINE SULFATE (PF) 2 MG/ML IV SOLN
1.0000 mg | INTRAVENOUS | Status: DC | PRN
  Administered 2022-11-04 (×2): 2 mg via INTRAVENOUS
  Administered 2022-11-04: 4 mg via INTRAVENOUS
  Administered 2022-11-04: 2 mg via INTRAVENOUS
  Administered 2022-11-05 (×5): 4 mg via INTRAVENOUS
  Filled 2022-11-04: qty 1
  Filled 2022-11-04 (×2): qty 2
  Filled 2022-11-04: qty 1
  Filled 2022-11-04 (×3): qty 2
  Filled 2022-11-04: qty 1
  Filled 2022-11-04: qty 2

## 2022-11-04 MED ORDER — THROMBIN 5000 UNITS EX SOLR
CUTANEOUS | Status: AC
  Filled 2022-11-04: qty 5000

## 2022-11-04 MED ORDER — PHENYLEPHRINE 80 MCG/ML (10ML) SYRINGE FOR IV PUSH (FOR BLOOD PRESSURE SUPPORT)
PREFILLED_SYRINGE | INTRAVENOUS | Status: DC | PRN
  Administered 2022-11-04: 160 ug via INTRAVENOUS
  Administered 2022-11-04: 80 ug via INTRAVENOUS
  Administered 2022-11-04 (×2): 160 ug via INTRAVENOUS
  Administered 2022-11-04: 80 ug via INTRAVENOUS
  Administered 2022-11-04: 160 ug via INTRAVENOUS

## 2022-11-04 MED ORDER — CHLORHEXIDINE GLUCONATE 0.12 % MT SOLN
15.0000 mL | Freq: Once | OROMUCOSAL | Status: AC
  Administered 2022-11-04: 15 mL via OROMUCOSAL

## 2022-11-04 MED ORDER — THROMBIN 20000 UNITS EX SOLR: OROMUCOSAL | Status: DC | PRN

## 2022-11-04 MED ORDER — METOPROLOL TARTRATE 12.5 MG HALF TABLET
12.5000 mg | ORAL_TABLET | Freq: Once | ORAL | Status: AC
  Administered 2022-11-04: 12.5 mg via ORAL
  Filled 2022-11-04: qty 1

## 2022-11-04 MED ORDER — FLUTICASONE FUROATE-VILANTEROL 200-25 MCG/ACT IN AEPB
1.0000 | INHALATION_SPRAY | Freq: Every day | RESPIRATORY_TRACT | Status: DC
  Administered 2022-11-05 – 2022-11-14 (×9): 1 via RESPIRATORY_TRACT
  Filled 2022-11-04: qty 28

## 2022-11-04 MED ORDER — ROCURONIUM BROMIDE 10 MG/ML (PF) SYRINGE
PREFILLED_SYRINGE | INTRAVENOUS | Status: DC | PRN
  Administered 2022-11-04: 50 mg via INTRAVENOUS
  Administered 2022-11-04: 20 mg via INTRAVENOUS
  Administered 2022-11-04: 80 mg via INTRAVENOUS

## 2022-11-04 MED ORDER — FAMOTIDINE IN NACL 20-0.9 MG/50ML-% IV SOLN
20.0000 mg | Freq: Two times a day (BID) | INTRAVENOUS | Status: AC
  Administered 2022-11-04: 20 mg via INTRAVENOUS
  Filled 2022-11-04: qty 50

## 2022-11-04 MED ORDER — LEVOTHYROXINE SODIUM 100 MCG PO TABS
100.0000 ug | ORAL_TABLET | Freq: Every day | ORAL | Status: DC
  Administered 2022-11-05 – 2022-11-14 (×10): 100 ug via ORAL
  Filled 2022-11-04 (×10): qty 1

## 2022-11-04 MED ORDER — SODIUM BICARBONATE 8.4 % IV SOLN
50.0000 meq | Freq: Once | INTRAVENOUS | Status: AC
  Administered 2022-11-04: 50 meq via INTRAVENOUS

## 2022-11-04 MED ORDER — SODIUM CHLORIDE 0.9% FLUSH
3.0000 mL | Freq: Two times a day (BID) | INTRAVENOUS | Status: DC
  Administered 2022-11-05 – 2022-11-08 (×7): 3 mL via INTRAVENOUS

## 2022-11-04 MED ORDER — SODIUM CHLORIDE 0.9 % IV SOLN
250.0000 mL | INTRAVENOUS | Status: DC
  Administered 2022-11-06: 250 mL via INTRAVENOUS

## 2022-11-04 MED ORDER — ACETAMINOPHEN 160 MG/5ML PO SOLN: 1000.0000 mg | Freq: Four times a day (QID) | ORAL | Status: AC

## 2022-11-04 MED ORDER — CHLORHEXIDINE GLUCONATE 4 % EX LIQD
30.0000 mL | CUTANEOUS | Status: DC
  Filled 2022-11-04: qty 30

## 2022-11-04 MED ORDER — PROPOFOL 10 MG/ML IV BOLUS
INTRAVENOUS | Status: DC | PRN
  Administered 2022-11-04: 20 mg via INTRAVENOUS
  Administered 2022-11-04: 70 mg via INTRAVENOUS

## 2022-11-04 MED ORDER — TRAMADOL HCL 50 MG PO TABS
50.0000 mg | ORAL_TABLET | ORAL | Status: DC | PRN
  Administered 2022-11-05 (×3): 100 mg via ORAL
  Administered 2022-11-05: 50 mg via ORAL
  Administered 2022-11-06: 100 mg via ORAL
  Administered 2022-11-06: 50 mg via ORAL
  Administered 2022-11-06 – 2022-11-09 (×3): 100 mg via ORAL
  Filled 2022-11-04 (×7): qty 2
  Filled 2022-11-04 (×2): qty 1
  Filled 2022-11-04: qty 2

## 2022-11-04 MED ORDER — DEXTROSE 50 % IV SOLN: 0.0000 mL | INTRAVENOUS | Status: DC | PRN

## 2022-11-04 MED ORDER — EPHEDRINE SULFATE-NACL 50-0.9 MG/10ML-% IV SOSY: PREFILLED_SYRINGE | INTRAVENOUS | Status: DC | PRN

## 2022-11-04 MED ORDER — MIDAZOLAM HCL 2 MG/2ML IJ SOLN
2.0000 mg | INTRAMUSCULAR | Status: DC | PRN
  Administered 2022-11-04 (×2): 2 mg via INTRAVENOUS
  Filled 2022-11-04 (×2): qty 2

## 2022-11-04 MED ORDER — SODIUM CHLORIDE 0.9 % IV SOLN: INTRAVENOUS | Status: DC

## 2022-11-04 MED ORDER — THROMBIN 20000 UNITS EX SOLR
OROMUCOSAL | Status: DC | PRN
  Administered 2022-11-04 (×3): 3 mL via TOPICAL

## 2022-11-04 MED ORDER — ORAL CARE MOUTH RINSE
15.0000 mL | OROMUCOSAL | Status: DC
  Administered 2022-11-04 – 2022-11-05 (×3): 15 mL via OROMUCOSAL

## 2022-11-04 MED ORDER — BISACODYL 10 MG RE SUPP: 10.0000 mg | Freq: Every day | RECTAL | Status: DC

## 2022-11-04 MED ORDER — CLONAZEPAM 0.5 MG PO TABS
1.0000 mg | ORAL_TABLET | Freq: Two times a day (BID) | ORAL | Status: DC | PRN
  Administered 2022-11-04 – 2022-11-14 (×16): 1 mg via ORAL
  Filled 2022-11-04: qty 1
  Filled 2022-11-04: qty 2
  Filled 2022-11-04: qty 1
  Filled 2022-11-04 (×6): qty 2
  Filled 2022-11-04: qty 1
  Filled 2022-11-04: qty 2
  Filled 2022-11-04: qty 1
  Filled 2022-11-04 (×3): qty 2
  Filled 2022-11-04: qty 1
  Filled 2022-11-04: qty 2

## 2022-11-04 SURGICAL SUPPLY — 103 items
ADAPTER CARDIO PERF ANTE/RETRO (ADAPTER) ×2 IMPLANT
ADAPTER MULTI PERFUSION 15 (ADAPTER) IMPLANT
ADPR CRDPLG 7.5 .25D 1 LRG Y (ADAPTER) ×2
ADPR PRFSN 84XANTGRD RTRGD (ADAPTER) ×2
ADPR TBG 2 MALE LL ART (MISCELLANEOUS) ×4
BAG DECANTER FOR FLEXI CONT (MISCELLANEOUS) ×2 IMPLANT
BLADE CLIPPER SURG (BLADE) ×2 IMPLANT
BLADE STERNUM SYSTEM 6 (BLADE) ×2 IMPLANT
BLADE SURG 15 STRL LF DISP TIS (BLADE) ×2 IMPLANT
BLADE SURG 15 STRL SS (BLADE) ×2
BRUSH SCRUB EZ PLAIN DRY (MISCELLANEOUS) IMPLANT
CANISTER SUCT 3000ML PPV (MISCELLANEOUS) ×2 IMPLANT
CANNULA AORTIC ROOT 9FR (CANNULA) IMPLANT
CANNULA ARTERIAL VENT 3/8 20FR (CANNULA) IMPLANT
CANNULA GUNDRY RCSP 15FR (MISCELLANEOUS) ×2 IMPLANT
CANNULA MC2 2 STG 36/46 NON-V (CANNULA) IMPLANT
CATH HEART VENT LEFT (CATHETERS) ×2 IMPLANT
CATH ROBINSON RED A/P 18FR (CATHETERS) ×6 IMPLANT
CATH THORACIC 36FR (CATHETERS) ×2 IMPLANT
CATH THORACIC 36FR RT ANG (CATHETERS) ×2 IMPLANT
CNTNR URN SCR LID CUP LEK RST (MISCELLANEOUS) ×2 IMPLANT
CONT SPEC 4OZ STRL OR WHT (MISCELLANEOUS) ×2
CONTAINER PROTECT SURGISLUSH (MISCELLANEOUS) ×4 IMPLANT
COVER SURGICAL LIGHT HANDLE (MISCELLANEOUS) ×2 IMPLANT
DEVICE SUT CK QUICK LOAD INDV (Prosthesis & Implant Heart) IMPLANT
DEVICE SUT CK QUICK LOAD MINI (Prosthesis & Implant Heart) IMPLANT
DRAPE CARDIOVASCULAR INCISE (DRAPES) ×2
DRAPE SRG 135X102X78XABS (DRAPES) ×2 IMPLANT
DRAPE WARM FLUID 44X44 (DRAPES) ×2 IMPLANT
DRSG COVADERM 4X14 (GAUZE/BANDAGES/DRESSINGS) ×2 IMPLANT
ELECT CAUTERY BLADE 6.4 (BLADE) ×2 IMPLANT
ELECT REM PT RETURN 9FT ADLT (ELECTROSURGICAL) ×4
ELECT SOLID GEL RDN PRO-PADZ (MISCELLANEOUS) ×2
ELECTRODE REM PT RTRN 9FT ADLT (ELECTROSURGICAL) ×4 IMPLANT
ELECTRODE SOLI GEL RDN PROPADZ (MISCELLANEOUS) IMPLANT
FELT TEFLON 1X6 (MISCELLANEOUS) ×4 IMPLANT
GAUZE 4X4 16PLY ~~LOC~~+RFID DBL (SPONGE) ×2 IMPLANT
GAUZE SPONGE 4X4 12PLY STRL (GAUZE/BANDAGES/DRESSINGS) ×2 IMPLANT
GLOVE BIO SURGEON STRL SZ 6 (GLOVE) IMPLANT
GLOVE BIO SURGEON STRL SZ 6.5 (GLOVE) IMPLANT
GLOVE BIO SURGEON STRL SZ7 (GLOVE) IMPLANT
GLOVE BIO SURGEON STRL SZ7.5 (GLOVE) IMPLANT
GLOVE BIOGEL PI IND STRL 6 (GLOVE) IMPLANT
GLOVE BIOGEL PI IND STRL 6.5 (GLOVE) IMPLANT
GLOVE BIOGEL PI IND STRL 7.5 (GLOVE) IMPLANT
GLOVE SS BIOGEL STRL SZ 6 (GLOVE) IMPLANT
GLOVE SURG MICRO LTX SZ7 (GLOVE) ×4 IMPLANT
GOWN STRL REUS W/ TWL LRG LVL3 (GOWN DISPOSABLE) ×8 IMPLANT
GOWN STRL REUS W/ TWL XL LVL3 (GOWN DISPOSABLE) ×2 IMPLANT
GOWN STRL REUS W/TWL LRG LVL3 (GOWN DISPOSABLE) ×6
GOWN STRL REUS W/TWL XL LVL3 (GOWN DISPOSABLE) ×6
HEMOSTAT POWDER SURGIFOAM 1G (HEMOSTASIS) ×6 IMPLANT
HEMOSTAT SURGICEL 2X14 (HEMOSTASIS) ×2 IMPLANT
IV ADAPTER SYR DOUBLE MALE LL (MISCELLANEOUS) IMPLANT
KIT BASIN OR (CUSTOM PROCEDURE TRAY) ×2 IMPLANT
KIT CATH CPB BARTLE (MISCELLANEOUS) ×2 IMPLANT
KIT SUCTION CATH 14FR (SUCTIONS) ×2 IMPLANT
KIT SUT CK MINI COMBO 4X17 (Prosthesis & Implant Heart) IMPLANT
KIT TURNOVER KIT B (KITS) ×2 IMPLANT
LINE VENT (MISCELLANEOUS) IMPLANT
NS IRRIG 1000ML POUR BTL (IV SOLUTION) ×12 IMPLANT
PACK E OPEN HEART (SUTURE) ×2 IMPLANT
PACK OPEN HEART (CUSTOM PROCEDURE TRAY) ×2 IMPLANT
PAD ARMBOARD 7.5X6 YLW CONV (MISCELLANEOUS) ×4 IMPLANT
POSITIONER HEAD DONUT 9IN (MISCELLANEOUS) ×2 IMPLANT
SET MPS 3-ND DEL (MISCELLANEOUS) IMPLANT
SET Y-VENT ADPTR 7.5 MALE LUER (ADAPTER) IMPLANT
SPONGE T-LAP 18X18 ~~LOC~~+RFID (SPONGE) ×8 IMPLANT
SPONGE T-LAP 4X18 ~~LOC~~+RFID (SPONGE) ×2 IMPLANT
STOPCOCK 4 WAY LG BORE MALE ST (IV SETS) IMPLANT
SUT BONE WAX W31G (SUTURE) ×2 IMPLANT
SUT EB EXC GRN/WHT 2-0 V-5 (SUTURE) ×4 IMPLANT
SUT ETHIBON EXCEL 2-0 V-5 (SUTURE) IMPLANT
SUT ETHIBOND 2 0 SH (SUTURE) ×2
SUT ETHIBOND 2 0 SH 36X2 (SUTURE) IMPLANT
SUT ETHIBOND 2-0 30 1/2 V-5 (SUTURE) IMPLANT
SUT ETHIBOND V-5 VALVE (SUTURE) IMPLANT
SUT PROLENE 3 0 SH DA (SUTURE) IMPLANT
SUT PROLENE 3 0 SH1 36 (SUTURE) ×2 IMPLANT
SUT PROLENE 4 0 RB 1 (SUTURE) ×8
SUT PROLENE 4 0 SH DA (SUTURE) IMPLANT
SUT PROLENE 4-0 RB1 .5 CRCL 36 (SUTURE) ×6 IMPLANT
SUT SILK 1 TIES 10X30 (SUTURE) IMPLANT
SUT SILK 2 0 SH CR/8 (SUTURE) IMPLANT
SUT STEEL 6MS V (SUTURE) IMPLANT
SUT STEEL STERNAL CCS#1 18IN (SUTURE) IMPLANT
SUT STEEL SZ 6 DBL 3X14 BALL (SUTURE) IMPLANT
SUT VIC AB 1 CTX 36 (SUTURE) ×6
SUT VIC AB 1 CTX36XBRD ANBCTR (SUTURE) ×4 IMPLANT
SYSTEM SAHARA CHEST DRAIN ATS (WOUND CARE) ×2 IMPLANT
TAPE CLOTH SURG 4X10 WHT LF (GAUZE/BANDAGES/DRESSINGS) IMPLANT
TAPE PAPER 2X10 WHT MICROPORE (GAUZE/BANDAGES/DRESSINGS) IMPLANT
TOWEL GREEN STERILE (TOWEL DISPOSABLE) ×2 IMPLANT
TOWEL GREEN STERILE FF (TOWEL DISPOSABLE) ×2 IMPLANT
TRAY FOLEY SLVR 16FR TEMP STAT (SET/KITS/TRAYS/PACK) ×2 IMPLANT
TUBE CONNECTING 12X1/4 (SUCTIONS) IMPLANT
TUBE CONNECTING 20X1/4 (TUBING) IMPLANT
TUBE SUCT INTRACARD DLP 20F (MISCELLANEOUS) IMPLANT
TUBING ART PRESS 48 MALE/FEM (TUBING) IMPLANT
UNDERPAD 30X36 HEAVY ABSORB (UNDERPADS AND DIAPERS) ×2 IMPLANT
VALVE AORTIC SZ27 INSP/RESIL (Valve) IMPLANT
VENT LEFT HEART 12002 (CATHETERS) ×2
WATER STERILE IRR 1000ML POUR (IV SOLUTION) ×4 IMPLANT

## 2022-11-04 SURGICAL SUPPLY — 67 items
BAG DECANTER FOR FLEXI CONT (MISCELLANEOUS) ×1 IMPLANT
BLADE CLIPPER SURG (BLADE) ×1 IMPLANT
CANISTER SUCT 3000ML PPV (MISCELLANEOUS) ×1 IMPLANT
CATH ROBINSON RED A/P 18FR (CATHETERS) IMPLANT
CLIP TI MEDIUM 24 (CLIP) IMPLANT
CLIP TI WIDE RED SMALL 24 (CLIP) IMPLANT
CONTAINER PROTECT SURGISLUSH (MISCELLANEOUS) ×1 IMPLANT
COVER SURGICAL LIGHT HANDLE (MISCELLANEOUS) ×2 IMPLANT
DRAPE CARDIOVASCULAR INCISE (DRAPES)
DRAPE LAPAROSCOPIC ABDOMINAL (DRAPES) IMPLANT
DRAPE SRG 135X102X78XABS (DRAPES) ×1 IMPLANT
DRAPE WARM FLUID 44X44 (DRAPES) IMPLANT
DRSG COVADERM 4X14 (GAUZE/BANDAGES/DRESSINGS) ×1 IMPLANT
ELECT CAUTERY BLADE 6.4 (BLADE) ×1 IMPLANT
ELECT REM PT RETURN 9FT ADLT (ELECTROSURGICAL) ×2
ELECTRODE REM PT RTRN 9FT ADLT (ELECTROSURGICAL) ×2 IMPLANT
FELT TEFLON 1X6 (MISCELLANEOUS) IMPLANT
GAUZE SPONGE 4X4 12PLY STRL (GAUZE/BANDAGES/DRESSINGS) ×2 IMPLANT
GAUZE SPONGE 4X4 12PLY STRL LF (GAUZE/BANDAGES/DRESSINGS) IMPLANT
GLOVE BIO SURGEON STRL SZ 6 (GLOVE) IMPLANT
GLOVE BIO SURGEON STRL SZ 6.5 (GLOVE) IMPLANT
GLOVE BIO SURGEON STRL SZ7 (GLOVE) IMPLANT
GLOVE BIO SURGEON STRL SZ7.5 (GLOVE) IMPLANT
GLOVE BIOGEL PI IND STRL 7.0 (GLOVE) IMPLANT
GLOVE SURG MICRO LTX SZ7 (GLOVE) ×2 IMPLANT
GOWN STRL REUS W/ TWL LRG LVL3 (GOWN DISPOSABLE) ×4 IMPLANT
GOWN STRL REUS W/ TWL XL LVL3 (GOWN DISPOSABLE) ×1 IMPLANT
GOWN STRL REUS W/TWL LRG LVL3 (GOWN DISPOSABLE) ×4
GOWN STRL REUS W/TWL XL LVL3 (GOWN DISPOSABLE) ×1
HEMOSTAT POWDER SURGIFOAM 1G (HEMOSTASIS) ×3 IMPLANT
HEMOSTAT SURGICEL 2X14 (HEMOSTASIS) ×1 IMPLANT
KIT BASIN OR (CUSTOM PROCEDURE TRAY) ×1 IMPLANT
KIT CATH CPB BARTLE (MISCELLANEOUS) IMPLANT
KIT SUCTION CATH 14FR (SUCTIONS) ×1 IMPLANT
KIT TURNOVER KIT B (KITS) ×1 IMPLANT
NS IRRIG 1000ML POUR BTL (IV SOLUTION) ×5 IMPLANT
PACK CHEST (CUSTOM PROCEDURE TRAY) ×1 IMPLANT
PAD ARMBOARD 7.5X6 YLW CONV (MISCELLANEOUS) ×2 IMPLANT
PENCIL BUTTON HOLSTER BLD 10FT (ELECTRODE) IMPLANT
POSITIONER HEAD DONUT 9IN (MISCELLANEOUS) ×1 IMPLANT
SPONGE T-LAP 18X18 ~~LOC~~+RFID (SPONGE) IMPLANT
SUT ETHIBOND 2 0 SH (SUTURE) ×1
SUT ETHIBOND 2 0 SH 36X2 (SUTURE) IMPLANT
SUT MNCRL AB 4-0 PS2 18 (SUTURE) IMPLANT
SUT PROLENE 3 0 SH1 36 (SUTURE) IMPLANT
SUT PROLENE 4 0 RB 1 (SUTURE) ×1
SUT PROLENE 4-0 RB1 .5 CRCL 36 (SUTURE) IMPLANT
SUT PROLENE 5 0 C 1 36 (SUTURE) IMPLANT
SUT PROLENE 6 0 C 1 30 (SUTURE) IMPLANT
SUT SILK 2 0SH CR/8 30 (SUTURE) IMPLANT
SUT STEEL STERNAL CCS#1 18IN (SUTURE) IMPLANT
SUT STEEL SZ 6 DBL 3X14 BALL (SUTURE) IMPLANT
SUT VIC AB 1 CTX 27 (SUTURE) IMPLANT
SUT VIC AB 1 CTX 36 (SUTURE) ×2
SUT VIC AB 1 CTX36XBRD ANBCTR (SUTURE) ×2 IMPLANT
SUT VIC AB 2-0 CTX 27 (SUTURE) IMPLANT
SUT VIC AB 3-0 SH 27 (SUTURE)
SUT VIC AB 3-0 SH 27X BRD (SUTURE) IMPLANT
SUT VIC AB 3-0 X1 27 (SUTURE) IMPLANT
SYR 3ML LL SCALE MARK (SYRINGE) IMPLANT
SYSTEM SAHARA CHEST DRAIN ATS (WOUND CARE) IMPLANT
TAPE CLOTH SURG 4X10 WHT LF (GAUZE/BANDAGES/DRESSINGS) IMPLANT
TAPE PAPER 3X10 WHT MICROPORE (GAUZE/BANDAGES/DRESSINGS) IMPLANT
TOWEL GREEN STERILE (TOWEL DISPOSABLE) ×1 IMPLANT
TOWEL GREEN STERILE FF (TOWEL DISPOSABLE) ×1 IMPLANT
TUBE CONNECTING 12X1/4 (SUCTIONS) IMPLANT
WATER STERILE IRR 1000ML POUR (IV SOLUTION) ×2 IMPLANT

## 2022-11-04 NOTE — Transfer of Care (Signed)
Immediate Anesthesia Transfer of Care Note  Patient: LADARION CHARON  Procedure(s) Performed: EXPLORATION POST OPERATIVE OPEN HEART (Chest)  Patient Location: SICU  Anesthesia Type:General  Level of Consciousness: Patient remains intubated per anesthesia plan  Airway & Oxygen Therapy: Patient remains intubated per anesthesia plan and Patient placed on Ventilator (see vital sign flow sheet for setting)  Post-op Assessment: Report given to RN and Post -op Vital signs reviewed and stable  Post vital signs: Reviewed and stable  Last Vitals:  Vitals Value Taken Time  BP    Temp    Pulse 87 11/04/22 1834  Resp 16 11/04/22 1834  SpO2 100 % 11/04/22 1834  Vitals shown include unvalidated device data.  Last Pain:  Vitals:   11/04/22 1600  TempSrc: Core  PainSc:          Complications: No notable events documented.

## 2022-11-04 NOTE — Transfer of Care (Signed)
Immediate Anesthesia Transfer of Care Note  Patient: Christopher Arias  Procedure(s) Performed: AORTIC VALVE REPLACEMENT (AVR) USING INSPIRIS RESILIA 27 MM AORTIC VALVE (Chest) TRANSESOPHAGEAL ECHOCARDIOGRAM  Patient Location: ICU  Anesthesia Type:General  Level of Consciousness: sedated and Patient remains intubated per anesthesia plan  Airway & Oxygen Therapy: Patient remains intubated per anesthesia plan and Patient placed on Ventilator (see vital sign flow sheet for setting)  Post-op Assessment: Report given to RN and Post -op Vital signs reviewed and stable  Post vital signs: Reviewed and stable  Last Vitals:  Vitals Value Taken Time  BP 88/62 11/04/22 1219  Temp 36 C 11/04/22 1228  Pulse 89 11/04/22 1228  Resp 17 11/04/22 1228  SpO2 100 % 11/04/22 1228  Vitals shown include unvalidated device data.  Last Pain:  Vitals:   11/04/22 0612  TempSrc:   PainSc: 0-No pain         Complications: No notable events documented.

## 2022-11-04 NOTE — Anesthesia Procedure Notes (Signed)
Central Venous Catheter Insertion Performed by: Gaynelle Adu, MD, anesthesiologist Start/End6/08/2022 6:35 AM, 11/04/2022 6:50 AM Patient location: Pre-op. Preanesthetic checklist: patient identified, IV checked, site marked, risks and benefits discussed, surgical consent, monitors and equipment checked, pre-op evaluation, timeout performed and anesthesia consent Hand hygiene performed  and maximum sterile barriers used  PA cath was placed.Swan type:thermodilution PA Cath depth:50 Procedure performed without using ultrasound guided technique. Attempts: 1 Patient tolerated the procedure well with no immediate complications.

## 2022-11-04 NOTE — Op Note (Signed)
CARDIOVASCULAR SURGERY OPERATIVE NOTE  11/04/2022 [name] [id number]  Surgeon:  Alleen Borne, MD  First Assistant: Aloha Gell,  PA-C: An experienced assistant was required given the complexity of this surgery and the standard of surgical care. The assistant was needed for exposure, dissection, suctioning, retraction of delicate tissues and sutures, instrument exchange and for overall help during this procedure.    Preoperative Diagnosis:  Bicuspid severe aortic valve insufficiency   Postoperative Diagnosis:  Same   Procedure:  Median Sternotomy Extracorporeal circulation 3.   Aortic valve replacement using a 27 mm Edwards INSPIRIS RESILIA pericardial valve.  Anesthesia:  General Endotracheal   Clinical History/Surgical Indication:  This 62 year old gentleman has a bicuspid aortic valve with severe aortic insufficiency as noted on his TEE. The aortic root was mildly dilated at 40 mm on TEE. He has no coronary disease on catheterization. CTA of the chest showed the aortic root to have a diameter of 4.5 cm at the sinus of valsalva level with no effacement of the STJ and normal ascending, arch and descending aorta. I don't think his aorta needs to be replaced. There are aneurysmal changes of the RCA and mid LCx with mild ectasia of the left main and proximal LAD but this can not be changed.  I agree that aortic valve replacement is indicated in this patient for relief of his symptoms as well as to prevent progressive left ventricular deterioration. I reviewed all the studies with the patient and his family and answered their questions. I recommended use of a bioprosthetic valve. I discussed the operative procedure with the patient and family including alternatives, benefits and risks; including but not limited to bleeding, blood transfusion, infection, stroke, myocardial infarction, graft failure, heart block requiring a permanent pacemaker, organ dysfunction, and death. [name]  understands and agrees to proceed.   Preparation:  The patient was seen in the preoperative holding area and the correct patient, correct operation were confirmed with the patient after reviewing the medical record and catheterization. The consent was signed by me. Preoperative antibiotics were given. A pulmonary arterial line and radial arterial line were placed by the anesthesia team. The patient was taken back to the operating room and positioned supine on the operating room table. After being placed under general endotracheal anesthesia by the anesthesia team a foley catheter was placed. The neck, chest, abdomen, and both legs were prepped with betadine soap and solution and draped in the usual sterile manner. A surgical time-out was taken and the correct patient and operative procedure were confirmed with the nursing and anesthesia staff.   Pre-bypass TEE:   Complete TEE assessment was performed by Dr. Marguerita Merles. This showed a bicuspid aortic valve with severe AI, normal LV systolic function.    Post-bypass TEE:   Normal functioning prosthetic aortic valve with no perivalvular leak or regurgitation through the valve. Left ventricular function preserved. No mitral regurgitation.    Cardiopulmonary Bypass:  A median sternotomy was performed. The pericardium was opened in the midline. Right ventricular function appeared normal. The ascending aorta was of normal size and had no palpable plaque. There were no contraindications to aortic cannulation or cross-clamping. The patient was fully systemically heparinized and the ACT was maintained > 400 sec. The proximal aortic arch was cannulated with a 20 F aortic cannula for arterial inflow. Venous cannulation was performed via the right atrial appendage using a two-staged venous cannula. An antegrade cardioplegia/vent cannula was inserted into the mid-ascending aorta. A left ventricular  vent was placed via the right superior pulmonary vein.  A retrograde cardioplegia cannnula was placed into the coronary sinus via the right atrium. Aortic occlusion was performed with a single cross-clamp. Systemic cooling to 32 degrees Centigrade and topical cooling of the heart with iced saline were used. Retrograde cold KBC cardioplegia was used to induce diastolic arrest and then warm retrograde KBC cardioplegia was given prior to removing the cross clamp.  A temperature probe was inserted into the interventricular septum and an insulating pad was placed in the pericardium.   Aortic Valve Replacement:   A transverse aortotomy was performed 1 cm above the take-off of the right coronary artery. The native valve was a type 1 bicuspid with fusion of the left and right cusps with one raphe. The leaflets were thickened and retracted and did not coapt in middle. The ostium of the left coronary artery was in normal position and the ostum of the right coronary was large and several cm above the annulus.  The native valve leaflets were excised and the annulus. Care was taken to remove all particulate debris. The left ventricle was directly inspected for debris and then irrigated with ice saline solution. The annulus was sized and a size 27 mm INSPIRIS RESILIA pericardial valve was chosen. The model number was [id number] and the serial number was [id number]. While the valve was being prepared 2-0 Ethibond pledgeted horizontal mattress sutures were placed around the annulus with the pledgets in a sub-annular position. The sutures were placed through the sewing ring and the valve lowered into place. The sutures were tied using CorKnots. The valve seated nicely and the coronary ostia were not obstructed. The prosthetic valve leaflets moved normally and there was no sub-valvular obstruction. The aortotomy was closed using 4-0 Prolene suture in 2 layers with felt strips to reinforce the closure.  Completion:  The patient was rewarmed to 37 degrees Centigrade. De-airing maneuvers  were performed and the head placed in trendelenburg position. The crossclamp was removed with a time of 84 minutes. There was spontaneous return of sinus rhythm. The aortotomy was checked for hemostasis. Two temporary epicardial pacing wires were placed on the right atrium and two on the right ventricle. The left ventricular vent and retrograde cardioplegia cannulas were removed. The patient was weaned from CPB without difficulty on no inotropes. CPB time was 114 minutes. Cardiac output was 5 LPM. Heparin was fully reversed with protamine and the aortic and venous cannulas removed. Hemostasis was achieved. Mediastinal drainage tubes were placed. The sternum was closed with double #6 stainless steel wires. The fascia was closed with continuous # 1 vicryl suture. The subcutaneous tissue was closed with 2-0 vicryl continuous suture. The skin was closed with 3-0 vicryl subcuticular suture. All sponge, needle, and instrument counts were reported correct at the end of the case. Dry sterile dressings were placed over the incisions and around the chest tubes which were connected to pleurevac suction. The patient was then transported to the surgical intensive care unit in stable condition.

## 2022-11-04 NOTE — Anesthesia Procedure Notes (Signed)
Arterial Line Insertion Performed by: Samara Deist, CRNA, CRNA  Patient location: Pre-op. Preanesthetic checklist: patient identified, IV checked, site marked, risks and benefits discussed, surgical consent, monitors and equipment checked, pre-op evaluation, timeout performed and anesthesia consent Lidocaine 1% used for infiltration and patient sedated Left, radial was placed Catheter size: 20 G Hand hygiene performed  and maximum sterile barriers used  Allen's test indicative of satisfactory collateral circulation Attempts: 1 Procedure performed without using ultrasound guided technique. Following insertion, dressing applied and Biopatch. Post procedure assessment: normal  Patient tolerated the procedure well with no immediate complications.

## 2022-11-04 NOTE — Interval H&P Note (Signed)
History and Physical Interval Note:  11/04/2022 6:45 AM  Christopher Arias  has presented today for surgery, with the diagnosis of SEVERE AI.  The various methods of treatment have been discussed with the patient and family. After consideration of risks, benefits and other options for treatment, the patient has consented to  Procedure(s): AORTIC VALVE REPLACEMENT (AVR) (N/A) TRANSESOPHAGEAL ECHOCARDIOGRAM (N/A) as a surgical intervention.  The patient's history has been reviewed, patient examined, no change in status, stable for surgery.  I have reviewed the patient's chart and labs.  Questions were answered to the patient's satisfaction.     Alleen Borne

## 2022-11-04 NOTE — Discharge Instructions (Addendum)
Discharge Instructions:  1. You may shower, please wash incisions daily with soap and water and keep dry.  If you wish to cover wounds with dressing you may do so but please keep clean and change daily.  No tub baths or swimming until incisions have completely healed.  If your incisions become red or develop any drainage please call our office at [phone]  2. No Driving until cleared by Dr. Sharee Pimple office and you are no longer using narcotic pain medications  3. Monitor your weight daily.. Please use the same scale and weigh at same time... If you gain 5-10 lbs in 48 hours with associated lower extremity swelling, please contact our office at [phone]  4. Fever of 101.5 for at least 24 hours with no source, please contact our office at [phone]  5. Activity- up as tolerated, please walk at least 3 times per day.  Avoid strenuous activity, no lifting, pushing, or pulling with your arms over 8-10 lbs for a minimum of 6 weeks  6. If any questions or concerns arise, please do not hesitate to contact our office at [phone]       Supplemental Discharge Instructions for  Pacemaker/Defibrillator Patients   Activity No heavy lifting or vigorous activity with your left/right arm for 6 to 8 weeks.  Do not raise your left/right arm above your head for one week.  Gradually raise your affected arm as drawn below.             11/17/22                     11/18/22                    11/19/22                11/20/22 __ No driving until 06/04/70 (and cleared to by Dr. Laneta Simmers as well  WOUND CARE Keep the wound area clean and dry.  Do not get this area wet , no showers UNTIL CLEARED TO AT [facility] VISIT The tape/steri-strips on your wound will fall off; do not pull them off.  No bandage is needed on the site.  DO  NOT apply any creams, oils, or ointments to the wound area. If you notice any drainage or discharge from the wound, any swelling or bruising at the site, or you develop a  fever > 101? F after you are discharged home, call the office at once.  Special Instructions You are still able to use cellular telephones; use the ear opposite the side where you have your pacemaker/defibrillator.  Avoid carrying your cellular phone near your device. When traveling through airports, show security personnel your identification card to avoid being screened in the metal detectors.  Ask the security personnel to use the hand wand. Avoid arc welding equipment, MRI testing (magnetic resonance imaging), TENS units (transcutaneous nerve stimulators).  Call the office for questions about other devices. Avoid electrical appliances that are in poor condition or are not properly grounded. Microwave ovens are safe to be near or to operate.

## 2022-11-04 NOTE — Anesthesia Procedure Notes (Signed)
Central Venous Catheter Insertion Performed by: Gaynelle Adu, MD, anesthesiologist Start/End6/08/2022 6:35 AM, 11/04/2022 6:50 AM Patient location: Pre-op. Preanesthetic checklist: patient identified, IV checked, site marked, risks and benefits discussed, surgical consent, monitors and equipment checked, pre-op evaluation, timeout performed and anesthesia consent Position: Trendelenburg Lidocaine 1% used for infiltration and patient sedated Hand hygiene performed , maximum sterile barriers used  and Seldinger technique used Catheter size: 9 Fr Total catheter length 10. Central line was placed.MAC introducer Procedure performed using ultrasound guided technique. Ultrasound Notes:anatomy identified, needle tip was noted to be adjacent to the nerve/plexus identified, no ultrasound evidence of intravascular and/or intraneural injection and image(s) printed for medical record Attempts: 1 Following insertion, line sutured, dressing applied and Biopatch. Post procedure assessment: blood return through all ports, free fluid flow and no air  Patient tolerated the procedure well with no immediate complications.

## 2022-11-04 NOTE — Op Note (Signed)
11/04/2022 Christopher Arias [id number]  Surgeon: Alleen Borne, MD   First Assistant: RNFA  Preoperative Diagnosis: mediastinal bleeding s/p AVR  Postoperative Diagnosis: Same   Procedure:  1.  Median Sternotomy 2. Evacuation of mediastinal hematoma 3.  Suture repair of bleeding from sternal wire.   Anesthesia: General Endotracheal   Clinical History/Surgical Indication:   The patient underwent AVR earlier today and had somewhat brittle sternal bone and appeared coagulopathic. Hemostasis appeared adequate but after the sternum was closed there was a continued slow ooze from the chest tube beneath the sternum. His INR was 1.6 and he was given 2 units of FFP. Platelet count was adequate. He continued to have 200-300 cc/hr for a couple hours and it was not slowly so I decided to take him back to the OR. He remained hemodynamically stable. I discussed the situation and planned exploration with his wife and she agreed to proceed.  Preparation:   The patient was taken directly to the operating room.  The patient was positioned supine on the operating room table. After being placed under general endotracheal anesthesia by the anesthesia team the neck, chest, and abdomen were prepped with betadine soap and solution and draped in the usual sterile manner. A surgical time-out was taken and the correct patient and operative procedure were confirmed with the nursing and anesthesia staff.   Median Sternotomy:   The previous median sternotomy was opened. Upon separating the sternum there was some clot and fresh blood. There was some  Bleeding from the manubrial wire sites and I think that must have been the source of the bleeding. The cannulation sites and aortotomy were hemostatic. Pacing wire sites were dry. The mediastinum was irrigated with saline. There was complete hemostasis.   Completion:   Mediastinal drainage tubes were declotted and replaced. The sternum was closed with double #6 stainless  steel wires. There was some oozing from the manubrial wires and this was stopped with 0-Vicryl figure of 8 sutures around the bone. The fascia was closed with continuous # 1 vicryl suture. The subcutaneous tissue was closed with 2-0 vicryl continuous suture. The skin was closed with 3-0 vicryl subcuticular suture. All sponge, needle, and instrument counts were reported correct at the end of the case. Dry sterile dressings were placed over the incisions and around the chest tubes which were connected to pleurevac suction. The patient was then transported to the surgical intensive care unit in satisfactory and stable condition.

## 2022-11-04 NOTE — Anesthesia Preprocedure Evaluation (Signed)
Anesthesia Evaluation  Patient identified by MRN, date of birth, ID band Patient awake    Reviewed: Allergy & Precautions, H&P , NPO status , Patient's Chart, lab work & pertinent test resultsPreop documentation limited or incomplete due to emergent nature of procedure.  History of Anesthesia Complications (+) PONV and history of anesthetic complications  Airway Mallampati: Intubated  TM Distance: >3 FB Neck ROM: Full    Dental no notable dental hx. (+) Teeth Intact, Dental Advisory Given   Pulmonary asthma , sleep apnea , COPD,  COPD inhaler, former smoker      + intubated    Cardiovascular Exercise Tolerance: Good + Valvular Problems/Murmurs  Rhythm:Regular Rate:Normal     Neuro/Psych  PSYCHIATRIC DISORDERS Anxiety Depression    negative neurological ROS     GI/Hepatic Neg liver ROS,GERD  ,,  Endo/Other  Hypothyroidism    Renal/GU negative Renal ROS  negative genitourinary   Musculoskeletal   Abdominal   Peds  Hematology negative hematology ROS (+)   Anesthesia Other Findings   Reproductive/Obstetrics negative OB ROS                             Anesthesia Physical Anesthesia Plan  ASA: 4 and emergent  Anesthesia Plan: General   Post-op Pain Management: Tylenol PO (pre-op)*   Induction: Intravenous  PONV Risk Score and Plan: 3 and Ondansetron, Dexamethasone and Midazolam  Airway Management Planned: Oral ETT  Additional Equipment: Arterial line, CVP, PA Cath, TEE and Ultrasound Guidance Line Placement  Intra-op Plan:   Post-operative Plan: Post-operative intubation/ventilation  Informed Consent: I have reviewed the patients History and Physical, chart, labs and discussed the procedure including the risks, benefits and alternatives for the proposed anesthesia with the patient or authorized representative who has indicated his/her understanding and acceptance.     Dental  advisory given  Plan Discussed with: CRNA  Anesthesia Plan Comments: (See PAT note from 5/30 by Sherlie Ban PA-C )        Anesthesia Quick Evaluation

## 2022-11-04 NOTE — Hospital Course (Addendum)
HPI: The patient is a 62 year old gentleman with a history of hyperlipidemia, hypothyroidism on replacement therapy, previous smoking with COPD recently on home oxygen at 3 L, mild OSA not on CPAP, and bicuspid aortic valve disease with severe regurgitation who was referred for consideration of valve replacement.  He reports a several month history of shortness of breath and was hospitalized in December 2023 with acute hypoxic respiratory failure in the setting of RSV infection.  He recovered from this but has continued to have exertional fatigue and shortness of breath.  2D echo on 07/11/2022 was felt to show mild to moderate aortic insufficiency with a mean gradient of 4 mmHg.  There was a question of a small density on the aortic valve but visualization was suboptimal.  The aorta appeared to be of normal size with a aortic root diameter of 2.95 cm.  Left ventricular ejection fraction was 60 to 65%.  He subsequently had a TEE performed on 08/01/2022 which showed a type I bicuspid aortic valve with severe eccentric aortic insufficiency with a PISA radius of 1 cm, ERO of 0.52 cm, regurgitant volume 67 cc, and holodiastolic reversal in the ascending aorta.  The aortic root diameter was 40 mm.  Cardiac catheterization was performed on 08/20/2022 and showed large ectatic coronary arteries with no significant stenosis.  PA pressure is mildly elevated with normal LVEDP.   CTA of the chest showed the aortic root to have a diameter of 4.5 cm at the sinus of valsalva level with no effacement of the STJ and normal ascending, arch and descending aorta. I don't think his aorta needs to be replaced. There are aneurysmal changes of the RCA and mid LCx with mild ectasia of the left main and proximal LAD but this can not be changed.   The patient lives with his wife.  He said that he feels fairly well overall but still has some exertional shortness of breath and fatigue.  He has been able to walk his dog without oxygen.  He  denies any peripheral edema.  He denies chest pain or pressure.  He denies any dizziness or syncope.  Dr. Laneta Simmers reviewed the patient's diagnostic studies and determined surgical intervention would benefit this patient. He reviewed the treatment options as well as the risks and benefits with the patient. Mr. Oddo was agreeable to proceed with surgery.  Hospital Course: Mr. Currey presented to [facility] and was brought to the operating room on 11/04/22. He underwent Aortic Valve Replacement utilizing a 27mm Inspiris Resilia Aortic Valve. He tolerated the procedure well and was transferred to the SICU in stable condition. He experienced mediastinal bleeding and was brought back to the operating room on 06/03, he underwent evacuation of mediastinal hematoma and suture repair of bleeding from a sternal wire. He was returned to the SICU in stable condition. He was extubated the night of surgery without complication. High dose neo was transitioned to Norepi and Midodrine for blood pressure support. Theone Murdoch catheter was removed without complication. He had acute expected postoperative blood loss anemia and was transfused with 1 unit of PRBCs. Insulin drip was discontinued and SSI was started. Norepi was weaned as hemodynamics tolerated. He was in complete heart block with a junctional rhythm, he was DDD paced at 80. Chest tubes were removed without complication. CBGs were controlled, SSI was discontinued. He was volume overload and diuresed accordingly. He converted to a 2nd degree type 1 heart block, he was VVI paced at 50 and EP was consulted  for possible pacer. EP recommended avoiding AV nodal blocking agents and would continue to monitor his rhythm. Empiric Maxipime was started due to right lung airspace disease. This was completed after 5 days and there was no sign of infection. Pulmonary hygiene with nebulizers, IS and ambulation was encouraged. He developed urinary retention, Flomax was started and  foley catheter was placed. He continued to require 45L/min of HFNC, this was weaned as tolerated. He remained on 10mg  TID of Midodrine for blood pressure support. He was felt stable for transfer to the progressive unit. He has had some atrial arrhythmia with intact conduction, but has also had pacing throughout the day yesterday and this am. Will continue to monitor throughout the weekend;made NPO Monday after midnight in anticipation of needing PPM. Oxygen was weaned as tolerated and he was saturating well on his baseline 3L [place]. Thrombocytopenia improved. His last platelet count was 112,000. Voiding trial was done on 06/09 and he began voiding without difficulty. PPM was placed by the electrophysiology team on 06/11. He tolerated the procedure well and was transferred back to 4E. PT/OT evaluations were ordered and recommended ***. His incisions were healing well without sign of infection.

## 2022-11-04 NOTE — Anesthesia Postprocedure Evaluation (Signed)
Anesthesia Post Note  Patient: Christopher Arias  Procedure(s) Performed: AORTIC VALVE REPLACEMENT (AVR) USING INSPIRIS RESILIA 27 MM AORTIC VALVE (Chest) TRANSESOPHAGEAL ECHOCARDIOGRAM     Patient location during evaluation: SICU Anesthesia Type: General Level of consciousness: sedated Pain management: pain level controlled Vital Signs Assessment: post-procedure vital signs reviewed and stable Respiratory status: patient remains intubated per anesthesia plan Cardiovascular status: stable Postop Assessment: no apparent nausea or vomiting Anesthetic complications: no  No notable events documented.  Last Vitals:  Vitals:   11/04/22 1345 11/04/22 1415  BP:  (!) 91/57  Pulse: 89 89  Resp: 14 17  Temp: (!) 36 C (!) 36 C  SpO2: 100% 100%    Last Pain:  Vitals:   11/04/22 1345  TempSrc: Core  PainSc:                  Etai Copado,W. EDMOND

## 2022-11-04 NOTE — Progress Notes (Signed)
Pt tol rapid wean per protocol well. RR 22 VC 5.8L NIF >-50 Leak around cuff audible. ABG Ph 7.32 CO2 43 PO2 77 HCO3 22.4 MD notified to verify extubation. Due to OR course.

## 2022-11-04 NOTE — Anesthesia Procedure Notes (Signed)
Procedure Name: Intubation Date/Time: 11/04/2022 7:46 AM  Performed by: Samara Deist, CRNAPre-anesthesia Checklist: Patient identified, Emergency Drugs available, Suction available and Patient being monitored Patient Re-evaluated:Patient Re-evaluated prior to induction Oxygen Delivery Method: Circle System Utilized Preoxygenation: Pre-oxygenation with 100% oxygen Induction Type: IV induction Ventilation: Mask ventilation without difficulty and Oral airway inserted - appropriate to patient size Laryngoscope Size: Mac and 4 Grade View: Grade I Tube type: Oral Tube size: 8.0 mm Number of attempts: 1 Airway Equipment and Method: Stylet and Oral airway Placement Confirmation: ETT inserted through vocal cords under direct vision, positive ETCO2 and breath sounds checked- equal and bilateral Secured at: 22 cm Tube secured with: Tape Dental Injury: Teeth and Oropharynx as per pre-operative assessment

## 2022-11-04 NOTE — Brief Op Note (Signed)
11/04/2022  2:36 PM  PATIENT:  Christopher Arias  62 y.o. male  PRE-OPERATIVE DIAGNOSIS:  SEVERE AORTIC INSUFFICIENCY  POST-OPERATIVE DIAGNOSIS:  SEVERE AORTIC INSUFFICIENCY  PROCEDURE:  AORTIC VALVE REPLACEMENT (AVR) USING INSPIRIS RESILIA 27 MM AORTIC VALVE - Median sternotomy TRANSESOPHAGEAL ECHOCARDIOGRAM   SURGEON:  Surgeon(s) and Role:    * Alleen Borne, MD - Primary  PHYSICIAN ASSISTANT: Aloha Gell PA-C  ASSISTANTS: Virgilio Frees RNFA   ANESTHESIA:   general  EBL:  1121 mL   BLOOD ADMINISTERED:none  DRAINS:  Mediastinal tubes    LOCAL MEDICATIONS USED:  NONE  SPECIMEN:  Source of Specimen:  Aortic valve leaflets  DISPOSITION OF SPECIMEN:  PATHOLOGY  COUNTS CORRECT:  YES  DICTATION: .Dragon Dictation  PLAN OF CARE: Admit to inpatient   PATIENT DISPOSITION:  ICU - intubated and hemodynamically stable.   Delay start of Pharmacological VTE agent (>24hrs) due to surgical blood loss or risk of bleeding: yes

## 2022-11-05 ENCOUNTER — Encounter (HOSPITAL_COMMUNITY): Payer: Self-pay | Admitting: Surgery

## 2022-11-05 ENCOUNTER — Inpatient Hospital Stay (HOSPITAL_COMMUNITY): Payer: BC Managed Care – PPO

## 2022-11-05 LAB — BPAM FFP
Blood Product Expiration Date: 202406082359
ISSUE DATE / TIME: 202406031409
ISSUE DATE / TIME: 202406031507
Unit Type and Rh: 600
Unit Type and Rh: 6200

## 2022-11-05 LAB — CBC
HCT: 23.5 % — ABNORMAL LOW (ref 39.0–52.0)
HCT: 26.6 % — ABNORMAL LOW (ref 39.0–52.0)
Hemoglobin: 7.8 g/dL — ABNORMAL LOW (ref 13.0–17.0)
Hemoglobin: 9 g/dL — ABNORMAL LOW (ref 13.0–17.0)
MCH: 29.4 pg (ref 26.0–34.0)
MCH: 29.7 pg (ref 26.0–34.0)
MCHC: 33.2 g/dL (ref 30.0–36.0)
MCHC: 33.8 g/dL (ref 30.0–36.0)
MCV: 88.7 fL (ref 80.0–100.0)
MCV: 87.8 fL (ref 80.0–100.0)
Platelets: 101 10*3/uL — ABNORMAL LOW (ref 150–400)
Platelets: 77 10*3/uL — ABNORMAL LOW (ref 150–400)
RBC: 2.65 MIL/uL — ABNORMAL LOW (ref 4.22–5.81)
RBC: 3.03 MIL/uL — ABNORMAL LOW (ref 4.22–5.81)
RDW: 13.8 % (ref 11.5–15.5)
RDW: 14.1 % (ref 11.5–15.5)
WBC: 8.3 10*3/uL (ref 4.0–10.5)
WBC: 9.3 10*3/uL (ref 4.0–10.5)
nRBC: 0 % (ref 0.0–0.2)
nRBC: 0 % (ref 0.0–0.2)

## 2022-11-05 LAB — GLUCOSE, CAPILLARY
Glucose-Capillary: 104 mg/dL — ABNORMAL HIGH (ref 70–99)
Glucose-Capillary: 106 mg/dL — ABNORMAL HIGH (ref 70–99)
Glucose-Capillary: 109 mg/dL — ABNORMAL HIGH (ref 70–99)
Glucose-Capillary: 110 mg/dL — ABNORMAL HIGH (ref 70–99)
Glucose-Capillary: 114 mg/dL — ABNORMAL HIGH (ref 70–99)
Glucose-Capillary: 116 mg/dL — ABNORMAL HIGH (ref 70–99)
Glucose-Capillary: 117 mg/dL — ABNORMAL HIGH (ref 70–99)
Glucose-Capillary: 138 mg/dL — ABNORMAL HIGH (ref 70–99)
Glucose-Capillary: 175 mg/dL — ABNORMAL HIGH (ref 70–99)
Glucose-Capillary: 98 mg/dL (ref 70–99)
Glucose-Capillary: 99 mg/dL (ref 70–99)

## 2022-11-05 LAB — TYPE AND SCREEN: ABO/RH(D): A POS

## 2022-11-05 LAB — BASIC METABOLIC PANEL
Anion gap: 7 (ref 5–15)
Anion gap: 10 (ref 5–15)
BUN: 10 mg/dL (ref 8–23)
BUN: 15 mg/dL (ref 8–23)
CO2: 21 mmol/L — ABNORMAL LOW (ref 22–32)
CO2: 21 mmol/L — ABNORMAL LOW (ref 22–32)
Calcium: 7 mg/dL — ABNORMAL LOW (ref 8.9–10.3)
Calcium: 7.4 mg/dL — ABNORMAL LOW (ref 8.9–10.3)
Chloride: 107 mmol/L (ref 98–111)
Chloride: 101 mmol/L (ref 98–111)
Creatinine, Ser: 0.82 mg/dL (ref 0.61–1.24)
Creatinine, Ser: 1.12 mg/dL (ref 0.61–1.24)
GFR, Estimated: >60 mL/min (ref >60)
GFR, Estimated: >60 mL/min (ref >60)
Glucose, Bld: 100 mg/dL — ABNORMAL HIGH (ref 70–99)
Glucose, Bld: 144 mg/dL — ABNORMAL HIGH (ref 70–99)
Potassium: 4 mmol/L (ref 3.5–5.1)
Potassium: 4 mmol/L (ref 3.5–5.1)
Sodium: 135 mmol/L (ref 135–145)
Sodium: 132 mmol/L — ABNORMAL LOW (ref 135–145)

## 2022-11-05 LAB — PREPARE FRESH FROZEN PLASMA: Unit division: 0

## 2022-11-05 LAB — BPAM RBC
Blood Product Expiration Date: 202406272359
ISSUE DATE / TIME: 202406031624
ISSUE DATE / TIME: 202406031624
Unit Type and Rh: 6200

## 2022-11-05 LAB — SURGICAL PATHOLOGY

## 2022-11-05 LAB — MAGNESIUM
Magnesium: 2.2 mg/dL (ref 1.7–2.4)
Magnesium: 2 mg/dL (ref 1.7–2.4)

## 2022-11-05 LAB — PREPARE RBC (CROSSMATCH)

## 2022-11-05 MED ORDER — NOREPINEPHRINE 4 MG/250ML-% IV SOLN
0.0000 ug/min | INTRAVENOUS | Status: DC
  Administered 2022-11-05: 2 ug/min via INTRAVENOUS

## 2022-11-05 MED ORDER — ORAL CARE MOUTH RINSE
15.0000 mL | Freq: Four times a day (QID) | OROMUCOSAL | Status: DC
  Administered 2022-11-05 – 2022-11-06 (×5): 15 mL via OROMUCOSAL

## 2022-11-05 MED ORDER — ASPIRIN 81 MG PO TBEC
81.0000 mg | DELAYED_RELEASE_TABLET | Freq: Every day | ORAL | Status: DC
  Administered 2022-11-06 – 2022-11-14 (×9): 81 mg via ORAL
  Filled 2022-11-05 (×9): qty 1

## 2022-11-05 MED ORDER — KETOROLAC TROMETHAMINE 15 MG/ML IJ SOLN
15.0000 mg | Freq: Four times a day (QID) | INTRAMUSCULAR | Status: AC | PRN
  Administered 2022-11-05 – 2022-11-10 (×7): 15 mg via INTRAVENOUS
  Filled 2022-11-05 (×7): qty 1

## 2022-11-05 MED ORDER — FUROSEMIDE 10 MG/ML IJ SOLN
40.0000 mg | Freq: Once | INTRAMUSCULAR | Status: AC
  Administered 2022-11-05: 40 mg via INTRAVENOUS
  Filled 2022-11-05: qty 4

## 2022-11-05 MED ORDER — MIDODRINE HCL 5 MG PO TABS
10.0000 mg | ORAL_TABLET | Freq: Three times a day (TID) | ORAL | Status: DC
  Administered 2022-11-05 – 2022-11-11 (×20): 10 mg via ORAL
  Filled 2022-11-05 (×20): qty 2

## 2022-11-05 MED ORDER — INSULIN ASPART 100 UNIT/ML IJ SOLN
0.0000 [IU] | INTRAMUSCULAR | Status: DC
  Administered 2022-11-05: 2 [IU] via SUBCUTANEOUS
  Administered 2022-11-05: 4 [IU] via SUBCUTANEOUS

## 2022-11-05 MED ORDER — SODIUM CHLORIDE 0.9% IV SOLUTION: Freq: Once | INTRAVENOUS | Status: AC

## 2022-11-05 MED ORDER — PHENYLEPHRINE CONCENTRATED 100MG/250ML (0.4 MG/ML) INFUSION SIMPLE
0.0000 ug/min | INTRAVENOUS | Status: DC
  Filled 2022-11-05: qty 250

## 2022-11-05 MED ORDER — NOREPINEPHRINE 4 MG/250ML-% IV SOLN
0.0000 ug/min | INTRAVENOUS | Status: DC
  Administered 2022-11-05: 5 ug/min via INTRAVENOUS
  Filled 2022-11-05: qty 250

## 2022-11-05 MED ORDER — FE FUM-VIT C-VIT B12-FA 460-60-0.01-1 MG PO CAPS
1.0000 | ORAL_CAPSULE | Freq: Every day | ORAL | Status: DC
  Administered 2022-11-05 – 2022-11-14 (×9): 1 via ORAL
  Filled 2022-11-05 (×10): qty 1

## 2022-11-05 MED FILL — Thrombin For Soln Kit 20000 Unit: CUTANEOUS | Qty: 1 | Status: AC

## 2022-11-05 MED FILL — Thrombin (Recombinant) For Soln 20000 Unit: CUTANEOUS | Qty: 2 | Status: AC

## 2022-11-05 NOTE — Anesthesia Postprocedure Evaluation (Signed)
Anesthesia Post Note  Patient: Christopher Arias  Procedure(s) Performed: EXPLORATION POST OPERATIVE OPEN HEART (Chest)     Patient location during evaluation: PACU Anesthesia Type: General Level of consciousness: sedated and patient remains intubated per anesthesia plan Pain management: pain level controlled Vital Signs Assessment: post-procedure vital signs reviewed and stable Respiratory status: patient remains intubated per anesthesia plan Cardiovascular status: stable Anesthetic complications: no   No notable events documented.  Last Vitals:  Vitals:   11/05/22 0700 11/05/22 0715  BP: 106/70   Pulse: (!) 111 (!) 109  Resp: 20 (!) 22  Temp: (!) 38.3 C (!) 38.1 C  SpO2: 92% 90%    Last Pain:  Vitals:   11/05/22 0614  TempSrc:   PainSc: 4                  Lewie Loron

## 2022-11-05 NOTE — Progress Notes (Signed)
     [address]       [name] [address]             [phone]       EVENING ROUNDS  POD#1 SP AVR with reexploration Hemodynamics stable on intotropic support Pt comfortable

## 2022-11-05 NOTE — Progress Notes (Signed)
1 Day Post-Op Procedure(s) (LRB): EXPLORATION POST OPERATIVE OPEN HEART (N/A) Subjective: No complaints. Feels well. Wants to get up out of bed  Objective: Vital signs in last 24 hours: Temp:  [95.5 F (35.3 C)-101.3 F (38.5 C)] 100.8 F (38.2 C) (06/04 0645) Pulse Rate:  [72-131] 112 (06/04 0645) Cardiac Rhythm: Normal sinus rhythm;Sinus tachycardia (06/04 0400) Resp:  [12-34] 21 (06/04 0645) BP: (87-138)/(54-88) 94/71 (06/04 0600) SpO2:  [92 %-100 %] 93 % (06/04 0645) Arterial Line BP: (79-269)/(45-148) 110/58 (06/04 0645) FiO2 (%):  [40 %-100 %] 50 % (06/03 1845) Weight:  [83.8 kg] 83.8 kg (06/04 0500)  Hemodynamic parameters for last 24 hours: PAP: (21-58)/(2-27) 37/27 CVP:  [4 mmHg-22 mmHg] 10 mmHg CO:  [2 L/min-6.5 L/min] 5.9 L/min CI:  [1.1 L/min/m2-3.5 L/min/m2] 3.2 L/min/m2  Intake/Output from previous day: 06/03 0701 - 06/04 0700 In: 10304.7 [I.V.:6411.4; Blood:1421; IV Piggyback:2472.3] Out: 5446 [Urine:2525; Blood:1121; Chest Tube:1800] Intake/Output this shift: Total I/O In: 2325.6 [I.V.:2089.5; IV Piggyback:236.1] Out: 1710 [Urine:1100; Chest Tube:610]  General appearance: alert and cooperative Neurologic: intact Heart: regular rate and rhythm, S1, S2 normal, no murmur, click, rub or gallop Lungs: clear to auscultation bilaterally Extremities: edema mild Wound: dressing dry Chest tube output serosanguinous. No air leak.  Lab Results: Recent Labs    11/04/22 1815 11/04/22 1839 11/04/22 2300 11/05/22 0419  WBC 6.7  --   --  8.3  HGB 8.7*   < > 7.8* 7.8*  HCT 26.3*   < > 23.0* 23.5*  PLT 95*  --   --  101*   < > = values in this interval not displayed.   BMET:  Recent Labs    11/04/22 1815 11/04/22 1839 11/04/22 2300 11/05/22 0419  NA 137   < > 140 135  K 4.3   < > 4.2 4.0  CL 108  --   --  107  CO2 22  --   --  21*  GLUCOSE 91  --   --  100*  BUN 14  --   --  10  CREATININE 0.76  --   --  0.82  CALCIUM 6.7*  --   --  7.0*   < > =  values in this interval not displayed.    PT/INR:  Recent Labs    11/04/22 1815  LABPROT 19.1*  INR 1.6*   ABG    Component Value Date/Time   PHART 7.324 (L) 11/04/2022 2300   HCO3 22.1 11/04/2022 2300   TCO2 23 11/04/2022 2300   ACIDBASEDEF 4.0 (H) 11/04/2022 2300   O2SAT 97 11/04/2022 2300   CBG (last 3)  Recent Labs    11/05/22 0419 11/05/22 0506 11/05/22 0625  GLUCAP 106* 99 117*   CXR: bibasilar atelectasis  ECG: sinus tachy, new LBBB postop.  Assessment/Plan:  POD 1 Remains on high dose neo 220 mcg to support BP. May be due to preop Klonopin and Zoloft. He is also anemic. Will switch to NE which may be more effective and add midodrine.   Acute postop blood loss anemia: transfuse 1 unit PRBC's this am.  Volume excess: Wt is 13 lbs over preop. Will start diuresis after transfusion.  Chest tube output still 50/hr so keep in for now.   Hold aspirin today and resume tomorrow at 81 mg. He was having nose bleeds preop and may be a super responder.  DC swan  OOB as tolerated.  Hold beta blocker with vasopressor use and new LBBB.   LOS: 1  day    Alleen Borne 11/05/2022

## 2022-11-05 NOTE — Progress Notes (Signed)
Extubation Procedure Note  Patient Details:   Name: Christopher Arias DOB: 04/17/1961 MRN: [id number]   Airway Documentation:  Airway 8 mm (Active)  Secured at (cm) 22 cm 11/04/22 1830  Measured From Lips 11/04/22 1830  Secured Location Right 11/04/22 1830  Secured By Pink Tape 11/04/22 1830  Prone position No 11/04/22 1830  Cuff Pressure (cm H2O) MOV (Manual Technique) 11/04/22 1220  Site Condition Dry 11/04/22 1830   Vent end date: (not recorded) Vent end time: (not recorded)   Evaluation  O2 sats: stable throughout Complications: No apparent complications Patient did tolerate procedure well. Bilateral Breath Sounds: Clear, Diminished   Yes  Rutha Bouchard 11/05/2022, 6:19 AM

## 2022-11-05 NOTE — Discharge Summary (Signed)
[address]       [place] [address]             [phone]    Physician Discharge Summary  Patient ID: Christopher Arias MRN: [id number] DOB/AGE: 01-12-1961 62 y.o.  Admit date: 11/04/2022 Discharge date: 11/14/2022  Admission Diagnoses:  Patient Active Problem List   Diagnosis Date Noted   Severe aortic regurgitation by prior echocardiography 08/12/2022   Pneumonia due to respiratory syncytial virus (RSV) 05/21/2022   Acute respiratory failure with hypoxia (HCC) 05/10/2022   Acute bronchitis due to respiratory syncytial virus (RSV) 05/10/2022   Rectus sheath hematoma, initial encounter 05/10/2022   Moderate obstructive sleep apnea 11/26/2021   History of tobacco use 11/26/2021   COPD (chronic obstructive pulmonary disease) (HCC) 05/31/2021   Fatigue 05/30/2021   Memory change 05/30/2021   Abnormal breathing 05/30/2021   Rash 11/27/2020   COVID-19 virus infection 06/20/2020   Infected sebaceous cyst 11/19/2019   Vitamin D deficiency 09/08/2019   Mild persistent asthma with exacerbation 09/08/2019   Hyperglycemia 03/08/2019   Allergic rhinitis 03/08/2019   Grade IV hemorrhoids 04/08/2018   Hemorrhoids 03/24/2018   External hemorrhoid, thrombosed 01/14/2018   Insomnia 08/28/2016   Cough 06/30/2015   Inappropriate sinus tachycardia 02/03/2015   Smoker 03/12/2012   Hidradenitis 12/19/2010   Encounter for well adult exam with abnormal findings 11/13/2010   Special screening for malignant neoplasm of prostate 08/23/2010   Hypothyroidism 03/16/2010   ANTERIOR PITUITARY HYPERFUNCTION 11/23/2009   GYNECOMASTIA 11/10/2009   Hyperlipidemia 11/24/2007   Anxiety and depression 11/24/2007   GERD 11/24/2007     Discharge Diagnoses:  Patient Active Problem List   Diagnosis Date Noted   S/P AVR (aortic valve replacement) 11/04/2022   Severe aortic regurgitation by prior echocardiography 08/12/2022   Pneumonia due to respiratory syncytial virus (RSV)  05/21/2022   Acute respiratory failure with hypoxia (HCC) 05/10/2022   Acute bronchitis due to respiratory syncytial virus (RSV) 05/10/2022   Rectus sheath hematoma, initial encounter 05/10/2022   Moderate obstructive sleep apnea 11/26/2021   History of tobacco use 11/26/2021   COPD (chronic obstructive pulmonary disease) (HCC) 05/31/2021   Fatigue 05/30/2021   Memory change 05/30/2021   Abnormal breathing 05/30/2021   Rash 11/27/2020   COVID-19 virus infection 06/20/2020   Infected sebaceous cyst 11/19/2019   Vitamin D deficiency 09/08/2019   Mild persistent asthma with exacerbation 09/08/2019   Hyperglycemia 03/08/2019   Allergic rhinitis 03/08/2019   Grade IV hemorrhoids 04/08/2018   Hemorrhoids 03/24/2018   External hemorrhoid, thrombosed 01/14/2018   Insomnia 08/28/2016   Cough 06/30/2015   Inappropriate sinus tachycardia 02/03/2015   Smoker 03/12/2012   Hidradenitis 12/19/2010   Encounter for well adult exam with abnormal findings 11/13/2010   Special screening for malignant neoplasm of prostate 08/23/2010   Hypothyroidism 03/16/2010   ANTERIOR PITUITARY HYPERFUNCTION 11/23/2009   GYNECOMASTIA 11/10/2009   Hyperlipidemia 11/24/2007   Anxiety and depression 11/24/2007   GERD 11/24/2007     Discharged Condition: stable  HPI: The patient is a 62 year old gentleman with a history of hyperlipidemia, hypothyroidism on replacement therapy, previous smoking with COPD recently on home oxygen at 3 L, mild OSA not on CPAP, and bicuspid aortic valve disease with severe regurgitation who was referred for consideration of valve replacement.  He reports a several month history of shortness of breath and was hospitalized in December 2023 with acute hypoxic respiratory failure in the setting of RSV infection.  He recovered  from this but has continued to have exertional fatigue and shortness of breath.  2D echo on 07/11/2022 was felt to show mild to moderate aortic insufficiency with a mean  gradient of 4 mmHg.  There was a question of a small density on the aortic valve but visualization was suboptimal.  The aorta appeared to be of normal size with a aortic root diameter of 2.95 cm.  Left ventricular ejection fraction was 60 to 65%.  He subsequently had a TEE performed on 08/01/2022 which showed a type I bicuspid aortic valve with severe eccentric aortic insufficiency with a PISA radius of 1 cm, ERO of 0.52 cm, regurgitant volume 67 cc, and holodiastolic reversal in the ascending aorta.  The aortic root diameter was 40 mm.  Cardiac catheterization was performed on 08/20/2022 and showed large ectatic coronary arteries with no significant stenosis.  PA pressure is mildly elevated with normal LVEDP.   CTA of the chest showed the aortic root to have a diameter of 4.5 cm at the sinus of valsalva level with no effacement of the STJ and normal ascending, arch and descending aorta. I don't think his aorta needs to be replaced. There are aneurysmal changes of the RCA and mid LCx with mild ectasia of the left main and proximal LAD but this can not be changed.   The patient lives with his wife.  He said that he feels fairly well overall but still has some exertional shortness of breath and fatigue.  He has been able to walk his dog without oxygen.  He denies any peripheral edema.  He denies chest pain or pressure.  He denies any dizziness or syncope.  Dr. Laneta Simmers reviewed the patient's diagnostic studies and determined surgical intervention would benefit this patient. He reviewed the treatment options as well as the risks and benefits with the patient. Christopher Arias was agreeable to proceed with surgery.  Hospital Course: Christopher Arias presented to [facility] and was brought to the operating room on 11/04/22. He underwent Aortic Valve Replacement utilizing a 27mm Inspiris Resilia Aortic Valve. He tolerated the procedure well and was transferred to the SICU in stable condition. He experienced mediastinal  bleeding and was brought back to the operating room on 06/03, he underwent evacuation of mediastinal hematoma and suture repair of bleeding from a sternal wire. He was returned to the SICU in stable condition. He was extubated the night of surgery without complication. High dose neo was transitioned to Norepi and Midodrine for blood pressure support. Theone Murdoch catheter was removed without complication. He had acute expected postoperative blood loss anemia and was transfused with 1 unit of PRBCs. Insulin drip was discontinued and SSI was started. Norepi was weaned as hemodynamics tolerated. He was in complete heart block with a junctional rhythm, he was DDD paced at 80. Chest tubes were removed without complication. CBGs were controlled, SSI was discontinued. He was volume overload and diuresed accordingly. He converted to a 2nd degree type 1 heart block, he was VVI paced at 50 and EP was consulted for possible pacer. EP recommended avoiding AV nodal blocking agents and would continue to monitor his rhythm. Empiric Maxipime was started due to right lung airspace disease. This was completed after 5 days and there was no sign of infection. Pulmonary hygiene with nebulizers, IS and ambulation was encouraged. He developed urinary retention, Flomax was started and foley catheter was placed. He continued to require 45L/min of HFNC, this was weaned as tolerated. He remained on 10mg  TID  of Midodrine for blood pressure support. He was felt stable for transfer to the progressive unit. He has had some atrial arrhythmia with intact conduction, but has also had pacing throughout the day yesterday and this am. Will continue to monitor throughout the weekend;made NPO Monday after midnight in anticipation of needing PPM. Oxygen was weaned as tolerated and he was saturating well on his baseline 3L [place]. Thrombocytopenia improved. His last platelet count was 112,000. Voiding trial was done on 06/09 and he began voiding without  difficulty. PPM was placed by the electrophysiology team on 06/11. He tolerated the procedure well and was transferred back to 4E. His incisions were healing well without sign of infection. Pacemaker was working well and his EPW were removed without complication. He was ambulating well and saturating well on 3-4L [place]. His blood pressure was stable and midodrine was discontinued. He was having sinus tachycardia but as discussed with Dr. Laneta Simmers his blood pressure was too soft to begin Metoprolol. As discussed with Dr. Laneta Simmers and EP Digoxin was started to assist with heart rate control. He remained stable with heart rates in the 100s-110s, the patient states his baseline heart rate was in the 90s before surgery. He was felt stable for discharge.   Consults:  Electrophysiology  Significant Diagnostic Studies:   TRANSESOPHOGEAL ECHO REPORT       Patient Name:   Christopher Arias Date of Exam: 08/01/2022 Medical Rec #:  [id number]    Height:       64.0 in Accession #:    [id number]   Weight:       170.8 lb Date of Birth:  02-Apr-1961   BSA:          1.829 m Patient Age:    61 years     BP:           140/50 mmHg Patient Gender: M            HR:           78 bpm. Exam Location:  Inpatient  Procedure: Transesophageal Echo  Indications:    Abnormal AV   History:        Patient has prior history of Echocardiogram examinations. Aortic                 Valve Disease.   Sonographer:    Eulah Pont RDCS Referring Phys: Wendall Stade  PROCEDURE: The transesophogeal probe was passed without difficulty through the esophogus of the patient. Sedation performed by different physician. The patient developed no complications during the procedure.   IMPRESSIONS    1. Left ventricular ejection fraction, by estimation, is 60 to 65%. The left ventricle has normal function. The left ventricular internal cavity size was mildly dilated. Left ventricular diastolic function could not be evaluated.  2. Right  ventricular systolic function is normal. The right ventricular size is normal.  3. Left atrial size was mildly dilated. No left atrial/left atrial appendage thrombus was detected.  4. The mitral valve is normal in structure. No evidence of mitral valve regurgitation.  5. Bicuspid Sievers type 1 valve with severe eccentric AR PISA radius 1 cm, holodiastolic reversals in ascending thoracic aorta, ERO 0.52 cm 2, and Regurgitant volume 67 cc. The aortic valve is bicuspid. Aortic valve regurgitation is severe.  6. Aortic dilatation noted. There is moderate dilatation of the aortic root, measuring 40 mm.  FINDINGS  Left Ventricle: Left ventricular ejection fraction, by estimation, is 60 to 65%. The left ventricle  has normal function. The left ventricular internal cavity size was mildly dilated. There is no left ventricular hypertrophy. Left ventricular diastolic function could not be evaluated.  Right Ventricle: The right ventricular size is normal. No increase in right ventricular wall thickness. Right ventricular systolic function is normal.  Left Atrium: Left atrial size was mildly dilated. No left atrial/left atrial appendage thrombus was detected.  Right Atrium: Right atrial size was normal in size.  Pericardium: There is no evidence of pericardial effusion.  Mitral Valve: The mitral valve is normal in structure. No evidence of mitral valve regurgitation.  Tricuspid Valve: The tricuspid valve is normal in structure. Tricuspid valve regurgitation is mild.  Aortic Valve: Bicuspid Sievers type 1 valve with severe eccentric AR PISA radius 1 cm, holodiastolic reversals in ascending thoracic aorta, ERO 0.52 cm 2, and Regurgitant volume 67 cc. The aortic valve is bicuspid. Aortic valve regurgitation is severe. Aortic regurgitation PHT measures 277 msec. Aortic valve mean gradient measures 6.0 mmHg. Aortic valve peak gradient measures 11.4 mmHg. Aortic valve area, by VTI measures  2.84 cm.  Pulmonic Valve: The pulmonic valve was normal in structure. Pulmonic valve regurgitation is trivial.  Aorta: Aortic dilatation noted. There is moderate dilatation of the aortic root, measuring 40 mm.  IAS/Shunts: No atrial level shunt detected by color flow Doppler.    LEFT VENTRICLE PLAX 2D LVOT diam:     2.60 cm LV SV:         83 LV SV Index:   45 LVOT Area:     5.31 cm    AORTIC VALVE AV Area (Vmax):    2.82 cm AV Area (Vmean):   2.36 cm AV Area (VTI):     2.84 cm AV Vmax:           169.00 cm/s AV Vmean:          118.000 cm/s AV VTI:            0.292 m AV Peak Grad:      11.4 mmHg AV Mean Grad:      6.0 mmHg LVOT Vmax:         89.90 cm/s LVOT Vmean:        52.400 cm/s LVOT VTI:          0.156 m LVOT/AV VTI ratio: 0.53 AI PHT:            277 msec    SHUNTS Systemic VTI:  0.16 m Systemic Diam: 2.60 cm  Charlton Haws MD Electronically signed by Charlton Haws MD Signature Date/Time: 08/01/2022/11:32:47 AM       Final       *INTRAOPERATIVE TRANSESOPHAGEAL REPORT *      Patient Name:   Christopher Arias   Date of Exam: 11/04/2022  Medical Rec #:  [id number]      Height:       65.0 in  Accession #:    [id number]     Weight:       171.5 lb  Date of Birth:  [birth year]     BSA:          1.85 m  Patient Age:    61 years       BP:           92/48 mmHg  Patient Gender: M              HR:           57 bpm.  Exam Location:  Anesthesiology  Transesophogeal exam was perform intraoperatively during surgical  procedure.  Patient was closely monitored under general anesthesia during the entirety  of  examination.   Indications:    Aortic Valve Abnormailty  Sonographer:     Darlys Gales  Performing Phys: [id number] Alleen Borne  Diagnosing Phys: Gaynelle Adu MD   Complications: No known complications during this procedure.  POST-OP IMPRESSIONS  _ Left Ventricle: The left ventricle is unchanged from pre-bypass.  _ Right Ventricle: The right ventricle  appears unchanged from pre-bypass.  _ Aortic Valve: A bioprosthetic valve was placed, leaflets are freely  mobile and  leaflets thin Size; 27mm. No regurgitation post repair. The gradient  recorded  across the prosthetic valve is within the expected range.  _ Mitral Valve: The mitral valve appears unchanged from pre-bypass.  _ Tricuspid Valve: The tricuspid valve appears unchanged from pre-bypass.  _ Pulmonic Valve: The pulmonic valve appears unchanged from pre-bypass.   PRE-OP FINDINGS   Left Ventricle: The left ventricle has normal systolic function, with an  ejection fraction of 55-60%. The cavity size was normal. There is no  increase in left ventricular wall thickness. No evidence of left  ventricular regional wall motion  abnormalities. There is no left ventricular hypertrophy.    Right Ventricle: The right ventricle has normal systolic function. The  cavity was normal. There is no increase in right ventricular wall  thickness.   Left Atrium: Left atrial size was not assessed. No left atrial/left atrial  appendage thrombus was detected.   Right Atrium: Right atrial size was not assessed.   Interatrial Septum: No atrial level shunt detected by color flow Doppler.   Pericardium: There is no evidence of pericardial effusion.   Mitral Valve: The mitral valve is normal in structure. Mitral valve  regurgitation is not visualized by color flow Doppler.   Tricuspid Valve: The tricuspid valve was normal in structure. Tricuspid  valve regurgitation was not visualized by color flow Doppler.   Aortic Valve: The aortic valve is bicuspid Aortic valve regurgitation is  moderate by color flow Doppler. The jet is eccentric anteriorly directed.    Pulmonic Valve: The pulmonic valve was normal in structure.  Pulmonic valve regurgitation is trivial by color flow Doppler.    +--------------+--------++  LEFT VENTRICLE          +--------------+--------++  PLAX 2D                  +--------------+--------++  LVOT diam:    2.50 cm   +--------------+--------++  LVOT Area:    4.91 cm  +--------------+--------++                         +--------------+--------++   +-------------+------------++  AORTIC VALVE               +-------------+------------++  AV Vmax:     150.00 cm/s   +-------------+------------++  AV Vmean:    104.000 cm/s  +-------------+------------++  AV VTI:      0.309 m       +-------------+------------++  AV Peak Grad:9.0 mmHg      +-------------+------------++  AV Mean Grad:5.0 mmHg      +-------------+------------++  AR PHT:      410 msec      +-------------+------------++     +--------------+-------+  SHUNTS                +--------------+-------+  Systemic Diam:2.50 cm  +--------------+-------+     Chrissie Noa  Sampson Goon MD  Electronically signed by Gaynelle Adu MD  Signature Date/Time: 11/04/2022/2:17:43 PM        Final      RIGHT/LEFT HEART CATH AND CORONARY ANGIOGRAPHY  Dominance: Right Left Main  Vessel was injected. Vessel is normal in caliber. Vessel is angiographically normal.    Left Anterior Descending  Vessel was injected. Vessel is large. Vessel is angiographically normal.    First Diagonal Branch  Vessel was injected. Vessel is large in size. Vessel is angiographically normal.    Third Diagonal Branch  Vessel was injected. Vessel is small in size. Vessel is angiographically normal.    Ramus Intermedius  Vessel was injected. Vessel is normal in caliber. Vessel is Large Vessel is angiographically normal. The vessel has a moderate aneurysm.    Left Circumflex  Vessel was injected. Vessel is large. Vessel is angiographically normal. The vessel is moderately tortuous. The vessel is moderately ectatic. The vessel has a moderate aneurysm.    Right Coronary Artery  Vessel was injected. Vessel is large and very large. Vessel is angiographically normal. The vessel  is moderately ectatic. The vessel has a moderate aneurysm.    Acute Marginal Branch  Vessel was injected. Vessel is small in size. Vessel is angiographically normal.    Right Ventricular Branch  Vessel was injected. Vessel is small in size. Vessel is angiographically normal.    Right Posterior Atrioventricular Artery  Vessel was injected. Vessel is moderate in size. Vessel is angiographically normal.    First Right Posterolateral Branch  Vessel was injected. Vessel is small in size. Vessel is angiographically normal.      Treatments: surgery: CARDIOVASCULAR SURGERY OPERATIVE NOTE  11/04/2022 Christopher Arias [id number] Surgeon:  Alleen Borne, MD First Assistant: Aloha Gell,  PA-C: An experienced assistant was required given the complexity of this surgery and the standard of surgical care. The assistant was needed for exposure, dissection, suctioning, retraction of delicate tissues and sutures, instrument exchange and for overall help during this procedure.  Preoperative Diagnosis:  Bicuspid severe aortic valve insufficiency Postoperative Diagnosis:  Same Procedure: Median Sternotomy Extracorporeal circulation 3.   Aortic valve replacement using a 27 mm Edwards INSPIRIS RESILIA pericardial valve.  Surgeon: Alleen Borne, MD  First Assistant: RNFA Preoperative Diagnosis: mediastinal bleeding s/p AVR Postoperative Diagnosis: Same  Procedure: 1.  Median Sternotomy 2. Evacuation of mediastinal hematoma 3.  Suture repair of bleeding from sternal wire.   Discharge Exam: Blood pressure 105/83, pulse (!) 107, temperature 98.4 F (36.9 C), temperature source Oral, resp. rate 20, height 5\' 5"  (1.651 m), weight 77.2 kg, SpO2 96 %. General appearance: alert, cooperative, and no distress Neurologic: intact Heart: sinus tachycardia, PVCs, no murmur Lungs: some rhonchi, otherwise clear to auscultation Abdomen: soft, non-tender; bowel sounds normal; no masses,  no  organomegaly Extremities: extremities normal, atraumatic, no cyanosis or edema Wound: Clean and dry, no erythema or sign of infection  Discharge Medications:  The patient has been discharged on:   1.Beta Blocker:  Yes [   ]                              No   [  X ]                              If No, reason: Hypotension  2.Ace Inhibitor/ARB: Yes [   ]  No  [  X  ]                                     If No, reason: Hypotension  3.Statin:   Yes [ X  ]                  No  [   ]                  If No, reason:  4.Ecasa:  Yes  [ X  ]                  No   [   ]                  If No, reason:  Patient had ACS upon admission: No  Plavix/P2Y12 inhibitor: Yes [   ]                                      No  [  X ]     Discharge Instructions     Amb Referral to Cardiac Rehabilitation   Complete by: As directed    Diagnosis: Valve Replacement   Valve: Aortic   After initial evaluation and assessments completed: Virtual Based Care may be provided alone or in conjunction with Phase 2 Cardiac Rehab based on patient barriers.: Yes   Intensive Cardiac Rehabilitation (ICR) [facility] location only OR Traditional Cardiac Rehabilitation (TCR) *If criteria for ICR are not met will enroll in TCR [facility] only): Yes      Allergies as of 11/14/2022       Reactions   Atenolol Other (See Comments)   Nose bleeds        Medication List     STOP taking these medications    amoxicillin-clavulanate 875-125 MG tablet Commonly known as: AUGMENTIN   predniSONE 20 MG tablet Commonly known as: DELTASONE       TAKE these medications    albuterol 108 (90 Base) MCG/ACT inhaler Commonly known as: VENTOLIN HFA Inhale 2 puffs into the lungs every 6 (six) hours as needed for wheezing or shortness of breath.   albuterol (2.5 MG/3ML) 0.083% nebulizer solution Commonly known as: PROVENTIL Take 3 mLs (2.5 mg total) by nebulization every 6 (six) hours as needed for  wheezing or shortness of breath.   aspirin EC 81 MG tablet Take 1 tablet (81 mg total) by mouth daily. Swallow whole.   atorvastatin 20 MG tablet Commonly known as: LIPITOR Take 1 tablet (20 mg total) by mouth daily.   Breztri Aerosphere 160-9-4.8 MCG/ACT Aero Generic drug: Budeson-Glycopyrrol-Formoterol Inhale 2 puffs into the lungs in the morning and at bedtime.   clonazePAM 1 MG tablet Commonly known as: KLONOPIN TAKE 1 TABLET BY MOUTH 2 TIMES DAILY AS NEEDED. What changed: See the new instructions.   digoxin 0.125 MG tablet Commonly known as: LANOXIN Take 1 tablet (0.125 mg total) by mouth daily.   diphenhydramine-acetaminophen 25-500 MG Tabs tablet Commonly known as: TYLENOL PM Take 2 tablets by mouth at bedtime.   Fe Fum-Vit C-Vit B12-FA Caps capsule Commonly known as: TRIGELS-F FORTE Take 1 capsule by mouth daily after breakfast.   levothyroxine 100 MCG tablet Commonly known as: SYNTHROID Take 1 tablet (100 mcg total) by mouth daily.  oxyCODONE 5 MG immediate release tablet Commonly known as: Oxy IR/ROXICODONE Take 1 tablet (5 mg total) by mouth every 6 (six) hours as needed for severe pain.   OXYGEN Inhale 2-3 L into the lungs at bedtime. As needed during the day   sertraline 100 MG tablet Commonly known as: ZOLOFT Take 2 tablets (200 mg total) by mouth daily. What changed: when to take this   tamsulosin 0.4 MG Caps capsule Commonly known as: FLOMAX Take 1 capsule (0.4 mg total) by mouth daily.   varenicline 1 MG tablet Commonly known as: CHANTIX TAKE 1 TABLET BY MOUTH TWICE A DAY What changed: when to take this        Follow-up Information     [facility] Follow up on 12/11/2022.   Why: To get chest xray at 10:30AM Contact information: [address] [place] [address]        Alleen Borne, MD Follow up on 12/11/2022.   Specialty: Cardiothoracic Surgery Why: Follow up appointment is at 11:30AM Contact  information: [address] [place] [address] [phone]         Little Ishikawa, MD Follow up on 11/27/2022.   Specialties: Cardiology, Radiology Why: Cardiology follow up at 1:30PM Contact information: [address] [place] [address] [phone]         [facility] Follow up on 12/16/2022.   Specialty: Cardiology Why: Echocardiogram at 10:35AM Contact information: [address] [id number] [place]he[place] [address] [phone]                Signed:  Jenny Reichmann, PA-C  11/14/2022, 8:53 AM

## 2022-11-06 ENCOUNTER — Inpatient Hospital Stay (HOSPITAL_COMMUNITY): Payer: BC Managed Care – PPO

## 2022-11-06 LAB — BASIC METABOLIC PANEL
Anion gap: 7 (ref 5–15)
BUN: 17 mg/dL (ref 8–23)
CO2: 25 mmol/L (ref 22–32)
Calcium: 7.7 mg/dL — ABNORMAL LOW (ref 8.9–10.3)
Chloride: 101 mmol/L (ref 98–111)
Creatinine, Ser: 0.8 mg/dL (ref 0.61–1.24)
GFR, Estimated: >60 mL/min (ref >60)
Glucose, Bld: 109 mg/dL — ABNORMAL HIGH (ref 70–99)
Potassium: 4.3 mmol/L (ref 3.5–5.1)
Sodium: 133 mmol/L — ABNORMAL LOW (ref 135–145)

## 2022-11-06 LAB — TYPE AND SCREEN
Unit division: 0
Unit division: 0

## 2022-11-06 LAB — BPAM FFP
Blood Product Expiration Date: 202406082359
Blood Product Expiration Date: 202406082359
Blood Product Expiration Date: 202406082359
ISSUE DATE / TIME: 202406031646
ISSUE DATE / TIME: 202406031646
ISSUE DATE / TIME: 202406040921
Unit Type and Rh: 6200
Unit Type and Rh: 6200
Unit Type and Rh: 6200

## 2022-11-06 LAB — GLUCOSE, CAPILLARY
Glucose-Capillary: 110 mg/dL — ABNORMAL HIGH (ref 70–99)
Glucose-Capillary: 128 mg/dL — ABNORMAL HIGH (ref 70–99)
Glucose-Capillary: 118 mg/dL — ABNORMAL HIGH (ref 70–99)
Glucose-Capillary: 115 mg/dL — ABNORMAL HIGH (ref 70–99)
Glucose-Capillary: 116 mg/dL — ABNORMAL HIGH (ref 70–99)

## 2022-11-06 LAB — PREPARE FRESH FROZEN PLASMA: Unit division: 0

## 2022-11-06 LAB — BPAM RBC
ISSUE DATE / TIME: 202406031624
Unit Type and Rh: 6200

## 2022-11-06 LAB — CBC
HCT: 26 % — ABNORMAL LOW (ref 39.0–52.0)
Hemoglobin: 8.9 g/dL — ABNORMAL LOW (ref 13.0–17.0)
MCH: 30.2 pg (ref 26.0–34.0)
MCHC: 34.2 g/dL (ref 30.0–36.0)
MCV: 88.1 fL (ref 80.0–100.0)
Platelets: 86 10*3/uL — ABNORMAL LOW (ref 150–400)
RBC: 2.95 MIL/uL — ABNORMAL LOW (ref 4.22–5.81)
RDW: 14.3 % (ref 11.5–15.5)
WBC: 9.8 10*3/uL (ref 4.0–10.5)
nRBC: 0 % (ref 0.0–0.2)

## 2022-11-06 MED ORDER — BUDESONIDE 0.5 MG/2ML IN SUSP
RESPIRATORY_TRACT | Status: AC
  Administered 2022-11-06: 0.5 mg via RESPIRATORY_TRACT
  Filled 2022-11-06: qty 2

## 2022-11-06 MED ORDER — ALBUTEROL SULFATE (2.5 MG/3ML) 0.083% IN NEBU: 2.5000 mg | INHALATION_SOLUTION | RESPIRATORY_TRACT | Status: DC | PRN

## 2022-11-06 MED ORDER — FUROSEMIDE 10 MG/ML IJ SOLN
40.0000 mg | Freq: Two times a day (BID) | INTRAMUSCULAR | Status: AC
  Administered 2022-11-06 (×2): 40 mg via INTRAVENOUS
  Filled 2022-11-06 (×2): qty 4

## 2022-11-06 MED ORDER — SODIUM CHLORIDE 0.9 % IV SOLN
2.0000 g | Freq: Three times a day (TID) | INTRAVENOUS | Status: DC
  Administered 2022-11-06 – 2022-11-11 (×15): 2 g via INTRAVENOUS
  Filled 2022-11-06 (×15): qty 12.5

## 2022-11-06 MED ORDER — POTASSIUM CHLORIDE CRYS ER 20 MEQ PO TBCR
20.0000 meq | EXTENDED_RELEASE_TABLET | Freq: Two times a day (BID) | ORAL | Status: AC
  Administered 2022-11-06 (×2): 20 meq via ORAL
  Filled 2022-11-06 (×2): qty 1

## 2022-11-06 MED ORDER — IPRATROPIUM-ALBUTEROL 0.5-2.5 (3) MG/3ML IN SOLN
RESPIRATORY_TRACT | Status: AC
  Administered 2022-11-06: 3 mL via RESPIRATORY_TRACT
  Filled 2022-11-06: qty 3

## 2022-11-06 MED ORDER — IPRATROPIUM-ALBUTEROL 0.5-2.5 (3) MG/3ML IN SOLN
3.0000 mL | RESPIRATORY_TRACT | Status: DC
  Administered 2022-11-06 – 2022-11-07 (×8): 3 mL via RESPIRATORY_TRACT
  Filled 2022-11-06 (×8): qty 3

## 2022-11-06 MED ORDER — BUDESONIDE 0.5 MG/2ML IN SUSP
0.5000 mg | Freq: Two times a day (BID) | RESPIRATORY_TRACT | Status: DC
  Administered 2022-11-06 – 2022-11-14 (×14): 0.5 mg via RESPIRATORY_TRACT
  Filled 2022-11-06 (×17): qty 2

## 2022-11-06 MED FILL — Lidocaine HCl Local Preservative Free (PF) Inj 2%: INTRAMUSCULAR | Qty: 14 | Status: AC

## 2022-11-06 MED FILL — Heparin Sodium (Porcine) Inj 1000 Unit/ML: Qty: 1000 | Status: AC

## 2022-11-06 MED FILL — Potassium Chloride Inj 2 mEq/ML: INTRAVENOUS | Qty: 40 | Status: AC

## 2022-11-06 NOTE — Progress Notes (Signed)
2 Days Post-Op Procedure(s) (LRB): EXPLORATION POST OPERATIVE OPEN HEART (N/A) Subjective: No complaints. Slept some. Pain under control.  Objective: Vital signs in last 24 hours: Temp:  [98 F (36.7 C)-100.8 F (38.2 C)] 98 F (36.7 C) (06/04 2100) Pulse Rate:  [61-115] 80 (06/05 0300) Cardiac Rhythm: Sinus tachycardia (06/04 2005) Resp:  [11-32] 18 (06/05 0300) BP: (87-124)/(57-89) 99/63 (06/05 0300) SpO2:  [90 %-96 %] 91 % (06/05 0300) Arterial Line BP: (93-146)/(41-96) 103/54 (06/05 0300) Weight:  [82.3 kg] 82.3 kg (06/05 0500)  Hemodynamic parameters for last 24 hours:    Intake/Output from previous day: 06/04 0701 - 06/05 0700 In: 1488.8 [I.V.:922.8; Blood:366; IV Piggyback:200] Out: 3050 [Urine:2660; Chest Tube:390] Intake/Output this shift: No intake/output data recorded.  General appearance: alert and cooperative Neurologic: intact Heart: regular rate and rhythm, S1, S2 normal, no murmur Lungs: clear to auscultation bilaterally Extremities: edema mild Wound: dressing dry  Lab Results: Recent Labs    11/05/22 1633 11/06/22 0500  WBC 9.3 9.8  HGB 9.0* 8.9*  HCT 26.6* 26.0*  PLT 77* 86*   BMET:  Recent Labs    11/05/22 1633 11/06/22 0500  NA 132* 133*  K 4.0 4.3  CL 101 101  CO2 21* 25  GLUCOSE 144* 109*  BUN 15 17  CREATININE 1.12 0.80  CALCIUM 7.4* 7.7*    PT/INR:  Recent Labs    11/04/22 1815  LABPROT 19.1*  INR 1.6*   ABG    Component Value Date/Time   PHART 7.324 (L) 11/04/2022 2300   HCO3 22.1 11/04/2022 2300   TCO2 23 11/04/2022 2300   ACIDBASEDEF 4.0 (H) 11/04/2022 2300   O2SAT 97 11/04/2022 2300   CBG (last 3)  Recent Labs    11/05/22 2016 11/05/22 2331 11/06/22 0338  GLUCAP 114* 104* 110*   CXR: new right lung air space disease, likely atelectasis.  Assessment/Plan:  POD 2 Hemodynamically stable on low dose NE and midodrine. Wean off NE. Continue midodrine. Rhythm under pacer looks like CHB with junctional 60's.  Will check ECG. Continue DDD pacing at 80. Sensing atrium and pacing ventricle at 100. No beta blocker.  DC chest tubes  Volume excess: -1500 cc yesterday and wt down 3.3 lbs. 10 lbs over preop. Continue diuresis and KCL.  IS, OOB, ambulate.  Glucose under good control and no DM with normal Hgb A1c preop. DC CBG's and SSI.  LOS: 2 days    Christopher Arias 11/06/2022

## 2022-11-06 NOTE — Progress Notes (Signed)
EVENING ROUNDS NOTE :     [address]       [place] [address]             [phone]                 2 Days Post-Op Procedure(s) (LRB): EXPLORATION POST OPERATIVE OPEN HEART (N/A)   Total Length of Stay:  LOS: 2 days  Events:   No events Resting comfortably On HHF    BP 104/77   Pulse 83   Temp 98.2 F (36.8 C) (Oral)   Resp (!) 23   Ht 5\' 5"  (1.651 m)   Wt 82.3 kg   SpO2 93%   BMI 30.19 kg/m      FiO2 (%):  [70 %] 70 %   sodium chloride 10 mL/hr at 11/06/22 1400   sodium chloride     lactated ringers     lactated ringers 20 mL/hr at 11/04/22 1630   norepinephrine (LEVOPHED) Adult infusion Stopped (11/06/22 0554)    I/O last 3 completed shifts: In: 4081.5 [I.V.:3212.7; Blood:366; IV Piggyback:502.8] Out: 4855 [Urine:3835; Chest Tube:1020]      Latest Ref Rng & Units 11/06/2022    5:00 AM 11/05/2022    4:33 PM 11/05/2022    4:19 AM  CBC  WBC 4.0 - 10.5 K/uL 9.8  9.3  8.3   Hemoglobin 13.0 - 17.0 g/dL 8.9  9.0  7.8   Hematocrit 39.0 - 52.0 % 26.0  26.6  23.5   Platelets 150 - 400 K/uL 86  77  101        Latest Ref Rng & Units 11/06/2022    5:00 AM 11/05/2022    4:33 PM 11/05/2022    4:19 AM  BMP  Glucose 70 - 99 mg/dL 846  962  952   BUN 8 - 23 mg/dL 17  15  10    Creatinine 0.61 - 1.24 mg/dL 8.41  3.24  4.01   Sodium 135 - 145 mmol/L 133  132  135   Potassium 3.5 - 5.1 mmol/L 4.3  4.0  4.0   Chloride 98 - 111 mmol/L 101  101  107   CO2 22 - 32 mmol/L 25  21  21    Calcium 8.9 - 10.3 mg/dL 7.7  7.4  7.0     ABG    Component Value Date/Time   PHART 7.324 (L) 11/04/2022 2300   PCO2ART 42.7 11/04/2022 2300   PO2ART 102 11/04/2022 2300   HCO3 22.1 11/04/2022 2300   TCO2 23 11/04/2022 2300   ACIDBASEDEF 4.0 (H) 11/04/2022 2300   O2SAT 97 11/04/2022 2300       Brynda Greathouse, MD 11/06/2022 4:56 PM

## 2022-11-06 NOTE — TOC CM/SW Note (Signed)
Transition of Care [facility]) - Inpatient Brief Assessment   Patient Details  Name: Christopher Arias MRN: [id number] Date of Birth: [birth year]  [facility]) CM/SW Contact:    Gala Lewandowsky, RN Phone Number: 11/06/2022, 2:06 PM   Clinical Narrative: Patient POD-2 AVR reexploration. Patient was discussed in progression rounds and continues on oxygen. Staff RN states chest tube removal today. PTA patient was independent from home with support of spouse. Patient does not use any DME at this time. Patient has insurance, PCP, and has no issues getting to appointments. Case Manager will continue to follow for transition of care needs as the patient progresses.    Transition of Care Asessment: Insurance and Status: Insurance coverage has been reviewed Patient has primary care physician: Yes Home environment has been reviewed: reviewed Prior level of function:: independent Prior/Current Home Services: No current home services Social Determinants of Health Reivew: SDOH reviewed no interventions necessary Readmission risk has been reviewed: Yes Transition of care needs: no transition of care needs at this time

## 2022-11-06 NOTE — Progress Notes (Signed)
Pharmacy Antibiotic Note  Christopher Arias is a 62 y.o. male admitted on 11/04/2022 with new R-lung opacity concerning for pneumonia.  Pharmacy has been consulted for Cefepime dosing. WBC within normal limits. Afebrile. No current cultures. SCr 0.8  Plan: Cefepime 2g IV every 8 hours.  Monitor renal function, clinical status, and culture results.    Height: 5\' 5"  (165.1 cm) Weight: 82.3 kg (181 lb 7 oz) IBW/kg (Calculated) : 61.5  Temp (24hrs), Avg:98.3 F (36.8 C), Min:98 F (36.7 C), Max:98.6 F (37 C)  Recent Labs  Lab 11/04/22 1229 11/04/22 1708 11/04/22 1815 11/05/22 0419 11/05/22 1633 11/06/22 0500  WBC 15.6*  --  6.7 8.3 9.3 9.8  CREATININE  --  0.60* 0.76 0.82 1.12 0.80    Estimated Creatinine Clearance: 95.7 mL/min (by C-G formula based on SCr of 0.8 mg/dL).    Allergies  Allergen Reactions   Atenolol Other (See Comments)    Nose bleeds    Antimicrobials this admission: Cefepime 6/5 >>  Dose adjustments this admission:  Microbiology results:   Thank you for allowing pharmacy to be a part of this patient's care.  Link Snuffer, PharmD, BCPS, BCCCP Clinical Pharmacist Please refer to [facility] for [facility] numbers 11/06/2022 5:32 PM

## 2022-11-07 ENCOUNTER — Inpatient Hospital Stay (HOSPITAL_COMMUNITY): Payer: BC Managed Care – PPO

## 2022-11-07 DIAGNOSIS — Z952 Presence of prosthetic heart valve: Secondary | ICD-10-CM

## 2022-11-07 DIAGNOSIS — I442 Atrioventricular block, complete: Secondary | ICD-10-CM

## 2022-11-07 LAB — CBC
HCT: 25 % — ABNORMAL LOW (ref 39.0–52.0)
Hemoglobin: 8.5 g/dL — ABNORMAL LOW (ref 13.0–17.0)
MCH: 31 pg (ref 26.0–34.0)
MCHC: 34 g/dL (ref 30.0–36.0)
MCV: 91.2 fL (ref 80.0–100.0)
Platelets: 94 10*3/uL — ABNORMAL LOW (ref 150–400)
RBC: 2.74 MIL/uL — ABNORMAL LOW (ref 4.22–5.81)
RDW: 14.4 % (ref 11.5–15.5)
WBC: 7.2 10*3/uL (ref 4.0–10.5)
nRBC: 0 % (ref 0.0–0.2)

## 2022-11-07 LAB — BASIC METABOLIC PANEL
Anion gap: 6 (ref 5–15)
BUN: 22 mg/dL (ref 8–23)
CO2: 26 mmol/L (ref 22–32)
Calcium: 7.5 mg/dL — ABNORMAL LOW (ref 8.9–10.3)
Chloride: 101 mmol/L (ref 98–111)
Creatinine, Ser: 0.86 mg/dL (ref 0.61–1.24)
GFR, Estimated: >60 mL/min (ref >60)
Glucose, Bld: 92 mg/dL (ref 70–99)
Potassium: 4 mmol/L (ref 3.5–5.1)
Sodium: 133 mmol/L — ABNORMAL LOW (ref 135–145)

## 2022-11-07 MED ORDER — TAMSULOSIN HCL 0.4 MG PO CAPS
0.4000 mg | ORAL_CAPSULE | Freq: Every day | ORAL | Status: DC
  Administered 2022-11-07 – 2022-11-14 (×8): 0.4 mg via ORAL
  Filled 2022-11-07 (×8): qty 1

## 2022-11-07 MED ORDER — FUROSEMIDE 10 MG/ML IJ SOLN
40.0000 mg | Freq: Two times a day (BID) | INTRAMUSCULAR | Status: AC
  Administered 2022-11-07 (×2): 40 mg via INTRAVENOUS
  Filled 2022-11-07 (×2): qty 4

## 2022-11-07 MED ORDER — POTASSIUM CHLORIDE CRYS ER 20 MEQ PO TBCR
20.0000 meq | EXTENDED_RELEASE_TABLET | Freq: Three times a day (TID) | ORAL | Status: AC
  Administered 2022-11-07 (×3): 20 meq via ORAL
  Filled 2022-11-07 (×3): qty 1

## 2022-11-07 NOTE — Progress Notes (Signed)
RT note. Patient placed on 6 L salter at this time sat 93-94% with no labored breathing noted. Heated HFNC on stand by.   11/07/22 1622  Therapy Vitals  Pulse Rate (!) 117  Resp 16  MEWS Score/Color  MEWS Score 3  MEWS Score Color Yellow  Oxygen Therapy/Pulse Ox  O2 Device (S)  HFNC (Patient placed on salter)  O2 Therapy Oxygen humidified  O2 Flow Rate (L/min) 6 L/min  SpO2 93 %

## 2022-11-07 NOTE — Consult Note (Signed)
ELECTROPHYSIOLOGY CONSULT NOTE    Patient ID: Christopher Arias MRN: [id number], DOB/AGE: 09-25-1960 62 y.o.  Admit date: 11/04/2022 Date of Consult: 11/07/2022  Primary Physician: Corwin Levins, MD Primary Cardiologist: Little Ishikawa, MD  Electrophysiologist: New   Referring Provider: Dr. Laneta Simmers  Patient Profile: Christopher Arias is a 62 y.o. male with a history of HLD, Hypothyroidism, previous COPD on Home O2, mild OSA not on CPAP, 1st degree AV block, and Bicuspid AV with severe AI who is being seen today for the evaluation of AV block s/p AVR at the request of Dr. Laneta Simmers.  HPI:  Christopher Arias is a 62 y.o. male with medical history as above.   Pt presented for planned AVR 6/3. Pt was re-explored same day for post op bleeding and hematoma.   Pt had high neo requirement and was transitioned to Levophed and midodrine 6/4. Chest tubes out 6/5.  Levophed stopped this am. EP asked to see with intermittent second degree AV block.   Pt sore but upright in chair this am. In good spirits. Denies dizziness or lightheadedness at rest. Still on [place]. Denies h/o syncope.   Labs Potassium4.0 (06/06 4098) Magnesium  2.0 (06/04 1633) Creatinine, ser  0.86 (06/06 0436) PLT  94* (06/06 0436) HGB  8.5* (06/06 0436) WBC 7.2 (06/06 0436)  .     Allergies, Medical, Surgical, Social, and Family Histories have been reviewed and are referenced here-in when relevant for medical decision making.    Medications Prior to Admission  Medication Sig Dispense Refill Last Dose   albuterol (PROVENTIL) (2.5 MG/3ML) 0.083% nebulizer solution Take 3 mLs (2.5 mg total) by nebulization every 6 (six) hours as needed for wheezing or shortness of breath. 75 mL 12 Past Month   albuterol (VENTOLIN HFA) 108 (90 Base) MCG/ACT inhaler Inhale 2 puffs into the lungs every 6 (six) hours as needed for wheezing or shortness of breath. 18 g 3 Past Week   atorvastatin (LIPITOR) 20 MG tablet Take 1 tablet (20 mg total) by mouth  daily. 90 tablet 3 11/04/2022   Budeson-Glycopyrrol-Formoterol (BREZTRI AEROSPHERE) 160-9-4.8 MCG/ACT AERO Inhale 2 puffs into the lungs in the morning and at bedtime. 10.7 g 5 11/04/2022   clonazePAM (KLONOPIN) 1 MG tablet TAKE 1 TABLET BY MOUTH 2 TIMES DAILY AS NEEDED. (Patient taking differently: Take 1 mg by mouth 2 (two) times daily as needed for anxiety.) 60 tablet 2 11/04/2022   diphenhydramine-acetaminophen (TYLENOL PM) 25-500 MG TABS tablet Take 2 tablets by mouth at bedtime.   11/03/2022   levothyroxine (SYNTHROID) 100 MCG tablet Take 1 tablet (100 mcg total) by mouth daily. 90 tablet 3 11/04/2022   OXYGEN Inhale 2-3 L into the lungs at bedtime. As needed during the day   11/03/2022   predniSONE (DELTASONE) 20 MG tablet Take 2 tablets (40 mg total) by mouth daily with breakfast. 10 tablet 0 Past Month   sertraline (ZOLOFT) 100 MG tablet Take 2 tablets (200 mg total) by mouth daily. (Patient taking differently: Take 200 mg by mouth at bedtime.) 180 tablet 3 11/03/2022   varenicline (CHANTIX) 1 MG tablet TAKE 1 TABLET BY MOUTH TWICE A DAY (Patient taking differently: Take 1 mg by mouth daily.) 180 tablet 2 11/03/2022   amoxicillin-clavulanate (AUGMENTIN) 875-125 MG tablet Take 1 tablet by mouth 2 (two) times daily. (Patient not taking: Reported on 10/31/2022) 10 tablet 0 Completed Course    Inpatient Medications:   sodium chloride   Intravenous Once   acetaminophen  1,000 mg Oral Q6H   Or   acetaminophen (TYLENOL) oral liquid 160 mg/5 mL  1,000 mg Per Tube Q6H   aspirin EC  81 mg Oral Daily   atorvastatin  20 mg Oral Daily   bisacodyl  10 mg Oral Daily   Or   bisacodyl  10 mg Rectal Daily   budesonide (PULMICORT) nebulizer solution  0.5 mg Nebulization BID   Chlorhexidine Gluconate Cloth  6 each Topical Daily   docusate sodium  200 mg Oral Daily   Fe Fum-Vit C-Vit B12-FA  1 capsule Oral QPC breakfast   fluticasone furoate-vilanterol  1 puff Inhalation Daily   furosemide  40 mg Intravenous BID    ipratropium-albuterol  3 mL Nebulization Q4H   levothyroxine  100 mcg Oral Daily   midodrine  10 mg Oral TID WC   pantoprazole  40 mg Oral Daily   potassium chloride  20 mEq Oral TID   sertraline  200 mg Oral QHS   sodium chloride flush  3 mL Intravenous Q12H   tamsulosin  0.4 mg Oral Daily   umeclidinium bromide  1 puff Inhalation Daily   varenicline  1 mg Oral Daily    Allergies:  Allergies  Allergen Reactions   Atenolol Other (See Comments)    Nose bleeds    Family History  Problem Relation Age of Onset   Heart disease Father    Stroke Other    COPD Other    Heart disease Other    Colon cancer Neg Hx      Physical Exam: Vitals:   11/07/22 0426 11/07/22 0500 11/07/22 0600 11/07/22 0700  BP:  104/87 111/70 94/67  Pulse:  (!) 106 82 89  Resp:  13 11 13   Temp:      TempSrc:      SpO2: 96% 93% (!) 88% 92%  Weight:  80.3 kg    Height:        GEN- NAD, A&O x 3, normal affect HEENT: Normocephalic, atraumatic Lungs- CTAB, Normal effort.  Heart- Regular rate and rhythm, No M/G/R.  GI- Soft, NT, ND.  Extremities- No clubbing, cyanosis, or edema   Radiology/Studies:   [facility] 08/20/2022   Angiographically no significant CAD but proximal vessel aneurysmal and ectatic dilation of both the Left and Right Coronary Arteries   Normal Right Heart Cath Pressures with minimal pulmonary hypertension.  Normal Cardiac Output-Index   LV end diastolic pressure is normal.  ZOX:0/9 likely shows CHB with escape in the 40s and QRS 142 in LBBB pattern  (personally reviewed)  Baseline EKG 10/31/2022 shows NSR in 70s with 1st degree AV block ~210 ms   TELEMETRY: NSR 60-70s with intermittent dropped beats and periods of second degree AV block (Mobitz 1 and 2), as well as intermittent sinus tachycardia (personally reviewed)  Assessment/Plan: Advanced AV block s/p TAVR 1st degree AV block at baseline Yesterday appeared to be in CHB.  Today conduction has improved with periods of 1:1 sinus  and sinus tach today, but still intermittent 2nd degree block EP will follow along in case pacing is needed.   S/p TAVR Otherwise stable course.  Chest tubes out 6/5  EP will follow along. Avoid AV nodal agents.   For questions or updates, please contact [facility] Please consult [email or web address] for contact info under Cardiology/STEMI.  Dustin Flock, PA-C  11/07/2022 7:45 AM

## 2022-11-07 NOTE — Progress Notes (Signed)
Patient ID: Christopher Arias, male   DOB: 09/29/1960, 62 y.o.   MRN: [id number]  TCTS Evening Rounds:  Hemodynamically stable Rhythm appears sinus 70's with 1st degree AV block at this time.  Foley reinserted this am for urinary retention. Diuresing well.  Oxygen requirement improving.

## 2022-11-07 NOTE — Progress Notes (Signed)
3 Days Post-Op Procedure(s) (LRB): AVR and reexploration for postop bleeding.  Subjective: Pain well controlled. Feels well. Doing IS independently and getting 1250 cc. Ambulating.  Had urinary retention overnight after foley removed last night and had in and out cath with some clots present.  Objective: Vital signs in last 24 hours: Temp:  [97.8 F (36.6 C)-98.6 F (37 C)] 98.4 F (36.9 C) (06/06 0400) Pulse Rate:  [79-107] 89 (06/06 0700) Cardiac Rhythm: Ventricular paced;A-V Sequential paced (06/05 2000) Resp:  [11-23] 13 (06/06 0700) BP: (86-128)/(64-93) 94/67 (06/06 0700) SpO2:  [87 %-100 %] 92 % (06/06 0700) Arterial Line BP: (83-126)/(53-69) 83/53 (06/05 1300) FiO2 (%):  [50 %-70 %] 50 % (06/06 0426) Weight:  [80.3 kg] 80.3 kg (06/06 0500)  Hemodynamic parameters for last 24 hours:    Intake/Output from previous day: 06/05 0701 - 06/06 0700 In: 891 [P.O.:480; I.V.:111; IV Piggyback:300] Out: 2815 [Urine:2815] Intake/Output this shift: No intake/output data recorded.  General appearance: alert and cooperative Neurologic: intact Heart: regular rate and rhythm, S1, S2 normal, no murmur Lungs: clear to auscultation bilaterally Extremities: edema mild Wound: incision ok  Lab Results: Recent Labs    11/06/22 0500 11/07/22 0436  WBC 9.8 7.2  HGB 8.9* 8.5*  HCT 26.0* 25.0*  PLT 86* 94*   BMET:  Recent Labs    11/06/22 0500 11/07/22 0436  NA 133* 133*  K 4.3 4.0  CL 101 101  CO2 25 26  GLUCOSE 109* 92  BUN 17 22  CREATININE 0.80 0.86  CALCIUM 7.7* 7.5*    PT/INR:  Recent Labs    11/04/22 1815  LABPROT 19.1*  INR 1.6*   ABG    Component Value Date/Time   PHART 7.324 (L) 11/04/2022 2300   HCO3 22.1 11/04/2022 2300   TCO2 23 11/04/2022 2300   ACIDBASEDEF 4.0 (H) 11/04/2022 2300   O2SAT 97 11/04/2022 2300   CBG (last 3)  Recent Labs    11/06/22 1217 11/06/22 1558 11/06/22 2026  GLUCAP 118* 115* 116*   CXR: improved aeration. Right lung  air space disease improved  Assessment/Plan:  POD 3 Hemodynamically stable on midodrine 10 tid. Will continue that today and start weaning tomorrow as BP allows. EF normal.  Rhythm this am appears to be 2nd degree type 1 block with vent rate 70's. Will change pacer to VVI 50 and observe. Consult EP in case pacer needed. No beta blockers.  Volume excess: -1924 cc yesterday and wt down 3+ lbs. Still 5.5 lbs over preop. Continue diuresis today.  Right lung air space disease on CXR yesterday improved today. I suspect this was atelectasis/edema but started on empiric Maxipime given significant lung disease and possible preop pneumonia/ CHF/ COPD exacerbation hx that was treated before surgery, delaying surgery a couple weeks. Remains afebrile with normal WBC ct.  Severe diffusion capacity defect on preop PFT's. Still has significant oxygen requirement. Continue IS, diuresis.  DC central line.  Urinary retention: if he can't urinate this am insert foley and leave in for a few days. Start Flomax this am.  Keep in ICU today due to oxygen requirement.       LOS: 3 days    Alleen Borne 11/07/2022

## 2022-11-08 LAB — BASIC METABOLIC PANEL
Anion gap: 6 (ref 5–15)
BUN: 21 mg/dL (ref 8–23)
CO2: 27 mmol/L (ref 22–32)
Calcium: 7.7 mg/dL — ABNORMAL LOW (ref 8.9–10.3)
Chloride: 101 mmol/L (ref 98–111)
Creatinine, Ser: 1.01 mg/dL (ref 0.61–1.24)
GFR, Estimated: >60 mL/min (ref >60)
Glucose, Bld: 92 mg/dL (ref 70–99)
Potassium: 4.1 mmol/L (ref 3.5–5.1)
Sodium: 134 mmol/L — ABNORMAL LOW (ref 135–145)

## 2022-11-08 LAB — BPAM RBC
Blood Product Expiration Date: 202406272359
Blood Product Expiration Date: 202406272359
Blood Product Expiration Date: 202406272359
ISSUE DATE / TIME: 202406040726
Unit Type and Rh: 6200

## 2022-11-08 LAB — TYPE AND SCREEN: Unit division: 0

## 2022-11-08 MED ORDER — CHLORHEXIDINE GLUCONATE CLOTH 2 % EX PADS
6.0000 | MEDICATED_PAD | Freq: Every day | CUTANEOUS | Status: DC
  Administered 2022-11-08 – 2022-11-09 (×2): 6 via TOPICAL

## 2022-11-08 MED ORDER — DOCUSATE SODIUM 100 MG PO CAPS
200.0000 mg | ORAL_CAPSULE | Freq: Every day | ORAL | Status: DC
  Administered 2022-11-09 – 2022-11-12 (×4): 200 mg via ORAL
  Filled 2022-11-08 (×5): qty 2

## 2022-11-08 MED ORDER — SODIUM CHLORIDE 0.9% FLUSH
3.0000 mL | Freq: Two times a day (BID) | INTRAVENOUS | Status: DC
  Administered 2022-11-08 – 2022-11-13 (×9): 3 mL via INTRAVENOUS

## 2022-11-08 MED ORDER — SODIUM CHLORIDE 0.9% FLUSH: 3.0000 mL | INTRAVENOUS | Status: DC | PRN

## 2022-11-08 MED ORDER — SODIUM CHLORIDE 0.9 % IV SOLN
250.0000 mL | INTRAVENOUS | Status: DC | PRN
  Administered 2022-11-08: 250 mL via INTRAVENOUS

## 2022-11-08 MED ORDER — ~~LOC~~ CARDIAC SURGERY, PATIENT & FAMILY EDUCATION: Freq: Once | Status: AC

## 2022-11-08 MED FILL — Sodium Chloride IV Soln 0.9%: INTRAVENOUS | Qty: 3000 | Status: AC

## 2022-11-08 MED FILL — Mannitol IV Soln 20%: INTRAVENOUS | Qty: 500 | Status: AC

## 2022-11-08 MED FILL — Heparin Sodium (Porcine) Inj 1000 Unit/ML: INTRAMUSCULAR | Qty: 10 | Status: AC

## 2022-11-08 MED FILL — Sodium Bicarbonate IV Soln 8.4%: INTRAVENOUS | Qty: 50 | Status: AC

## 2022-11-08 MED FILL — Electrolyte-R (PH 7.4) Solution: INTRAVENOUS | Qty: 3000 | Status: AC

## 2022-11-08 NOTE — Progress Notes (Signed)
Patient Name: Christopher Arias Date of Encounter: 11/08/2022  Primary Cardiologist: Christopher Ishikawa, MD Electrophysiologist: New  Interval Summary   NAEO   The patient is doing well today.  At this time, the patient denies chest pain, shortness of breath, or any new concerns.  Inpatient Medications    Scheduled Meds:  sodium chloride   Intravenous Once   acetaminophen  1,000 mg Oral Q6H   Or   acetaminophen (TYLENOL) oral liquid 160 mg/5 mL  1,000 mg Per Tube Q6H   aspirin EC  81 mg Oral Daily   atorvastatin  20 mg Oral Daily   bisacodyl  10 mg Oral Daily   Or   bisacodyl  10 mg Rectal Daily   budesonide (PULMICORT) nebulizer solution  0.5 mg Nebulization BID   Chlorhexidine Gluconate Cloth  6 each Topical Daily   docusate sodium  200 mg Oral Daily   Fe Fum-Vit C-Vit B12-FA  1 capsule Oral QPC breakfast   fluticasone furoate-vilanterol  1 puff Inhalation Daily   levothyroxine  100 mcg Oral Daily   midodrine  10 mg Oral TID WC   pantoprazole  40 mg Oral Daily   sertraline  200 mg Oral QHS   sodium chloride flush  3 mL Intravenous Q12H   tamsulosin  0.4 mg Oral Daily   umeclidinium bromide  1 puff Inhalation Daily   varenicline  1 mg Oral Daily   Continuous Infusions:  sodium chloride 10 mL/hr at 11/07/22 1300   sodium chloride Stopped (11/07/22 0552)   ceFEPime (MAXIPIME) IV Stopped (11/08/22 0528)   lactated ringers     PRN Meds: sodium chloride, albuterol, clonazePAM, ketorolac, morphine injection, ondansetron (ZOFRAN) IV, oxyCODONE, sodium chloride flush, traMADol   Vital Signs    Vitals:   11/08/22 0300 11/08/22 0400 11/08/22 0500 11/08/22 0604  BP: 96/69 94/63 109/84 (!) 100/57  Pulse: 100 96 98 (!) 166  Resp: (!) 22 19 15  (!) 24  Temp:   98.3 F (36.8 C)   TempSrc:   Oral   SpO2: (!) 84% 97% (!) 86% 92%  Weight:   75.4 kg   Height:        Intake/Output Summary (Last 24 hours) at 11/08/2022 0815 Last data filed at 11/08/2022 0600 Gross per 24 hour   Intake 303.04 ml  Output 1670 ml  Net -1366.96 ml   Filed Weights   11/06/22 0500 11/07/22 0500 11/08/22 0500  Weight: 82.3 kg 80.3 kg 75.4 kg    Physical Exam    GEN- NAD, alert and oriented x 3 today.   Lungs- Diminished, normal work of breathing on O2. Cardiac- Regular rate and rhythm, no murmurs, rubs or gallops GI- soft, NT, ND, + BS Extremities- no clubbing or cyanosis. No edema  Telemetry    Sinus tach overnight, this am with 2nd degree AV block vs CHB with junctional escape (personally reviewed)  Hospital Course    Christopher Arias is a 62 y.o. male with a history of HLD, Hypothyroidism, previous COPD on Home O2, mild OSA not on CPAP, 1st degree AV block, and Bicuspid AV with severe AI who is being seen today for the evaluation of AV block s/p AVR at the request of Dr. Laneta Arias.   Assessment & Plan    Advanced AV block s/p TAVR 1st degree AV block at baseline Periods of sinus tach now intermittently in Second degree AV block vs CHB with junctional rhythm  EP will follow along in case pacing is  needed. Will watch through the weekend, and if needed, would plan pacer closer to discharge and once respiratory status has improved.   Hopefully his conduction will continue to improve.    S/p TAVR Otherwise stable course.  Chest tubes out 6/5  Acute on chronic hypoxic resp failure COPD On 2-3L pre-op.  Continue IS He is on maxipime for possible HCAP.    For questions or updates, please contact [facility] Please consult [email or web address] for contact info under Cardiology/STEMI.  Signed, Christopher Freer, PA-C  11/08/2022, 8:15 AM

## 2022-11-08 NOTE — Progress Notes (Signed)
4 Days Post-Op Procedure(s) (LRB): EXPLORATION POST OPERATIVE OPEN HEART (N/A) Subjective: No complaints  Objective: Vital signs in last 24 hours: Temp:  [97.6 F (36.4 C)-98.5 F (36.9 C)] 98.3 F (36.8 C) (06/07 0500) Pulse Rate:  [50-166] 166 (06/07 0604) Cardiac Rhythm: Heart block;Normal sinus rhythm (06/07 0238) Resp:  [12-24] 24 (06/07 0604) BP: (79-111)/(45-84) 100/57 (06/07 0604) SpO2:  [84 %-100 %] 92 % (06/07 0604) FiO2 (%):  [40 %-50 %] 40 % (06/06 1551) Weight:  [75.4 kg] 75.4 kg (06/07 0500)  Hemodynamic parameters for last 24 hours:    Intake/Output from previous day: 06/06 0701 - 06/07 0700 In: 303 [I.V.:3; IV Piggyback:300] Out: 1770 [Urine:1770] Intake/Output this shift: No intake/output data recorded.  General appearance: alert and cooperative Neurologic: intact Heart: regular rate and rhythm, S1, S2 normal, no murmur Lungs: clear to auscultation bilaterally Extremities: no edema Wound: incision healing well  Lab Results: Recent Labs    11/06/22 0500 11/07/22 0436  WBC 9.8 7.2  HGB 8.9* 8.5*  HCT 26.0* 25.0*  PLT 86* 94*   BMET:  Recent Labs    11/07/22 0436 11/08/22 0227  NA 133* 134*  K 4.0 4.1  CL 101 101  CO2 26 27  GLUCOSE 92 92  BUN 22 21  CREATININE 0.86 1.01  CALCIUM 7.5* 7.7*    PT/INR: No results for input(s): "LABPROT", "INR" in the last 72 hours. ABG    Component Value Date/Time   PHART 7.324 (L) 11/04/2022 2300   HCO3 22.1 11/04/2022 2300   TCO2 23 11/04/2022 2300   ACIDBASEDEF 4.0 (H) 11/04/2022 2300   O2SAT 97 11/04/2022 2300   CBG (last 3)  Recent Labs    11/06/22 1217 11/06/22 1558 11/06/22 2026  GLUCAP 118* 115* 116*    Assessment/Plan:  POD 4  Hemodynamically stable on midodrine 10 tid. Wean slowly as tolerated.  Rhythm this am looks like mobitz type 2 AV block with ventricular rate 55. Continue backup VVI pacer at 50 and observe. EP is following. No beta blockers.  Wt is below preop if  accurate. No further diuresis needed.  Urinary retention: Flomax started yesterday after foley reinsertion. Keep foley in for a couple days to let things quiet down.  COPD and severe diffusion defect on PFT's with oxygen dependence at home on 2-3 L. Oxygen requirement is improving postop. Continue IS, bronchodilators. He is on Maxipime for suspected HCAP although right lung air space disease resolved quickly and could have been atelectasis or edema. Will continue antibiotic for 5 day course with his degree of lung disease.  Transfer to 4E. Continue mobilization.     LOS: 4 days    Christopher Arias 11/08/2022

## 2022-11-09 ENCOUNTER — Inpatient Hospital Stay (HOSPITAL_COMMUNITY): Payer: BC Managed Care – PPO

## 2022-11-09 LAB — CBC
HCT: 23.9 % — ABNORMAL LOW (ref 39.0–52.0)
Hemoglobin: 7.9 g/dL — ABNORMAL LOW (ref 13.0–17.0)
MCH: 30.9 pg (ref 26.0–34.0)
MCHC: 33.1 g/dL (ref 30.0–36.0)
MCV: 93.4 fL (ref 80.0–100.0)
Platelets: 112 10*3/uL — ABNORMAL LOW (ref 150–400)
RBC: 2.56 MIL/uL — ABNORMAL LOW (ref 4.22–5.81)
RDW: 14.6 % (ref 11.5–15.5)
WBC: 6.5 10*3/uL (ref 4.0–10.5)
nRBC: 0.3 % — ABNORMAL HIGH (ref 0.0–0.2)

## 2022-11-09 LAB — BASIC METABOLIC PANEL
Anion gap: 10 (ref 5–15)
BUN: 21 mg/dL (ref 8–23)
CO2: 25 mmol/L (ref 22–32)
Calcium: 7.9 mg/dL — ABNORMAL LOW (ref 8.9–10.3)
Chloride: 100 mmol/L (ref 98–111)
Creatinine, Ser: 0.95 mg/dL (ref 0.61–1.24)
GFR, Estimated: >60 mL/min (ref >60)
Glucose, Bld: 95 mg/dL (ref 70–99)
Potassium: 4.1 mmol/L (ref 3.5–5.1)
Sodium: 135 mmol/L (ref 135–145)

## 2022-11-09 NOTE — Plan of Care (Signed)

## 2022-11-09 NOTE — Progress Notes (Signed)
Pt sleeping in bed, HR 98-101 ST  Christopher Arias 11/09/2022 10:19 AM

## 2022-11-09 NOTE — Progress Notes (Signed)
Pharmacy Antibiotic Note  Christopher Arias is a 62 y.o. male admitted on 11/04/2022 with pneumonia.  Pharmacy has been consulted for cefepime dosing.WBC within normal limits. Afebrile. SCr 0.95 and at baseline of Scr<1. CrCl 77.5 ml/min.  Plan: Cefepime 2g IV every 8 hours.  Monitor renal function, clinical status, and culture results.   Height: 5\' 5"  (165.1 cm) Weight: 75.4 kg (166 lb 3.6 oz) IBW/kg (Calculated) : 61.5  Temp (24hrs), Avg:98.1 F (36.7 C), Min:97.4 F (36.3 C), Max:98.3 F (36.8 C)  Recent Labs  Lab 11/05/22 0419 11/05/22 1633 11/06/22 0500 11/07/22 0436 11/08/22 0227 11/09/22 0139  WBC 8.3 9.3 9.8 7.2  --  6.5  CREATININE 0.82 1.12 0.80 0.86 1.01 0.95    Estimated Creatinine Clearance: 77.5 mL/min (by C-G formula based on SCr of 0.95 mg/dL).    Allergies  Allergen Reactions   Atenolol Other (See Comments)    Nose bleeds    Antimicrobials this admission: Cefepime 6/5 >>   Thank you for allowing pharmacy to be a part of this patient's care.  Georga Hacking 11/09/2022 1:41 PM

## 2022-11-09 NOTE — Progress Notes (Signed)
Pt declined walk, wanted to rest. Pt very interested in CRP2. Gave materials, [facility].  Referral placed to: [facility] under cardiologist Dr. [name] Qualifying Dx: 6/3 AV replacement

## 2022-11-09 NOTE — Progress Notes (Addendum)
      [address]       [place] [address]             [phone]        5 Days Post-Op Procedure(s) (LRB): EXPLORATION POST OPERATIVE OPEN HEART (N/A)  Subjective: Patient without specific complaint this am  Objective: Vital signs in last 24 hours: Temp:  [97.4 F (36.3 C)-98.3 F (36.8 C)] 98.1 F (36.7 C) (06/08 0347) Pulse Rate:  [54-111] 102 (06/08 0347) Cardiac Rhythm: Heart block (06/07 2305) Resp:  [13-20] 20 (06/08 0347) BP: (95-119)/(58-86) 119/81 (06/08 0347) SpO2:  [92 %-98 %] 98 % (06/08 0347)  Pre op weight 77.8 kg Current Weight  11/08/22 75.4 kg      Intake/Output from previous day: 06/07 0701 - 06/08 0700 In: 100 [IV Piggyback:100] Out: 600 [Urine:600]   Physical Exam:  Cardiovascular: Paced Pulmonary: Clear to auscultation bilaterally; Abdomen: Soft, non tender, bowel sounds present. Extremities: No lower extremity edema. Wounds: Clean and dry.  No erythema or signs of infection.  Lab Results: CBC: Recent Labs    11/07/22 0436 11/09/22 0139  WBC 7.2 6.5  HGB 8.5* 7.9*  HCT 25.0* 23.9*  PLT 94* 112*   BMET:  Recent Labs    11/08/22 0227 11/09/22 0139  NA 134* 135  K 4.1 4.1  CL 101 100  CO2 27 25  GLUCOSE 92 95  BUN 21 21  CREATININE 1.01 0.95  CALCIUM 7.7* 7.9*    PT/INR:  Lab Results  Component Value Date   INR 1.6 (H) 11/04/2022   INR 1.6 (H) 11/04/2022   INR 1.1 10/31/2022   ABG:  INR: Will add last result for INR, ABG once components are confirmed Will add last 4 CBG results once components are confirmed  Assessment/Plan:  1. CV - ST and intermittently in second degree AV block with junctional rhythm. Pacing this am. He is NOT on a BB. EP following. If conduction does not improve, will need PPM. On Midodrine 10 mg tid;will wean as able. 2.  Pulmonary - History of COPD (on 3 L of oxygen via [place] prior to surgery). On 5 liters of oxygen via [place]. Continue Pulmicort, Incruse Ellipta and Breo  Ellipta. Wean as able. PA/LAT CXR appears stable (mall b/l pleural effusions). Encourage incentive spirometer. 3.  ID-on Maxipime for suspected HCAP 4.  Expected post op acute blood loss anemia - H and H this am decreased to 7.9 and 23.9. Continue Trigels Forte 5. Thrombocytopenia-platelets this am up to 112,000 6. History of hypothyroidism-continue Levothyroxine 100 mcg daily 7. Urinary retention-continue Flomax. Voiding trial soon.  Donielle M ZimmermanPA-C 7:42 AM   Agree with above Watching rhythm Pulm hygiene  Brelan Hannen Keane Scrape

## 2022-11-09 NOTE — Progress Notes (Signed)
Patient Name: Christopher Arias Date of Encounter: 11/09/2022  Primary Cardiologist: Little Ishikawa, MD Electrophysiologist: New  Interval Summary   Feeling well without complaint  Inpatient Medications    Scheduled Meds:  acetaminophen  1,000 mg Oral Q6H   Or   acetaminophen (TYLENOL) oral liquid 160 mg/5 mL  1,000 mg Per Tube Q6H   aspirin EC  81 mg Oral Daily   atorvastatin  20 mg Oral Daily   budesonide (PULMICORT) nebulizer solution  0.5 mg Nebulization BID   Chlorhexidine Gluconate Cloth  6 each Topical Daily   docusate sodium  200 mg Oral Daily   Fe Fum-Vit C-Vit B12-FA  1 capsule Oral QPC breakfast   fluticasone furoate-vilanterol  1 puff Inhalation Daily   levothyroxine  100 mcg Oral Daily   midodrine  10 mg Oral TID WC   pantoprazole  40 mg Oral Daily   sertraline  200 mg Oral QHS   sodium chloride flush  3 mL Intravenous Q12H   tamsulosin  0.4 mg Oral Daily   umeclidinium bromide  1 puff Inhalation Daily   varenicline  1 mg Oral Daily   Continuous Infusions:  sodium chloride 250 mL (11/08/22 2056)   ceFEPime (MAXIPIME) IV 2 g (11/09/22 0402)   PRN Meds: sodium chloride, albuterol, clonazePAM, ketorolac, ondansetron (ZOFRAN) IV, oxyCODONE, sodium chloride flush, traMADol   Vital Signs    Vitals:   11/09/22 0347 11/09/22 0811 11/09/22 0819 11/09/22 0820  BP: 119/81 108/61    Pulse: (!) 102 (!) 52 (!) 52   Resp: 20 16    Temp: 98.1 F (36.7 C) 98.1 F (36.7 C)    TempSrc: Oral Oral    SpO2: 98% 95% 90% 90%  Weight:      Height:        Intake/Output Summary (Last 24 hours) at 11/09/2022 1610 Last data filed at 11/09/2022 0546 Gross per 24 hour  Intake 100 ml  Output 600 ml  Net -500 ml    Filed Weights   11/06/22 0500 11/07/22 0500 11/08/22 0500  Weight: 82.3 kg 80.3 kg 75.4 kg    Physical Exam    GEN: Well nourished, well developed, in no acute distress  HEENT: normal  Neck: no JVD, carotid bruits, or masses Cardiac: tachycardic; no  murmurs, rubs, or gallops,no edema  Respiratory:  clear to auscultation bilaterally, normal work of breathing GI: soft, nontender, nondistended, + BS MS: no deformity or atrophy  Skin: warm and dry Neuro:  Strength and sensation are intact Psych: euthymic mood, full affect   Telemetry    Sinus tachycardia, intermittent sinus rhythm with ventricular pacing  Hospital Course    Christopher Arias is a 62 y.o. male with a history of HLD, Hypothyroidism, previous COPD on Home O2, mild OSA not on CPAP, 1st degree AV block, and Bicuspid AV with severe AI who is being seen today for the evaluation of AV block s/p AVR at the request of Dr. Laneta Simmers.   Assessment & Plan    1.  Advanced heart block post AVR: First-degree AV block at baseline.  Has had some atrial arrhythmia with intact conduction, but has also had pacing throughout the day yesterday.  Christopher Arias continue to monitor throughout the weekend.  Christopher Arias make him n.p.o. after midnight Sunday night for further evaluation for pacemaker potentially on Monday.  2.  Post AVR: Stable.  Plan per surgical team.  3.  Acute hypoxic respiratory failure/COPD: Currently on 2 to 3 L preop.  Plan per primary team.   For questions or updates, please contact [facility] Please consult [email or web address] for contact info under Cardiology/STEMI.  Signed, Susie Pousson Jorja Loa, MD  11/09/2022, 9:24 AM

## 2022-11-10 MED ORDER — FLUTICASONE PROPIONATE 50 MCG/ACT NA SUSP
2.0000 | Freq: Every day | NASAL | Status: DC
  Administered 2022-11-10 – 2022-11-14 (×5): 2 via NASAL
  Filled 2022-11-10: qty 16

## 2022-11-10 MED ORDER — FLEET ENEMA 7-19 GM/118ML RE ENEM
1.0000 | ENEMA | Freq: Once | RECTAL | Status: AC
  Administered 2022-11-10: 1 via RECTAL
  Filled 2022-11-10: qty 1

## 2022-11-10 MED ORDER — SALINE SPRAY 0.65 % NA SOLN
1.0000 | NASAL | Status: DC | PRN
  Filled 2022-11-10: qty 44

## 2022-11-10 MED ORDER — SORBITOL 70 % SOLN
30.0000 mL | Freq: Once | Status: AC
  Administered 2022-11-10: 30 mL via ORAL
  Filled 2022-11-10: qty 30

## 2022-11-10 NOTE — Progress Notes (Addendum)
      [address]       [place] [address]             [phone]        6 Days Post-Op Procedure(s) (LRB): EXPLORATION POST OPERATIVE OPEN HEART (N/A)  Subjective: Patient states oxygen dries out his nose and not uncommon for nose bleeding. He has constipation.  Objective: Vital signs in last 24 hours: Temp:  [97.9 F (36.6 C)-98.1 F (36.7 C)] 98.1 F (36.7 C) (06/09 0500) Pulse Rate:  [50] 50 (06/09 0500) Cardiac Rhythm: Ventricular paced (06/08 2015) Resp:  [18-22] 22 (06/09 0500) BP: (115-130)/(56-85) 115/70 (06/09 0500) SpO2:  [90 %-98 %] 96 % (06/09 0738) Weight:  [79 kg] 79 kg (06/09 0527)  Pre op weight 77.8 kg Current Weight  11/10/22 79 kg      Intake/Output from previous day: 06/08 0701 - 06/09 0700 In: 323.3 [I.V.:23.3; IV Piggyback:300] Out: 700 [Urine:700]   Physical Exam:  Cardiovascular: Bradycardia (HR low 50's) Pulmonary: Clear to auscultation bilaterally; Abdomen: Soft, non tender, bowel sounds present. Extremities: No lower extremity edema. Wounds: Clean and dry.  No erythema or signs of infection.  Lab Results: CBC: Recent Labs    11/09/22 0139  WBC 6.5  HGB 7.9*  HCT 23.9*  PLT 112*    BMET:  Recent Labs    11/08/22 0227 11/09/22 0139  NA 134* 135  K 4.1 4.1  CL 101 100  CO2 27 25  GLUCOSE 92 95  BUN 21 21  CREATININE 1.01 0.95  CALCIUM 7.7* 7.9*     PT/INR:  Lab Results  Component Value Date   INR 1.6 (H) 11/04/2022   INR 1.6 (H) 11/04/2022   INR 1.1 10/31/2022   ABG:  INR: Will add last result for INR, ABG once components are confirmed Will add last 4 CBG results once components are confirmed  Assessment/Plan:  1. CV - Advanced heart block. Pacing overnight. His external pacer is set at 40. He is NOT on a BB. EP following. If conduction does not improve, will need PPM in am. On Midodrine 10 mg tid;will wean as able. 2.  Pulmonary - History of COPD (on 3 L of oxygen via [place] prior to  surgery). On 5 liters of oxygen via [place]. Wean as able. Flonase ordered. Continue Pulmicort, Incruse Ellipta and Breo Ellipta. Encourage incentive spirometer. 3.  ID-on Maxipime for suspected HCAP. Will likely stop soon 4.  Expected post op acute blood loss anemia - Last H and H decreased to 7.9 and 23.9. Continue Trigels Forte. Re check in am. 5. Thrombocytopenia-Last platelets up to 112,000 6. History of hypothyroidism-continue Levothyroxine 100 mcg daily 7. Urinary retention-continue Flomax. Voiding trial today (as discussed with Dr. Cliffton Asters). Bladder scan Q 6 hours x 3. 8. LOC constipation  Donielle M ZimmermanPA-C 8:51 AM   Agree with above Paced overnight EP on board  Jene Oravec O Cabot [place]

## 2022-11-10 NOTE — Progress Notes (Signed)
  Patient Name: Christopher Arias Date of Encounter: 11/10/2022  Primary Cardiologist: Little Ishikawa, MD Electrophysiologist: New  Interval Summary   Feeling well without complaint  Inpatient Medications    Scheduled Meds:  aspirin EC  81 mg Oral Daily   atorvastatin  20 mg Oral Daily   budesonide (PULMICORT) nebulizer solution  0.5 mg Nebulization BID   Chlorhexidine Gluconate Cloth  6 each Topical Daily   docusate sodium  200 mg Oral Daily   Fe Fum-Vit C-Vit B12-FA  1 capsule Oral QPC breakfast   fluticasone furoate-vilanterol  1 puff Inhalation Daily   levothyroxine  100 mcg Oral Daily   midodrine  10 mg Oral TID WC   pantoprazole  40 mg Oral Daily   sertraline  200 mg Oral QHS   sodium chloride flush  3 mL Intravenous Q12H   tamsulosin  0.4 mg Oral Daily   umeclidinium bromide  1 puff Inhalation Daily   varenicline  1 mg Oral Daily   Continuous Infusions:  sodium chloride Stopped (11/08/22 2348)   ceFEPime (MAXIPIME) IV 2 g (11/10/22 0557)   PRN Meds: sodium chloride, albuterol, clonazePAM, ketorolac, ondansetron (ZOFRAN) IV, oxyCODONE, sodium chloride flush, traMADol   Vital Signs    Vitals:   11/09/22 2030 11/10/22 0030 11/10/22 0500 11/10/22 0527  BP: (!) 116/56 127/85 115/70   Pulse: (!) 50  (!) 50   Resp: 20 18 (!) 22   Temp: 97.9 F (36.6 C)  98.1 F (36.7 C)   TempSrc: Oral  Oral   SpO2: 94% 98% 96%   Weight:    79 kg  Height:        Intake/Output Summary (Last 24 hours) at 11/10/2022 0720 Last data filed at 11/10/2022 0981 Gross per 24 hour  Intake 323.25 ml  Output 700 ml  Net -376.75 ml    Filed Weights   11/07/22 0500 11/08/22 0500 11/10/22 0527  Weight: 80.3 kg 75.4 kg 79 kg    Physical Exam    GEN: Well nourished, well developed, in no acute distress  HEENT: normal  Neck: no JVD, carotid bruits, or masses Cardiac: tachycardic, regular; no murmurs, rubs, or gallops,no edema  Respiratory:  clear to auscultation bilaterally, normal  work of breathing GI: soft, nontender, nondistended, + BS MS: no deformity or atrophy  Skin: warm and dry Neuro:  Strength and sensation are intact Psych: euthymic mood, full affect    Telemetry    Sinus versus atrial tachycardia, ventricular pacing overnight Hospital Course    Christopher Arias is a 62 y.o. male with a history of HLD, Hypothyroidism, previous COPD on Home O2, mild OSA not on CPAP, 1st degree AV block, and Bicuspid AV with severe AI who is being seen today for the evaluation of AV block s/p AVR at the request of Dr. Laneta Simmers.   Assessment & Plan    1.  Advanced heart block post AVR: Has first-degree block at baseline.  Did have pacing overnight.  He is in an atrial arrhythmia today.  Afternoon pacing rate overnight down to 40.  If he paces overnight tonight, Ahnesty Finfrock likely need pacemaker implant.  2.  Post AVR: Postsurgical team  3.  Acute hypoxic respiratory failure/COPD: Plan per primary team    For questions or updates, please contact [facility] Please consult [email or web address] for contact info under Cardiology/STEMI.  Signed, Yobany Vroom Jorja Loa, MD  11/10/2022, 7:20 AM

## 2022-11-10 NOTE — Progress Notes (Signed)
This nurse entered room to find patient agitated in bed. Patient reports SOB and "not getting any air." O2 on 5L HFNC 94%. Severe nasal congestion noted. Patient attempted to blow nose but unable to do so. Becoming anxious, increased WOB, unable to sit still. PRN Klonopin given (see eMAR). Saline syringe given to patient and nose irrigated. Patient able to blow nose and dislodge multiple large mucus plugs. Patient reports being able to breathe better, WOB decreased. O2 sats on room air 88%, patient placed back on HFNC at 4L/min. Patient sitting on edge of bed, call bell in reach.

## 2022-11-11 LAB — CBC
HCT: 25.7 % — ABNORMAL LOW (ref 39.0–52.0)
Hemoglobin: 8.3 g/dL — ABNORMAL LOW (ref 13.0–17.0)
MCH: 30.7 pg (ref 26.0–34.0)
MCHC: 32.3 g/dL (ref 30.0–36.0)
MCV: 95.2 fL (ref 80.0–100.0)
Platelets: 168 10*3/uL (ref 150–400)
RBC: 2.7 MIL/uL — ABNORMAL LOW (ref 4.22–5.81)
RDW: 14.8 % (ref 11.5–15.5)
WBC: 9.2 10*3/uL (ref 4.0–10.5)
nRBC: 0.4 % — ABNORMAL HIGH (ref 0.0–0.2)

## 2022-11-11 MED ORDER — CEFAZOLIN SODIUM-DEXTROSE 2-4 GM/100ML-% IV SOLN: 2.0000 g | INTRAVENOUS | Status: AC

## 2022-11-11 MED ORDER — MIDODRINE HCL 5 MG PO TABS
5.0000 mg | ORAL_TABLET | Freq: Three times a day (TID) | ORAL | Status: DC
  Administered 2022-11-11 – 2022-11-12 (×3): 5 mg via ORAL
  Filled 2022-11-11 (×3): qty 1

## 2022-11-11 MED ORDER — CHLORHEXIDINE GLUCONATE 4 % EX SOLN
60.0000 mL | Freq: Once | CUTANEOUS | Status: AC
  Administered 2022-11-12: 4 via TOPICAL

## 2022-11-11 MED ORDER — SODIUM CHLORIDE 0.9% FLUSH
3.0000 mL | Freq: Two times a day (BID) | INTRAVENOUS | Status: DC
  Administered 2022-11-11 – 2022-11-14 (×5): 3 mL via INTRAVENOUS

## 2022-11-11 MED ORDER — SODIUM CHLORIDE 0.9 % IV SOLN: 250.0000 mL | INTRAVENOUS | Status: DC

## 2022-11-11 MED ORDER — SODIUM CHLORIDE 0.9 % IV SOLN
80.0000 mg | INTRAVENOUS | Status: AC
  Administered 2022-11-12: 80 mg
  Filled 2022-11-11 (×2): qty 2

## 2022-11-11 MED ORDER — SODIUM CHLORIDE 0.9% FLUSH: 3.0000 mL | INTRAVENOUS | Status: DC | PRN

## 2022-11-11 MED ORDER — SODIUM CHLORIDE 0.9 % IV SOLN: INTRAVENOUS | Status: DC

## 2022-11-11 MED ORDER — CHLORHEXIDINE GLUCONATE 4 % EX SOLN
60.0000 mL | Freq: Once | CUTANEOUS | Status: AC
  Administered 2022-11-11: 4 via TOPICAL
  Filled 2022-11-11: qty 60

## 2022-11-11 NOTE — Progress Notes (Addendum)
CARDIAC REHAB PHASE I   PRE:  Rate/Rhythm: 114 ST    BP: sitting 118/92    SpO2: 82 RA, 93 4L  MODE:  Ambulation: 370 ft   POST:  Rate/Rhythm: 132 ST    BP: sitting 120/94     SpO2: 89-90 3L  Pt on EOB, eager to ambulate.   not in nose, SpO2 82 RA. Sts PTA he was not wearing O2 at rest but would need it "sometimes with exertion". Pt moving well, able to move hips to EOB and stand independently. Walked with RW, 3L, standby assist. Exerted with distance and talking. Standing rest x2 due to SOB, SpO2 89-90 3L, had pt practice pursed lip breathing. To recliner after walk, pt very thankful, stating previously staff was making him carry his equipment. Emphasized need for ambulation with RW and O2 with staff atleast x2 more today. Also encouraged IS, which pt is currently at 1750 ml. Will ask Mobility Team to assist in ambulation. No HB seen, only ST. Pt sts he has a rollator at home.  [id number]  Ethelda Chick BS, ACSM-CEP 11/11/2022 9:16 AM

## 2022-11-11 NOTE — Progress Notes (Signed)
Patient Name: Christopher Arias Date of Encounter: 11/11/2022  Primary Cardiologist: Little Ishikawa, MD Electrophysiologist: New  Interval Summary   Frustrated about his LOS, "nothing's being done", I feel worse then before, probably made a mistake in doing this, wants to go home  Inpatient Medications    Scheduled Meds:  aspirin EC  81 mg Oral Daily   atorvastatin  20 mg Oral Daily   budesonide (PULMICORT) nebulizer solution  0.5 mg Nebulization BID   docusate sodium  200 mg Oral Daily   Fe Fum-Vit C-Vit B12-FA  1 capsule Oral QPC breakfast   fluticasone  2 spray Each Nare Daily   fluticasone furoate-vilanterol  1 puff Inhalation Daily   levothyroxine  100 mcg Oral Daily   midodrine  10 mg Oral TID WC   pantoprazole  40 mg Oral Daily   sertraline  200 mg Oral QHS   sodium chloride flush  3 mL Intravenous Q12H   tamsulosin  0.4 mg Oral Daily   umeclidinium bromide  1 puff Inhalation Daily   varenicline  1 mg Oral Daily   Continuous Infusions:  sodium chloride Stopped (11/08/22 2348)   ceFEPime (MAXIPIME) IV 2 g (11/11/22 0328)   PRN Meds: sodium chloride, albuterol, clonazePAM, ondansetron (ZOFRAN) IV, oxyCODONE, sodium chloride, sodium chloride flush, traMADol   Vital Signs    Vitals:   11/10/22 1810 11/10/22 2020 11/10/22 2308 11/11/22 0449  BP:  120/85 138/87 128/84  Pulse: (!) 48 (!) 107 100 (!) 107  Resp: 15 17 16 20   Temp:   97.6 F (36.4 C) 98.3 F (36.8 C)  TempSrc:   Oral Oral  SpO2: 93%  94% 95%  Weight:    79 kg  Height:        Intake/Output Summary (Last 24 hours) at 11/11/2022 0737 Last data filed at 11/10/2022 0830 Gross per 24 hour  Intake 120 ml  Output --  Net 120 ml   Filed Weights   11/08/22 0500 11/10/22 0527 11/11/22 0449  Weight: 75.4 kg 79 kg 79 kg    Physical Exam    GEN: Well nourished, well developed, in no acute distress  HEENT: normal  Neck: no JVD, carotid bruits, or masses Cardiac: tachycardic, regular; no murmurs,  rubs, or gallops,no edema  Respiratory: no wheezing, normal work of breathing GI: soft, nontender, nondistended MS: no deformity or atrophy  Skin: warm and dry Neuro:  Strength and sensation are intact Psych: euthymic mood, full affect    Telemetry    Sinus versus atrial tachycardia, ventricular pacing overnight   CARDIOLOGY DATA:  11/04/22: Inter-op TEE POST-OP IMPRESSIONS  _ Left Ventricle: The left ventricle is unchanged from pre-bypass.  _ Right Ventricle: The right ventricle appears unchanged from pre-bypass.  _ Aortic Valve: A bioprosthetic valve was placed, leaflets are freely  mobile and  leaflets thin Size; 27mm. No regurgitation post repair. The gradient  recorded  across the prosthetic valve is within the expected range.  _ Mitral Valve: The mitral valve appears unchanged from pre-bypass.  _ Tricuspid Valve: The tricuspid valve appears unchanged from pre-bypass.  _ Pulmonic Valve: The pulmonic valve appears unchanged from pre-bypass.   PRE-OP FINDINGS   Left Ventricle: The left ventricle has normal systolic function, with an  ejection fraction of 55-60%. The cavity size was normal. There is no  increase in left ventricular wall thickness. No evidence of left  ventricular regional wall motion  abnormalities. There is no left ventricular hypertrophy.   Right Ventricle:  The right ventricle has normal systolic function. The  cavity was normal. There is no increase in right ventricular wall  thickness.   Left Atrium: Left atrial size was not assessed. No left atrial/left atrial  appendage thrombus was detected.   Right Atrium: Right atrial size was not assessed.   Interatrial Septum: No atrial level shunt detected by color flow Doppler.   Pericardium: There is no evidence of pericardial effusion.   Mitral Valve: The mitral valve is normal in structure. Mitral valve  regurgitation is not visualized by color flow Doppler.   Tricuspid Valve: The tricuspid valve was  normal in structure. Tricuspid  valve regurgitation was not visualized by color flow Doppler.   Aortic Valve: The aortic valve is bicuspid Aortic valve regurgitation is  moderate by color flow Doppler. The jet is eccentric anteriorly directed.    Pulmonic Valve: The pulmonic valve was normal in structure.  Pulmonic valve regurgitation is trivial by color flow Doppler.     08/01/22: TEE 1. Left ventricular ejection fraction, by estimation, is 60 to 65%. The  left ventricle has normal function. The left ventricular internal cavity  size was mildly dilated. Left ventricular diastolic function could not be  evaluated.   2. Right ventricular systolic function is normal. The right ventricular  size is normal.   3. Left atrial size was mildly dilated. No left atrial/left atrial  appendage thrombus was detected.   4. The mitral valve is normal in structure. No evidence of mitral valve  regurgitation.   5. Bicuspid Sievers type 1 valve with severe eccentric AR PISA radius 1  cm, holodiastolic reversals in ascending thoracic aorta, ERO 0.52 cm 2,  and Regurgitant volume 67 cc. The aortic valve is bicuspid. Aortic valve  regurgitation is severe.   6. Aortic dilatation noted. There is moderate dilatation of the aortic  root, measuring 40 mm.    Hospital Course    Christopher Arias is a 62 y.o. male with a history of HLD, Hypothyroidism, previous COPD on Home O2, mild OSA not on CPAP, 1st degree AV block, and Bicuspid AV with severe AI who is being seen today for the evaluation of AV block s/p AVR at the request of Dr. Laneta Simmers.   Assessment & Plan    1.  Advanced heart block post AVR:  He has had CHB w/V pacing and now in what appears an AT/ST 110's  POD #7 today I think he will need pacing  Will look to tomorrow, NPO after MN tonight   2.  Post AVR: (bioprosthetic) POD #7 today   3.  Acute/chronic hypoxic respiratory failure/COPD:  Home O2 is 3L Remains on 5L  On cefepime for suspect  HCAP   For questions or updates, please contact [facility] Please consult [email or web address] for contact info under Cardiology/STEMI.  Signed, Sheilah Pigeon, PA-C  11/11/2022, 7:37 AM

## 2022-11-11 NOTE — Progress Notes (Addendum)
[address]       [place] [address]             [phone]     7 Days Post-Op Procedure(s) (LRB): EXPLORATION POST OPERATIVE OPEN HEART (N/A) Subjective: Being considered by EP for Pacer  Objective: Vital signs in last 24 hours: Temp:  [97.6 F (36.4 C)-98.3 F (36.8 C)] 98.3 F (36.8 C) (06/10 0449) Pulse Rate:  [48-107] 107 (06/10 0449) Cardiac Rhythm: Heart block;Bundle branch block (06/09 1920) Resp:  [12-20] 20 (06/10 0449) BP: (107-138)/(61-87) 128/84 (06/10 0449) SpO2:  [91 %-96 %] 95 % (06/10 0449) Weight:  [79 kg] 79 kg (06/10 0449)  Hemodynamic parameters for last 24 hours:    Intake/Output from previous day: 06/09 0701 - 06/10 0700 In: 120 [P.O.:120] Out: -  Intake/Output this shift: No intake/output data recorded.  General appearance: alert, cooperative, and anxious Heart: regular rate and rhythm and tachy Lungs: some upper airway ronchi Abdomen: benign Extremities: minor LE edema Wound: some echymosis, healing without signs of infection  Lab Results: Recent Labs    11/09/22 0139 11/11/22 0127  WBC 6.5 9.2  HGB 7.9* 8.3*  HCT 23.9* 25.7*  PLT 112* 168   BMET:  Recent Labs    11/09/22 0139  NA 135  K 4.1  CL 100  CO2 25  GLUCOSE 95  BUN 21  CREATININE 0.95  CALCIUM 7.9*    PT/INR: No results for input(s): "LABPROT", "INR" in the last 72 hours. ABG    Component Value Date/Time   PHART 7.324 (L) 11/04/2022 2300   HCO3 22.1 11/04/2022 2300   TCO2 23 11/04/2022 2300   ACIDBASEDEF 4.0 (H) 11/04/2022 2300   O2SAT 97 11/04/2022 2300   CBG (last 3)  No results for input(s): "GLUCAP" in the last 72 hours.  Meds Scheduled Meds:  aspirin EC  81 mg Oral Daily   atorvastatin  20 mg Oral Daily   budesonide (PULMICORT) nebulizer solution  0.5 mg Nebulization BID   docusate sodium  200 mg Oral Daily   Fe Fum-Vit C-Vit B12-FA  1 capsule Oral QPC breakfast   fluticasone  2 spray Each Nare Daily   fluticasone  furoate-vilanterol  1 puff Inhalation Daily   levothyroxine  100 mcg Oral Daily   midodrine  10 mg Oral TID WC   pantoprazole  40 mg Oral Daily   sertraline  200 mg Oral QHS   sodium chloride flush  3 mL Intravenous Q12H   tamsulosin  0.4 mg Oral Daily   umeclidinium bromide  1 puff Inhalation Daily   varenicline  1 mg Oral Daily   Continuous Infusions:  sodium chloride Stopped (11/08/22 2348)   ceFEPime (MAXIPIME) IV 2 g (11/11/22 0328)   PRN Meds:.sodium chloride, albuterol, clonazePAM, ondansetron (ZOFRAN) IV, oxyCODONE, sodium chloride, sodium chloride flush, traMADol  Xrays No results found.  Assessment/Plan: S/P Procedure(s) (LRB): EXPLORATION POST OPERATIVE OPEN HEART (N/A) POD#7  1 afeb, VSS in HB intermittently, tachy at times to 120-130's- EP assisting with management, to decide on pacer 2 O2 sats good on 5 liters- conts nebs, pulm hygiene, on maxipime for poss CAP, h/o COPD 3 one voiding  recorded,but says he has voided 4 times since foley removed,  weight stable at 79 kg 4 no leukocytosis 5 expected ABLA - stable , on Iron combination supplement 6 thrombocytopenia - resolved 7 push rehab as able     LOS: 7 days    Goldenrod E  [place] Pager [phone] 11/11/2022   Chart reviewed, patient examined, agree with above. He feels much better this afternoon.  Rhythm has been sinus tachy, atrial tachy and some mobitz type 1, CHB with ventricular pacing. EP is following and plans pacer possibly tomorrow.  Will stop antibiotic since he has had 5 days. I doubt he had pneumonia since CXR changes resolved overnight. Most likely atelectasis.

## 2022-11-11 NOTE — Plan of Care (Signed)
  Problem: Pain Managment: Goal: General experience of comfort will improve Outcome: Progressing   Problem: Safety: Goal: Ability to remain free from injury will improve Outcome: Progressing   Problem: Skin Integrity: Goal: Risk for impaired skin integrity will decrease Outcome: Progressing   

## 2022-11-12 ENCOUNTER — Encounter (HOSPITAL_COMMUNITY)
Admission: RE | Disposition: A | Discharge status: Home / Self Care | Payer: Self-pay | Source: Home / Self Care | Attending: Surgery

## 2022-11-12 ENCOUNTER — Other Ambulatory Visit: Payer: Self-pay | Admitting: Cardiology

## 2022-11-12 ENCOUNTER — Other Ambulatory Visit: Payer: Self-pay

## 2022-11-12 DIAGNOSIS — Z952 Presence of prosthetic heart valve: Secondary | ICD-10-CM

## 2022-11-12 HISTORY — PX: PACEMAKER IMPLANT: EP1218

## 2022-11-12 SURGERY — PACEMAKER IMPLANT

## 2022-11-12 MED ORDER — CEFAZOLIN SODIUM-DEXTROSE 1-4 GM/50ML-% IV SOLN
1.0000 g | Freq: Four times a day (QID) | INTRAVENOUS | Status: AC
  Administered 2022-11-12 – 2022-11-13 (×3): 1 g via INTRAVENOUS
  Filled 2022-11-12 (×3): qty 50

## 2022-11-12 MED ORDER — SODIUM CHLORIDE 0.9 % IV SOLN
INTRAVENOUS | Status: AC
  Filled 2022-11-12: qty 2

## 2022-11-12 MED ORDER — FENTANYL CITRATE (PF) 100 MCG/2ML IJ SOLN
INTRAMUSCULAR | Status: DC | PRN
  Administered 2022-11-12: 25 ug via INTRAVENOUS

## 2022-11-12 MED ORDER — ONDANSETRON HCL 4 MG/2ML IJ SOLN: 4.0000 mg | Freq: Four times a day (QID) | INTRAMUSCULAR | Status: DC | PRN

## 2022-11-12 MED ORDER — LIDOCAINE HCL 1 % IJ SOLN
INTRAMUSCULAR | Status: AC
  Filled 2022-11-12: qty 40

## 2022-11-12 MED ORDER — FENTANYL CITRATE (PF) 100 MCG/2ML IJ SOLN
INTRAMUSCULAR | Status: AC
  Filled 2022-11-12: qty 2

## 2022-11-12 MED ORDER — ACETAMINOPHEN 325 MG PO TABS: 325.0000 mg | ORAL_TABLET | ORAL | Status: DC | PRN

## 2022-11-12 MED ORDER — MIDAZOLAM HCL 5 MG/5ML IJ SOLN
INTRAMUSCULAR | Status: AC
  Filled 2022-11-12: qty 5

## 2022-11-12 MED ORDER — LIDOCAINE HCL (PF) 1 % IJ SOLN
INTRAMUSCULAR | Status: DC | PRN
  Administered 2022-11-12: 50 mL

## 2022-11-12 MED ORDER — CEFAZOLIN SODIUM-DEXTROSE 2-4 GM/100ML-% IV SOLN
INTRAVENOUS | Status: AC
  Administered 2022-11-12: 2 g via INTRAVENOUS
  Filled 2022-11-12: qty 100

## 2022-11-12 MED ORDER — MIDAZOLAM HCL 5 MG/5ML IJ SOLN
INTRAMUSCULAR | Status: DC | PRN
  Administered 2022-11-12: 1 mg via INTRAVENOUS

## 2022-11-12 SURGICAL SUPPLY — 12 items

## 2022-11-12 NOTE — Progress Notes (Addendum)
      [address]       [place] [address]             [phone]      8 Days Post-Op Procedure(s) (LRB): EXPLORATION POST OPERATIVE OPEN HEART (N/A) Subjective: Pt with no complaints this AM, he states he is doing much better today now that he knows what is going on.  Objective: Vital signs in last 24 hours: Temp:  [97.6 F (36.4 C)-98.4 F (36.9 C)] 97.6 F (36.4 C) (06/11 0728) Pulse Rate:  [96-119] 104 (06/11 0728) Cardiac Rhythm: Sinus tachycardia (06/11 0340) Resp:  [14-20] 19 (06/11 0728) BP: (91-120)/(63-92) 116/86 (06/11 0728) SpO2:  [93 %-98 %] 98 % (06/11 0728) Weight:  [78.2 kg] 78.2 kg (06/11 0643)  Hemodynamic parameters for last 24 hours:    Intake/Output from previous day: 06/10 0701 - 06/11 0700 In: 783 [P.O.:680; I.V.:3; IV Piggyback:100] Out: 400 [Urine:400] Intake/Output this shift: No intake/output data recorded.  General appearance: alert, cooperative, and no distress Neurologic: intact Heart: tachycardic, no murmur Lungs: Diminished at bases Abdomen: soft, non-tender; bowel sounds normal; no masses,  no organomegaly Extremities: edema trace Wound: Clean and dry, no signs of infection  Lab Results: Recent Labs    11/11/22 0127  WBC 9.2  HGB 8.3*  HCT 25.7*  PLT 168   BMET: No results for input(s): "NA", "K", "CL", "CO2", "GLUCOSE", "BUN", "CREATININE", "CALCIUM" in the last 72 hours.  PT/INR: No results for input(s): "LABPROT", "INR" in the last 72 hours. ABG    Component Value Date/Time   PHART 7.324 (L) 11/04/2022 2300   HCO3 22.1 11/04/2022 2300   TCO2 23 11/04/2022 2300   ACIDBASEDEF 4.0 (H) 11/04/2022 2300   O2SAT 97 11/04/2022 2300   CBG (last 3)  No results for input(s): "GLUCAP" in the last 72 hours.  Assessment/Plan: S/P Procedure(s) (LRB): EXPLORATION POST OPERATIVE OPEN HEART (N/A)  CV: HX of CHB, now with tachycardia. For PPM today by EP, appreciate EP following. SBP 116.   Pulm: Baseline  requires 3L [place] oxygen at home. Saturating well on 3L [place]. CXR with atelectasis. Continue nebulizers, IS and ambulation. Maxipime course complete for possible HCAP.   GI: +BM 06/09  Renal: UO 400cc/24 hrs reported. Pt reports he is urinating regularly since foley removal. On Flomax. Stable Cr.   Expected postop ABLA: Improved H/H 8.3/25.7   Dispo: EP to take pt for pacer today.    LOS: 8 days    Jenny Reichmann, PA-C 11/12/2022   Chart reviewed, patient examined, agree with above. Doing well overall. Planning PPM by EP today.

## 2022-11-12 NOTE — Progress Notes (Signed)
Patient Name: Christopher Arias Date of Encounter: 11/11/2022  Primary Cardiologist: Little Ishikawa, MD Electrophysiologist: New  Interval Summary   Frustrated about his LOS, "nothing's being done", I feel worse then before, probably made a mistake in doing this, wants to go home  Inpatient Medications    Scheduled Meds:  aspirin EC  81 mg Oral Daily   atorvastatin  20 mg Oral Daily   budesonide (PULMICORT) nebulizer solution  0.5 mg Nebulization BID   docusate sodium  200 mg Oral Daily   Fe Fum-Vit C-Vit B12-FA  1 capsule Oral QPC breakfast   fluticasone  2 spray Each Nare Daily   fluticasone furoate-vilanterol  1 puff Inhalation Daily   levothyroxine  100 mcg Oral Daily   midodrine  10 mg Oral TID WC   pantoprazole  40 mg Oral Daily   sertraline  200 mg Oral QHS   sodium chloride flush  3 mL Intravenous Q12H   tamsulosin  0.4 mg Oral Daily   umeclidinium bromide  1 puff Inhalation Daily   varenicline  1 mg Oral Daily   Continuous Infusions:  sodium chloride Stopped (11/08/22 2348)   ceFEPime (MAXIPIME) IV 2 g (11/11/22 0328)   PRN Meds: sodium chloride, albuterol, clonazePAM, ondansetron (ZOFRAN) IV, oxyCODONE, sodium chloride, sodium chloride flush, traMADol   Vital Signs    Vitals:   11/10/22 1810 11/10/22 2020 11/10/22 2308 11/11/22 0449  BP:  120/85 138/87 128/84  Pulse: (!) 48 (!) 107 100 (!) 107  Resp: 15 17 16 20   Temp:   97.6 F (36.4 C) 98.3 F (36.8 C)  TempSrc:   Oral Oral  SpO2: 93%  94% 95%  Weight:    79 kg  Height:        Intake/Output Summary (Last 24 hours) at 11/11/2022 0737 Last data filed at 11/10/2022 0830 Gross per 24 hour  Intake 120 ml  Output --  Net 120 ml   Filed Weights   11/08/22 0500 11/10/22 0527 11/11/22 0449  Weight: 75.4 kg 79 kg 79 kg    Physical Exam    Exam remains unchanged GEN: Well nourished, well developed, in no acute distress  HEENT: normal  Neck: no JVD, carotid bruits, or masses Cardiac: RRR; no  murmurs, rubs, or gallops,no edema  Respiratory: no wheezing, normal work of breathing GI: soft, nontender, nondistended MS: no deformity or atrophy  Skin: warm and dry Neuro:  Strength and sensation are intact Psych: euthymic mood, full affect    Telemetry    Sinus versus atrial tachycardia, ventricular pacing overnight   CARDIOLOGY DATA:  11/04/22: Inter-op TEE POST-OP IMPRESSIONS  _ Left Ventricle: The left ventricle is unchanged from pre-bypass.  _ Right Ventricle: The right ventricle appears unchanged from pre-bypass.  _ Aortic Valve: A bioprosthetic valve was placed, leaflets are freely  mobile and  leaflets thin Size; 27mm. No regurgitation post repair. The gradient  recorded  across the prosthetic valve is within the expected range.  _ Mitral Valve: The mitral valve appears unchanged from pre-bypass.  _ Tricuspid Valve: The tricuspid valve appears unchanged from pre-bypass.  _ Pulmonic Valve: The pulmonic valve appears unchanged from pre-bypass.   PRE-OP FINDINGS   Left Ventricle: The left ventricle has normal systolic function, with an  ejection fraction of 55-60%. The cavity size was normal. There is no  increase in left ventricular wall thickness. No evidence of left  ventricular regional wall motion  abnormalities. There is no left ventricular hypertrophy.  Right Ventricle: The right ventricle has normal systolic function. The  cavity was normal. There is no increase in right ventricular wall  thickness.   Left Atrium: Left atrial size was not assessed. No left atrial/left atrial  appendage thrombus was detected.   Right Atrium: Right atrial size was not assessed.   Interatrial Septum: No atrial level shunt detected by color flow Doppler.   Pericardium: There is no evidence of pericardial effusion.   Mitral Valve: The mitral valve is normal in structure. Mitral valve  regurgitation is not visualized by color flow Doppler.   Tricuspid Valve: The tricuspid  valve was normal in structure. Tricuspid  valve regurgitation was not visualized by color flow Doppler.   Aortic Valve: The aortic valve is bicuspid Aortic valve regurgitation is  moderate by color flow Doppler. The jet is eccentric anteriorly directed.    Pulmonic Valve: The pulmonic valve was normal in structure.  Pulmonic valve regurgitation is trivial by color flow Doppler.     08/01/22: TEE 1. Left ventricular ejection fraction, by estimation, is 60 to 65%. The  left ventricle has normal function. The left ventricular internal cavity  size was mildly dilated. Left ventricular diastolic function could not be  evaluated.   2. Right ventricular systolic function is normal. The right ventricular  size is normal.   3. Left atrial size was mildly dilated. No left atrial/left atrial  appendage thrombus was detected.   4. The mitral valve is normal in structure. No evidence of mitral valve  regurgitation.   5. Bicuspid Sievers type 1 valve with severe eccentric AR PISA radius 1  cm, holodiastolic reversals in ascending thoracic aorta, ERO 0.52 cm 2,  and Regurgitant volume 67 cc. The aortic valve is bicuspid. Aortic valve  regurgitation is severe.   6. Aortic dilatation noted. There is moderate dilatation of the aortic  root, measuring 40 mm.    Hospital Course    [name] is a 62 y.o. male with a history of HLD, Hypothyroidism, previous COPD on Home O2, mild OSA not on CPAP, 1st degree AV block, and Bicuspid AV with severe AI who is being seen today for the evaluation of AV block s/p AVR at the request of Dr. Laneta Simmers.   Assessment & Plan    1.  Advanced heart block post AVR:  C/w intermittent CHB w/V pacing and  what appears an AT/ST 110's  POD #8 today Planned for PPM today Inter-op TEE with preserved LVEF Post-op LBBB (?paced) his last EKG with RBBB  D/w the patient today procedure, potential risks/benefits, he remains agreeable  2.  Post AVR: (bioprosthetic) POD #8  today Walked the halls yesterday twice He is feeling better  3.  Acute/chronic hypoxic respiratory failure/COPD:  Home O2 is 3L > back to his home O2  completed cefepime for coverage of/or suspect HCAP     For questions or updates, please contact [facility] Please consult [email or web address] for contact info under Cardiology/STEMI.  Signed, Sheilah Pigeon, PA-C  11/11/2022, 7:37 AM

## 2022-11-12 NOTE — Progress Notes (Signed)
PT Cancellation Note  Patient Details Name: Christopher Arias MRN: [id number] DOB: Dec 21, 1960   Cancelled Treatment:    Reason Eval/Treat Not Completed: Patient at procedure or test/unavailable. Pt preparing to head off unit for PPM placement upon PT arrival. PT will follow up tomorrow per pt request.   Arlyss Gandy 11/12/2022, 4:33 PM

## 2022-11-12 NOTE — Plan of Care (Signed)
  Problem: Pain Managment: Goal: General experience of comfort will improve Outcome: Progressing   Problem: Safety: Goal: Ability to remain free from injury will improve Outcome: Progressing   Problem: Skin Integrity: Goal: Risk for impaired skin integrity will decrease Outcome: Progressing   

## 2022-11-12 NOTE — Progress Notes (Signed)
Pt declined walking, reports he has to take it easy before his procedure today. Did educate pt on some of his restrictions following the procedure. Will f/u as able.   Christopher Congress MS, ACSM-CEP 11/12/2022 10:18 AM

## 2022-11-12 NOTE — Progress Notes (Signed)
Pt returned to 4E13 via bed from cath lab. Received report from [place], [employer] fundraiser. Nonadherent pad with tegaderm dressing to left chest in place; clean, dry, and intact, no drainage. Pt remains in left arm sling. Will continue to monitor.   Per [place], RN pacemaker settings are: DDD 60-130.

## 2022-11-12 NOTE — H&P (View-Only) (Signed)
Patient Name: Christopher Arias Date of Encounter: 11/11/2022  Primary Cardiologist: Christopher L Schumann, MD Electrophysiologist: New  Interval Summary   Frustrated about his LOS, "nothing's being done", I feel worse then before, probably made a mistake in doing this, wants to go home  Inpatient Medications    Scheduled Meds:  aspirin EC  81 mg Oral Daily   atorvastatin  20 mg Oral Daily   budesonide (PULMICORT) nebulizer solution  0.5 mg Nebulization BID   docusate sodium  200 mg Oral Daily   Fe Fum-Vit C-Vit B12-FA  1 capsule Oral QPC breakfast   fluticasone  2 spray Each Nare Daily   fluticasone furoate-vilanterol  1 puff Inhalation Daily   levothyroxine  100 mcg Oral Daily   midodrine  10 mg Oral TID WC   pantoprazole  40 mg Oral Daily   sertraline  200 mg Oral QHS   sodium chloride flush  3 mL Intravenous Q12H   tamsulosin  0.4 mg Oral Daily   umeclidinium bromide  1 puff Inhalation Daily   varenicline  1 mg Oral Daily   Continuous Infusions:  sodium chloride Stopped (11/08/22 2348)   ceFEPime (MAXIPIME) IV 2 g (11/11/22 0328)   PRN Meds: sodium chloride, albuterol, clonazePAM, ondansetron (ZOFRAN) IV, oxyCODONE, sodium chloride, sodium chloride flush, traMADol   Vital Signs    Vitals:   11/10/22 1810 11/10/22 2020 11/10/22 2308 11/11/22 0449  BP:  120/85 138/87 128/84  Pulse: (!) 48 (!) 107 100 (!) 107  Resp: 15 17 16 20  Temp:   97.6 F (36.4 C) 98.3 F (36.8 C)  TempSrc:   Oral Oral  SpO2: 93%  94% 95%  Weight:    79 kg  Height:        Intake/Output Summary (Last 24 hours) at 11/11/2022 0737 Last data filed at 11/10/2022 0830 Gross per 24 hour  Intake 120 ml  Output --  Net 120 ml   Filed Weights   11/08/22 0500 11/10/22 0527 11/11/22 0449  Weight: 75.4 kg 79 kg 79 kg    Physical Exam    Exam remains unchanged GEN: Well nourished, well developed, in no acute distress  HEENT: normal  Neck: no JVD, carotid bruits, or masses Cardiac: RRR; no  murmurs, rubs, or gallops,no edema  Respiratory: no wheezing, normal work of breathing GI: soft, nontender, nondistended MS: no deformity or atrophy  Skin: warm and dry Neuro:  Strength and sensation are intact Psych: euthymic mood, full affect    Telemetry    Sinus versus atrial tachycardia, ventricular pacing overnight   CARDIOLOGY DATA:  11/04/22: Inter-op TEE POST-OP IMPRESSIONS  _ Left Ventricle: The left ventricle is unchanged from pre-bypass.  _ Right Ventricle: The right ventricle appears unchanged from pre-bypass.  _ Aortic Valve: A bioprosthetic valve was placed, leaflets are freely  mobile and  leaflets thin Size; 27mm. No regurgitation post repair. The gradient  recorded  across the prosthetic valve is within the expected range.  _ Mitral Valve: The mitral valve appears unchanged from pre-bypass.  _ Tricuspid Valve: The tricuspid valve appears unchanged from pre-bypass.  _ Pulmonic Valve: The pulmonic valve appears unchanged from pre-bypass.   PRE-OP FINDINGS   Left Ventricle: The left ventricle has normal systolic function, with an  ejection fraction of 55-60%. The cavity size was normal. There is no  increase in left ventricular wall thickness. No evidence of left  ventricular regional wall motion  abnormalities. There is no left ventricular hypertrophy.     Right Ventricle: The right ventricle has normal systolic function. The  cavity was normal. There is no increase in right ventricular wall  thickness.   Left Atrium: Left atrial size was not assessed. No left atrial/left atrial  appendage thrombus was detected.   Right Atrium: Right atrial size was not assessed.   Interatrial Septum: No atrial level shunt detected by color flow Doppler.   Pericardium: There is no evidence of pericardial effusion.   Mitral Valve: The mitral valve is normal in structure. Mitral valve  regurgitation is not visualized by color flow Doppler.   Tricuspid Valve: The tricuspid  valve was normal in structure. Tricuspid  valve regurgitation was not visualized by color flow Doppler.   Aortic Valve: The aortic valve is bicuspid Aortic valve regurgitation is  moderate by color flow Doppler. The jet is eccentric anteriorly directed.    Pulmonic Valve: The pulmonic valve was normal in structure.  Pulmonic valve regurgitation is trivial by color flow Doppler.     08/01/22: TEE 1. Left ventricular ejection fraction, by estimation, is 60 to 65%. The  left ventricle has normal function. The left ventricular internal cavity  size was mildly dilated. Left ventricular diastolic function could not be  evaluated.   2. Right ventricular systolic function is normal. The right ventricular  size is normal.   3. Left atrial size was mildly dilated. No left atrial/left atrial  appendage thrombus was detected.   4. The mitral valve is normal in structure. No evidence of mitral valve  regurgitation.   5. Bicuspid Sievers type 1 valve with severe eccentric AR PISA radius 1  cm, holodiastolic reversals in ascending thoracic aorta, ERO 0.52 cm 2,  and Regurgitant volume 67 cc. The aortic valve is bicuspid. Aortic valve  regurgitation is severe.   6. Aortic dilatation noted. There is moderate dilatation of the aortic  root, measuring 40 mm.    Hospital Course    Loranzo R Barris is a 61 y.o. male with a history of HLD, Hypothyroidism, previous COPD on Home O2, mild OSA not on CPAP, 1st degree AV block, and Bicuspid AV with severe AI who is being seen today for the evaluation of AV block s/p AVR at the request of Dr. Bartle.   Assessment & Plan    1.  Advanced heart block post AVR:  C/w intermittent CHB w/V pacing and  what appears an AT/ST 110's  POD #8 today Planned for PPM today Inter-op TEE with preserved LVEF Post-op LBBB (?paced) his last EKG with RBBB  D/w the patient today procedure, potential risks/benefits, he remains agreeable  2.  Post AVR: (bioprosthetic) POD #8  today Walked the halls yesterday twice He is feeling better  3.  Acute/chronic hypoxic respiratory failure/COPD:  Home O2 is 3L > back to his home O2  completed cefepime for coverage of/or suspect HCAP     For questions or updates, please contact [facility] Please consult [email or web address] for contact info under Cardiology/STEMI.  Signed, Abigaelle Verley Lynn Aymen Widrig, PA-C  11/11/2022, 7:37 AM  

## 2022-11-12 NOTE — Interval H&P Note (Signed)
History and Physical Interval Note:  11/12/2022 3:19 PM  Christopher Arias  has presented today for surgery, with the diagnosis of tachibrady.  The various methods of treatment have been discussed with the patient and family. After consideration of risks, benefits and other options for treatment, the patient has consented to  Procedure(s): PACEMAKER IMPLANT (N/A) as a surgical intervention.  The patient's history has been reviewed, patient examined, no change in status, stable for surgery.  I have reviewed the patient's chart and labs.  Questions were answered to the patient's satisfaction.     Buna Cuppett [employer]

## 2022-11-13 ENCOUNTER — Encounter (HOSPITAL_COMMUNITY): Payer: Self-pay | Admitting: Cardiology

## 2022-11-13 ENCOUNTER — Inpatient Hospital Stay (HOSPITAL_COMMUNITY): Payer: BC Managed Care – PPO

## 2022-11-13 LAB — BASIC METABOLIC PANEL
Anion gap: 10 (ref 5–15)
BUN: 14 mg/dL (ref 8–23)
CO2: 25 mmol/L (ref 22–32)
Calcium: 8.4 mg/dL — ABNORMAL LOW (ref 8.9–10.3)
Chloride: 101 mmol/L (ref 98–111)
Creatinine, Ser: 0.91 mg/dL (ref 0.61–1.24)
GFR, Estimated: >60 mL/min (ref >60)
Glucose, Bld: 140 mg/dL — ABNORMAL HIGH (ref 70–99)
Potassium: 4.1 mmol/L (ref 3.5–5.1)
Sodium: 136 mmol/L (ref 135–145)

## 2022-11-13 LAB — CBC
HCT: 29.8 % — ABNORMAL LOW (ref 39.0–52.0)
Hemoglobin: 9.6 g/dL — ABNORMAL LOW (ref 13.0–17.0)
MCH: 30.2 pg (ref 26.0–34.0)
MCHC: 32.2 g/dL (ref 30.0–36.0)
MCV: 93.7 fL (ref 80.0–100.0)
Platelets: 238 10*3/uL (ref 150–400)
RBC: 3.18 MIL/uL — ABNORMAL LOW (ref 4.22–5.81)
RDW: 15.9 % — ABNORMAL HIGH (ref 11.5–15.5)
WBC: 10.7 10*3/uL — ABNORMAL HIGH (ref 4.0–10.5)
nRBC: 0 % (ref 0.0–0.2)

## 2022-11-13 MED ORDER — DIGOXIN 125 MCG PO TABS
0.1250 mg | ORAL_TABLET | Freq: Every day | ORAL | Status: DC
  Administered 2022-11-13 – 2022-11-14 (×2): 0.125 mg via ORAL
  Filled 2022-11-13 (×2): qty 1

## 2022-11-13 MED ORDER — FUROSEMIDE 40 MG PO TABS
40.0000 mg | ORAL_TABLET | Freq: Once | ORAL | Status: AC
  Administered 2022-11-13: 40 mg via ORAL
  Filled 2022-11-13: qty 1

## 2022-11-13 MED ORDER — POTASSIUM CHLORIDE CRYS ER 20 MEQ PO TBCR
20.0000 meq | EXTENDED_RELEASE_TABLET | Freq: Once | ORAL | Status: AC
  Administered 2022-11-13: 20 meq via ORAL
  Filled 2022-11-13: qty 1

## 2022-11-13 NOTE — Progress Notes (Addendum)
Patient Name: Christopher Arias Date of Encounter: 11/13/2022  Primary Cardiologist: Little Ishikawa, MD Electrophysiologist: New  Interval Summary   Doing well, denies pain at any of his surgical sites  Inpatient Medications    Scheduled Meds:  aspirin EC  81 mg Oral Daily   atorvastatin  20 mg Oral Daily   budesonide (PULMICORT) nebulizer solution  0.5 mg Nebulization BID   docusate sodium  200 mg Oral Daily   Fe Fum-Vit C-Vit B12-FA  1 capsule Oral QPC breakfast   fluticasone  2 spray Each Nare Daily   fluticasone furoate-vilanterol  1 puff Inhalation Daily   furosemide  40 mg Oral Once   levothyroxine  100 mcg Oral Daily   pantoprazole  40 mg Oral Daily   potassium chloride  20 mEq Oral Once   sertraline  200 mg Oral QHS   sodium chloride flush  3 mL Intravenous Q12H   sodium chloride flush  3 mL Intravenous Q12H   tamsulosin  0.4 mg Oral Daily   umeclidinium bromide  1 puff Inhalation Daily   varenicline  1 mg Oral Daily   Continuous Infusions:  sodium chloride Stopped (11/08/22 2348)    ceFAZolin (ANCEF) IV 1 g (11/13/22 0431)   PRN Meds: sodium chloride, acetaminophen, albuterol, clonazePAM, ondansetron (ZOFRAN) IV, oxyCODONE, sodium chloride, sodium chloride flush, traMADol   Vital Signs    Vitals:   11/13/22 0244 11/13/22 0430 11/13/22 0452 11/13/22 0755  BP: 111/79 119/87  114/80  Pulse: 96 100  (!) 113  Resp: 18 19 20 15   Temp: 98.7 F (37.1 C) 98.2 F (36.8 C)  98.6 F (37 C)  TempSrc: Oral Oral  Oral  SpO2: 98% 93%  92%  Weight:   78.7 kg   Height:        Intake/Output Summary (Last 24 hours) at 11/13/2022 0841 Last data filed at 11/13/2022 0450 Gross per 24 hour  Intake 967.15 ml  Output 750 ml  Net 217.15 ml   Filed Weights   11/11/22 0449 11/12/22 0643 11/13/22 0452  Weight: 79 kg 78.2 kg 78.7 kg    Physical Exam    GEN: Well nourished, well developed, in no acute distress  HEENT: normal  Neck: no JVD, carotid bruits, or  masses Cardiac: RRR; no murmurs, rubs, or gallops,no edema  Respiratory: no wheezing, normal work of breathing GI: soft, nontender, nondistended MS: no deformity or atrophy  Skin: warm and dry Neuro:  Strength and sensation are intact Psych: euthymic mood, full affect    PPM site is stable, no bleeding or hematoma  Telemetry    Sinus versus atrial tachycardia, ventricular pacing overnight   CARDIOLOGY DATA:  11/04/22: Inter-op TEE POST-OP IMPRESSIONS  _ Left Ventricle: The left ventricle is unchanged from pre-bypass.  _ Right Ventricle: The right ventricle appears unchanged from pre-bypass.  _ Aortic Valve: A bioprosthetic valve was placed, leaflets are freely  mobile and  leaflets thin Size; 27mm. No regurgitation post repair. The gradient  recorded  across the prosthetic valve is within the expected range.  _ Mitral Valve: The mitral valve appears unchanged from pre-bypass.  _ Tricuspid Valve: The tricuspid valve appears unchanged from pre-bypass.  _ Pulmonic Valve: The pulmonic valve appears unchanged from pre-bypass.   PRE-OP FINDINGS   Left Ventricle: The left ventricle has normal systolic function, with an  ejection fraction of 55-60%. The cavity size was normal. There is no  increase in left ventricular wall thickness. No evidence of left  ventricular regional wall motion  abnormalities. There is no left ventricular hypertrophy.   Right Ventricle: The right ventricle has normal systolic function. The  cavity was normal. There is no increase in right ventricular wall  thickness.   Left Atrium: Left atrial size was not assessed. No left atrial/left atrial  appendage thrombus was detected.   Right Atrium: Right atrial size was not assessed.   Interatrial Septum: No atrial level shunt detected by color flow Doppler.   Pericardium: There is no evidence of pericardial effusion.   Mitral Valve: The mitral valve is normal in structure. Mitral valve  regurgitation is  not visualized by color flow Doppler.   Tricuspid Valve: The tricuspid valve was normal in structure. Tricuspid  valve regurgitation was not visualized by color flow Doppler.   Aortic Valve: The aortic valve is bicuspid Aortic valve regurgitation is  moderate by color flow Doppler. The jet is eccentric anteriorly directed.    Pulmonic Valve: The pulmonic valve was normal in structure.  Pulmonic valve regurgitation is trivial by color flow Doppler.     08/01/22: TEE 1. Left ventricular ejection fraction, by estimation, is 60 to 65%. The  left ventricle has normal function. The left ventricular internal cavity  size was mildly dilated. Left ventricular diastolic function could not be  evaluated.   2. Right ventricular systolic function is normal. The right ventricular  size is normal.   3. Left atrial size was mildly dilated. No left atrial/left atrial  appendage thrombus was detected.   4. The mitral valve is normal in structure. No evidence of mitral valve  regurgitation.   5. Bicuspid Sievers type 1 valve with severe eccentric AR PISA radius 1  cm, holodiastolic reversals in ascending thoracic aorta, ERO 0.52 cm 2,  and Regurgitant volume 67 cc. The aortic valve is bicuspid. Aortic valve  regurgitation is severe.   6. Aortic dilatation noted. There is moderate dilatation of the aortic  root, measuring 40 mm.    Hospital Course    Christopher Arias is a 62 y.o. male with a history of HLD, Hypothyroidism, previous COPD on Home O2, mild OSA not on CPAP, 1st degree AV block, and Bicuspid AV with severe AI who is being seen today for the evaluation of AV block s/p AVR at the request of Dr. Laneta Simmers.   Assessment & Plan    1.  Advanced heart block post AVR:  He c/w intermittent CHB w/V pacing and  what appears an AT/ST 110's  >> PPM  POD #9 today Planned for PPM today Inter-op TEE with preserved LVEF Post-op LBBB (?paced) his last EKG with RBBB  Now is s/p PPM implant yesterday with  Dr. Elberta Fortis Site is stable Device check this am stable CXR this AM with stable lead position, no OTX Site care and activity restrictions were reviewed with the patient  OK to pull epicardial wires for our perspective OK to discharge from EP perspective when ready to otherwise    2.  Post AVR: (bioprosthetic) POD #9 today Continues to feel better   3.  Acute/chronic hypoxic respiratory failure/COPD:  Home O2 is 3L > on 4L today  completed cefepime for coverage of/or suspect HCAP C/w attending team   4. ST/ATach He is currently on midodrine  Recommend if/when able to get him off the midodrine to try low dose beta blocker if able to tolerate from pulmonary perspective or perhaps diltiazem with preserved LVEF Deferred to attending/CTS service   Dr. Elberta Fortis has seen  the patient We Christopher Arias sign off though remain available, please call if needed   For questions or updates, please contact [facility] Please consult [email or web address] for contact info under Cardiology/STEMI.  Signed, Christopher Pigeon, PA-C  11/13/2022, 8:41 AM   I have seen and examined this patient with Christopher Arias.  Agree with above, note added to reflect my findings.  On exam, RRR, no murmurs, lungs clear.  He is now status post [employer] pacemaker for intermittent heart block.  Device functioning appropriately.  Chest x-ray and interrogation without issue.  Okay for discharge today with follow-up in device clinic.  If midodrine can be DC'd, would start metoprolol 25 mg twice daily for sinus/atrial tachycardia.  Shanelle Clontz M. Vincent Streater MD 11/13/2022 12:38 PM

## 2022-11-13 NOTE — Progress Notes (Signed)
SATURATION QUALIFICATIONS: (This note is used to comply with regulatory documentation for home oxygen)  Patient Saturations on Room Air at Rest = n/a%  Patient Saturations on 3L while Ambulating = 85%  Patient Saturations on 4 Liters of oxygen while Ambulating = 91%  Please briefly explain why patient needs home oxygen:  Pt already has O2 at home, 3L. He required 4L while ambulating hall 470 ft.  Ethelda Chick BS, ACSM-CEP 11/13/2022 1:42 PM

## 2022-11-13 NOTE — Progress Notes (Signed)
Discussed with pt, wife, and sister in law IS, sternal precautions, smoking cessation, exercise, and CRPII. Pt receptive. He struggles to remember precautions therefore reapplied his sling. Family supportive.  [id number] Ethelda Chick BS, ACSM-CEP 11/13/2022 2:10 PM

## 2022-11-13 NOTE — Progress Notes (Signed)
CARDIAC REHAB PHASE I   PRE:  Rate/Rhythm: 112 ST, periods of CHB 60    BP: sitting 108/81    SpO2: 93 4L HFNC, 93 3L  MODE:  Ambulation: 470 ft   POST:  Rate/Rhythm: 140 ST    BP: sitting 85/73 after 5 min, recheck 98/71     SpO2: 91 4L   Pt needs reminders for pacer precautions. Moved out of bed on right side with appropriate mechanics. To BR then ambulated hall with standby assist. Initially on 3L however SpO2 85 3L after 70 ft. Increased to 4L and pt able to walk the rest of hall on 4L. HR up to 140 ST. BP low in recliner after walk. Pt denied dizziness. Pt was significantly SOB while moving/walking. He also has significant anxiety. His wife and friend are coming later, will educate with everyone given pts unreliability of facts.  [phone]   Ethelda Chick BS, ACSM-CEP 11/13/2022 11:54 AM

## 2022-11-13 NOTE — Evaluation (Signed)
Occupational Therapy Evaluation Patient Details Name: Christopher Arias MRN: [id number] DOB: September 16, 1960 Today's Date: 11/13/2022   History of Present Illness Pt is a 62 yo male admitted for AVR on 6/3 with median sternotomy due to aortic insufficiency.  Pt was returned to OR same day for reexploration and removal of hematoma. Pt with episodes of complete heart block after surgery therefore PPM was performed.  PMH: aortic insuffiency.   Clinical Impression   Pt admitted with the above diagnosis and has the deficits outlined below. Overall pt is doing very well with basic adls and adl transfers but would benefit from one more session of OT, if still here, to address sternal precautions and PPM precautions as they relate to his adls.  Pt can state precautions but has difficulty implementing them at times. Pt has been educated in all techniques to follow these precautions and his wife will be there to assist 24/7 at home therefore no further post acute OT needed. Pt was independent PTA and feel he will progress well with adls once precautions become more standard for him.      Recommendations for follow up therapy are one component of a multi-disciplinary discharge planning process, led by the attending physician.  Recommendations may be updated based on patient status, additional functional criteria and insurance authorization.   Assistance Recommended at Discharge Intermittent Supervision/Assistance  Patient can return home with the following A little help with bathing/dressing/bathroom;Assist for transportation    Functional Status Assessment  Patient has had a recent decline in their functional status and demonstrates the ability to make significant improvements in function in a reasonable and predictable amount of time.  Equipment Recommendations  None recommended by OT    Recommendations for Other Services       Precautions / Restrictions Precautions Precautions:  ICD/Pacemaker;Sternal Precaution Comments: sternal precautions reviewed at length as pt was able to clearly state them but had difficulty following through on them. Required Braces or Orthoses: Sling Restrictions Weight Bearing Restrictions: Yes RUE Weight Bearing: Non weight bearing LUE Weight Bearing: Non weight bearing Other Position/Activity Restrictions: no pushing/pulling/reaching over 90degrees      Mobility Bed Mobility Overal bed mobility: Needs Assistance Bed Mobility: Sit to Supine, Supine to Sit     Supine to sit: Supervision Sit to supine: Supervision   General bed mobility comments: cues for precautions and not pushing/pulling    Transfers Overall transfer level: Needs assistance Equipment used: None Transfers: Sit to/from Stand Sit to Stand: Supervision           General transfer comment: cues for precautions      Balance Overall balance assessment: No apparent balance deficits (not formally assessed)                                         ADL either performed or assessed with clinical judgement   ADL Overall ADL's : Needs assistance/impaired Eating/Feeding: Independent;Sitting   Grooming: Wash/dry hands;Wash/dry face;Oral care;Supervision/safety;Cueing for UE precautions;Standing Grooming Details (indicate cue type and reason): Pt stood to groom at sink for 3 minutes. Upper Body Bathing: Set up;Sitting   Lower Body Bathing: Minimal assistance;Sit to/from stand;Cueing for compensatory techniques Lower Body Bathing Details (indicate cue type and reason): educated regarding the use of LUE during adls, no pushing/ pulling etc Upper Body Dressing : Minimal assistance;Sitting Upper Body Dressing Details (indicate cue type and reason): educated in regards to  button down shirts, putting L arm in first and using very loose pull over shirts for first few weeks. Lower Body Dressing: Minimal assistance;Cueing for compensatory  techniques Lower Body Dressing Details (indicate cue type and reason): assist to pull pants up to follow precautions. Toilet Transfer: Supervision/safety;Ambulation;Comfort height toilet Toilet Transfer Details (indicate cue type and reason): cues for sternal precautions Toileting- Clothing Manipulation and Hygiene: Min guard;Sit to/from stand Toileting - Clothing Manipulation Details (indicate cue type and reason): educated pt in cleaning self with sternal precautions.   Tub/Shower Transfer Details (indicate cue type and reason): talked about showering at home. Pt has a shower seat in his shower and will "sponge bathe" for first day or two until allowed to get in shower. Functional mobility during ADLs: Min guard (pt requested to push own O2) General ADL Comments: Pt doing well with all basic adls. Cues needed to follow sternal and PPM precautions. Pt can state the precautions and discuss them but has a difficult time implementing them.     Vision Baseline Vision/History: 1 Wears glasses Ability to See in Adequate Light: 0 Adequate Patient Visual Report: No change from baseline Vision Assessment?: No apparent visual deficits     Perception Perception Perception Tested?: No   Praxis Praxis Praxis tested?: Within functional limits    Pertinent Vitals/Pain Pain Assessment Pain Assessment: Faces Faces Pain Scale: Hurts a little bit Pain Location: chest Pain Descriptors / Indicators: Sore Pain Intervention(s): Monitored during session, Repositioned     Hand Dominance Left   Extremity/Trunk Assessment Upper Extremity Assessment Upper Extremity Assessment: RUE deficits/detail;LUE deficits/detail RUE Deficits / Details: WFL within limites of sternal and PPM precautions RUE Sensation: WNL RUE Coordination: WNL LUE Deficits / Details: WFL within limits of sternal and PPM precautions LUE Sensation: WNL LUE Coordination: WNL   Lower Extremity Assessment Lower Extremity Assessment:  Defer to PT evaluation   Cervical / Trunk Assessment Cervical / Trunk Assessment: Normal   Communication Communication Communication: No difficulties   Cognition Arousal/Alertness: Awake/alert Behavior During Therapy: WFL for tasks assessed/performed Overall Cognitive Status: Within Functional Limits for tasks assessed                                 General Comments: Pt requires cues for sternal and PPM precautions.     General Comments  Pt overall doing well with adls. Pt limited by external pacer wires and HR up to 133 which MD stated was fine for where pt is in his recovery.    Exercises     Shoulder Instructions      Home Living Family/patient expects to be discharged to:: Private residence Living Arrangements: Spouse/significant other Available Help at Discharge: Family;Available 24 hours/day Type of Home: House Home Access: Stairs to enter [employer] of Steps: 3 Entrance Stairs-Rails: Left;Right;Can reach both Home Layout: One level     Bathroom Shower/Tub: Producer, television/film/video: Handicapped height     Home Equipment: Shower seat;Grab bars - toilet;Grab bars - tub/shower;Cane - single point;Rollator (4 wheels)          Prior Functioning/Environment Prior Level of Function : Independent/Modified Independent             Mobility Comments: walked independently ADLs Comments: no assist with adls PTA        OT Problem List: Decreased activity tolerance;Decreased knowledge of precautions;Cardiopulmonary status limiting activity;Impaired UE functional use      OT Treatment/Interventions:  Self-care/ADL training;DME and/or AE instruction;Therapeutic activities    OT Goals(Current goals can be found in the care plan section) Acute Rehab OT Goals Patient Stated Goal: to go home soon OT Goal Formulation: With patient Time For Goal Achievement: 11/27/22 Potential to Achieve Goals: Good ADL Goals Pt Will Perform Lower  Body Bathing: with modified independence;sit to/from stand Pt Will Perform Lower Body Dressing: with modified independence;sit to/from stand Additional ADL Goal #1: Pt will walk to bathroom and complete all toileting with mod I following precautions. Additional ADL Goal #2: Pt will follow sternal precautions during all adls with min cues.  OT Frequency: Min 2X/week    Co-evaluation              AM-PAC OT "6 Clicks" Daily Activity     Outcome Measure Help from another person eating meals?: None Help from another person taking care of personal grooming?: None Help from another person toileting, which includes using toliet, bedpan, or urinal?: A Little Help from another person bathing (including washing, rinsing, drying)?: A Little Help from another person to put on and taking off regular upper body clothing?: A Little Help from another person to put on and taking off regular lower body clothing?: A Little 6 Click Score: 20   End of Session Equipment Utilized During Treatment: Oxygen Nurse Communication: Mobility status  Activity Tolerance: Patient limited by fatigue Patient left: in chair;with call bell/phone within reach  OT Visit Diagnosis: Unsteadiness on feet (R26.81)                Time: 0272-5366 OT Time Calculation (min): 27 min Charges:  OT General Charges $OT Visit: 1 Visit OT Evaluation $OT Eval Low Complexity: 1 Low OT Treatments $Self Care/Home Management : 8-22 mins  Nayib, Remer 11/13/2022, 9:08 AM

## 2022-11-13 NOTE — Evaluation (Signed)
Physical Therapy Evaluation Patient Details Name: Christopher Arias MRN: [id number] DOB: [birth year] Today's Date: 11/13/2022  History of Present Illness  Pt is a 62 yo male admitted for AVR on 6/3 with median sternotomy due to aortic insufficiency.  Pt was returned to OR same day for reexploration and removal of hematoma. Pt with episodes of complete heart block after surgery therefore PPM was performed.  PMH: aortic insuffiency.   Clinical Impression  Christopher Arias is 62 y.o. male admitted with above HPI and diagnosis. Patient is currently limited by functional impairments below (see PT problem list). Patient lives with spouse and is independent at baseline. Reviewed/educated pt on sternal and PPM precautions. Reviewed fit of sling for Lt UE. Pt able to performed sit<>stand without use of Ue's and completed bed mobility while maintaining precautions. Pt ambulated ~270' with no AD, min guard/supervision for safety. HR in 120's-137 bpm max with ambulation. Patient will benefit from continued skilled PT interventions to address impairments and progress independence with mobility. Acute PT will follow and progress as able.        Recommendations for follow up therapy are one component of a multi-disciplinary discharge planning process, led by the attending physician.  Recommendations may be updated based on patient status, additional functional criteria and insurance authorization.  Follow Up Recommendations       Assistance Recommended at Discharge Set up Supervision/Assistance  Patient can return home with the following  A little help with walking and/or transfers;A little help with bathing/dressing/bathroom;Assistance with cooking/housework;Direct supervision/assist for medications management;Assist for transportation;Help with stairs or ramp for entrance    Equipment Recommendations None recommended by PT  Recommendations for Other Services       Functional Status Assessment Patient has had a  recent decline in their functional status and demonstrates the ability to make significant improvements in function in a reasonable and predictable amount of time.     Precautions / Restrictions Precautions Precautions: ICD/Pacemaker;Sternal Precaution Comments: sternal precautions reviewed at length as pt was able to clearly state them but had difficulty following through on them. Required Braces or Orthoses: Sling Restrictions Weight Bearing Restrictions: Yes RUE Weight Bearing: Non weight bearing LUE Weight Bearing: Non weight bearing Other Position/Activity Restrictions: no pushing/pulling/reaching over 90degrees      Mobility  Bed Mobility Overal bed mobility: Needs Assistance Bed Mobility: Sit to Supine, Supine to Sit     Supine to sit: Supervision Sit to supine: Supervision   General bed mobility comments: cues for precautions and not pushing/pulling. pt using bed features, no    Transfers Overall transfer level: Needs assistance Equipment used: None Transfers: Sit to/from Stand Sit to Stand: Supervision           General transfer comment: cues for precautions; pt able to complete sit<>stand with no UE use.    Ambulation/Gait Ambulation/Gait assistance: Min guard, Supervision Gait Distance (Feet): 270 Feet Assistive device: None (portable O2) Gait Pattern/deviations: Step-through pattern, Decreased stride length, Drifts right/left Gait velocity: decr     General Gait Details: pt able to pull portable O2 tank in Rt UE with cues at start to keep handle by hip for safe steering. pt with slight drift Rt/Lt but no overt LOB throughout. pt on 4L/min, HR in 120's-137bpm max. pt required 3 standing rest breaks and cues to breath in through nose as has tendency to breath in through mouth instead.  Stairs            Wheelchair Mobility    Modified  Rankin (Stroke Patients Only)       Balance Overall balance assessment: Mild deficits observed, not formally  tested                                           Pertinent Vitals/Pain Pain Assessment Pain Assessment: Faces Faces Pain Scale: Hurts a little bit Pain Location: chest Pain Descriptors / Indicators: Sore Pain Intervention(s): Limited activity within patient's tolerance, Monitored during session, Premedicated before session    Home Living Family/patient expects to be discharged to:: Private residence Living Arrangements: Spouse/significant other Available Help at Discharge: Family;Available 24 hours/day Type of Home: House Home Access: Stairs to enter Entrance Stairs-Rails: Left;Right;Can reach both Entrance Stairs-Number of Steps: 3   Home Layout: One level Home Equipment: Shower seat;Grab bars - toilet;Grab bars - tub/shower;Cane - single point;Rollator (4 wheels)      Prior Function Prior Level of Function : Independent/Modified Independent             Mobility Comments: walked independently ADLs Comments: no assist with adls PTA     Hand Dominance   Dominant Hand: Left    Extremity/Trunk Assessment   Upper Extremity Assessment Upper Extremity Assessment: Defer to OT evaluation RUE Deficits / Details: WFL within limites of sternal and PPM precautions RUE Sensation: WNL RUE Coordination: WNL LUE Deficits / Details: WFL within limits of sternal and PPM precautions LUE Sensation: WNL LUE Coordination: WNL    Lower Extremity Assessment Lower Extremity Assessment: Overall WFL for tasks assessed    Cervical / Trunk Assessment Cervical / Trunk Assessment: Normal  Communication   Communication: No difficulties  Cognition Arousal/Alertness: Awake/alert Behavior During Therapy: WFL for tasks assessed/performed Overall Cognitive Status: Within Functional Limits for tasks assessed                                 General Comments: Pt requires cues for sternal and PPM precautions.        General Comments      Exercises      Assessment/Plan    PT Assessment Patient needs continued PT services  PT Problem List Decreased activity tolerance;Decreased balance;Decreased mobility;Decreased safety awareness;Decreased knowledge of use of DME;Cardiopulmonary status limiting activity       PT Treatment Interventions DME instruction;Gait training;Stair training;Functional mobility training;Therapeutic activities;Therapeutic exercise;Balance training;Neuromuscular re-education;Cognitive remediation;Patient/family education;Wheelchair mobility training    PT Goals (Current goals can be found in the Care Plan section)  Acute Rehab PT Goals Patient Stated Goal: get home PT Goal Formulation: With patient Time For Goal Achievement: 11/27/22 Potential to Achieve Goals: Good    Frequency Min 1X/week     Co-evaluation               AM-PAC PT "6 Clicks" Mobility  Outcome Measure Help needed turning from your back to your side while in a flat bed without using bedrails?: None Help needed moving from lying on your back to sitting on the side of a flat bed without using bedrails?: None Help needed moving to and from a bed to a chair (including a wheelchair)?: A Little Help needed standing up from a chair using your arms (e.g., wheelchair or bedside chair)?: A Little Help needed to walk in hospital room?: A Little Help needed climbing 3-5 steps with a railing? : A Little 6 Click Score: 20  End of Session Equipment Utilized During Treatment: Gait belt;Oxygen;Other (comment) (Lt UE sling) Activity Tolerance: Patient tolerated treatment well Patient left: in bed;with call bell/phone within reach;with family/visitor present Nurse Communication: Mobility status PT Visit Diagnosis: Muscle weakness (generalized) (M62.81);Difficulty in walking, not elsewhere classified (R26.2);Unsteadiness on feet (R26.81);Other abnormalities of gait and mobility (R26.89)    Time: 1610-9604 PT Time Calculation (min) (ACUTE ONLY): 18  min   Charges:   PT Evaluation $PT Eval Moderate Complexity: 1 Mod          Wynn Maudlin, DPT Acute Rehabilitation Services Office [phone]  11/13/22 3:22 PM

## 2022-11-13 NOTE — Progress Notes (Addendum)
      [address]       Christopher Arias [id number]             [phone]      1 Day Post-Op Procedure(s) (LRB): PACEMAKER IMPLANT (N/A) Subjective: Pt feels good and would like to go home today.   Objective: Vital signs in last 24 hours: Temp:  [97.6 F (36.4 C)-98.7 F (37.1 C)] 98.2 F (36.8 C) (06/12 0430) Pulse Rate:  [0-115] 100 (06/12 0430) Cardiac Rhythm: Normal sinus rhythm (06/12 0330) Resp:  [14-21] 20 (06/12 0452) BP: (92-159)/(67-132) 119/87 (06/12 0430) SpO2:  [88 %-98 %] 93 % (06/12 0430) Weight:  [78.7 kg] 78.7 kg (06/12 0452)  Hemodynamic parameters for last 24 hours:    Intake/Output from previous day: 06/11 0701 - 06/12 0700 In: 967.2 [P.O.:480; I.V.:487.2] Out: 750 [Urine:750] Intake/Output this shift: No intake/output data recorded.  General appearance: alert, cooperative, and no distress Neurologic: intact Heart: Sinus tachycardia, no murmur, PPM in place Lungs: Some course rhonchi, otherwise clear to auscultation Abdomen: soft, non-tender; bowel sounds normal; no masses,  no organomegaly Extremities: edema trace Wound: Clean and dry  Lab Results: Recent Labs    11/11/22 0127  WBC 9.2  HGB 8.3*  HCT 25.7*  PLT 168   BMET: No results for input(s): "NA", "K", "CL", "CO2", "GLUCOSE", "BUN", "CREATININE", "CALCIUM" in the last 72 hours.  PT/INR: No results for input(s): "LABPROT", "INR" in the last 72 hours. ABG    Component Value Date/Time   PHART 7.324 (L) 11/04/2022 2300   HCO3 22.1 11/04/2022 2300   TCO2 23 11/04/2022 2300   ACIDBASEDEF 4.0 (H) 11/04/2022 2300   O2SAT 97 11/04/2022 2300   CBG (last 3)  No results for input(s): "GLUCAP" in the last 72 hours.  Assessment/Plan: S/P Procedure(s) (LRB): PACEMAKER IMPLANT (N/A)  CV: HX of CHB, EP placed a PPM yesterday. Sinus tachycardia this AM with PVCs, HR 98-108. SBP 119. On Midodrine 5mg  TID, d/c Midodrine? Discussed with EP, will D/C EPW. EP recommended started  Metoprolol once off Midodrine, appreciate their assistance.  Pulm: Baseline requires 3L [place] oxygen at home. Was at baseline yesterday, now on 4L  saturating 90%. Wean as tolerated. CXR with atelectasis. Continue nebulizers, IS and ambulation.    GI: +BM 06/09, good appetite, no nausea   Renal: UO 750cc/24 hrs reported. Pt reports he is urinating regularly since foley removal. On Flomax. +2lbs from preop. Will give one dose of Lasix this AM.   Expected postop ABLA: Last H/H 8.3/25.7 stable, updated labs pending.   Dispo: Work on weaning oxygen to baseline 3L and ambulating. Hopefully can d/c to home later today.    LOS: 9 days    Christopher Reichmann, PA-C 11/13/2022   Chart reviewed, patient examined, agree with above. He feels well after pacer yesterday.  CXR looks ok with very small pleural effusions and mild bibasilar atelectasis. He is ambulating ok.  Will remove temporary pacer wires this am and plan home later today if no changes.

## 2022-11-13 NOTE — Progress Notes (Addendum)
Epicardial pacing wires and chest tube sutures removed by Hurley Cisco, RN. Patient educated to remain in bed x1 hour, verbalized understanding.

## 2022-11-14 ENCOUNTER — Telehealth: Payer: Self-pay

## 2022-11-14 ENCOUNTER — Ambulatory Visit: Payer: BC Managed Care – PPO | Admitting: Internal Medicine

## 2022-11-14 ENCOUNTER — Other Ambulatory Visit: Payer: Self-pay | Admitting: Internal Medicine

## 2022-11-14 MED ORDER — ASPIRIN 81 MG PO TBEC: 81.0000 mg | DELAYED_RELEASE_TABLET | Freq: Every day | ORAL | Status: AC

## 2022-11-14 MED ORDER — DIGOXIN 125 MCG PO TABS: 0.1250 mg | ORAL_TABLET | Freq: Every day | ORAL | 1 refills | Status: DC

## 2022-11-14 MED ORDER — OXYCODONE HCL 5 MG PO TABS: 5.0000 mg | ORAL_TABLET | Freq: Four times a day (QID) | ORAL | 0 refills | Status: DC | PRN

## 2022-11-14 MED ORDER — TAMSULOSIN HCL 0.4 MG PO CAPS: 0.4000 mg | ORAL_CAPSULE | Freq: Every day | ORAL | 1 refills | Status: DC

## 2022-11-14 MED ORDER — FE FUM-VIT C-VIT B12-FA 460-60-0.01-1 MG PO CAPS: 1.0000 | ORAL_CAPSULE | Freq: Every day | ORAL | 0 refills | Status: DC

## 2022-11-14 NOTE — Progress Notes (Signed)
Occupational Therapy Treatment Patient Details Name: Christopher Arias MRN: [id number] DOB: 02/11/1961 Today's Date: 11/14/2022   History of present illness Pt is a 62 yo male admitted for AVR on 6/3 with median sternotomy due to aortic insufficiency.  Pt was returned to OR same day for reexploration and removal of hematoma. Pt with episodes of complete heart block after surgery therefore PPM was performed.  PMH: aortic insuffiency.   OT comments  Reinforced sternal and pacemaker precautions during ADLs and IADLs to avoid. Provided written handouts. Pt verbalized/demonstrated understanding. He has excellent support of his wife and neighbors. Pt is eager to discharge home. He reports a walking plan for regaining his endurance in his home and avoiding heat.    Recommendations for follow up therapy are one component of a multi-disciplinary discharge planning process, led by the attending physician.  Recommendations may be updated based on patient status, additional functional criteria and insurance authorization.    Assistance Recommended at Discharge Intermittent Supervision/Assistance  Patient can return home with the following  A little help with bathing/dressing/bathroom;Assist for transportation   Equipment Recommendations  None recommended by OT    Recommendations for Other Services      Precautions / Restrictions Precautions Precautions: ICD/Pacemaker;Sternal Precaution Booklet Issued: Yes (comment) Precaution Comments: provided both sternal and pacemaker handouts Required Braces or Orthoses: Sling Restrictions Weight Bearing Restrictions: Yes RUE Weight Bearing: Non weight bearing LUE Weight Bearing: Non weight bearing       Mobility Bed Mobility Overal bed mobility: Modified Independent             General bed mobility comments: up to R side of bed adhering to precautions    Transfers Overall transfer level: Modified independent Equipment used: None                General transfer comment: able to stand without UE use     Balance                                           ADL either performed or assessed with clinical judgement   ADL Overall ADL's : Modified independent                   Upper Body Dressing Details (indicate cue type and reason): educated to dress L UE first, undress last, benefits of front opening shirt   Lower Body Dressing Details (indicate cue type and reason): educated to pull pants up with R hand Toilet Transfer: Ambulation;Comfort height toilet;Modified Independent     Toileting - Clothing Manipulation Details (indicate cue type and reason): educated to use R hand for pericare, L UE in front     Functional mobility during ADLs: Modified independent      Extremity/Trunk Assessment              Vision       Perception     Praxis      Cognition Arousal/Alertness: Awake/alert Behavior During Therapy: WFL for tasks assessed/performed Overall Cognitive Status: Within Functional Limits for tasks assessed                                 General Comments: pt wearing sling at night consistently        Exercises      Shoulder Instructions  General Comments      Pertinent Vitals/ Pain       Pain Assessment Pain Assessment: No/denies pain  Home Living                                          Prior Functioning/Environment              Frequency           Progress Toward Goals  OT Goals(current goals can now be found in the care plan section)  Progress towards OT goals: Goals met/education completed, patient discharged from OT     Plan All goals met and education completed, patient discharged from OT services    Co-evaluation                 AM-PAC OT "6 Clicks" Daily Activity     Outcome Measure   Help from another person eating meals?: None Help from another person taking care of personal grooming?:  None Help from another person toileting, which includes using toliet, bedpan, or urinal?: None Help from another person bathing (including washing, rinsing, drying)?: None Help from another person to put on and taking off regular upper body clothing?: None Help from another person to put on and taking off regular lower body clothing?: None 6 Click Score: 24    End of Session Equipment Utilized During Treatment: Oxygen  OT Visit Diagnosis: Muscle weakness (generalized) (M62.81)   Activity Tolerance Patient tolerated treatment well   Patient Left in bed;with call bell/phone within reach   Nurse Communication          Time: 4540-9811 OT Time Calculation (min): 21 min  Charges: OT General Charges $OT Visit: 1 Visit OT Treatments $Self Care/Home Management : 8-22 mins  Evern Bio 11/14/2022, 10:28 AM Berna Spare, OTR/L Acute Rehabilitation Services Office: [phone]

## 2022-11-14 NOTE — TOC Transition Note (Signed)
Transition of Care (TOC) - CM/SW Discharge Note Donn Pierini RN, BSN Transitions of Care Unit 4E- RN Case Manager See Treatment Team for direct phone #   Patient Details  Name: HILDA WEXLER MRN: [id number] Date of Birth: 28-Sep-1960  Transition of Care [facility]) CM/SW Contact:  Darrold Span, RN Phone Number: 11/14/2022, 2:51 PM   Clinical Narrative:    Pt stable for transition home today, has home 02 portable tank at the bedside for transport home. (Baseline 02-3L) Per pt he has RW at home- no other DME needs noted.   Family to transport home- no Further TOC needs noted.    Final next level of care: Home/Self Care Barriers to Discharge: No Barriers Identified   Patient Goals and CMS Choice CMS Medicare.gov Compare Post Acute Care list provided to:: Patient Choice offered to / list presented to : NA  Discharge Placement               Home          Discharge Plan and Services Additional resources added to the After Visit Summary for       Post Acute Care Choice: NA                               Social Determinants of Health (SDOH) Interventions SDOH Screenings   Food Insecurity: No Food Insecurity (11/04/2022)  Housing: Low Risk  (11/04/2022)  Transportation Needs: No Transportation Needs (11/04/2022)  Utilities: Not At Risk (11/04/2022)  Depression (PHQ2-9): Low Risk  (05/14/2022)  Tobacco Use: Medium Risk (11/13/2022)     Readmission Risk Interventions     No data to display

## 2022-11-14 NOTE — Progress Notes (Signed)
Offered to ambulate pt however he declined stating he's preparing to get ready to go home. Pt appreciates care and plans to join CR when able.   Faustino Congress MS, ACSM-CEP 11/14/2022 9:52 AM

## 2022-11-14 NOTE — Telephone Encounter (Signed)
Attempted to contact patient for follow up. No answer, LMTCB.

## 2022-11-14 NOTE — Progress Notes (Signed)
This nurse was asked by Luster Landsberg, PA to notified her of discharge to remove patient's PPM dressing. Notified via Amion by this nurse. Patient became very anxious and demanding to leave prior to Renee's arrival. Patient was educated about wanting for her to arrive, but continues to want to leave. IV and telemetry removed. Discharge instructions reviewed with patient, verbalized understanding. Patient transported to private vehicle via wheelchair and home oxygen.

## 2022-11-14 NOTE — Progress Notes (Addendum)
      [address]       [name] [address]             [phone]      2 Days Post-Op Procedure(s) (LRB): PACEMAKER IMPLANT (N/A) Subjective: Pt with no new complaints  Objective: Vital signs in last 24 hours: Temp:  [97.6 F (36.4 C)-98.8 F (37.1 C)] 98 F (36.7 C) (06/13 0406) Pulse Rate:  [92-116] 100 (06/13 0406) Cardiac Rhythm: Sinus tachycardia (06/12 2036) Resp:  [15-20] 20 (06/13 0406) BP: (94-122)/(70-91) 122/91 (06/13 0406) SpO2:  [91 %-97 %] 92 % (06/13 0406) Weight:  [77.2 kg] 77.2 kg (06/13 0406)  Hemodynamic parameters for last 24 hours:    Intake/Output from previous day: 06/12 0701 - 06/13 0700 In: -  Out: 300 [Urine:300] Intake/Output this shift: No intake/output data recorded.  General appearance: alert, cooperative, and no distress Neurologic: intact Heart: sinus tachycardia, PVCs, no murmur Lungs: some rhonchi, otherwise clear to auscultation Abdomen: soft, non-tender; bowel sounds normal; no masses,  no organomegaly Extremities: extremities normal, atraumatic, no cyanosis or edema Wound: Clean and dry, no erythema or sign of infection  Lab Results: Recent Labs    11/13/22 0834  WBC 10.7*  HGB 9.6*  HCT 29.8*  PLT 238   BMET:  Recent Labs    11/13/22 0834  NA 136  K 4.1  CL 101  CO2 25  GLUCOSE 140*  BUN 14  CREATININE 0.91  CALCIUM 8.4*    PT/INR: No results for input(s): "LABPROT", "INR" in the last 72 hours. ABG    Component Value Date/Time   PHART 7.324 (L) 11/04/2022 2300   HCO3 22.1 11/04/2022 2300   TCO2 23 11/04/2022 2300   ACIDBASEDEF 4.0 (H) 11/04/2022 2300   O2SAT 97 11/04/2022 2300   CBG (last 3)  No results for input(s): "GLUCAP" in the last 72 hours.  Assessment/Plan: S/P Procedure(s) (LRB): PACEMAKER IMPLANT (N/A)  CV: HX of CHB, s/p PPM. Sinus/atrial tachycardia with PVCs, HR 100-110s. SBP 109-120. Discussed tachycardia with Dr. Laneta Simmers and EP yesterday, started Digoxin for HR  control. Holding BB due to soft BP.   Pulm: Baseline requires 3L [place] oxygen at home. Saturating well on 4L [place]. Wean as tolerated. CXR with atelectasis. Continue nebulizers, IS and ambulation.    GI: +BM 06/09, good appetite, no nausea   Renal: Cr stable 0.91. Pt reports he is urinating regularly since foley removal. On Flomax. Below preop weight.   Expected postop ABLA: H/H 9.6/29.8 improved   Dispo: D/C to home today.   LOS: 10 days    Jenny Reichmann, PA-C 11/14/2022   Chart reviewed, patient examined, agree with above. He feels well. Rhythm is sinus tach. I think he can go home on digoxin. I think his sinus tach will come down as his Hgb improves and oxygenation improves.

## 2022-11-14 NOTE — Telephone Encounter (Signed)
-----   Message from [facility] [place], [place] sent at 11/14/2022 11:52 AM EDT ----- This patient got a PPM 6/11 with WC We did not remove his tegaderm yesterday because he wasn't going home  He discharged today but wouldn't stay for me to get there and remove his tegaderm dressing Can you guys please follow up with him today?  Thanks  renee

## 2022-11-15 ENCOUNTER — Ambulatory Visit: Payer: BC Managed Care – PPO | Admitting: Internal Medicine

## 2022-11-15 ENCOUNTER — Telehealth: Payer: Self-pay

## 2022-11-15 ENCOUNTER — Telehealth: Payer: Self-pay | Admitting: Internal Medicine

## 2022-11-15 NOTE — Telephone Encounter (Signed)
Already done earlier today

## 2022-11-15 NOTE — Telephone Encounter (Signed)
Prescription Request  11/15/2022  LOV: 05/21/2022  What is the name of the medication or equipment? clonazePAM (KLONOPIN) 1 MG tablet [   Have you contacted your pharmacy to request a refill? No   Which pharmacy would you like this sent to?  [id number] - [place], [place] - [address] AT [facility] [address] [place] [address] Phone: [phone] Fax: [phone]    Patient notified that their request is being sent to the clinical staff for review and that they should receive a response within 2 business days.   Please advise at Mobile [phone] (mobile)

## 2022-11-15 NOTE — Transitions of Care (Post Inpatient/ED Visit) (Signed)
11/15/2022  Name: Christopher Arias MRN: [id number] DOB: 09/18/1960  Today's TOC FU Call Status: Today's TOC FU Call Status:: Successful TOC FU Call Competed TOC FU Call Complete Date: 11/15/22 (Return call from pt returning RN CM call.)  Transition Care Management Follow-up Telephone Call Date of Discharge: 11/14/22 Discharge Facility: [facility]) Type of Discharge: Inpatient Admission Primary Inpatient Discharge Diagnosis:: "s/p AVR" How have you been since you were released from the hospital?: Better (Pt voices he is "doing well-pain is minimal-has only taken two pain pills since coming home." He has been up walking and moving around-getting ready to go run some errands. Appetite good. BM this AM.States incision looks good.) Any questions or concerns?: No  Items Reviewed: Did you receive and understand the discharge instructions provided?: Yes Medications obtained,verified, and reconciled?: Yes (Medications Reviewed) Any new allergies since your discharge?: No Dietary orders reviewed?: Yes Type of Diet Ordered:: low slat/heart healthy Do you have support at home?: Yes People in Home: spouse  Medications Reviewed Today: Medications Reviewed Today     Reviewed by Charlyn Minerva, RN (Registered Nurse) on 11/15/22 at 1343  Med List Status: <None>   Medication Order Taking? Sig Documenting Provider Last Dose Status Informant  albuterol (PROVENTIL) (2.5 MG/3ML) 0.083% nebulizer solution [id number] Yes Take 3 mLs (2.5 mg total) by nebulization every 6 (six) hours as needed for wheezing or shortness of breath. Charlott Holler, MD Taking Active Self, Pharmacy Records  albuterol (VENTOLIN HFA) 108 [phone] Base) MCG/ACT inhaler [id number] Yes Inhale 2 puffs into the lungs every 6 (six) hours as needed for wheezing or shortness of breath. Glenford Bayley, NP Taking Active Self, Pharmacy Records  aspirin EC 81 MG tablet [id number] Yes Take 1 tablet (81 mg total) by mouth daily.  Swallow whole. Stehler, Oren Bracket, PA-C Taking Active   atorvastatin (LIPITOR) 20 MG tablet [id number] Yes Take 1 tablet (20 mg total) by mouth daily. Corwin Levins, MD Taking Active Self, Pharmacy Records  Budeson-Glycopyrrol-Formoterol [facility] AEROSPHERE) 160-9-4.8 MCG/ACT Sandrea Matte [id number] Yes Inhale 2 puffs into the lungs in the morning and at bedtime. Glenford Bayley, NP Taking Active Self, Pharmacy Records  clonazePAM [facility]) 1 MG tablet [id number] Yes TAKE 1 TABLET BY MOUTH TWICE A DAY AS NEEDED Corwin Levins, MD Taking Active   digoxin (LANOXIN) 0.125 MG tablet [id number] Yes Take 1 tablet (0.125 mg total) by mouth daily. Stehler, Oren Bracket, PA-C Taking Active   diphenhydramine-acetaminophen (TYLENOL PM) 25-500 MG TABS tablet [id number] Yes Take 2 tablets by mouth at bedtime. [provider] Taking Active Self, Pharmacy Records  Fe Fum-Vit C-Vit B12-FA (TRIGELS-F FORTE) CAPS capsule [id number] Yes Take 1 capsule by mouth daily after breakfast. Jenny Reichmann, PA-C Taking Active   levothyroxine (SYNTHROID) 100 MCG tablet [id number] Yes Take 1 tablet (100 mcg total) by mouth daily. Corwin Levins, MD Taking Active Self, Pharmacy Records  oxyCODONE (OXY IR/ROXICODONE) 5 MG immediate release tablet [id number] Yes Take 1 tablet (5 mg total) by mouth every 6 (six) hours as needed for severe pain. Laurell Roof Taking Active   OXYGEN [id number] Yes Inhale 2-3 L into the lungs at bedtime. As needed during the day [provider] Taking Active Self, Pharmacy Records  sertraline (ZOLOFT) 100 MG tablet [id number] Yes Take 2 tablets (200 mg total) by mouth daily.  Patient taking differently: Take 200 mg by mouth at bedtime.   Corwin Levins, MD Taking Active Self, Pharmacy Records  tamsulosin [facility])  0.4 MG CAPS capsule [id number] Yes Take 1 capsule (0.4 mg total) by mouth daily. Jenny Reichmann, PA-C Taking Active   varenicline (CHANTIX) 1 MG tablet [id number] Yes TAKE 1 TABLET BY  MOUTH TWICE A DAY  Patient taking differently: Take 1 mg by mouth daily.   Coralyn Helling, MD Taking Active Self, Pharmacy Records            Home Care and Equipment/Supplies: Were Home Health Services Ordered?: NA Any new equipment or medical supplies ordered?: NA  Functional Questionnaire: Do you need assistance with bathing/showering or dressing?: No Do you need assistance with meal preparation?: No Do you need assistance with eating?: No Do you have difficulty maintaining continence: No Do you need assistance with getting out of bed/getting out of a chair/moving?: No Do you have difficulty managing or taking your medications?: No  Follow up appointments reviewed: PCP Follow-up appointment confirmed?: Yes Date of PCP follow-up appointment?: 11/20/22 Follow-up Provider: Dr. Jonny Ruiz Specialist [facility] Follow-up appointment confirmed?: Yes Date of Specialist follow-up appointment?: 11/27/22 Follow-Up Specialty Provider:: Dr. Bjorn Pippin Do you need transportation to your follow-up appointment?: No (pt confirms he has someone to get to him to appts until cleared to resume driving) Do you understand care options if your condition(s) worsen?: Yes-patient verbalized understanding  SDOH Interventions Today    Flowsheet Row Most Recent Value  SDOH Interventions   Food Insecurity Interventions Intervention Not Indicated      TOC Interventions Today    Flowsheet Row Most Recent Value  TOC Interventions   TOC Interventions Discussed/Reviewed TOC Interventions Discussed, Post op wound/incision care, Post discharge activity limitations per provider, S/S of infection, Arranged PCP follow up within 7 days/Care Guide scheduled      Interventions Today    Flowsheet Row Most Recent Value  General Interventions   General Interventions Discussed/Reviewed General Interventions Discussed, Doctor Visits  Doctor Visits Discussed/Reviewed Doctor Visits Discussed, Specialist, PCP   PCP/Specialist Visits Compliance with follow-up visit  Education Interventions   Education Provided Provided Education  Provided Verbal Education On Nutrition, When to see the doctor, Medication, Other  [pain mgmt/sx mgmt]  Nutrition Interventions   Nutrition Discussed/Reviewed Nutrition Discussed, Adding fruits and vegetables, Decreasing salt  Pharmacy Interventions   Pharmacy Dicussed/Reviewed Pharmacy Topics Discussed, Medications and their functions  Safety Interventions   Safety Discussed/Reviewed Safety Discussed       Alessandra Grout [facility] Care Management Care Management Community Coordinator Direct Phone: [phone] Toll Free: [phone] Fax: [phone]

## 2022-11-15 NOTE — Transitions of Care (Post Inpatient/ED Visit) (Signed)
   11/15/2022  Name: Christopher Arias MRN: [id number] DOB: 12/21/1960  Today's TOC FU Call Status: Today's TOC FU Call Status:: Unsuccessul Call (1st Attempt) Unsuccessful Call (1st Attempt) Date: 11/15/22  Attempted to reach the patient regarding the most recent Inpatient/ED visit.  Follow Up Plan: Additional outreach attempts will be made to reach the patient to complete the Transitions of Care (Post Inpatient/ED visit) call.     Antionette Fairy, RN,BSN,CCM [facility] Care Management Care Management Community Coordinator Direct Phone: [phone] Toll Free: [phone] Fax: [phone]

## 2022-11-15 NOTE — Telephone Encounter (Signed)
Pt returned nurse call but was disconnected from the scheduler.

## 2022-11-15 NOTE — Telephone Encounter (Signed)
Spoke with Pt.  Advised to remove outer tegaderm.    Advised steristrips will stay in place until wound check.  Pt plans to shower but will cover steristrips to ensure they stay dry.  All questions answered.

## 2022-11-19 MED FILL — Heparin Sodium (Porcine) Inj 1000 Unit/ML: INTRAMUSCULAR | Qty: 30 | Status: AC

## 2022-11-19 MED FILL — Sodium Chloride IV Soln 0.9%: INTRAVENOUS | Qty: 1000 | Status: AC

## 2022-11-20 ENCOUNTER — Ambulatory Visit (INDEPENDENT_AMBULATORY_CARE_PROVIDER_SITE_OTHER): Payer: BC Managed Care – PPO | Admitting: Internal Medicine

## 2022-11-20 ENCOUNTER — Encounter: Payer: Self-pay | Admitting: Internal Medicine

## 2022-11-20 VITALS — BP 120/78 | HR 103 | Temp 97.8°F | Ht 65.0 in | Wt 168.0 lb

## 2022-11-20 DIAGNOSIS — R110 Nausea: Secondary | ICD-10-CM | POA: Insufficient documentation

## 2022-11-20 DIAGNOSIS — J9610 Chronic respiratory failure, unspecified whether with hypoxia or hypercapnia: Secondary | ICD-10-CM | POA: Insufficient documentation

## 2022-11-20 DIAGNOSIS — Z950 Presence of cardiac pacemaker: Secondary | ICD-10-CM | POA: Diagnosis not present

## 2022-11-20 DIAGNOSIS — Z952 Presence of prosthetic heart valve: Secondary | ICD-10-CM

## 2022-11-20 DIAGNOSIS — J9611 Chronic respiratory failure with hypoxia: Secondary | ICD-10-CM

## 2022-11-20 MED ORDER — ATORVASTATIN CALCIUM 20 MG PO TABS: 20.0000 mg | ORAL_TABLET | Freq: Every day | ORAL | 3 refills | Status: DC

## 2022-11-20 MED ORDER — SERTRALINE HCL 100 MG PO TABS: 200.0000 mg | ORAL_TABLET | Freq: Every day | ORAL | 3 refills | Status: DC

## 2022-11-20 MED ORDER — ONDANSETRON 4 MG PO TBDP: 4.0000 mg | ORAL_TABLET | Freq: Three times a day (TID) | ORAL | 1 refills | Status: DC | PRN

## 2022-11-20 MED ORDER — LEVOTHYROXINE SODIUM 100 MCG PO TABS: 100.0000 ug | ORAL_TABLET | Freq: Every day | ORAL | 3 refills | Status: DC

## 2022-11-20 NOTE — Assessment & Plan Note (Signed)
For f/u PPM check soon

## 2022-11-20 NOTE — Assessment & Plan Note (Signed)
Mild post hospn, exam benign, for zofran prn,  to f/u any worsening symptoms or concerns

## 2022-11-20 NOTE — Assessment & Plan Note (Signed)
To continue home o2, to f/u pulm soon, hopefully to eventually wean off home o2

## 2022-11-20 NOTE — Patient Instructions (Addendum)
Please take all new medication as prescribed - the zofran as needed for nausea  Please continue all other medications as before, and refills have been done if requested.  Please have the pharmacy call with any other refills you may need.  Please continue your efforts at being more active, low cholesterol diet, and weight control.  You are otherwise up to date with prevention measures today.  Please keep your appointments with your specialists as you may have planned - cardiology, chest surgury, pulmonary and pacemaker check as planned  We can hold on lab testing today  Please make an Appointment to return in 6 months, or sooner if needed

## 2022-11-20 NOTE — Assessment & Plan Note (Signed)
Overall doing well, to f/u chest surgury and cardiology soon as planned

## 2022-11-20 NOTE — Progress Notes (Signed)
Patient ID: Christopher Arias, male   DOB: 1960/07/08, 62 y.o.   MRN: [id number]        Chief Complaint: follow up post hospn 6/3 - 6/13        HPI:  Christopher Arias is a 62 y.o. male here with hx of RSV pna and chronic resp failure on home o2 since dec 2023, required hospn as above with severe AI requiring tissue valve replacement, complicated by postop bleeding and transfusion, as well as type 2 heart block requiring PPM placement.  Pt has been home since June 13, with appetite and stamina now coming back in the last 2-3 days.  Course also compicated by urinary retention, but catheter out prior to d/c and doing well on flomax.  Denies urinary symptoms such as dysuria, frequency, urgency, flank pain, hematuria or n/v, fever, chills.  No fever, chills, cough and Pt denies chest pain, increased sob or doe, wheezing, orthopnea, PND, increased LE swelling, palpitations, dizziness or syncope.  Does have some pedal swelling persistent but no worse.  Remains on home o2 and has mulitple planned f/u appts including cardiology, pulm, chest surgury and PPM check in the next 2 wks.  Pt otherwise denies any new complaints except mild intermittent nausea.  Transitional Care Management elements noted today: 1)  Date of D/C: as above 2)  Medication reconciliation:  done today at end visit 3)  Review of D/C summary or other information:  done today 4)  Review of need for f/u on pending diagnostic tests and treatments:  done today - none needed 5)  Review of need for Interaction with other providers who will assume or resume care of pt specific problems: done today - appts scheduled 6)  Education of patient/family/guardian or caregiver: done today - wife present       Wt Readings from Last 3 Encounters:  11/20/22 168 lb (76.2 kg)  11/14/22 170 lb 4.8 oz (77.2 kg)  10/31/22 171 lb 9.6 oz (77.8 kg)   BP Readings from Last 3 Encounters:  11/20/22 120/78  11/14/22 105/83  10/31/22 (!) 140/87         Past Medical  History:  Diagnosis Date   ANTERIOR PITUITARY HYPERFUNCTION 11/23/2009   ANXIETY 11/24/2007   COPD (chronic obstructive pulmonary disease) (HCC) 05/31/2021   GERD 11/24/2007   GYNECOMASTIA 11/10/2009   HYPERLIPIDEMIA 11/24/2007   HYPOTHYROIDISM, POST-RADIATION 03/16/2010   PONV (postoperative nausea and vomiting)    Severe aortic insufficiency    Past Surgical History:  Procedure Laterality Date   AORTIC VALVE REPLACEMENT N/A 11/04/2022   Procedure: AORTIC VALVE REPLACEMENT (AVR) USING INSPIRIS RESILIA 27 MM AORTIC VALVE;  Surgeon: Alleen Borne, MD;  Location: [facility] OR;  Service: Open Heart Surgery;  Laterality: N/A;  Median sternotomy   EXPLORATION POST OPERATIVE OPEN HEART N/A 11/04/2022   Procedure: EXPLORATION POST OPERATIVE OPEN HEART;  Surgeon: Alleen Borne, MD;  Location: [facility] OR;  Service: Open Heart Surgery;  Laterality: N/A;   HEMORRHOID SURGERY     INGUINAL HERNIA REPAIR Bilateral    PACEMAKER IMPLANT N/A 11/12/2022   Procedure: PACEMAKER IMPLANT;  Surgeon: Regan Lemming, MD;  Location: [facility] INVASIVE CV LAB;  Service: Cardiovascular;  Laterality: N/A;   RIGHT/LEFT HEART CATH AND CORONARY ANGIOGRAPHY N/A 08/20/2022   Procedure: RIGHT/LEFT HEART CATH AND CORONARY ANGIOGRAPHY;  Surgeon: Marykay Lex, MD;  Location: [facility] INVASIVE CV LAB;  Service: Cardiovascular;  Laterality: N/A;   TEE WITHOUT CARDIOVERSION N/A 08/01/2022   Procedure: TRANSESOPHAGEAL  ECHOCARDIOGRAM (TEE);  Surgeon: Wendall Stade, MD;  Location: [facility] ENDOSCOPY;  Service: Cardiovascular;  Laterality: N/A;   TEE WITHOUT CARDIOVERSION N/A 11/04/2022   Procedure: TRANSESOPHAGEAL ECHOCARDIOGRAM;  Surgeon: Alleen Borne, MD;  Location: [facility] OR;  Service: Open Heart Surgery;  Laterality: N/A;    reports that he quit smoking about 14 months ago. His smoking use included cigarettes. He has a 20.00 pack-year smoking history. He has never used smokeless tobacco. He reports current alcohol use of about 1.0 standard drink of  alcohol per week. He reports that he does not use drugs. family history includes COPD in an other family member; Heart disease in his father and another family member; Stroke in an other family member. Allergies  Allergen Reactions   Atenolol Other (See Comments)    Nose bleeds   Current Outpatient Medications on File Prior to Visit  Medication Sig Dispense Refill   albuterol (PROVENTIL) (2.5 MG/3ML) 0.083% nebulizer solution Take 3 mLs (2.5 mg total) by nebulization every 6 (six) hours as needed for wheezing or shortness of breath. 75 mL 12   albuterol (VENTOLIN HFA) 108 (90 Base) MCG/ACT inhaler Inhale 2 puffs into the lungs every 6 (six) hours as needed for wheezing or shortness of breath. 18 g 3   aspirin EC 81 MG tablet Take 1 tablet (81 mg total) by mouth daily. Swallow whole.     Budeson-Glycopyrrol-Formoterol (BREZTRI AEROSPHERE) 160-9-4.8 MCG/ACT AERO Inhale 2 puffs into the lungs in the morning and at bedtime. 10.7 g 5   clonazePAM (KLONOPIN) 1 MG tablet TAKE 1 TABLET BY MOUTH TWICE A DAY AS NEEDED 60 tablet 2   digoxin (LANOXIN) 0.125 MG tablet Take 1 tablet (0.125 mg total) by mouth daily. 30 tablet 1   diphenhydramine-acetaminophen (TYLENOL PM) 25-500 MG TABS tablet Take 2 tablets by mouth at bedtime.     Fe Fum-Vit C-Vit B12-FA (TRIGELS-F FORTE) CAPS capsule Take 1 capsule by mouth daily after breakfast.  0   oxyCODONE (OXY IR/ROXICODONE) 5 MG immediate release tablet Take 1 tablet (5 mg total) by mouth every 6 (six) hours as needed for severe pain. 28 tablet 0   OXYGEN Inhale 2-3 L into the lungs at bedtime. As needed during the day     tamsulosin (FLOMAX) 0.4 MG CAPS capsule Take 1 capsule (0.4 mg total) by mouth daily. 30 capsule 1   varenicline (CHANTIX) 1 MG tablet TAKE 1 TABLET BY MOUTH TWICE A DAY (Patient taking differently: Take 1 mg by mouth daily.) 180 tablet 2   No current facility-administered medications on file prior to visit.        ROS:  All others reviewed and  negative.  Objective        PE:  BP 120/78 (BP Location: Right Arm, Patient Position: Sitting, Cuff Size: Normal)   Pulse (!) 103   Temp 97.8 F (36.6 C) (Oral)   Ht 5\' 5"  (1.651 m)   Wt 168 lb (76.2 kg)   SpO2 96%   BMI 27.96 kg/m                 Constitutional: Pt appears in NAD               HENT: Head: NCAT.                Right Ear: External ear normal.                 Left Ear: External ear normal.  Eyes: . Pupils are equal, round, and reactive to light. Conjunctivae and EOM are normal               Nose: without d/c or deformity               Neck: Neck supple. Gross normal ROM               Cardiovascular: Normal rate and regular rhythm.                 Pulmonary/Chest: Effort normal and breath sounds without rales or wheezing.                Abd:  Soft, NT, ND, + BS, no organomegaly               Neurological: Pt is alert. At baseline orientation, motor grossly intact               Skin: Skin is warm. No rashes, no other new lesions, LE edema - none               Psychiatric: Pt behavior is normal without agitation   Micro: none  Cardiac tracings I have personally interpreted today:  none  Pertinent Radiological findings (summarize): none   Lab Results  Component Value Date   WBC 10.7 (H) 11/13/2022   HGB 9.6 (L) 11/13/2022   HCT 29.8 (L) 11/13/2022   PLT 238 11/13/2022   GLUCOSE 140 (H) 11/13/2022   CHOL 156 05/21/2022   TRIG 89.0 05/21/2022   HDL 60.50 05/21/2022   LDLCALC 77 05/21/2022   ALT 26 10/31/2022   AST 19 10/31/2022   NA 136 11/13/2022   K 4.1 11/13/2022   CL 101 11/13/2022   CREATININE 0.91 11/13/2022   BUN 14 11/13/2022   CO2 25 11/13/2022   TSH 15.96 (H) 05/21/2022   PSA 8.84 (H) 05/21/2022   INR 1.6 (H) 11/04/2022   HGBA1C 5.7 (H) 10/31/2022   Assessment/Plan:  KIYA ELEK is a 62 y.o. White or Caucasian [1] male with  has a past medical history of ANTERIOR PITUITARY HYPERFUNCTION (11/23/2009), ANXIETY (11/24/2007),  COPD (chronic obstructive pulmonary disease) (HCC) (05/31/2021), GERD (11/24/2007), GYNECOMASTIA (11/10/2009), HYPERLIPIDEMIA (11/24/2007), HYPOTHYROIDISM, POST-RADIATION (03/16/2010), PONV (postoperative nausea and vomiting), and Severe aortic insufficiency.  S/P AVR (aortic valve replacement) Overall doing well, to f/u chest surgury and cardiology soon as planned  Pacemaker For f/u PPM check soon  Chronic respiratory failure (HCC) To continue home o2, to f/u pulm soon, hopefully to eventually wean off home o2  Nausea Mild post hospn, exam benign, for zofran prn,  to f/u any worsening symptoms or concerns  Followup: Return in about 6 months (around 05/22/2023).  Oliver Barre, MD 11/20/2022 10:28 AM [facility]

## 2022-11-21 ENCOUNTER — Telehealth (HOSPITAL_COMMUNITY): Payer: Self-pay

## 2022-11-21 NOTE — Telephone Encounter (Signed)
Attempted to call patient in regards to Cardiac Rehab - LM on VM 

## 2022-11-24 NOTE — Progress Notes (Unsigned)
Cardiology Office Note:    Date:  11/27/2022   ID:  Christopher Arias, DOB 01/01/1961, MRN [id number]  PCP:  Corwin Levins, MD  Cardiologist:  Little Ishikawa, MD  Electrophysiologist:  None   Referring MD: Corwin Levins, MD   Chief Complaint  Patient presents with   Cardiac Valve Problem    History of Present Illness:    Christopher Arias is a 62 y.o. male with a hx of COPD on home 3L, hypothyroidism, hyperlipidemia, OSA, rectus sheath hematoma who presents for follow-up.  He was referred for evaluation of possible atrial flutter, initially seen 07/15/2022.  EKG 06/21/2022 read as atrial flutter but appears sinus rhythm.  Echocardiogram 07/11/2022 showed EF 60 to 65%, grade 1 diastolic dysfunction, normal RV function, mild to moderate AI, possible small echodensity on aortic valve on subcostal images.  He reports he has been having shortness of breath since his hospitalization in December 2023 with acute hypoxic respiratory failure in setting of RSV infection.  Has been slowly improving.  Denies any chest pain, lightheadedness, syncope, lower extremity edema, or palpitations.  Smoked 43 years, about 0.5ppd, quit age 62.  Father died of MI at 8.   TEE on 24-Aug-2022 showed EF 60 to 65%, mild LV dilatation, normal RV function, mild left atrial enlargement, bicuspid aortic valve with severe aortic regurgitation, dilated aortic root measuring 40 mm.  LHC/RHC on 08/20/2022 showed no significant CAD, normal filling pressures.  Referred to cardiothoracic surgery, underwent aortic valve replacement with 27 mm Edwards Inspiris Resilia valve with Dr. Laneta Simmers on 11/04/2022.  Developed complete heart block after procedure, underwent PPM placement 11/12/2022.  Also had atrial tachycardia, started on digoxin.  Since last clinic visit, he reports he is doing okay.  Denies any chest pain.  Does report some dyspnea.  Has been having significant issues with nosebleeds recently. Denies any  lightheadedness, syncope, lower  extremity edema, or palpitations.    Past Medical History:  Diagnosis Date   ANTERIOR PITUITARY HYPERFUNCTION 11/23/2009   ANXIETY 11/24/2007   COPD (chronic obstructive pulmonary disease) (HCC) 05/31/2021   GERD 11/24/2007   GYNECOMASTIA 11/10/2009   HYPERLIPIDEMIA 11/24/2007   HYPOTHYROIDISM, POST-RADIATION 03/16/2010   PONV (postoperative nausea and vomiting)    Severe aortic insufficiency     Past Surgical History:  Procedure Laterality Date   AORTIC VALVE REPLACEMENT N/A 11/04/2022   Procedure: AORTIC VALVE REPLACEMENT (AVR) USING INSPIRIS RESILIA 27 MM AORTIC VALVE;  Surgeon: Alleen Borne, MD;  Location: [facility] OR;  Service: Open Heart Surgery;  Laterality: N/A;  Median sternotomy   EXPLORATION POST OPERATIVE OPEN HEART N/A 11/04/2022   Procedure: EXPLORATION POST OPERATIVE OPEN HEART;  Surgeon: Alleen Borne, MD;  Location: [facility] OR;  Service: Open Heart Surgery;  Laterality: N/A;   HEMORRHOID SURGERY     INGUINAL HERNIA REPAIR Bilateral    PACEMAKER IMPLANT N/A 11/12/2022   Procedure: PACEMAKER IMPLANT;  Surgeon: Regan Lemming, MD;  Location: [facility] INVASIVE CV LAB;  Service: Cardiovascular;  Laterality: N/A;   RIGHT/LEFT HEART CATH AND CORONARY ANGIOGRAPHY N/A 08/20/2022   Procedure: RIGHT/LEFT HEART CATH AND CORONARY ANGIOGRAPHY;  Surgeon: Marykay Lex, MD;  Location: [facility] INVASIVE CV LAB;  Service: Cardiovascular;  Laterality: N/A;   TEE WITHOUT CARDIOVERSION N/A 08-24-2022   Procedure: TRANSESOPHAGEAL ECHOCARDIOGRAM (TEE);  Surgeon: Wendall Stade, MD;  Location: [facility] ENDOSCOPY;  Service: Cardiovascular;  Laterality: N/A;   TEE WITHOUT CARDIOVERSION N/A 11/04/2022   Procedure: TRANSESOPHAGEAL ECHOCARDIOGRAM;  Surgeon: Laneta Simmers,  Payton Doughty, MD;  Location: [facility] OR;  Service: Open Heart Surgery;  Laterality: N/A;    Current Medications: Current Meds  Medication Sig   albuterol (PROVENTIL) (2.5 MG/3ML) 0.083% nebulizer solution Take 3 mLs (2.5 mg total) by nebulization every 6 (six)  hours as needed for wheezing or shortness of breath.   albuterol (VENTOLIN HFA) 108 (90 Base) MCG/ACT inhaler Inhale 2 puffs into the lungs every 6 (six) hours as needed for wheezing or shortness of breath.   aspirin EC 81 MG tablet Take 1 tablet (81 mg total) by mouth daily. Swallow whole.   atorvastatin (LIPITOR) 20 MG tablet Take 1 tablet (20 mg total) by mouth daily.   Budeson-Glycopyrrol-Formoterol (BREZTRI AEROSPHERE) 160-9-4.8 MCG/ACT AERO Inhale 2 puffs into the lungs in the morning and at bedtime.   clonazePAM (KLONOPIN) 1 MG tablet TAKE 1 TABLET BY MOUTH TWICE A DAY AS NEEDED   digoxin (LANOXIN) 0.125 MG tablet Take 1 tablet (0.125 mg total) by mouth daily.   diphenhydramine-acetaminophen (TYLENOL PM) 25-500 MG TABS tablet Take 2 tablets by mouth at bedtime.   levothyroxine (SYNTHROID) 100 MCG tablet Take 1 tablet (100 mcg total) by mouth daily.   metoprolol succinate (TOPROL-XL) 50 MG 24 hr tablet Take 1 tablet (50 mg total) by mouth daily. Take with or immediately following a meal.   ondansetron (ZOFRAN-ODT) 4 MG disintegrating tablet Take 1 tablet (4 mg total) by mouth every 8 (eight) hours as needed for nausea or vomiting.   oxyCODONE (OXY IR/ROXICODONE) 5 MG immediate release tablet Take 1 tablet (5 mg total) by mouth every 6 (six) hours as needed for severe pain.   OXYGEN Inhale 2-3 L into the lungs at bedtime. As needed during the day   sertraline (ZOLOFT) 100 MG tablet Take 2 tablets (200 mg total) by mouth daily.   tamsulosin (FLOMAX) 0.4 MG CAPS capsule Take 1 capsule (0.4 mg total) by mouth daily.   varenicline (CHANTIX) 1 MG tablet TAKE 1 TABLET BY MOUTH TWICE A DAY (Patient taking differently: Take 1 mg by mouth daily.)     Allergies:   Atenolol   Social History   Socioeconomic History   Marital status: Married    Spouse name: Not on file   Number of children: 0   Years of education: Not on file   Highest education level: Not on file  Occupational History     Employer: [employer]  Tobacco Use   Smoking status: Former    Packs/day: 1.00    Years: 20.00    Additional pack years: 0.00    Total pack years: 20.00    Types: Cigarettes    Quit date: 08/24/2021    Years since quitting: 1.2   Smokeless tobacco: Never  Vaping Use   Vaping Use: Never used  Substance and Sexual Activity   Alcohol use: Yes    Alcohol/week: 1.0 standard drink of alcohol    Types: 1 Cans of beer per week    Comment: one beer every 2 weeks, maybe 1 mixed drink once a month   Drug use: No   Sexual activity: Not on file  Other Topics Concern   Not on file  Social History Narrative   Not on file   Social Determinants of Health   Financial Resource Strain: Not on file  Food Insecurity: No Food Insecurity (11/15/2022)   Hunger Vital Sign    Worried About Running Out of Food in the Last Year: Never true    Ran Out of  Food in the Last Year: Never true  Transportation Needs: No Transportation Needs (11/15/2022)   PRAPARE - Administrator, Civil Service (Medical): No    Lack of Transportation (Non-Medical): No  Physical Activity: Not on file  Stress: Not on file  Social Connections: Not on file     Family History: The patient's family history includes COPD in an other family member; Heart disease in his father and another family member; Stroke in an other family member. There is no history of Colon cancer.  ROS:   Please see the history of present illness.     All other systems reviewed and are negative.  EKGs/Labs/Other Studies Reviewed:    The following studies were reviewed today:   EKG:   07/15/2022: Sinus tachycardia, rate 103, right axis deviation, poor R wave progression 08/13/22: Sinus tachycardia, rate 102, first-degree AV block, right axis deviation, Q waves in V1/2, poor R wave progression 11/27/2022: Sinus tachycardia, frequent PVCs, right axis deviation, incomplete right bundle branch block  Recent Labs: 05/10/2022: B Natriuretic Peptide  63.9 05/21/2022: TSH 15.96 10/31/2022: ALT 26 11/05/2022: Magnesium 2.0 11/13/2022: BUN 14; Creatinine, Ser 0.91; Hemoglobin 9.6; Platelets 238; Potassium 4.1; Sodium 136  Recent Lipid Panel    Component Value Date/Time   CHOL 156 05/21/2022 1628   TRIG 89.0 05/21/2022 1628   HDL 60.50 05/21/2022 1628   CHOLHDL 3 05/21/2022 1628   VLDL 17.8 05/21/2022 1628   LDLCALC 77 05/21/2022 1628    Physical Exam:    VS:  BP 100/66   Pulse (!) 116   Ht 5\' 4"  (1.626 m)   Wt 164 lb 3.2 oz (74.5 kg)   SpO2 95%   BMI 28.18 kg/m     Wt Readings from Last 3 Encounters:  11/27/22 164 lb 3.2 oz (74.5 kg)  11/20/22 168 lb (76.2 kg)  11/14/22 170 lb 4.8 oz (77.2 kg)     GEN:  Well nourished, well developed in no acute distress HEENT: Normal NECK: No JVD; No carotid bruits CARDIAC: RRR, no murmurs, rubs, gallops RESPIRATORY:  Clear to auscultation without rales, wheezing or rhonchi  ABDOMEN: Soft, non-tender, non-distended MUSCULOSKELETAL:  No edema; No deformity  SKIN: Warm and dry NEUROLOGIC:  Alert and oriented x 3 PSYCHIATRIC:  Normal affect   ASSESSMENT:    1. H/O aortic valve replacement   2. Pacemaker   3. Atrial tachycardia   4. Hyperlipidemia, unspecified hyperlipidemia type   5. Anemia, unspecified type   6. Encounter for monitoring digoxin therapy   7. Epistaxis   8. Medication management      PLAN:    Aortic regurgitation s/p AVR: Echocardiogram 07/11/2022 showed EF 60 to 65%, grade 1 diastolic dysfunction, normal RV function, mild to moderate AI, possible small echodensity on aortic valve.  Blood cultures were checked and were negative.  TEE on 08/01/2022 showed EF 60 to 65%, mild LV dilatation, normal RV function, mild left atrial enlargement, bicuspid aortic valve with severe aortic regurgitation, dilated aortic root measuring 40 mm.  LHC/RHC on 08/20/2022 showed no significant CAD, normal filling pressures.  Referred to cardiothoracic surgery, underwent aortic valve  replacement with 27 mm Edwards Inspiris Resilia valve with Dr. Laneta Simmers on 11/04/2022.    Complete heart block: Developed after AVR 11/2022, status post PPM -Follows with EP for device management  Atrial tachycardia: Noted after surgery, was started on digoxin.  Follows with EP, Toprol-XL was added but has not started yet.  Check digoxin level  Hyperlipidemia: On  atorvastatin 20 mg daily.  LDL 77 on 05/21/2022  Anemia: Most recent hemoglobin 9.6, likely due to blood loss following AVR.  Also reports has been having frequent epistaxis with significant bleeding.  Refer to ENT.  Check CBC.  RTC in 4 months  Medication Adjustments/Labs and Tests Ordered: Current medicines are reviewed at length with the patient today.  Concerns regarding medicines are outlined above.  Orders Placed This Encounter  Procedures   CBC   Comprehensive metabolic panel   TSH   Digoxin level   Ambulatory referral to ENT   No orders of the defined types were placed in this encounter.   Patient Instructions  Medication Instructions:  No changes *If you need a refill on your cardiac medications before your next appointment, please call your pharmacy*   Lab Work: CBC, CMET, TSH, Digoxin level- today If you have labs (blood work) drawn today and your tests are completely normal, you will receive your results only by: MyChart Message (if you have MyChart) OR A paper copy in the mail If you have any lab test that is abnormal or we need to change your treatment, we will call you to review the results.  Follow-Up: At [facility], you and your health needs are our priority.  As part of our continuing mission to provide you with exceptional heart care, we have created designated Provider Care Teams.  These Care Teams include your primary Cardiologist (physician) and Advanced Practice Providers (APPs -  Physician Assistants and Nurse Practitioners) who all work together to provide you with the care you need,  when you need it.  We recommend signing up for the patient portal called "MyChart".  Sign up information is provided on this After Visit Summary.  MyChart is used to connect with patients for Virtual Visits (Telemedicine).  Patients are able to view lab/test results, encounter notes, upcoming appointments, etc.  Non-urgent messages can be sent to your provider as well.   To learn more about what you can do with MyChart, go to [email or web address].    Your next appointment:   4 month(s)  Provider:   Little Ishikawa, MD       Signed, Little Ishikawa, MD  11/27/2022 5:42 PM    [facility]

## 2022-11-26 NOTE — Patient Instructions (Incomplete)
   After Your Pacemaker   Monitor your pacemaker site for redness, swelling, and drainage. Call the device clinic at [phone] if you experience these symptoms or fever/chills.  Your incision was closed with Steri-strips or staples:  You may shower 7 days after your procedure and wash your incision with soap and water. Avoid lotions, ointments, or perfumes over your incision until it is well-healed.  Your heart rate is running on average higher than we would like it.  We are adding Metoprolol succinate 50mg , take 1 daily at night before bedtime.    You may use a hot tub or a pool after your wound check appointment if the incision is completely closed.  Do not lift, push or pull greater than 10 pounds with the affected arm until 6 weeks after your procedure. UNTIL AFTER JULY 23RD.  There are no other restrictions in arm movement after your wound check appointment.  You may drive, unless driving has been restricted by your healthcare providers.  Remote monitoring is used to monitor your pacemaker from home. This monitoring is scheduled every 91 days by our office. It allows [place] to keep an eye on the functioning of your device to ensure it is working properly. You will routinely see your Electrophysiologist annually (more often if necessary).

## 2022-11-27 ENCOUNTER — Encounter: Payer: Self-pay | Admitting: Cardiology

## 2022-11-27 ENCOUNTER — Ambulatory Visit: Payer: BC Managed Care – PPO | Attending: Cardiology | Admitting: Cardiology

## 2022-11-27 ENCOUNTER — Ambulatory Visit (INDEPENDENT_AMBULATORY_CARE_PROVIDER_SITE_OTHER): Payer: BC Managed Care – PPO

## 2022-11-27 VITALS — BP 100/66 | HR 116 | Ht 64.0 in | Wt 164.2 lb

## 2022-11-27 DIAGNOSIS — I442 Atrioventricular block, complete: Secondary | ICD-10-CM | POA: Diagnosis not present

## 2022-11-27 DIAGNOSIS — Z950 Presence of cardiac pacemaker: Secondary | ICD-10-CM | POA: Diagnosis not present

## 2022-11-27 DIAGNOSIS — R000 Tachycardia, unspecified: Secondary | ICD-10-CM

## 2022-11-27 DIAGNOSIS — Z79899 Other long term (current) drug therapy: Secondary | ICD-10-CM

## 2022-11-27 DIAGNOSIS — E785 Hyperlipidemia, unspecified: Secondary | ICD-10-CM | POA: Diagnosis not present

## 2022-11-27 DIAGNOSIS — Z5181 Encounter for therapeutic drug level monitoring: Secondary | ICD-10-CM

## 2022-11-27 DIAGNOSIS — Z952 Presence of prosthetic heart valve: Secondary | ICD-10-CM

## 2022-11-27 DIAGNOSIS — I4719 Other supraventricular tachycardia: Secondary | ICD-10-CM

## 2022-11-27 DIAGNOSIS — D649 Anemia, unspecified: Secondary | ICD-10-CM

## 2022-11-27 DIAGNOSIS — R040 Epistaxis: Secondary | ICD-10-CM

## 2022-11-27 LAB — CUP PACEART INCLINIC DEVICE CHECK
Date Time Interrogation Session: 20240626200453
Implantable Lead Connection Status: 753985
Implantable Lead Connection Status: 753985
Implantable Lead Implant Date: 20240611
Implantable Lead Implant Date: 20240611
Implantable Lead Location: 753859
Implantable Lead Location: 753860
Implantable Lead Model: 3830
Implantable Lead Model: 5076
Implantable Pulse Generator Implant Date: 20240611

## 2022-11-27 MED ORDER — METOPROLOL SUCCINATE ER 50 MG PO TB24
50.0000 mg | ORAL_TABLET | Freq: Every day | ORAL | 3 refills | Status: DC
Start: 2022-11-27 — End: 2023-01-06

## 2022-11-27 NOTE — Progress Notes (Signed)
Wound check appointment. Steri-strips removed. Wound without redness or edema. Incision edges approximated, wound well healed. Normal device function. Thresholds, sensing, and impedances consistent with implant measurements. Device programmed at 3.5V/auto capture programmed on for extra safety margin until 3 month visit. Histogram distribution shows elevated ventricular rates and night rates.  AT/AF burden 0%, 1 device flagged VT event meets criteria for SVT, 9 beats.  Reviewed elevated heart rates with Dr. Elberta Fortis and EKG performed and reviewed.  Started patient on Metoprolol succinate 50mg  1 daily for rate control.   Patient is noticeably highly anxious and reports night terrors and PTSD type response since recent cardiac surgeries and hospitalization.  He has an evaluation today with mental health for support.   Patient educated about wound care, arm mobility, lifting restrictions. ROV in 3 months with implanting physician.  No programming changes made today.

## 2022-11-27 NOTE — Patient Instructions (Signed)
Medication Instructions:  No changes *If you need a refill on your cardiac medications before your next appointment, please call your pharmacy*   Lab Work: CBC, CMET, TSH, Digoxin level- today If you have labs (blood work) drawn today and your tests are completely normal, you will receive your results only by: MyChart Message (if you have MyChart) OR A paper copy in the mail If you have any lab test that is abnormal or we need to change your treatment, we will call you to review the results.  Follow-Up: At [facility], you and your health needs are our priority.  As part of our continuing mission to provide you with exceptional heart care, we have created designated Provider Care Teams.  These Care Teams include your primary Cardiologist (physician) and Advanced Practice Providers (APPs -  Physician Assistants and Nurse Practitioners) who all work together to provide you with the care you need, when you need it.  We recommend signing up for the patient portal called "MyChart".  Sign up information is provided on this After Visit Summary.  MyChart is used to connect with patients for Virtual Visits (Telemedicine).  Patients are able to view lab/test results, encounter notes, upcoming appointments, etc.  Non-urgent messages can be sent to your provider as well.   To learn more about what you can do with MyChart, go to [email or web address].    Your next appointment:   4 month(s)  Provider:   Little Ishikawa, MD

## 2022-11-28 ENCOUNTER — Telehealth: Payer: Self-pay | Admitting: Internal Medicine

## 2022-11-28 LAB — TSH: TSH: 8.38 u[IU]/mL — ABNORMAL HIGH (ref 0.450–4.500)

## 2022-11-28 LAB — CBC
Hematocrit: 31 % — ABNORMAL LOW (ref 37.5–51.0)
Hemoglobin: 9.8 g/dL — ABNORMAL LOW (ref 13.0–17.7)
MCH: 28.2 pg (ref 26.6–33.0)
MCHC: 31.6 g/dL (ref 31.5–35.7)
MCV: 89 fL (ref 79–97)
Platelets: 271 10*3/uL (ref 150–450)
RBC: 3.47 x10E6/uL — ABNORMAL LOW (ref 4.14–5.80)
RDW: 13.9 % (ref 11.6–15.4)
WBC: 6.2 10*3/uL (ref 3.4–10.8)

## 2022-11-28 LAB — COMPREHENSIVE METABOLIC PANEL
ALT: 22 IU/L (ref 0–44)
AST: 24 IU/L (ref 0–40)
Albumin: 4.2 g/dL (ref 3.9–4.9)
Alkaline Phosphatase: 176 IU/L — ABNORMAL HIGH (ref 44–121)
BUN/Creatinine Ratio: 23 (ref 10–24)
BUN: 16 mg/dL (ref 8–27)
Bilirubin Total: 0.3 mg/dL (ref 0.0–1.2)
CO2: 20 mmol/L (ref 20–29)
Calcium: 9.1 mg/dL (ref 8.6–10.2)
Chloride: 102 mmol/L (ref 96–106)
Creatinine, Ser: 0.7 mg/dL — ABNORMAL LOW (ref 0.76–1.27)
Globulin, Total: 2 g/dL (ref 1.5–4.5)
Glucose: 147 mg/dL — ABNORMAL HIGH (ref 70–99)
Potassium: 4.1 mmol/L (ref 3.5–5.2)
Sodium: 139 mmol/L (ref 134–144)
Total Protein: 6.2 g/dL (ref 6.0–8.5)
eGFR: 105 mL/min/{1.73_m2} (ref >59)

## 2022-11-28 LAB — DIGOXIN LEVEL: Digoxin, Serum: 0.5 ng/mL (ref 0.5–0.9)

## 2022-11-28 NOTE — Telephone Encounter (Signed)
Per 10/08/22 request from [facility] - faxed the following information:  Pulmonary office notes dated 10/05/22, PFT results dated 08/12/22, Transesophageal Report dated 11/04/22 and Cardiac Compass report dated 11/27/22.   Included on the cover page that patient's return to work date is still unknown.   Patient is scheduled to be seen by Dr. Celine Mans on 6/28 and I will fax office notes from that visit in a few days.   Loletta Parish fax# [phone]

## 2022-11-29 ENCOUNTER — Other Ambulatory Visit: Payer: Self-pay

## 2022-11-29 ENCOUNTER — Encounter: Payer: Self-pay | Admitting: Internal Medicine

## 2022-11-29 ENCOUNTER — Ambulatory Visit: Payer: BC Managed Care – PPO | Admitting: Internal Medicine

## 2022-11-29 VITALS — BP 120/74 | HR 85 | Temp 97.8°F | Ht 64.0 in | Wt 165.2 lb

## 2022-11-29 DIAGNOSIS — J439 Emphysema, unspecified: Secondary | ICD-10-CM | POA: Diagnosis not present

## 2022-11-29 DIAGNOSIS — R7989 Other specified abnormal findings of blood chemistry: Secondary | ICD-10-CM

## 2022-11-29 DIAGNOSIS — J9611 Chronic respiratory failure with hypoxia: Secondary | ICD-10-CM | POA: Diagnosis not present

## 2022-11-29 DIAGNOSIS — J4489 Other specified chronic obstructive pulmonary disease: Secondary | ICD-10-CM | POA: Diagnosis not present

## 2022-11-29 NOTE — Patient Instructions (Signed)
Please schedule follow up scheduled with myself in 6 months.  If my schedule is not open yet, we will contact you with a reminder closer to that time. Please call [phone] if you haven't heard from [place] a month before.   Before your next visit I would like you to have: Go to pulmonary rehab.  We will place an order for a portable oxygen concentrator today.   Continue breztri 2 puffs twice daily, gargle after use.  Continue albuterol as needed.

## 2022-11-29 NOTE — Progress Notes (Signed)
Christopher Arias    [id number]    1961-04-28  Primary Care Physician:John, Len Blalock, MD Date of Appointment: 11/29/2022 Established Patient Visit  Chief complaint:   Chief Complaint  Patient presents with   Follow-up    No c/o      HPI: Christopher Arias is a 63 y.o. man with COPD FEV1 60% of predicted on home oxygen with frequent exacerbations. History of PSVR Pneumonia December 2023. On oxygen since then. Had AVR in June 2024 and PPM for CHB.  Interval Updates: Here for follow up after COPD exacerbation and AVR replacement. Had complications post-op with bleeding and redo sternotomy. Ended up having PPM placement for complete heart block. He is seeing his surgeon Dr Laneta Simmers in July and afterwards hopeful to start cardiac rehab. He has been walking around the grocery store, and around the house. Overall breathing better. Still taking breztri 2 puffs twice a day. Current albuterol use is minimal none in the last two weeks.   DME - [employer] 2-3L  I have reviewed the patient's family social and past medical history and updated as appropriate.   Past Medical History:  Diagnosis Date   ANTERIOR PITUITARY HYPERFUNCTION 11/23/2009   ANXIETY 11/24/2007   COPD (chronic obstructive pulmonary disease) (HCC) 05/31/2021   GERD 11/24/2007   GYNECOMASTIA 11/10/2009   HYPERLIPIDEMIA 11/24/2007   HYPOTHYROIDISM, POST-RADIATION 03/16/2010   PONV (postoperative nausea and vomiting)    Severe aortic insufficiency     Past Surgical History:  Procedure Laterality Date   AORTIC VALVE REPLACEMENT N/A 11/04/2022   Procedure: AORTIC VALVE REPLACEMENT (AVR) USING INSPIRIS RESILIA 27 MM AORTIC VALVE;  Surgeon: Alleen Borne, MD;  Location: [facility] OR;  Service: Open Heart Surgery;  Laterality: N/A;  Median sternotomy   EXPLORATION POST OPERATIVE OPEN HEART N/A 11/04/2022   Procedure: EXPLORATION POST OPERATIVE OPEN HEART;  Surgeon: Alleen Borne, MD;  Location: [facility] OR;  Service: Open Heart Surgery;   Laterality: N/A;   HEMORRHOID SURGERY     INGUINAL HERNIA REPAIR Bilateral    PACEMAKER IMPLANT N/A 11/12/2022   Procedure: PACEMAKER IMPLANT;  Surgeon: Regan Lemming, MD;  Location: [facility] INVASIVE CV LAB;  Service: Cardiovascular;  Laterality: N/A;   RIGHT/LEFT HEART CATH AND CORONARY ANGIOGRAPHY N/A 08/20/2022   Procedure: RIGHT/LEFT HEART CATH AND CORONARY ANGIOGRAPHY;  Surgeon: Marykay Lex, MD;  Location: [facility] INVASIVE CV LAB;  Service: Cardiovascular;  Laterality: N/A;   TEE WITHOUT CARDIOVERSION N/A 08/01/2022   Procedure: TRANSESOPHAGEAL ECHOCARDIOGRAM (TEE);  Surgeon: Wendall Stade, MD;  Location: [facility] ENDOSCOPY;  Service: Cardiovascular;  Laterality: N/A;   TEE WITHOUT CARDIOVERSION N/A 11/04/2022   Procedure: TRANSESOPHAGEAL ECHOCARDIOGRAM;  Surgeon: Alleen Borne, MD;  Location: [facility] OR;  Service: Open Heart Surgery;  Laterality: N/A;    Family History  Problem Relation Age of Onset   Heart disease Father    Stroke Other    COPD Other    Heart disease Other    Colon cancer Neg Hx     Social History   Occupational History    Employer: [employer]  Tobacco Use   Smoking status: Former    Packs/day: 1.00    Years: 20.00    Additional pack years: 0.00    Total pack years: 20.00    Types: Cigarettes    Quit date: 08/24/2021    Years since quitting: 1.2   Smokeless tobacco: Never  Vaping Use   Vaping Use: Never used  Substance and Sexual Activity   Alcohol use: Yes    Alcohol/week: 1.0 standard drink of alcohol    Types: 1 Cans of beer per week    Comment: one beer every 2 weeks, maybe 1 mixed drink once a month   Drug use: No   Sexual activity: Not on file     Physical Exam: Blood pressure 120/74, pulse 85, temperature 97.8 F (36.6 C), temperature source Oral, height 5\' 4"  (1.626 m), weight 165 lb 3.2 oz (74.9 kg), SpO2 95 %.  Gen:      No acute distress, well appearing ENT:  on nasal cannula, no nasal polyps, mucus membranes moist Lungs:   diminished, no  wheezes or crackles CV:        RRR no mrg   Data Reviewed: Imaging: I have personally reviewed the chest xrays Dec 2023 showing hyperinflation, ctpe study dec 2023 shows upper lobe predominant emphysema  PFTs:     Latest Ref Rng & Units 08/12/2022    8:52 AM  PFT Results  FVC-Pre L 3.46   FVC-Predicted Pre % 88   FVC-Post L 3.64   FVC-Predicted Post % 92   Pre FEV1/FVC % % 52   Post FEV1/FCV % % 51   FEV1-Pre L 1.79   FEV1-Predicted Pre % 60   FEV1-Post L 1.86   DLCO uncorrected ml/min/mmHg 8.82   DLCO UNC% % 37   DLCO corrected ml/min/mmHg 8.65   DLCO COR %Predicted % 36   DLVA Predicted % 37   TLC L 7.00   TLC % Predicted % 116   RV % Predicted % 149    I have personally reviewed the patient's PFTs and moderate airflow limitation  Labs:  Immunization status: Immunization History  Administered Date(s) Administered   H1N1 07/19/2008   Influenza,inj,Quad PF,6+ Mos 03/12/2013, 06/17/2014, 03/08/2019, 05/30/2021, 05/21/2022   PFIZER(Purple Top)SARS-COV-2 Vaccination 08/30/2019, 09/20/2019   Td 07/19/2008   Tdap 09/13/2012   Zoster Recombinat (Shingrix) 11/27/2020, 01/31/2021    External Records Personally Reviewed: pulmonary, cardiology, surgery, hospital stay  Assessment:  COPD FEV1 60% of predicted  S/p AVR and PPM for CHB.  Plan/Recommendations:  Continue albuterol 2 puffs every 4 hours while awake or nebulizer treatment.  Continue breztri 2 puffs twice a day, gargle after use.   Agree with cardiac rehab once he is able to do so.   Ambulated for POC but he did not desat below 91%.  Return to Care: Return in about 6 months (around 05/31/2023).   Durel Salts, MD Pulmonary and Critical Care Medicine [facility] [phone]

## 2022-12-04 DIAGNOSIS — R7989 Other specified abnormal findings of blood chemistry: Secondary | ICD-10-CM | POA: Diagnosis not present

## 2022-12-04 DIAGNOSIS — I4719 Other supraventricular tachycardia: Secondary | ICD-10-CM | POA: Diagnosis not present

## 2022-12-05 LAB — T4, FREE: Free T4: 1.34 ng/dL (ref 0.82–1.77)

## 2022-12-06 ENCOUNTER — Other Ambulatory Visit: Payer: Self-pay | Admitting: Physician Assistant

## 2022-12-10 ENCOUNTER — Other Ambulatory Visit: Payer: Self-pay | Admitting: Surgery

## 2022-12-10 DIAGNOSIS — Z952 Presence of prosthetic heart valve: Secondary | ICD-10-CM

## 2022-12-11 ENCOUNTER — Ambulatory Visit (INDEPENDENT_AMBULATORY_CARE_PROVIDER_SITE_OTHER): Payer: Self-pay | Admitting: Surgery

## 2022-12-11 ENCOUNTER — Ambulatory Visit
Admission: RE | Admit: 2022-12-11 | Discharge: 2022-12-11 | Disposition: A | Discharge status: Home / Self Care | Payer: BC Managed Care – PPO | Source: Ambulatory Visit | Attending: Surgery | Admitting: Surgery

## 2022-12-11 ENCOUNTER — Encounter: Payer: Self-pay | Admitting: Surgery

## 2022-12-11 VITALS — BP 118/75 | HR 82 | Resp 20 | Ht 64.0 in | Wt 162.0 lb

## 2022-12-11 DIAGNOSIS — Z952 Presence of prosthetic heart valve: Secondary | ICD-10-CM

## 2022-12-13 NOTE — Progress Notes (Signed)
HPI: Patient returns for routine postoperative follow-up having undergone aortic valve replacement using a 27 mm Edwards INSPIRIS RESILIA pericardial valve on 11/04/2022. The patient's early postoperative recovery while in the hospital was notable for postoperative mediastinal bleeding related to a sternal wire which required exploration in the operating room for hematoma evacuation and suture ligation of the bleeding site. Since hospital discharge the patient reports that he feels well.  He is walking daily without chest pain or shortness of breath.   Current Outpatient Medications  Medication Sig Dispense Refill   albuterol (PROVENTIL) (2.5 MG/3ML) 0.083% nebulizer solution Take 3 mLs (2.5 mg total) by nebulization every 6 (six) hours as needed for wheezing or shortness of breath. 75 mL 12   albuterol (VENTOLIN HFA) 108 (90 Base) MCG/ACT inhaler Inhale 2 puffs into the lungs every 6 (six) hours as needed for wheezing or shortness of breath. 18 g 3   aspirin EC 81 MG tablet Take 1 tablet (81 mg total) by mouth daily. Swallow whole.     atorvastatin (LIPITOR) 20 MG tablet Take 1 tablet (20 mg total) by mouth daily. 90 tablet 3   Budeson-Glycopyrrol-Formoterol (BREZTRI AEROSPHERE) 160-9-4.8 MCG/ACT AERO Inhale 2 puffs into the lungs in the morning and at bedtime. 10.7 g 5   clonazePAM (KLONOPIN) 1 MG tablet TAKE 1 TABLET BY MOUTH TWICE A DAY AS NEEDED 60 tablet 2   digoxin (LANOXIN) 0.125 MG tablet Take 1 tablet (0.125 mg total) by mouth daily. 30 tablet 1   diphenhydramine-acetaminophen (TYLENOL PM) 25-500 MG TABS tablet Take 2 tablets by mouth at bedtime.     Fe Fum-Vit C-Vit B12-FA (TRIGELS-F FORTE) CAPS capsule Take 1 capsule by mouth daily after breakfast.  0   levothyroxine (SYNTHROID) 100 MCG tablet Take 1 tablet (100 mcg total) by mouth daily. 90 tablet 3   metoprolol succinate (TOPROL-XL) 50 MG 24 hr tablet Take 1 tablet (50 mg total) by mouth daily. Take with or immediately following a  meal. 90 tablet 3   ondansetron (ZOFRAN-ODT) 4 MG disintegrating tablet Take 1 tablet (4 mg total) by mouth every 8 (eight) hours as needed for nausea or vomiting. 30 tablet 1   oxyCODONE (OXY IR/ROXICODONE) 5 MG immediate release tablet Take 1 tablet (5 mg total) by mouth every 6 (six) hours as needed for severe pain. 28 tablet 0   OXYGEN Inhale 2-3 L into the lungs at bedtime. As needed during the day     sertraline (ZOLOFT) 100 MG tablet Take 2 tablets (200 mg total) by mouth daily. 180 tablet 3   tamsulosin (FLOMAX) 0.4 MG CAPS capsule Take 1 capsule (0.4 mg total) by mouth daily. 30 capsule 1   varenicline (CHANTIX) 1 MG tablet TAKE 1 TABLET BY MOUTH TWICE A DAY (Patient taking differently: Take 1 mg by mouth daily.) 180 tablet 2   No current facility-administered medications for this visit.    Physical Exam: BP 118/75   Pulse 82   Resp 20   Ht 5\' 4"  (1.626 m)   Wt 162 lb (73.5 kg)   SpO2 95% Comment: RA  BMI 27.81 kg/m  He looks well. Cardiac exam shows regular rate and rhythm with normal heart sounds.  There is no murmur. Lungs are clear. The chest incision is healing well and the sternum is stable. There is no peripheral edema.   Diagnostic Tests:  Narrative & Impression  CLINICAL DATA:  Status post atrial valve replacement 11/04/2022.   EXAM: CHEST - 2 VIEW  COMPARISON:  Chest radiographs 11/05/2022, 11/09/2022, 05/10/2022   FINDINGS: Status post median sternotomy and aortic valve replacement. Redemonstration of left chest wall cardiac pacer with leads overlying the right atrium and right ventricle, similar to prior. Cardiac silhouette and mediastinal contours are within normal limits. There is again flattening of the diaphragms and mild-to-moderate hyperinflation. Interval decrease in minimal blunting of the costophrenic angles, likely trace pleural fluid.   No pneumothorax.   Bilateral 7 mm nodular densities overlie the bilateral lower lungs at the same  height, presumably nipple shadows.   No acute skeletal abnormality.   IMPRESSION: 1. Interval decrease in minimal blunting of the costophrenic angles, likely trace pleural effusions. 2. Chronic mild-to-moderate hyperinflation. 3. Status post median sternotomy and aortic valve replacement.     Electronically Signed   By: Neita Garnet M.D.   On: 12/11/2022 11:42      Impression:  Overall he is doing well 5 weeks following his surgery.  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  I encouraged him to continue increasing his activity and he is planning on participating in cardiac rehab.  He is scheduled for a follow-up echocardiogram on 12/16/2022.  Plan:  He will continue to follow-up with cardiology and will return to see me if he has any problems with his incision.   Alleen Borne, MD [facility] [phone]

## 2022-12-16 ENCOUNTER — Ambulatory Visit (HOSPITAL_COMMUNITY): Payer: BC Managed Care – PPO | Attending: Cardiology

## 2022-12-16 ENCOUNTER — Other Ambulatory Visit (HOSPITAL_COMMUNITY): Payer: Self-pay

## 2022-12-16 DIAGNOSIS — Z952 Presence of prosthetic heart valve: Secondary | ICD-10-CM | POA: Diagnosis not present

## 2022-12-16 LAB — ECHOCARDIOGRAM COMPLETE
AV Mean grad: 4 mmHg
AV Peak grad: 6.7 mmHg
Ao pk vel: 1.3 m/s
Area-P 1/2: 3.56 cm2
S' Lateral: 2.7 cm

## 2022-12-17 ENCOUNTER — Other Ambulatory Visit: Payer: Self-pay | Admitting: Pulmonary Disease

## 2022-12-17 ENCOUNTER — Other Ambulatory Visit (HOSPITAL_COMMUNITY): Payer: Self-pay

## 2022-12-17 DIAGNOSIS — I77810 Thoracic aortic ectasia: Secondary | ICD-10-CM

## 2022-12-17 DIAGNOSIS — Z952 Presence of prosthetic heart valve: Secondary | ICD-10-CM

## 2022-12-19 ENCOUNTER — Other Ambulatory Visit (HOSPITAL_COMMUNITY): Payer: Self-pay

## 2022-12-19 ENCOUNTER — Telehealth: Payer: Self-pay

## 2022-12-19 NOTE — Telephone Encounter (Signed)
PA submitted and is pending determination.

## 2022-12-19 NOTE — Telephone Encounter (Signed)
*  Pulm  PA request received for Varenicline Tartrate 1MG  tablets  PA submitted to [employer] via CMM and is pending additional questions/determination  Key: [id number]

## 2022-12-20 ENCOUNTER — Encounter (HOSPITAL_COMMUNITY): Payer: Self-pay

## 2022-12-23 ENCOUNTER — Telehealth (HOSPITAL_COMMUNITY): Payer: Self-pay | Admitting: *Deleted

## 2022-12-23 ENCOUNTER — Telehealth (HOSPITAL_COMMUNITY): Payer: Self-pay

## 2022-12-23 NOTE — Telephone Encounter (Signed)
-----   Message from Charlott Holler sent at 12/23/2022  3:36 PM EDT ----- Regarding: RE: O2 usage at Cardiac Rehab Hello [name], His goal oxygen saturation is over 88% at rest of exertion. It would be ok for him to start on 2LPM and titrate up as tolerated to achieve this.   Thanks for taking care of him. Dr Celine Mans ----- Message ----- From: Chelsea Aus, RN Sent: 12/20/2022   1:44 PM EDT To: Charlott Holler, MD Subject: O2 usage at Cardiac Rehab                       Dr. Celine Mans,  The above pt is scheduled for Cardiac Rehab s/p AVR on 11/04/22.  Pt with history of COPD Stage 2. Reviewed your office note, pt uses 2-3LNC.  In speaking with this pt, he is concerned he may need oxygen during exercise although he did not desat during the office walk test - maintained 91%.  For our cardiac rehab patients we do need parameters for O2 therapy and permission for titration.  What is an appropriate liter flow for exercise as well as rest? What is an acceptable O2 saturation for this pt to maintain?   Okay to titrate as needed?   Thanks you for your valued input, we are looking forward to working with [name] RN, BSN Cardiac and Pulmonary Rehab Nurse Navigator

## 2022-12-24 ENCOUNTER — Encounter (HOSPITAL_COMMUNITY)
Admission: RE | Admit: 2022-12-24 | Discharge: 2022-12-24 | Disposition: A | Discharge status: Home / Self Care | Payer: BC Managed Care – PPO | Source: Ambulatory Visit | Attending: Cardiology | Admitting: Cardiology

## 2022-12-24 VITALS — BP 106/70 | HR 86 | Ht 65.0 in | Wt 166.4 lb

## 2022-12-24 DIAGNOSIS — Z952 Presence of prosthetic heart valve: Secondary | ICD-10-CM | POA: Diagnosis not present

## 2022-12-24 NOTE — Progress Notes (Signed)
Cardiac Rehab Medication Review   Does the patient  feel that his/her medications are working for him/her?  yes  Has the patient been experiencing any side effects to the medications prescribed?  no  Does the patient measure his/her own blood pressure or blood glucose at home?  no   Does the patient have any problems obtaining medications due to transportation or finances?   no  Understanding of regimen: good Understanding of indications: good Potential of compliance: good    Comments: Pt does not check blood pressure at home.     Lorin Picket 12/24/2022 10:36 AM

## 2022-12-24 NOTE — Progress Notes (Signed)
Cardiac Individual Treatment Plan  Patient Details  Name: Christopher Arias MRN: [id number] Date of Birth: 1960/10/13 Referring Provider:   Flowsheet Row INTENSIVE CARDIAC REHAB ORIENT from 12/24/2022 in [facility] for Heart, Vascular, & Lung Health  Referring Provider [name], MD       Initial Encounter Date:  Flowsheet Row INTENSIVE CARDIAC REHAB ORIENT from 12/24/2022 in [facility] for Heart, Vascular, & Lung Health  Date 12/24/22       Visit Diagnosis: 11/04/22 Status post aortic valve replacement  Patient's Home Medications on Admission:  Current Outpatient Medications:    albuterol (VENTOLIN HFA) 108 (90 Base) MCG/ACT inhaler, Inhale 2 puffs into the lungs every 6 (six) hours as needed for wheezing or shortness of breath., Disp: 18 g, Rfl: 3   aspirin EC 81 MG tablet, Take 1 tablet (81 mg total) by mouth daily. Swallow whole., Disp: , Rfl:    atorvastatin (LIPITOR) 20 MG tablet, Take 1 tablet (20 mg total) by mouth daily., Disp: 90 tablet, Rfl: 3   Budeson-Glycopyrrol-Formoterol (BREZTRI AEROSPHERE) 160-9-4.8 MCG/ACT AERO, Inhale 2 puffs into the lungs in the morning and at bedtime., Disp: 10.7 g, Rfl: 5   clonazePAM (KLONOPIN) 1 MG tablet, TAKE 1 TABLET BY MOUTH TWICE A DAY AS NEEDED, Disp: 60 tablet, Rfl: 2   digoxin (LANOXIN) 0.125 MG tablet, Take 1 tablet (0.125 mg total) by mouth daily., Disp: 30 tablet, Rfl: 1   diphenhydramine-acetaminophen (TYLENOL PM) 25-500 MG TABS tablet, Take 2 tablets by mouth at bedtime., Disp: , Rfl:    Fe Fum-Vit C-Vit B12-FA (TRIGELS-F FORTE) CAPS capsule, Take 1 capsule by mouth daily after breakfast., Disp: , Rfl: 0   levothyroxine (SYNTHROID) 100 MCG tablet, Take 1 tablet (100 mcg total) by mouth daily., Disp: 90 tablet, Rfl: 3   metoprolol succinate (TOPROL-XL) 50 MG 24 hr tablet, Take 1 tablet (50 mg total) by mouth daily. Take with or immediately following a meal., Disp: 90 tablet,  Rfl: 3   ondansetron (ZOFRAN-ODT) 4 MG disintegrating tablet, Take 1 tablet (4 mg total) by mouth every 8 (eight) hours as needed for nausea or vomiting., Disp: 30 tablet, Rfl: 1   OXYGEN, Inhale 2-3 L into the lungs at bedtime. As needed during the day, Disp: , Rfl:    sertraline (ZOLOFT) 100 MG tablet, Take 2 tablets (200 mg total) by mouth daily., Disp: 180 tablet, Rfl: 3   tamsulosin (FLOMAX) 0.4 MG CAPS capsule, Take 1 capsule (0.4 mg total) by mouth daily., Disp: 30 capsule, Rfl: 1   varenicline (CHANTIX) 1 MG tablet, TAKE 1 TABLET BY MOUTH TWICE A DAY (Patient taking differently: Take 1 mg by mouth daily.), Disp: 180 tablet, Rfl: 2   albuterol (PROVENTIL) (2.5 MG/3ML) 0.083% nebulizer solution, Take 3 mLs (2.5 mg total) by nebulization every 6 (six) hours as needed for wheezing or shortness of breath. (Patient not taking: Reported on 12/24/2022), Disp: 75 mL, Rfl: 12   oxyCODONE (OXY IR/ROXICODONE) 5 MG immediate release tablet, Take 1 tablet (5 mg total) by mouth every 6 (six) hours as needed for severe pain. (Patient not taking: Reported on 12/24/2022), Disp: 28 tablet, Rfl: 0  Past Medical History: Past Medical History:  Diagnosis Date   ANTERIOR PITUITARY HYPERFUNCTION 11/23/2009   ANXIETY 11/24/2007   COPD (chronic obstructive pulmonary disease) (HCC) 05/31/2021   GERD 11/24/2007   GYNECOMASTIA 11/10/2009   HYPERLIPIDEMIA 11/24/2007   HYPOTHYROIDISM, POST-RADIATION 03/16/2010   PONV (postoperative nausea and vomiting)  Severe aortic insufficiency     Tobacco Use: Social History   Tobacco Use  Smoking Status Former   Current packs/day: 0.00   Average packs/day: 1 pack/day for 20.0 years (20.0 ttl pk-yrs)   Types: Cigarettes   Start date: 08/24/2001   Quit date: 08/24/2021   Years since quitting: 1.3  Smokeless Tobacco Never    Labs: Review Flowsheet  More data exists      Latest Ref Rng & Units 05/30/2021 05/21/2022 08/20/2022 10/31/2022 11/04/2022  Labs for ITP Cardiac  and Pulmonary Rehab  Cholestrol 0 - 200 mg/dL 409  811  - - -  LDL (calc) 0 - 99 mg/dL 914  77  - - -  HDL-C >78.29 mg/dL 56.21  30.86  - - -  Trlycerides 0.0 - 149.0 mg/dL 57.8  46.9  - - -  Hemoglobin A1c 4.8 - 5.6 % 5.7  5.9  - 5.7  -  PH, Arterial 7.35 - 7.45 - - 7.359  7.41  7.324  7.321  7.296  7.318  7.365  7.257  7.373  7.359   PCO2 arterial 32 - 48 mmHg - - 35.6  37  42.7  43.2  46.0  44.2  39.2  45.1  36.1  38.7   Bicarbonate 20.0 - 28.0 mmol/L - - 21.2  21.7  20.1  23.5  22.1  22.4  22.7  22.7  22.6  20.4  21.0  23.0  21.8   TCO2 22 - 32 mmol/L - - 22  23  21   - 23  24  24  24  24  24  22  24  24  22  24  24  23  24  25    Acid-base deficit 0.0 - 2.0 mmol/L - - 4.0  4.0  5.0  0.8  4.0  4.0  4.0  3.0  3.0  7.0  4.0  3.0  3.0   O2 Saturation % - - 72  70  97  98.1  97  94  99  100  100  98  100  78  100     Details       Multiple values from one day are sorted in reverse-chronological order         Capillary Blood Glucose: Lab Results  Component Value Date   GLUCAP 116 (H) 11/06/2022   GLUCAP 115 (H) 11/06/2022   GLUCAP 118 (H) 11/06/2022   GLUCAP 128 (H) 11/06/2022   GLUCAP 110 (H) 11/06/2022     Exercise Target Goals: Exercise Program Goal: Individual exercise prescription set using results from initial 6 min walk test and THRR while considering  patient's activity barriers and safety.   Exercise Prescription Goal: Initial exercise prescription builds to 30-45 minutes a day of aerobic activity, 2-3 days per week.  Home exercise guidelines will be given to patient during program as part of exercise prescription that the participant will acknowledge.  Activity Barriers & Risk Stratification:  Activity Barriers & Cardiac Risk Stratification - 12/24/22 1346       Activity Barriers & Cardiac Risk Stratification   Activity Barriers Arthritis;Joint Problems;Shortness of Breath;Balance Concerns;Other (comment)    Comments Sternal precautions/ S/P PPM    Cardiac Risk  Stratification High             6 Minute Walk:  6 Minute Walk     Row Name 12/24/22 1133         6 Minute Walk   Phase Initial  Distance 1291 feet     Walk Time 6 minutes     # of Rest Breaks 0     MPH 2.45     METS 3.4     RPE 11     Perceived Dyspnea  1     VO2 Peak 11.81     Symptoms Yes (comment)     Comments SOB, RPD = 1     Resting HR 86 bpm     Resting BP 106/70     Resting Oxygen Saturation  95 %     Exercise Oxygen Saturation  during 6 min walk 90 %     Max Ex. HR 105 bpm     Max Ex. BP 130/64     2 Minute Post BP 108/70       Interval HR   1 Minute HR 87     2 Minute HR 102     3 Minute HR 101     4 Minute HR 100     5 Minute HR 103     6 Minute HR 103     2 Minute Post HR 87     Interval Heart Rate? Yes       Interval Oxygen   Interval Oxygen? Yes     Baseline Oxygen Saturation % 95 %     1 Minute Oxygen Saturation % 92 %     1 Minute Liters of Oxygen 0 L     2 Minute Oxygen Saturation % 91 %     2 Minute Liters of Oxygen 0 L     3 Minute Oxygen Saturation % 90 %     3 Minute Liters of Oxygen 0 L     4 Minute Oxygen Saturation % 90 %     4 Minute Liters of Oxygen 0 L     5 Minute Oxygen Saturation % 90 %     5 Minute Liters of Oxygen 0 L     6 Minute Oxygen Saturation % 90 %     6 Minute Liters of Oxygen 0 L     2 Minute Post Oxygen Saturation % 94 %     2 Minute Post Liters of Oxygen 0 L              Oxygen Initial Assessment:   Oxygen Re-Evaluation:   Oxygen Discharge (Final Oxygen Re-Evaluation):   Initial Exercise Prescription:  Initial Exercise Prescription - 12/24/22 1300       Date of Initial Exercise RX and Referring Provider   Date 12/24/22    Referring Provider [name], MD    Expected Discharge Date 03/19/23      Bike   Level 1    Watts 33    Minutes 15    METs 3.4      NuStep   Level 1    SPM 75    Minutes 15    METs 3.4      Prescription Details   Frequency (times per week) 3     Duration Progress to 30 minutes of continuous aerobic without signs/symptoms of physical distress      Intensity   THRR 40-80% of Max Heartrate 64-127    Ratings of Perceived Exertion 11-13    Perceived Dyspnea 0-4      Progression   Progression Continue progressive overload as per policy without signs/symptoms or physical distress.      Resistance Training   Training Prescription Yes  Weight 3 lbs    Reps 10-15             Perform Capillary Blood Glucose checks as needed.  Exercise Prescription Changes:   Exercise Comments:   Exercise Goals and Review:   Exercise Goals     Row Name 12/24/22 1041             Exercise Goals   Increase Physical Activity Yes       Intervention Provide advice, education, support and counseling about physical activity/exercise needs.;Develop an individualized exercise prescription for aerobic and resistive training based on initial evaluation findings, risk stratification, comorbidities and participant's personal goals.       Expected Outcomes Short Term: Attend rehab on a regular basis to increase amount of physical activity.;Long Term: Exercising regularly at least 3-5 days a week.;Long Term: Add in home exercise to make exercise part of routine and to increase amount of physical activity.       Increase Strength and Stamina Yes       Intervention Provide advice, education, support and counseling about physical activity/exercise needs.;Develop an individualized exercise prescription for aerobic and resistive training based on initial evaluation findings, risk stratification, comorbidities and participant's personal goals.       Expected Outcomes Short Term: Increase workloads from initial exercise prescription for resistance, speed, and METs.;Short Term: Perform resistance training exercises routinely during rehab and add in resistance training at home;Long Term: Improve cardiorespiratory fitness, muscular endurance and strength as  measured by increased METs and functional capacity ( )       Able to understand and use rate of perceived exertion (RPE) scale Yes       Intervention Provide education and explanation on how to use RPE scale       Expected Outcomes Short Term: Able to use RPE daily in rehab to express subjective intensity level;Long Term:  Able to use RPE to guide intensity level when exercising independently       Knowledge and understanding of Target Heart Rate Range (THRR) Yes       Intervention Provide education and explanation of THRR including how the numbers were predicted and where they are located for reference       Expected Outcomes Short Term: Able to state/look up THRR;Long Term: Able to use THRR to govern intensity when exercising independently;Short Term: Able to use daily as guideline for intensity in rehab       Understanding of Exercise Prescription Yes       Intervention Provide education, explanation, and written materials on patient's individual exercise prescription       Expected Outcomes Short Term: Able to explain program exercise prescription;Long Term: Able to explain home exercise prescription to exercise independently                Exercise Goals Re-Evaluation :   Discharge Exercise Prescription (Final Exercise Prescription Changes):   Nutrition:  Target Goals: Understanding of nutrition guidelines, daily intake of sodium 1500mg , cholesterol 200mg , calories 30% from fat and 7% or less from saturated fats, daily to have 5 or more servings of fruits and vegetables.  Biometrics:  Pre Biometrics - 12/24/22 1045       Pre Biometrics   Waist Circumference 38 inches    Hip Circumference 38.5 inches    Waist to Hip Ratio 0.99 %    Triceps Skinfold 14 mm    % Body Fat 26.4 %    Grip Strength 23 kg    Flexibility 10 in  Single Leg Stand 3.2 seconds              Nutrition Therapy Plan and Nutrition Goals:   Nutrition Assessments:  MEDIFICTS Score Key: ?70  Need to make dietary changes  40-70 Heart Healthy Diet ? 40 Therapeutic Level Cholesterol Diet    Picture Your Plate Scores: <69 Unhealthy dietary pattern with much room for improvement. 41-50 Dietary pattern unlikely to meet recommendations for good health and room for improvement. 51-60 More healthful dietary pattern, with some room for improvement.  >60 Healthy dietary pattern, although there may be some specific behaviors that could be improved.    Nutrition Goals Re-Evaluation:   Nutrition Goals Re-Evaluation:   Nutrition Goals Discharge (Final Nutrition Goals Re-Evaluation):   Psychosocial: Target Goals: Acknowledge presence or absence of significant depression and/or stress, maximize coping skills, provide positive support system. Participant is able to verbalize types and ability to use techniques and skills needed for reducing stress and depression.  Initial Review & Psychosocial Screening:  Initial Psych Review & Screening - 12/24/22 1350       Initial Review   Current issues with Current Sleep Concerns   Pt voices his sleep issues have been going on for many years     Family Dynamics   Good Support System? Yes   Pt has spouse and friends     Barriers   Psychosocial barriers to participate in program There are no identifiable barriers or psychosocial needs.      Screening Interventions   Interventions Encouraged to exercise             Quality of Life Scores:  Quality of Life - 12/24/22 1351       Quality of Life   Select Quality of Life      Quality of Life Scores   Health/Function Pre 23.73 %    Socioeconomic Pre 30 %    Psych/Spiritual Pre 25.57 %    Family Pre 26.25 %    GLOBAL Pre 25.76 %            Scores of 19 and below usually indicate a poorer quality of life in these areas.  A difference of  2-3 points is a clinically meaningful difference.  A difference of 2-3 points in the total score of the Quality of Life Index has been  associated with significant improvement in overall quality of life, self-image, physical symptoms, and general health in studies assessing change in quality of life.  PHQ-9: Review Flowsheet  More data exists      12/24/2022 11/20/2022 05/14/2022 11/27/2020 03/08/2019  Depression screen PHQ 2/9  Decreased Interest 0 0 0 0 0  Down, Depressed, Hopeless 0 0 0 0 0  PHQ - 2 Score 0 0 0 0 0  Altered sleeping 2 - - - -  Tired, decreased energy 0 - - - -  Change in appetite 0 - - - -  Feeling bad or failure about yourself  0 - - - -  Trouble concentrating 0 - - - -  Moving slowly or fidgety/restless 0 - - - -  Suicidal thoughts 0 - - - -  PHQ-9 Score 2 - - - -  Difficult doing work/chores Somewhat difficult - - - -    Details           Interpretation of Total Score  Total Score Depression Severity:  1-4 = Minimal depression, 5-9 = Mild depression, 10-14 = Moderate depression, 15-19 = Moderately severe depression, 20-27 =  Severe depression   Psychosocial Evaluation and Intervention:   Psychosocial Re-Evaluation:   Psychosocial Discharge (Final Psychosocial Re-Evaluation):   Vocational Rehabilitation: Provide vocational rehab assistance to qualifying candidates.   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 Participant will state understanding/return demonstration of topics presented.     Core Videos: Exercise    Move It!  Clinical staff conducted group or individual video education with verbal and written material and guidebook.  Patient learns the recommended Pritikin exercise program. Exercise with the goal of living a long, healthy life.  Some of the health benefits of exercise include controlled diabetes, healthier blood pressure levels, improved cholesterol levels, improved heart and lung capacity, improved sleep, and better body composition. Everyone should speak with their doctor before starting or changing an exercise routine.  Biomechanical Limitations Clinical staff conducted group or individual video education with verbal and written material and guidebook.  Patient learns how biomechanical limitations can impact exercise and how we can mitigate and possibly overcome limitations to have an impactful and balanced exercise routine.  Body Composition Clinical staff conducted group or individual video education with verbal and written material and guidebook.  Patient learns that body composition (ratio of muscle mass to fat mass) is a key component to assessing overall fitness, rather than body weight alone. Increased fat mass, especially visceral belly fat, can put [place] at increased risk for metabolic syndrome, type 2 diabetes, heart disease, and even death. It is recommended to combine diet and exercise (cardiovascular and resistance training) to improve your body composition. Seek guidance from your physician and exercise physiologist before implementing an exercise routine.  Exercise Action Plan Clinical staff conducted group or individual video education with verbal and written material and guidebook.  Patient learns the recommended strategies to achieve and enjoy long-term exercise adherence, including variety, self-motivation, self-efficacy, and positive decision making. Benefits of exercise include fitness, good health, weight management, more energy, better sleep, less stress, and overall well-being.  Medical   Heart Disease Risk Reduction Clinical staff conducted group or individual video education with verbal and written material and guidebook.  Patient learns our heart is our most vital organ as it circulates oxygen,  nutrients, white blood cells, and hormones throughout the entire body, and carries waste away. Data supports a plant-based eating plan like the Pritikin Program for its effectiveness in slowing progression of and reversing heart disease. The video provides a number of recommendations to address heart disease.   Metabolic Syndrome and Belly Fat  Clinical staff conducted group or individual video education with verbal and written material and guidebook.  Patient learns what metabolic syndrome is, how it leads to heart disease, and how one can reverse it and keep it from coming back. You have metabolic syndrome if you have 3 of the following 5 criteria: abdominal obesity, high blood pressure, high triglycerides, low HDL cholesterol, and high blood sugar.  Hypertension and Heart Disease Clinical staff conducted group or individual video education with verbal and written material and guidebook.  Patient learns that high blood pressure, or hypertension, is very common in the [place]. Hypertension is largely due to excessive  salt intake, but other important risk factors include being overweight, physical inactivity, drinking too much alcohol, smoking, and not eating enough potassium from fruits and vegetables. High blood pressure is a leading risk factor for heart attack, stroke, congestive heart failure, dementia, kidney failure, and premature death. Long-term effects of excessive salt intake include stiffening of the arteries and thickening of heart muscle and organ damage. Recommendations include ways to reduce hypertension and the risk of heart disease.  Diseases of Our Time - Focusing on Diabetes Clinical staff conducted group or individual video education with verbal and written material and guidebook.  Patient learns why the best way to stop diseases of our time is prevention, through food and other lifestyle changes. Medicine (such as prescription pills and surgeries) is often only a Band-Aid on  the problem, not a long-term solution. Most common diseases of our time include obesity, type 2 diabetes, hypertension, heart disease, and cancer. The Pritikin Program is recommended and has been proven to help reduce, reverse, and/or prevent the damaging effects of metabolic syndrome.  Nutrition   Overview of the Pritikin Eating Plan  Clinical staff conducted group or individual video education with verbal and written material and guidebook.  Patient learns about the Pritikin Eating Plan for disease risk reduction. The Pritikin Eating Plan emphasizes a wide variety of unrefined, minimally-processed carbohydrates, like fruits, vegetables, whole grains, and legumes. Go, Caution, and Stop food choices are explained. Plant-based and lean animal proteins are emphasized. Rationale provided for low sodium intake for blood pressure control, low added sugars for blood sugar stabilization, and low added fats and oils for coronary artery disease risk reduction and weight management.  Calorie Density  Clinical staff conducted group or individual video education with verbal and written material and guidebook.  Patient learns about calorie density and how it impacts the Pritikin Eating Plan. Knowing the characteristics of the food you choose will help you decide whether those foods will lead to weight gain or weight loss, and whether you want to consume more or less of them. Weight loss is usually a side effect of the Pritikin Eating Plan because of its focus on low calorie-dense foods.  Label Reading  Clinical staff conducted group or individual video education with verbal and written material and guidebook.  Patient learns about the Pritikin recommended label reading guidelines and corresponding recommendations regarding calorie density, added sugars, sodium content, and whole grains.  Dining Out - Part 1  Clinical staff conducted group or individual video education with verbal and written material and  guidebook.  Patient learns that restaurant meals can be sabotaging because they can be so high in calories, fat, sodium, and/or sugar. Patient learns recommended strategies on how to positively address this and avoid unhealthy pitfalls.  Facts on Fats  Clinical staff conducted group or individual video education with verbal and written material and guidebook.  Patient learns that lifestyle modifications can be just as effective, if not more so, as many medications for lowering your risk of heart disease. A Pritikin lifestyle can help to reduce your risk of inflammation and atherosclerosis (cholesterol build-up, or plaque, in the artery walls). Lifestyle interventions such as dietary choices and physical activity address the cause of atherosclerosis. A review of the types of fats and their impact on blood cholesterol levels, along with dietary recommendations to reduce fat intake is also included.  Nutrition Action Plan  Clinical staff conducted group or individual video education with verbal and written material and guidebook.  Patient learns how  to incorporate Pritikin recommendations into their lifestyle. Recommendations include planning and keeping personal health goals in mind as an important part of their success.  Healthy Mind-Set    Healthy Minds, Bodies, Hearts  Clinical staff conducted group or individual video education with verbal and written material and guidebook.  Patient learns how to identify when they are stressed. Video will discuss the impact of that stress, as well as the many benefits of stress management. Patient will also be introduced to stress management techniques. The way we think, act, and feel has an impact on our hearts.  How Our Thoughts Can Heal Our Hearts  Clinical staff conducted group or individual video education with verbal and written material and guidebook.  Patient learns that negative thoughts can cause depression and anxiety. This can result in negative  lifestyle behavior and serious health problems. Cognitive behavioral therapy is an effective method to help control our thoughts in order to change and improve our emotional outlook.  Additional Videos:  Exercise    Improving Performance  Clinical staff conducted group or individual video education with verbal and written material and guidebook.  Patient learns to use a non-linear approach by alternating intensity levels and lengths of time spent exercising to help burn more calories and lose more body fat. Cardiovascular exercise helps improve heart health, metabolism, hormonal balance, blood sugar control, and recovery from fatigue. Resistance training improves strength, endurance, balance, coordination, reaction time, metabolism, and muscle mass. Flexibility exercise improves circulation, posture, and balance. Seek guidance from your physician and exercise physiologist before implementing an exercise routine and learn your capabilities and proper form for all exercise.  Introduction to Yoga  Clinical staff conducted group or individual video education with verbal and written material and guidebook.  Patient learns about yoga, a discipline of the coming together of mind, breath, and body. The benefits of yoga include improved flexibility, improved range of motion, better posture and core strength, increased lung function, weight loss, and positive self-image. Yoga's heart health benefits include lowered blood pressure, healthier heart rate, decreased cholesterol and triglyceride levels, improved immune function, and reduced stress. Seek guidance from your physician and exercise physiologist before implementing an exercise routine and learn your capabilities and proper form for all exercise.  Medical   Aging: Enhancing Your Quality of Life  Clinical staff conducted group or individual video education with verbal and written material and guidebook.  Patient learns key strategies and recommendations  to stay in good physical health and enhance quality of life, such as prevention strategies, having an advocate, securing a Health Care Proxy and Power of Attorney, and keeping a list of medications and system for tracking them. It also discusses how to avoid risk for bone loss.  Biology of Weight Control  Clinical staff conducted group or individual video education with verbal and written material and guidebook.  Patient learns that weight gain occurs because we consume more calories than we burn (eating more, moving less). Even if your body weight is normal, you may have higher ratios of fat compared to muscle mass. Too much body fat puts you at increased risk for cardiovascular disease, heart attack, stroke, type 2 diabetes, and obesity-related cancers. In addition to exercise, following the Pritikin Eating Plan can help reduce your risk.  Decoding Lab Results  Clinical staff conducted group or individual video education with verbal and written material and guidebook.  Patient learns that lab test reflects one measurement whose values change over time and are influenced by many factors,  including medication, stress, sleep, exercise, food, hydration, pre-existing medical conditions, and more. It is recommended to use the knowledge from this video to become more involved with your lab results and evaluate your numbers to speak with your doctor.   Diseases of Our Time - Overview  Clinical staff conducted group or individual video education with verbal and written material and guidebook.  Patient learns that according to the CDC, 50% to 70% of chronic diseases (such as obesity, type 2 diabetes, elevated lipids, hypertension, and heart disease) are avoidable through lifestyle improvements including healthier food choices, listening to satiety cues, and increased physical activity.  Sleep Disorders Clinical staff conducted group or individual video education with verbal and written material and  guidebook.  Patient learns how good quality and duration of sleep are important to overall health and well-being. Patient also learns about sleep disorders and how they impact health along with recommendations to address them, including discussing with a physician.  Nutrition  Dining Out - Part 2 Clinical staff conducted group or individual video education with verbal and written material and guidebook.  Patient learns how to plan ahead and communicate in order to maximize their dining experience in a healthy and nutritious manner. Included are recommended food choices based on the type of restaurant the patient is visiting.   Fueling a Banker conducted group or individual video education with verbal and written material and guidebook.  There is a strong connection between our food choices and our health. Diseases like obesity and type 2 diabetes are very prevalent and are in large-part due to lifestyle choices. The Pritikin Eating Plan provides plenty of food and hunger-curbing satisfaction. It is easy to follow, affordable, and helps reduce health risks.  Menu Workshop  Clinical staff conducted group or individual video education with verbal and written material and guidebook.  Patient learns that restaurant meals can sabotage health goals because they are often packed with calories, fat, sodium, and sugar. Recommendations include strategies to plan ahead and to communicate with the manager, chef, or server to help order a healthier meal.  Planning Your Eating Strategy  Clinical staff conducted group or individual video education with verbal and written material and guidebook.  Patient learns about the Pritikin Eating Plan and its benefit of reducing the risk of disease. The Pritikin Eating Plan does not focus on calories. Instead, it emphasizes high-quality, nutrient-rich foods. By knowing the characteristics of the foods, we choose, we can determine their calorie density  and make informed decisions.  Targeting Your Nutrition Priorities  Clinical staff conducted group or individual video education with verbal and written material and guidebook.  Patient learns that lifestyle habits have a tremendous impact on disease risk and progression. This video provides eating and physical activity recommendations based on your personal health goals, such as reducing LDL cholesterol, losing weight, preventing or controlling type 2 diabetes, and reducing high blood pressure.  Vitamins and Minerals  Clinical staff conducted group or individual video education with verbal and written material and guidebook.  Patient learns different ways to obtain key vitamins and minerals, including through a recommended healthy diet. It is important to discuss all supplements you take with your doctor.   Healthy Mind-Set    Smoking Cessation  Clinical staff conducted group or individual video education with verbal and written material and guidebook.  Patient learns that cigarette smoking and tobacco addiction pose a serious health risk which affects millions of people. Stopping smoking will significantly reduce the  risk of heart disease, lung disease, and many forms of cancer. Recommended strategies for quitting are covered, including working with your doctor to develop a successful plan.  Culinary   Becoming a Set designer conducted group or individual video education with verbal and written material and guidebook.  Patient learns that cooking at home can be healthy, cost-effective, quick, and puts them in control. Keys to cooking healthy recipes will include looking at your recipe, assessing your equipment needs, planning ahead, making it simple, choosing cost-effective seasonal ingredients, and limiting the use of added fats, salts, and sugars.  Cooking - Breakfast and Snacks  Clinical staff conducted group or individual video education with verbal and written material  and guidebook.  Patient learns how important breakfast is to satiety and nutrition through the entire day. Recommendations include key foods to eat during breakfast to help stabilize blood sugar levels and to prevent overeating at meals later in the day. Planning ahead is also a key component.  Cooking - Educational psychologist conducted group or individual video education with verbal and written material and guidebook.  Patient learns eating strategies to improve overall health, including an approach to cook more at home. Recommendations include thinking of animal protein as a side on your plate rather than center stage and focusing instead on lower calorie dense options like vegetables, fruits, whole grains, and plant-based proteins, such as beans. Making sauces in large quantities to freeze for later and leaving the skin on your vegetables are also recommended to maximize your experience.  Cooking - Healthy Salads and Dressing Clinical staff conducted group or individual video education with verbal and written material and guidebook.  Patient learns that vegetables, fruits, whole grains, and legumes are the foundations of the Pritikin Eating Plan. Recommendations include how to incorporate each of these in flavorful and healthy salads, and how to create homemade salad dressings. Proper handling of ingredients is also covered. Cooking - Soups and [employer] - Soups and Desserts Clinical staff conducted group or individual video education with verbal and written material and guidebook.  Patient learns that Pritikin soups and desserts make for easy, nutritious, and delicious snacks and meal components that are low in sodium, fat, sugar, and calorie density, while high in vitamins, minerals, and filling fiber. Recommendations include simple and healthy ideas for soups and desserts.   Overview     The Pritikin Solution Program Overview Clinical staff conducted group or individual video  education with verbal and written material and guidebook.  Patient learns that the results of the Pritikin Program have been documented in more than 100 articles published in peer-reviewed journals, and the benefits include reducing risk factors for (and, in some cases, even reversing) high cholesterol, high blood pressure, type 2 diabetes, obesity, and more! An overview of the three key pillars of the Pritikin Program will be covered: eating well, doing regular exercise, and having a healthy mind-set.  WORKSHOPS  Exercise: Exercise Basics: Building Your Action Plan Clinical staff led group instruction and group discussion with PowerPoint presentation and patient guidebook. To enhance the learning environment the use of posters, models and videos may be added. At the conclusion of this workshop, patients will comprehend the difference between physical activity and exercise, as well as the benefits of incorporating both, into their routine. Patients will understand the FITT (Frequency, Intensity, Time, and Type) principle and how to use it to build an exercise action plan. In addition, safety concerns and other  considerations for exercise and cardiac rehab will be addressed by the presenter. The purpose of this lesson is to promote a comprehensive and effective weekly exercise routine in order to improve patients' overall level of fitness.   Managing Heart Disease: Your Path to a Healthier Heart Clinical staff led group instruction and group discussion with PowerPoint presentation and patient guidebook. To enhance the learning environment the use of posters, models and videos may be added.At the conclusion of this workshop, patients will understand the anatomy and physiology of the heart. Additionally, they will understand how Pritikin's three pillars impact the risk factors, the progression, and the management of heart disease.  The purpose of this lesson is to provide a high-level overview of the  heart, heart disease, and how the Pritikin lifestyle positively impacts risk factors.  Exercise Biomechanics Clinical staff led group instruction and group discussion with PowerPoint presentation and patient guidebook. To enhance the learning environment the use of posters, models and videos may be added. Patients will learn how the structural parts of their bodies function and how these functions impact their daily activities, movement, and exercise. Patients will learn how to promote a neutral spine, learn how to manage pain, and identify ways to improve their physical movement in order to promote healthy living. The purpose of this lesson is to expose patients to common physical limitations that impact physical activity. Participants will learn practical ways to adapt and manage aches and pains, and to minimize their effect on regular exercise. Patients will learn how to maintain good posture while sitting, walking, and lifting.  Balance Training and Fall Prevention  Clinical staff led group instruction and group discussion with PowerPoint presentation and patient guidebook. To enhance the learning environment the use of posters, models and videos may be added. At the conclusion of this workshop, patients will understand the importance of their sensorimotor skills (vision, proprioception, and the vestibular system) in maintaining their ability to balance as they age. Patients will apply a variety of balancing exercises that are appropriate for their current level of function. Patients will understand the common causes for poor balance, possible solutions to these problems, and ways to modify their physical environment in order to minimize their fall risk. The purpose of this lesson is to teach patients about the importance of maintaining balance as they age and ways to minimize their risk of falling.  WORKSHOPS   Nutrition:  Fueling a Ship broker led group instruction and  group discussion with PowerPoint presentation and patient guidebook. To enhance the learning environment the use of posters, models and videos may be added. Patients will review the foundational principles of the Pritikin Eating Plan and understand what constitutes a serving size in each of the food groups. Patients will also learn Pritikin-friendly foods that are better choices when away from home and review make-ahead meal and snack options. Calorie density will be reviewed and applied to three nutrition priorities: weight maintenance, weight loss, and weight gain. The purpose of this lesson is to reinforce (in a group setting) the key concepts around what patients are recommended to eat and how to apply these guidelines when away from home by planning and selecting Pritikin-friendly options. Patients will understand how calorie density may be adjusted for different weight management goals.  Mindful Eating  Clinical staff led group instruction and group discussion with PowerPoint presentation and patient guidebook. To enhance the learning environment the use of posters, models and videos may be added. Patients will briefly review the  concepts of the Pritikin Eating Plan and the importance of low-calorie dense foods. The concept of mindful eating will be introduced as well as the importance of paying attention to internal hunger signals. Triggers for non-hunger eating and techniques for dealing with triggers will be explored. The purpose of this lesson is to provide patients with the opportunity to review the basic principles of the Pritikin Eating Plan, discuss the value of eating mindfully and how to measure internal cues of hunger and fullness using the Hunger Scale. Patients will also discuss reasons for non-hunger eating and learn strategies to use for controlling emotional eating.  Targeting Your Nutrition Priorities Clinical staff led group instruction and group discussion with PowerPoint presentation  and patient guidebook. To enhance the learning environment the use of posters, models and videos may be added. Patients will learn how to determine their genetic susceptibility to disease by reviewing their family history. Patients will gain insight into the importance of diet as part of an overall healthy lifestyle in mitigating the impact of genetics and other environmental insults. The purpose of this lesson is to provide patients with the opportunity to assess their personal nutrition priorities by looking at their family history, their own health history and current risk factors. Patients will also be able to discuss ways of prioritizing and modifying the Pritikin Eating Plan for their highest risk areas  Menu  Clinical staff led group instruction and group discussion with PowerPoint presentation and patient guidebook. To enhance the learning environment the use of posters, models and videos may be added. Using menus brought in from [employer], or printed from [employer], patients will apply the Pritikin dining out guidelines that were presented in the [employer] video. Patients will also be able to practice these guidelines in a variety of provided scenarios. The purpose of this lesson is to provide patients with the opportunity to practice hands-on learning of the Pritikin Dining Out guidelines with actual menus and practice scenarios.  Label Reading Clinical staff led group instruction and group discussion with PowerPoint presentation and patient guidebook. To enhance the learning environment the use of posters, models and videos may be added. Patients will review and discuss the Pritikin label reading guidelines presented in Pritikin's Label Reading Educational series video. Using fool labels brought in from local grocery stores and markets, patients will apply the label reading guidelines and determine if the packaged food meet the Pritikin guidelines. The purpose of  this lesson is to provide patients with the opportunity to review, discuss, and practice hands-on learning of the Pritikin Label Reading guidelines with actual packaged food labels. Cooking School  Pritikin's [employer] are designed to teach patients ways to prepare quick, simple, and affordable recipes at home. The importance of nutrition's role in chronic disease risk reduction is reflected in its emphasis in the overall Pritikin program. By learning how to prepare essential core Pritikin Eating Plan recipes, patients will increase control over what they eat; be able to customize the flavor of foods without the use of added salt, sugar, or fat; and improve the quality of the food they consume. By learning a set of core recipes which are easily assembled, quickly prepared, and affordable, patients are more likely to prepare more healthy foods at home. These workshops focus on convenient breakfasts, simple entres, side dishes, and desserts which can be prepared with minimal effort and are consistent with nutrition recommendations for cardiovascular risk reduction. Cooking [employer] are taught by a Investment banker, operational or  registered dietitian (RD) who has been trained by the [employer] team. The chef or RD has a clear understanding of the importance of minimizing - if not completely eliminating - added fat, sugar, and sodium in recipes. Throughout the series of Cooking School Workshop sessions, patients will learn about healthy ingredients and efficient methods of cooking to build confidence in their capability to prepare    Cooking School weekly topics:  Adding Flavor- Sodium-Free  Fast and Healthy Breakfasts  Powerhouse Plant-Based Proteins  Satisfying Salads and Dressings  Simple Sides and Sauces  International Cuisine-Spotlight on the [employer] Zones  Delicious Desserts  Savory Soups  [employer] - Meals in a Astronomer Appetizers and Snacks  Comforting Weekend  Breakfasts  One-Pot Wonders   Fast Evening Meals  Landscape architect Your Pritikin Plate  WORKSHOPS   Healthy Mindset (Psychosocial):  Focused Goals, Sustainable Changes Clinical staff led group instruction and group discussion with PowerPoint presentation and patient guidebook. To enhance the learning environment the use of posters, models and videos may be added. Patients will be able to apply effective goal setting strategies to establish at least one personal goal, and then take consistent, meaningful action toward that goal. They will learn to identify common barriers to achieving personal goals and develop strategies to overcome them. Patients will also gain an understanding of how our mind-set can impact our ability to achieve goals and the importance of cultivating a positive and growth-oriented mind-set. The purpose of this lesson is to provide patients with a deeper understanding of how to set and achieve personal goals, as well as the tools and strategies needed to overcome common obstacles which may arise along the way.  From Head to Heart: The Power of a Healthy Outlook  Clinical staff led group instruction and group discussion with PowerPoint presentation and patient guidebook. To enhance the learning environment the use of posters, models and videos may be added. Patients will be able to recognize and describe the impact of emotions and mood on physical health. They will discover the importance of self-care and explore self-care practices which may work for them. Patients will also learn how to utilize the 4 C's to cultivate a healthier outlook and better manage stress and challenges. The purpose of this lesson is to demonstrate to patients how a healthy outlook is an essential part of maintaining good health, especially as they continue their cardiac rehab journey.  Healthy Sleep for a Healthy Heart Clinical staff led group instruction and group discussion with  PowerPoint presentation and patient guidebook. To enhance the learning environment the use of posters, models and videos may be added. At the conclusion of this workshop, patients will be able to demonstrate knowledge of the importance of sleep to overall health, well-being, and quality of life. They will understand the symptoms of, and treatments for, common sleep disorders. Patients will also be able to identify daytime and nighttime behaviors which impact sleep, and they will be able to apply these tools to help manage sleep-related challenges. The purpose of this lesson is to provide patients with a general overview of sleep and outline the importance of quality sleep. Patients will learn about a few of the most common sleep disorders. Patients will also be introduced to the concept of "sleep hygiene," and discover ways to self-manage certain sleeping problems through simple daily behavior changes. Finally, the workshop will motivate patients by clarifying the links between quality sleep and their goals of heart-healthy living.  Recognizing and Reducing Stress Clinical staff led group instruction and group discussion with PowerPoint presentation and patient guidebook. To enhance the learning environment the use of posters, models and videos may be added. At the conclusion of this workshop, patients will be able to understand the types of stress reactions, differentiate between acute and chronic stress, and recognize the impact that chronic stress has on their health. They will also be able to apply different coping mechanisms, such as reframing negative self-talk. Patients will have the opportunity to practice a variety of stress management techniques, such as deep abdominal breathing, progressive muscle relaxation, and/or guided imagery.  The purpose of this lesson is to educate patients on the role of stress in their lives and to provide healthy techniques for coping with it.  Learning  Barriers/Preferences:  Learning Barriers/Preferences - 12/24/22 1351       Learning Barriers/Preferences   Learning Barriers Sight;Hearing    Learning Preferences Audio;Computer/Internet;Group Instruction;Individual Instruction;Pictoral;Skilled Demonstration;Verbal Instruction;Video;Written Material             Education Topics:  Knowledge Questionnaire Score:  Knowledge Questionnaire Score - 12/24/22 1352       Knowledge Questionnaire Score   Pre Score 21/24             Core Components/Risk Factors/Patient Goals at Admission:  Personal Goals and Risk Factors at Admission - 12/24/22 1353       Core Components/Risk Factors/Patient Goals on Admission   Improve shortness of breath with ADL's Yes    Intervention Provide education, individualized exercise plan and daily activity instruction to help decrease symptoms of SOB with activities of daily living.    Expected Outcomes Short Term: Improve cardiorespiratory fitness to achieve a reduction of symptoms when performing ADLs;Long Term: Be able to perform more ADLs without symptoms or delay the onset of symptoms    Lipids Yes    Intervention Provide education and support for participant on nutrition & aerobic/resistive exercise along with prescribed medications to achieve LDL 70mg , HDL >40mg .    Expected Outcomes Short Term: Participant states understanding of desired cholesterol values and is compliant with medications prescribed. Participant is following exercise prescription and nutrition guidelines.;Long Term: Cholesterol controlled with medications as prescribed, with individualized exercise RX and with personalized nutrition plan. Value goals: LDL < 70mg , HDL > 40 mg.             Core Components/Risk Factors/Patient Goals Review:    Core Components/Risk Factors/Patient Goals at Discharge (Final Review):    ITP Comments:  ITP Comments     Row Name 12/24/22 1025           ITP Comments Armanda Magic, MD:   Medical Director.  Introduction to the Pritikin education program/Intensive cardiac rehab.  Initial oreintation packet reviewed with the patient.                Comments: Participant attended orientation for the cardiac rehabilitation program on  12/24/2022  to perform initial intake and exercise walk test. Patient introduced to the [facility] education and orientation packet was reviewed. Completed 6-minute walk test, measurements, initial ITP, and exercise prescription. Vital signs stable. Telemetry-normal sinus rhythm, asymptomatic. Patient. SaO2 was 90% for most of his walk test, RPD = 1.   Service time was from 1015 to 1230.

## 2022-12-25 NOTE — Telephone Encounter (Signed)
He is now being followed by Dr. Celine Mans.  Please route request to Dr. Celine Mans.

## 2022-12-26 ENCOUNTER — Other Ambulatory Visit: Payer: Self-pay

## 2022-12-26 DIAGNOSIS — Z952 Presence of prosthetic heart valve: Secondary | ICD-10-CM

## 2022-12-30 ENCOUNTER — Encounter (HOSPITAL_COMMUNITY): Admission: RE | Admit: 2022-12-30 | Payer: BC Managed Care – PPO | Source: Ambulatory Visit

## 2022-12-30 DIAGNOSIS — Z952 Presence of prosthetic heart valve: Secondary | ICD-10-CM

## 2022-12-30 NOTE — Progress Notes (Signed)
Daily Session Note  Patient Details  Name: Christopher Arias MRN: [id number] Date of Birth: Dec 31, 1960 Referring Provider:   Flowsheet Row INTENSIVE CARDIAC REHAB ORIENT from 12/24/2022 in [facility]  Referring Provider [name], MD       Encounter Date: 12/30/2022  Check In:  Session Check In - 12/30/22 1521       Check-In   Supervising physician immediately available to respond to emergencies [facility] MD immediately available    Physician(s) Robin Searing, NP    Location [facility]    Staff Present Lorin Picket, MS, ACSM-CEP, CCRP, Exercise Physiologist;Bailey Wallace Cullens, MS, Exercise Physiologist;Olinty Peggye Pitt, MS, ACSM-CEP, Exercise Physiologist;Jetta Dan Humphreys BS, ACSM-CEP, Exercise Physiologist;Johnny Hale Bogus, MS, Exercise Physiologist;Reba Hulett, RN, BSN    Virtual Visit No    Medication changes reported     No    Fall or balance concerns reported    No    Tobacco Cessation No Change    Warm-up and Cool-down Performed as group-led instruction    Resistance Training Performed Yes    VAD Patient? No    PAD/SET Patient? No      Pain Assessment   Currently in Pain? No/denies    Pain Score 0-No pain    Multiple Pain Sites No             Capillary Blood Glucose: No results found for this or any previous visit (from the past 24 hour(s)).   Exercise Prescription Changes - 12/30/22 1701       Response to Exercise   Blood Pressure (Admit) 117/78    Blood Pressure (Exercise) 120/80    Blood Pressure (Exit) 106/70    Heart Rate (Admit) 83 bpm    Heart Rate (Exercise) 114 bpm    Heart Rate (Exit) 89 bpm    Oxygen Saturation (Admit) 96 %    Oxygen Saturation (Exercise) 90 %    Oxygen Saturation (Exit) 96 %    Rating of Perceived Exertion (Exercise) 9    Perceived Dyspnea (Exercise) 0    Symptoms Some mild SOB, resolved with rest, COPD on RA    Comments Pt first day in the [facility]  program    Duration Progress to 30 minutes of  aerobic without signs/symptoms of physical distress    Intensity THRR unchanged      Progression   Progression Continue to progress workloads to maintain intensity without signs/symptoms of physical distress.    Average METs 2.15      Resistance Training   Training Prescription Yes    Weight 3 lbs    Reps 10-15    Time 10 Minutes      Bike   Level 1    Watts 17    Minutes 15    METs 2.4      NuStep   Level 1    SPM 80    Minutes 15    METs 1.9             Social History   Tobacco Use  Smoking Status Former   Current packs/day: 0.00   Average packs/day: 1 pack/day for 20.0 years (20.0 ttl pk-yrs)   Types: Cigarettes   Start date: 08/24/2001   Quit date: 08/24/2021   Years since quitting: 1.3  Smokeless Tobacco Never    Goals Met:  Exercise tolerated well No report of concerns or symptoms today Strength training completed today  Goals Unmet:  Not Applicable  Comments: Pt started cardiac rehab today.  Pt tolerated light exercise without difficulty. VSS, telemetry-Sinus Rhythm t wave inversion, oxygen saturation 90-96% RA asymptomatic.  Medication list reconciled. Pt denies barriers to medication compliance.  PSYCHOSOCIAL ASSESSMENT:  PHQ-2. Pt exhibits positive coping skills, hopeful outlook with supportive family. No psychosocial needs identified at this time, no psychosocial interventions necessary.    Pt enjoys fishing, cooking, reading, politics, yard work  and walking.   Pt oriented to exercise equipment and routine.    Understanding verbalized. Thayer Headings RN BSN    Dr. Armanda Magic is Medical Director for Cardiac Rehab at [facility].

## 2022-12-30 NOTE — Progress Notes (Signed)
Cardiac Individual Treatment Plan  Patient Details  Name: Christopher Arias MRN: [id number] Date of Birth: March 09, 1961 Referring Provider:   Flowsheet Row INTENSIVE CARDIAC REHAB ORIENT from 12/24/2022 in [facility]  Referring Provider [name], MD       Initial Encounter Date:  Flowsheet Row INTENSIVE CARDIAC REHAB ORIENT from 12/24/2022 in [facility]  Date 12/24/22       Visit Diagnosis: 11/04/22 Status post aortic valve replacement  Patient's Home Medications on Admission:  Current Outpatient Medications:    albuterol (PROVENTIL) (2.5 MG/3ML) 0.083% nebulizer solution, Take 3 mLs (2.5 mg total) by nebulization every 6 (six) hours as needed for wheezing or shortness of breath. (Patient not taking: Reported on 12/24/2022), Disp: 75 mL, Rfl: 12   albuterol (VENTOLIN HFA) 108 (90 Base) MCG/ACT inhaler, Inhale 2 puffs into the lungs every 6 (six) hours as needed for wheezing or shortness of breath., Disp: 18 g, Rfl: 3   aspirin EC 81 MG tablet, Take 1 tablet (81 mg total) by mouth daily. Swallow whole., Disp: , Rfl:    atorvastatin (LIPITOR) 20 MG tablet, Take 1 tablet (20 mg total) by mouth daily., Disp: 90 tablet, Rfl: 3   Budeson-Glycopyrrol-Formoterol (BREZTRI AEROSPHERE) 160-9-4.8 MCG/ACT AERO, Inhale 2 puffs into the lungs in the morning and at bedtime., Disp: 10.7 g, Rfl: 5   clonazePAM (KLONOPIN) 1 MG tablet, TAKE 1 TABLET BY MOUTH TWICE A DAY AS NEEDED, Disp: 60 tablet, Rfl: 2   digoxin (LANOXIN) 0.125 MG tablet, Take 1 tablet (0.125 mg total) by mouth daily., Disp: 30 tablet, Rfl: 1   diphenhydramine-acetaminophen (TYLENOL PM) 25-500 MG TABS tablet, Take 2 tablets by mouth at bedtime., Disp: , Rfl:    Fe Fum-Vit C-Vit B12-FA (TRIGELS-F FORTE) CAPS capsule, Take 1 capsule by mouth daily after breakfast., Disp: , Rfl: 0   levothyroxine (SYNTHROID) 100 MCG  tablet, Take 1 tablet (100 mcg total) by mouth daily., Disp: 90 tablet, Rfl: 3   metoprolol succinate (TOPROL-XL) 50 MG 24 hr tablet, Take 1 tablet (50 mg total) by mouth daily. Take with or immediately following a meal., Disp: 90 tablet, Rfl: 3   ondansetron (ZOFRAN-ODT) 4 MG disintegrating tablet, Take 1 tablet (4 mg total) by mouth every 8 (eight) hours as needed for nausea or vomiting., Disp: 30 tablet, Rfl: 1   oxyCODONE (OXY IR/ROXICODONE) 5 MG immediate release tablet, Take 1 tablet (5 mg total) by mouth every 6 (six) hours as needed for severe pain. (Patient not taking: Reported on 12/24/2022), Disp: 28 tablet, Rfl: 0   OXYGEN, Inhale 2-3 L into the lungs at bedtime. As needed during the day, Disp: , Rfl:    sertraline (ZOLOFT) 100 MG tablet, Take 2 tablets (200 mg total) by mouth daily., Disp: 180 tablet, Rfl: 3   tamsulosin (FLOMAX) 0.4 MG CAPS capsule, Take 1 capsule (0.4 mg total) by mouth daily., Disp: 30 capsule, Rfl: 1   varenicline (CHANTIX) 1 MG tablet, TAKE 1 TABLET BY MOUTH TWICE A DAY (Patient taking differently: Take 1 mg by mouth daily.), Disp: 180 tablet, Rfl: 2  Past Medical History: Past Medical History:  Diagnosis Date   ANTERIOR PITUITARY HYPERFUNCTION 11/23/2009   ANXIETY 11/24/2007   COPD (chronic obstructive pulmonary disease) (HCC) 05/31/2021   GERD 11/24/2007   GYNECOMASTIA 11/10/2009   HYPERLIPIDEMIA 11/24/2007   HYPOTHYROIDISM, POST-RADIATION 03/16/2010   PONV (postoperative nausea and vomiting)  Severe aortic insufficiency     Tobacco Use: Social History   Tobacco Use  Smoking Status Former   Current packs/day: 0.00   Average packs/day: 1 pack/day for 20.0 years (20.0 ttl pk-yrs)   Types: Cigarettes   Start date: 08/24/2001   Quit date: 08/24/2021   Years since quitting: 1.3  Smokeless Tobacco Never    Labs: Review Flowsheet  More data exists      Latest Ref Rng & Units 05/30/2021 05/21/2022 08/20/2022 10/31/2022 11/04/2022  Labs for ITP Cardiac  and Pulmonary Rehab  Cholestrol 0 - 200 mg/dL 761  607  - - -  LDL (calc) 0 - 99 mg/dL 371  77  - - -  HDL-C >06.26 mg/dL 94.85  46.27  - - -  Trlycerides 0.0 - 149.0 mg/dL 03.5  00.9  - - -  Hemoglobin A1c 4.8 - 5.6 % 5.7  5.9  - 5.7  -  PH, Arterial 7.35 - 7.45 - - 7.359  7.41  7.324  7.321  7.296  7.318  7.365  7.257  7.373  7.359   PCO2 arterial 32 - 48 mmHg - - 35.6  37  42.7  43.2  46.0  44.2  39.2  45.1  36.1  38.7   Bicarbonate 20.0 - 28.0 mmol/L - - 21.2  21.7  20.1  23.5  22.1  22.4  22.7  22.7  22.6  20.4  21.0  23.0  21.8   TCO2 22 - 32 mmol/L - - 22  23  21   - 23  24  24  24  24  24  22  24  24  22  24  24  23  24  25    Acid-base deficit 0.0 - 2.0 mmol/L - - 4.0  4.0  5.0  0.8  4.0  4.0  4.0  3.0  3.0  7.0  4.0  3.0  3.0   O2 Saturation % - - 72  70  97  98.1  97  94  99  100  100  98  100  78  100     Details       Multiple values from one day are sorted in reverse-chronological order         Capillary Blood Glucose: Lab Results  Component Value Date   GLUCAP 116 (H) 11/06/2022   GLUCAP 115 (H) 11/06/2022   GLUCAP 118 (H) 11/06/2022   GLUCAP 128 (H) 11/06/2022   GLUCAP 110 (H) 11/06/2022     Exercise Target Goals: Exercise Program Goal: Individual exercise prescription set using results from initial 6 min walk test and THRR while considering  patient's activity barriers and safety.   Exercise Prescription Goal: Initial exercise prescription builds to 30-45 minutes a day of aerobic activity, 2-3 days per week.  Home exercise guidelines will be given to patient during program as part of exercise prescription that the participant will acknowledge.  Activity Barriers & Risk Stratification:  Activity Barriers & Cardiac Risk Stratification - 12/24/22 1346       Activity Barriers & Cardiac Risk Stratification   Activity Barriers Arthritis;Joint Problems;Shortness of Breath;Balance Concerns;Other (comment)    Comments Sternal precautions/ S/P PPM    Cardiac Risk  Stratification High             6 Minute Walk:  6 Minute Walk     Row Name 12/24/22 1133         6 Minute Walk   Phase Initial  Distance 1291 feet     Walk Time 6 minutes     # of Rest Breaks 0     MPH 2.45     METS 3.4     RPE 11     Perceived Dyspnea  1     VO2 Peak 11.81     Symptoms Yes (comment)     Comments SOB, RPD = 1     Resting HR 86 bpm     Resting BP 106/70     Resting Oxygen Saturation  95 %     Exercise Oxygen Saturation  during 6 min walk 90 %     Max Ex. HR 105 bpm     Max Ex. BP 130/64     2 Minute Post BP 108/70       Interval HR   1 Minute HR 87     2 Minute HR 102     3 Minute HR 101     4 Minute HR 100     5 Minute HR 103     6 Minute HR 103     2 Minute Post HR 87     Interval Heart Rate? Yes       Interval Oxygen   Interval Oxygen? Yes     Baseline Oxygen Saturation % 95 %     1 Minute Oxygen Saturation % 92 %     1 Minute Liters of Oxygen 0 L     2 Minute Oxygen Saturation % 91 %     2 Minute Liters of Oxygen 0 L     3 Minute Oxygen Saturation % 90 %     3 Minute Liters of Oxygen 0 L     4 Minute Oxygen Saturation % 90 %     4 Minute Liters of Oxygen 0 L     5 Minute Oxygen Saturation % 90 %     5 Minute Liters of Oxygen 0 L     6 Minute Oxygen Saturation % 90 %     6 Minute Liters of Oxygen 0 L     2 Minute Post Oxygen Saturation % 94 %     2 Minute Post Liters of Oxygen 0 L              Oxygen Initial Assessment:   Oxygen Re-Evaluation:   Oxygen Discharge (Final Oxygen Re-Evaluation):   Initial Exercise Prescription:  Initial Exercise Prescription - 12/24/22 1300       Date of Initial Exercise RX and Referring Provider   Date 12/24/22    Referring Provider [name], MD    Expected Discharge Date 03/19/23      Bike   Level 1    Watts 33    Minutes 15    METs 3.4      NuStep   Level 1    SPM 75    Minutes 15    METs 3.4      Prescription Details   Frequency (times per week) 3     Duration Progress to 30 minutes of continuous aerobic without signs/symptoms of physical distress      Intensity   THRR 40-80% of Max Heartrate 64-127    Ratings of Perceived Exertion 11-13    Perceived Dyspnea 0-4      Progression   Progression Continue progressive overload as per policy without signs/symptoms or physical distress.      Resistance Training   Training Prescription Yes  Weight 3 lbs    Reps 10-15             Perform Capillary Blood Glucose checks as needed.  Exercise Prescription Changes:   Exercise Prescription Changes     Row Name 12/30/22 1701             Response to Exercise   Blood Pressure (Admit) 117/78       Blood Pressure (Exercise) 120/80       Blood Pressure (Exit) 106/70       Heart Rate (Admit) 83 bpm       Heart Rate (Exercise) 114 bpm       Heart Rate (Exit) 89 bpm       Oxygen Saturation (Admit) 96 %       Oxygen Saturation (Exercise) 90 %       Oxygen Saturation (Exit) 96 %       Rating of Perceived Exertion (Exercise) 9       Perceived Dyspnea (Exercise) 0       Symptoms Some mild SOB, resolved with rest, COPD on RA       Comments Pt first day in the Pritikin ICR program       Duration Progress to 30 minutes of  aerobic without signs/symptoms of physical distress       Intensity THRR unchanged         Progression   Progression Continue to progress workloads to maintain intensity without signs/symptoms of physical distress.       Average METs 2.15         Resistance Training   Training Prescription Yes       Weight 3 lbs       Reps 10-15       Time 10 Minutes         Bike   Level 1       Watts 17       Minutes 15       METs 2.4         NuStep   Level 1       SPM 80       Minutes 15       METs 1.9                Exercise Comments:   Exercise Comments     Row Name 12/30/22 1707           Exercise Comments Pt first day in the Pritikin ICR program. Pt tolerated exercise well with an average MET level  of 2.15. Pt is learning his THRR, RPE and ExRx. Off to a great start                Exercise Goals and Review:   Exercise Goals     Row Name 12/24/22 1041             Exercise Goals   Increase Physical Activity Yes       Intervention Provide advice, education, support and counseling about physical activity/exercise needs.;Develop an individualized exercise prescription for aerobic and resistive training based on initial evaluation findings, risk stratification, comorbidities and participant's personal goals.       Expected Outcomes Short Term: Attend rehab on a regular basis to increase amount of physical activity.;Long Term: Exercising regularly at least 3-5 days a week.;Long Term: Add in home exercise to make exercise part of routine and to increase amount of physical activity.       Increase Strength  and Stamina Yes       Intervention Provide advice, education, support and counseling about physical activity/exercise needs.;Develop an individualized exercise prescription for aerobic and resistive training based on initial evaluation findings, risk stratification, comorbidities and participant's personal goals.       Expected Outcomes Short Term: Increase workloads from initial exercise prescription for resistance, speed, and METs.;Short Term: Perform resistance training exercises routinely during rehab and add in resistance training at home;Long Term: Improve cardiorespiratory fitness, muscular endurance and strength as measured by increased METs and functional capacity ( )       Able to understand and use rate of perceived exertion (RPE) scale Yes       Intervention Provide education and explanation on how to use RPE scale       Expected Outcomes Short Term: Able to use RPE daily in rehab to express subjective intensity level;Long Term:  Able to use RPE to guide intensity level when exercising independently       Knowledge and understanding of Target Heart Rate Range (THRR) Yes        Intervention Provide education and explanation of THRR including how the numbers were predicted and where they are located for reference       Expected Outcomes Short Term: Able to state/look up THRR;Long Term: Able to use THRR to govern intensity when exercising independently;Short Term: Able to use daily as guideline for intensity in rehab       Understanding of Exercise Prescription Yes       Intervention Provide education, explanation, and written materials on patient's individual exercise prescription       Expected Outcomes Short Term: Able to explain program exercise prescription;Long Term: Able to explain home exercise prescription to exercise independently                Exercise Goals Re-Evaluation :  Exercise Goals Re-Evaluation     Row Name 12/30/22 1705             Exercise Goal Re-Evaluation   Exercise Goals Review Increase Physical Activity;Understanding of Exercise Prescription;Increase Strength and Stamina;Knowledge and understanding of Target Heart Rate Range (THRR);Able to understand and use rate of perceived exertion (RPE) scale       Comments Pt first day in the Pritikin ICR program. Pt tolerated exercise well with an average MET level of 2.15. Pt is learning his THRR, RPE and ExRx. Off to a great start       Expected Outcomes Will continue to monitor and progress workloads as tolerated without sign or symptom                Discharge Exercise Prescription (Final Exercise Prescription Changes):  Exercise Prescription Changes - 12/30/22 1701       Response to Exercise   Blood Pressure (Admit) 117/78    Blood Pressure (Exercise) 120/80    Blood Pressure (Exit) 106/70    Heart Rate (Admit) 83 bpm    Heart Rate (Exercise) 114 bpm    Heart Rate (Exit) 89 bpm    Oxygen Saturation (Admit) 96 %    Oxygen Saturation (Exercise) 90 %    Oxygen Saturation (Exit) 96 %    Rating of Perceived Exertion (Exercise) 9    Perceived Dyspnea (Exercise) 0    Symptoms  Some mild SOB, resolved with rest, COPD on RA    Comments Pt first day in the Pritikin ICR program    Duration Progress to 30 minutes of  aerobic without signs/symptoms of physical  distress    Intensity THRR unchanged      Progression   Progression Continue to progress workloads to maintain intensity without signs/symptoms of physical distress.    Average METs 2.15      Resistance Training   Training Prescription Yes    Weight 3 lbs    Reps 10-15    Time 10 Minutes      Bike   Level 1    Watts 17    Minutes 15    METs 2.4      NuStep   Level 1    SPM 80    Minutes 15    METs 1.9             Nutrition:  Target Goals: Understanding of nutrition guidelines, daily intake of sodium 1500mg , cholesterol 200mg , calories 30% from fat and 7% or less from saturated fats, daily to have 5 or more servings of fruits and vegetables.  Biometrics:  Pre Biometrics - 12/24/22 1045       Pre Biometrics   Waist Circumference 38 inches    Hip Circumference 38.5 inches    Waist to Hip Ratio 0.99 %    Triceps Skinfold 14 mm    % Body Fat 26.4 %    Grip Strength 23 kg    Flexibility 10 in    Single Leg Stand 3.2 seconds              Nutrition Therapy Plan and Nutrition Goals:   Nutrition Assessments:  MEDIFICTS Score Key: ?70 Need to make dietary changes  40-70 Heart Healthy Diet ? 40 Therapeutic Level Cholesterol Diet    Picture Your Plate Scores: <84 Unhealthy dietary pattern with much room for improvement. 41-50 Dietary pattern unlikely to meet recommendations for good health and room for improvement. 51-60 More healthful dietary pattern, with some room for improvement.  >60 Healthy dietary pattern, although there may be some specific behaviors that could be improved.    Nutrition Goals Re-Evaluation:   Nutrition Goals Re-Evaluation:   Nutrition Goals Discharge (Final Nutrition Goals Re-Evaluation):   Psychosocial: Target Goals: Acknowledge presence or  absence of significant depression and/or stress, maximize coping skills, provide positive support system. Participant is able to verbalize types and ability to use techniques and skills needed for reducing stress and depression.  Initial Review & Psychosocial Screening:  Initial Psych Review & Screening - 12/24/22 1350       Initial Review   Current issues with Current Sleep Concerns   Pt voices his sleep issues have been going on for many years     Family Dynamics   Good Support System? Yes   Pt has spouse and friends     Barriers   Psychosocial barriers to participate in program There are no identifiable barriers or psychosocial needs.      Screening Interventions   Interventions Encouraged to exercise             Quality of Life Scores:  Quality of Life - 12/24/22 1351       Quality of Life   Select Quality of Life      Quality of Life Scores   Health/Function Pre 23.73 %    Socioeconomic Pre 30 %    Psych/Spiritual Pre 25.57 %    Family Pre 26.25 %    GLOBAL Pre 25.76 %            Scores of 19 and below usually indicate a poorer quality of life in  these areas.  A difference of  2-3 points is a clinically meaningful difference.  A difference of 2-3 points in the total score of the Quality of Life Index has been associated with significant improvement in overall quality of life, self-image, physical symptoms, and general health in studies assessing change in quality of life.  PHQ-9: Review Flowsheet  More data exists      12/24/2022 11/20/2022 05/14/2022 11/27/2020 03/08/2019  Depression screen PHQ 2/9  Decreased Interest 0 0 0 0 0  Down, Depressed, Hopeless 0 0 0 0 0  PHQ - 2 Score 0 0 0 0 0  Altered sleeping 2 - - - -  Tired, decreased energy 0 - - - -  Change in appetite 0 - - - -  Feeling bad or failure about yourself  0 - - - -  Trouble concentrating 0 - - - -  Moving slowly or fidgety/restless 0 - - - -  Suicidal thoughts 0 - - - -  PHQ-9 Score 2 - - -  -  Difficult doing work/chores Somewhat difficult - - - -    Details           Interpretation of Total Score  Total Score Depression Severity:  1-4 = Minimal depression, 5-9 = Mild depression, 10-14 = Moderate depression, 15-19 = Moderately severe depression, 20-27 = Severe depression   Psychosocial Evaluation and Intervention:   Psychosocial Re-Evaluation:  Psychosocial Re-Evaluation     Row Name 12/30/22 1722             Psychosocial Re-Evaluation   Current issues with None Identified       Comments [place] did not voice any concerns or stressors on his first day of exercise.       Interventions Stress management education;Encouraged to attend Cardiac Rehabilitation for the exercise;Relaxation education       Continue Psychosocial Services  No Follow up required                Psychosocial Discharge (Final Psychosocial Re-Evaluation):  Psychosocial Re-Evaluation - 12/30/22 1722       Psychosocial Re-Evaluation   Current issues with None Identified    Comments Branten did not voice any concerns or stressors on his first day of exercise.    Interventions Stress management education;Encouraged to attend Cardiac Rehabilitation for the exercise;Relaxation education    Continue Psychosocial Services  No Follow up required             Vocational Rehabilitation: Provide vocational rehab assistance to qualifying candidates.   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 Participant will state understanding/return demonstration of topics presented.    Education     Row Name 12/30/22 1600     Education   Cardiac Education Topics  Pritikin   Geographical information systems officer Psychosocial   Psychosocial Workshop Healthy Sleep for a Healthy Heart   Instruction Review Code 1- Verbalizes Understanding   Class Start Time 1400   Class Stop Time 1455   Class Time Calculation (min) 55 min  Core Videos: Exercise    Move It!  Clinical staff conducted group or individual video education with verbal and written material and guidebook.  Patient learns the recommended Pritikin exercise program. Exercise with the goal of living a long, healthy life. Some of the health benefits of exercise include controlled diabetes, healthier blood pressure levels, improved cholesterol levels, improved heart and lung capacity, improved sleep, and better body composition. Everyone should speak with their doctor before starting or changing an exercise routine.  Biomechanical Limitations Clinical staff conducted group or individual video education with verbal and written material and guidebook.  Patient learns how biomechanical limitations can impact exercise and how we can mitigate and possibly overcome limitations to have an impactful and balanced exercise routine.  Body Composition Clinical staff conducted group or individual video education with verbal and written material and guidebook.  Patient learns that body composition (ratio of muscle mass to fat mass) is a key component to assessing overall fitness, rather than body weight alone. Increased fat mass, especially visceral belly fat, can put [place] at increased risk for metabolic syndrome, type 2 diabetes, heart disease, and even death. It is recommended to combine diet and exercise (cardiovascular and resistance training) to improve your body composition. Seek guidance from your physician and exercise physiologist before implementing an exercise routine.  Exercise Action Plan Clinical staff conducted group or individual video education with  verbal and written material and guidebook.  Patient learns the recommended strategies to achieve and enjoy long-term exercise adherence, including variety, self-motivation, self-efficacy, and positive decision making. Benefits of exercise include fitness, good health, weight management, more energy, better sleep, less stress, and overall well-being.  Medical   Heart Disease Risk Reduction Clinical staff conducted group or individual video education with verbal and written material and guidebook.  Patient learns our heart is our most vital organ as it circulates oxygen, nutrients, white blood cells, and hormones throughout the entire body, and carries waste away. Data supports a plant-based eating plan like the Pritikin Program for its effectiveness in slowing progression of and reversing heart disease. The video provides a number of recommendations to address heart disease.   Metabolic Syndrome and Belly Fat  Clinical staff conducted group or individual video education with verbal and written material and guidebook.  Patient learns what metabolic syndrome is, how it leads to heart disease, and how one can reverse it and keep it from coming back. You have metabolic syndrome if you have 3 of the following 5 criteria: abdominal obesity, high blood pressure, high triglycerides, low HDL cholesterol, and high blood sugar.  Hypertension and Heart Disease Clinical staff conducted group or individual video education with verbal and written material and guidebook.  Patient learns that high blood pressure, or hypertension, is very common in the [place]. Hypertension is largely due to excessive salt intake, but other important risk factors include being overweight, physical inactivity, drinking too much alcohol, smoking, and not eating enough potassium from fruits and vegetables. High blood pressure is a leading risk factor for heart attack, stroke, congestive heart failure, dementia, kidney failure, and  premature death. Long-term effects of excessive salt intake include stiffening of the arteries and thickening of heart muscle and organ damage. Recommendations include ways to reduce hypertension and the risk of heart disease.  Diseases of Our Time - Focusing on Diabetes Clinical staff conducted group or individual video education with verbal and written material and guidebook.  Patient learns why the best way to stop diseases of our time  is prevention, through food and other lifestyle changes. Medicine (such as prescription pills and surgeries) is often only a Band-Aid on the problem, not a long-term solution. Most common diseases of our time include obesity, type 2 diabetes, hypertension, heart disease, and cancer. The Pritikin Program is recommended and has been proven to help reduce, reverse, and/or prevent the damaging effects of metabolic syndrome.  Nutrition   Overview of the Pritikin Eating Plan  Clinical staff conducted group or individual video education with verbal and written material and guidebook.  Patient learns about the Pritikin Eating Plan for disease risk reduction. The Pritikin Eating Plan emphasizes a wide variety of unrefined, minimally-processed carbohydrates, like fruits, vegetables, whole grains, and legumes. Go, Caution, and Stop food choices are explained. Plant-based and lean animal proteins are emphasized. Rationale provided for low sodium intake for blood pressure control, low added sugars for blood sugar stabilization, and low added fats and oils for coronary artery disease risk reduction and weight management.  Calorie Density  Clinical staff conducted group or individual video education with verbal and written material and guidebook.  Patient learns about calorie density and how it impacts the Pritikin Eating Plan. Knowing the characteristics of the food you choose will help you decide whether those foods will lead to weight gain or weight loss, and whether you want to  consume more or less of them. Weight loss is usually a side effect of the Pritikin Eating Plan because of its focus on low calorie-dense foods.  Label Reading  Clinical staff conducted group or individual video education with verbal and written material and guidebook.  Patient learns about the Pritikin recommended label reading guidelines and corresponding recommendations regarding calorie density, added sugars, sodium content, and whole grains.  Dining Out - Part 1  Clinical staff conducted group or individual video education with verbal and written material and guidebook.  Patient learns that restaurant meals can be sabotaging because they can be so high in calories, fat, sodium, and/or sugar. Patient learns recommended strategies on how to positively address this and avoid unhealthy pitfalls.  Facts on Fats  Clinical staff conducted group or individual video education with verbal and written material and guidebook.  Patient learns that lifestyle modifications can be just as effective, if not more so, as many medications for lowering your risk of heart disease. A Pritikin lifestyle can help to reduce your risk of inflammation and atherosclerosis (cholesterol build-up, or plaque, in the artery walls). Lifestyle interventions such as dietary choices and physical activity address the cause of atherosclerosis. A review of the types of fats and their impact on blood cholesterol levels, along with dietary recommendations to reduce fat intake is also included.  Nutrition Action Plan  Clinical staff conducted group or individual video education with verbal and written material and guidebook.  Patient learns how to incorporate Pritikin recommendations into their lifestyle. Recommendations include planning and keeping personal health goals in mind as an important part of their success.  Healthy Mind-Set    Healthy Minds, Bodies, Hearts  Clinical staff conducted group or individual video education with  verbal and written material and guidebook.  Patient learns how to identify when they are stressed. Video will discuss the impact of that stress, as well as the many benefits of stress management. Patient will also be introduced to stress management techniques. The way we think, act, and feel has an impact on our hearts.  How Our Thoughts Can Heal Our Hearts  Clinical staff conducted group or individual video  education with verbal and written material and guidebook.  Patient learns that negative thoughts can cause depression and anxiety. This can result in negative lifestyle behavior and serious health problems. Cognitive behavioral therapy is an effective method to help control our thoughts in order to change and improve our emotional outlook.  Additional Videos:  Exercise    Improving Performance  Clinical staff conducted group or individual video education with verbal and written material and guidebook.  Patient learns to use a non-linear approach by alternating intensity levels and lengths of time spent exercising to help burn more calories and lose more body fat. Cardiovascular exercise helps improve heart health, metabolism, hormonal balance, blood sugar control, and recovery from fatigue. Resistance training improves strength, endurance, balance, coordination, reaction time, metabolism, and muscle mass. Flexibility exercise improves circulation, posture, and balance. Seek guidance from your physician and exercise physiologist before implementing an exercise routine and learn your capabilities and proper form for all exercise.  Introduction to Yoga  Clinical staff conducted group or individual video education with verbal and written material and guidebook.  Patient learns about yoga, a discipline of the coming together of mind, breath, and body. The benefits of yoga include improved flexibility, improved range of motion, better posture and core strength, increased lung function, weight loss, and  positive self-image. Yoga's heart health benefits include lowered blood pressure, healthier heart rate, decreased cholesterol and triglyceride levels, improved immune function, and reduced stress. Seek guidance from your physician and exercise physiologist before implementing an exercise routine and learn your capabilities and proper form for all exercise.  Medical   Aging: Enhancing Your Quality of Life  Clinical staff conducted group or individual video education with verbal and written material and guidebook.  Patient learns key strategies and recommendations to stay in good physical health and enhance quality of life, such as prevention strategies, having an advocate, securing a Health Care Proxy and Power of Attorney, and keeping a list of medications and system for tracking them. It also discusses how to avoid risk for bone loss.  Biology of Weight Control  Clinical staff conducted group or individual video education with verbal and written material and guidebook.  Patient learns that weight gain occurs because we consume more calories than we burn (eating more, moving less). Even if your body weight is normal, you may have higher ratios of fat compared to muscle mass. Too much body fat puts you at increased risk for cardiovascular disease, heart attack, stroke, type 2 diabetes, and obesity-related cancers. In addition to exercise, following the Pritikin Eating Plan can help reduce your risk.  Decoding Lab Results  Clinical staff conducted group or individual video education with verbal and written material and guidebook.  Patient learns that lab test reflects one measurement whose values change over time and are influenced by many factors, including medication, stress, sleep, exercise, food, hydration, pre-existing medical conditions, and more. It is recommended to use the knowledge from this video to become more involved with your lab results and evaluate your numbers to speak with your  doctor.   Diseases of Our Time - Overview  Clinical staff conducted group or individual video education with verbal and written material and guidebook.  Patient learns that according to the CDC, 50% to 70% of chronic diseases (such as obesity, type 2 diabetes, elevated lipids, hypertension, and heart disease) are avoidable through lifestyle improvements including healthier food choices, listening to satiety cues, and increased physical activity.  Sleep Disorders Clinical staff conducted group or individual  video education with verbal and written material and guidebook.  Patient learns how good quality and duration of sleep are important to overall health and well-being. Patient also learns about sleep disorders and how they impact health along with recommendations to address them, including discussing with a physician.  Nutrition  Dining Out - Part 2 Clinical staff conducted group or individual video education with verbal and written material and guidebook.  Patient learns how to plan ahead and communicate in order to maximize their dining experience in a healthy and nutritious manner. Included are recommended food choices based on the type of restaurant the patient is visiting.   Fueling a Banker conducted group or individual video education with verbal and written material and guidebook.  There is a strong connection between our food choices and our health. Diseases like obesity and type 2 diabetes are very prevalent and are in large-part due to lifestyle choices. The Pritikin Eating Plan provides plenty of food and hunger-curbing satisfaction. It is easy to follow, affordable, and helps reduce health risks.  Menu Workshop  Clinical staff conducted group or individual video education with verbal and written material and guidebook.  Patient learns that restaurant meals can sabotage health goals because they are often packed with calories, fat, sodium, and sugar.  Recommendations include strategies to plan ahead and to communicate with the manager, chef, or server to help order a healthier meal.  Planning Your Eating Strategy  Clinical staff conducted group or individual video education with verbal and written material and guidebook.  Patient learns about the Pritikin Eating Plan and its benefit of reducing the risk of disease. The Pritikin Eating Plan does not focus on calories. Instead, it emphasizes high-quality, nutrient-rich foods. By knowing the characteristics of the foods, we choose, we can determine their calorie density and make informed decisions.  Targeting Your Nutrition Priorities  Clinical staff conducted group or individual video education with verbal and written material and guidebook.  Patient learns that lifestyle habits have a tremendous impact on disease risk and progression. This video provides eating and physical activity recommendations based on your personal health goals, such as reducing LDL cholesterol, losing weight, preventing or controlling type 2 diabetes, and reducing high blood pressure.  Vitamins and Minerals  Clinical staff conducted group or individual video education with verbal and written material and guidebook.  Patient learns different ways to obtain key vitamins and minerals, including through a recommended healthy diet. It is important to discuss all supplements you take with your doctor.   Healthy Mind-Set    Smoking Cessation  Clinical staff conducted group or individual video education with verbal and written material and guidebook.  Patient learns that cigarette smoking and tobacco addiction pose a serious health risk which affects millions of people. Stopping smoking will significantly reduce the risk of heart disease, lung disease, and many forms of cancer. Recommended strategies for quitting are covered, including working with your doctor to develop a successful plan.  Culinary   Becoming a Corporate investment banker conducted group or individual video education with verbal and written material and guidebook.  Patient learns that cooking at home can be healthy, cost-effective, quick, and puts them in control. Keys to cooking healthy recipes will include looking at your recipe, assessing your equipment needs, planning ahead, making it simple, choosing cost-effective seasonal ingredients, and limiting the use of added fats, salts, and sugars.  Cooking - Breakfast and Snacks  Clinical staff conducted group or individual  video education with verbal and written material and guidebook.  Patient learns how important breakfast is to satiety and nutrition through the entire day. Recommendations include key foods to eat during breakfast to help stabilize blood sugar levels and to prevent overeating at meals later in the day. Planning ahead is also a key component.  Cooking - Educational psychologist conducted group or individual video education with verbal and written material and guidebook.  Patient learns eating strategies to improve overall health, including an approach to cook more at home. Recommendations include thinking of animal protein as a side on your plate rather than center stage and focusing instead on lower calorie dense options like vegetables, fruits, whole grains, and plant-based proteins, such as beans. Making sauces in large quantities to freeze for later and leaving the skin on your vegetables are also recommended to maximize your experience.  Cooking - Healthy Salads and Dressing Clinical staff conducted group or individual video education with verbal and written material and guidebook.  Patient learns that vegetables, fruits, whole grains, and legumes are the foundations of the Pritikin Eating Plan. Recommendations include how to incorporate each of these in flavorful and healthy salads, and how to create homemade salad dressings. Proper handling of ingredients is also covered.  Cooking - Soups and [employer] - Soups and Desserts Clinical staff conducted group or individual video education with verbal and written material and guidebook.  Patient learns that Pritikin soups and desserts make for easy, nutritious, and delicious snacks and meal components that are low in sodium, fat, sugar, and calorie density, while high in vitamins, minerals, and filling fiber. Recommendations include simple and healthy ideas for soups and desserts.   Overview     The Pritikin Solution Program Overview Clinical staff conducted group or individual video education with verbal and written material and guidebook.  Patient learns that the results of the Pritikin Program have been documented in more than 100 articles published in peer-reviewed journals, and the benefits include reducing risk factors for (and, in some cases, even reversing) high cholesterol, high blood pressure, type 2 diabetes, obesity, and more! An overview of the three key pillars of the Pritikin Program will be covered: eating well, doing regular exercise, and having a healthy mind-set.  WORKSHOPS  Exercise: Exercise Basics: Building Your Action Plan Clinical staff led group instruction and group discussion with PowerPoint presentation and patient guidebook. To enhance the learning environment the use of posters, models and videos may be added. At the conclusion of this workshop, patients will comprehend the difference between physical activity and exercise, as well as the benefits of incorporating both, into their routine. Patients will understand the FITT (Frequency, Intensity, Time, and Type) principle and how to use it to build an exercise action plan. In addition, safety concerns and other considerations for exercise and cardiac rehab will be addressed by the presenter. The purpose of this lesson is to promote a comprehensive and effective weekly exercise routine in order to improve patients' overall level of  fitness.   Managing Heart Disease: Your Path to a Healthier Heart Clinical staff led group instruction and group discussion with PowerPoint presentation and patient guidebook. To enhance the learning environment the use of posters, models and videos may be added.At the conclusion of this workshop, patients will understand the anatomy and physiology of the heart. Additionally, they will understand how Pritikin's three pillars impact the risk factors, the progression, and the management of heart disease.  The purpose of  this lesson is to provide a high-level overview of the heart, heart disease, and how the Pritikin lifestyle positively impacts risk factors.  Exercise Biomechanics Clinical staff led group instruction and group discussion with PowerPoint presentation and patient guidebook. To enhance the learning environment the use of posters, models and videos may be added. Patients will learn how the structural parts of their bodies function and how these functions impact their daily activities, movement, and exercise. Patients will learn how to promote a neutral spine, learn how to manage pain, and identify ways to improve their physical movement in order to promote healthy living. The purpose of this lesson is to expose patients to common physical limitations that impact physical activity. Participants will learn practical ways to adapt and manage aches and pains, and to minimize their effect on regular exercise. Patients will learn how to maintain good posture while sitting, walking, and lifting.  Balance Training and Fall Prevention  Clinical staff led group instruction and group discussion with PowerPoint presentation and patient guidebook. To enhance the learning environment the use of posters, models and videos may be added. At the conclusion of this workshop, patients will understand the importance of their sensorimotor skills (vision, proprioception, and the vestibular system)  in maintaining their ability to balance as they age. Patients will apply a variety of balancing exercises that are appropriate for their current level of function. Patients will understand the common causes for poor balance, possible solutions to these problems, and ways to modify their physical environment in order to minimize their fall risk. The purpose of this lesson is to teach patients about the importance of maintaining balance as they age and ways to minimize their risk of falling.  WORKSHOPS   Nutrition:  Fueling a Ship broker led group instruction and group discussion with PowerPoint presentation and patient guidebook. To enhance the learning environment the use of posters, models and videos may be added. Patients will review the foundational principles of the Pritikin Eating Plan and understand what constitutes a serving size in each of the food groups. Patients will also learn Pritikin-friendly foods that are better choices when away from home and review make-ahead meal and snack options. Calorie density will be reviewed and applied to three nutrition priorities: weight maintenance, weight loss, and weight gain. The purpose of this lesson is to reinforce (in a group setting) the key concepts around what patients are recommended to eat and how to apply these guidelines when away from home by planning and selecting Pritikin-friendly options. Patients will understand how calorie density may be adjusted for different weight management goals.  Mindful Eating  Clinical staff led group instruction and group discussion with PowerPoint presentation and patient guidebook. To enhance the learning environment the use of posters, models and videos may be added. Patients will briefly review the concepts of the Pritikin Eating Plan and the importance of low-calorie dense foods. The concept of mindful eating will be introduced as well as the importance of paying attention to internal hunger  signals. Triggers for non-hunger eating and techniques for dealing with triggers will be explored. The purpose of this lesson is to provide patients with the opportunity to review the basic principles of the Pritikin Eating Plan, discuss the value of eating mindfully and how to measure internal cues of hunger and fullness using the Hunger Scale. Patients will also discuss reasons for non-hunger eating and learn strategies to use for controlling emotional eating.  Targeting Your Nutrition Priorities Clinical staff led group instruction  and group discussion with PowerPoint presentation and patient guidebook. To enhance the learning environment the use of posters, models and videos may be added. Patients will learn how to determine their genetic susceptibility to disease by reviewing their family history. Patients will gain insight into the importance of diet as part of an overall healthy lifestyle in mitigating the impact of genetics and other environmental insults. The purpose of this lesson is to provide patients with the opportunity to assess their personal nutrition priorities by looking at their family history, their own health history and current risk factors. Patients will also be able to discuss ways of prioritizing and modifying the Pritikin Eating Plan for their highest risk areas  Menu  Clinical staff led group instruction and group discussion with PowerPoint presentation and patient guidebook. To enhance the learning environment the use of posters, models and videos may be added. Using menus brought in from [employer], or printed from [employer], patients will apply the Pritikin dining out guidelines that were presented in the [employer] video. Patients will also be able to practice these guidelines in a variety of provided scenarios. The purpose of this lesson is to provide patients with the opportunity to practice hands-on learning of the Pritikin Dining Out guidelines  with actual menus and practice scenarios.  Label Reading Clinical staff led group instruction and group discussion with PowerPoint presentation and patient guidebook. To enhance the learning environment the use of posters, models and videos may be added. Patients will review and discuss the Pritikin label reading guidelines presented in Pritikin's Label Reading Educational series video. Using fool labels brought in from local grocery stores and markets, patients will apply the label reading guidelines and determine if the packaged food meet the Pritikin guidelines. The purpose of this lesson is to provide patients with the opportunity to review, discuss, and practice hands-on learning of the Pritikin Label Reading guidelines with actual packaged food labels. Cooking School  Pritikin's [employer] are designed to teach patients ways to prepare quick, simple, and affordable recipes at home. The importance of nutrition's role in chronic disease risk reduction is reflected in its emphasis in the overall Pritikin program. By learning how to prepare essential core Pritikin Eating Plan recipes, patients will increase control over what they eat; be able to customize the flavor of foods without the use of added salt, sugar, or fat; and improve the quality of the food they consume. By learning a set of core recipes which are easily assembled, quickly prepared, and affordable, patients are more likely to prepare more healthy foods at home. These workshops focus on convenient breakfasts, simple entres, side dishes, and desserts which can be prepared with minimal effort and are consistent with nutrition recommendations for cardiovascular risk reduction. Cooking [employer] are taught by a Armed forces logistics/support/administrative officer (RD) who has been trained by the [employer]. The chef or RD has a clear understanding of the importance of minimizing - if not completely eliminating - added fat, sugar, and  sodium in recipes. Throughout the series of Cooking School Workshop sessions, patients will learn about healthy ingredients and efficient methods of cooking to build confidence in their capability to prepare    Cooking School weekly topics:  Adding Flavor- Sodium-Free  Fast and Healthy Breakfasts  Powerhouse Plant-Based Proteins  Satisfying Salads and Dressings  Simple Sides and Sauces  International Cuisine-Spotlight on the [employer] Zones  Delicious Desserts  Savory Soups  [employer] - Meals in  a Snap  Tasty Appetizers and Snacks  Comforting Weekend Breakfasts  One-Pot Wonders   Fast Evening Meals  Landscape architect Your Pritikin Plate  WORKSHOPS   Healthy Mindset (Psychosocial):  Focused Goals, Sustainable Changes Clinical staff led group instruction and group discussion with PowerPoint presentation and patient guidebook. To enhance the learning environment the use of posters, models and videos may be added. Patients will be able to apply effective goal setting strategies to establish at least one personal goal, and then take consistent, meaningful action toward that goal. They will learn to identify common barriers to achieving personal goals and develop strategies to overcome them. Patients will also gain an understanding of how our mind-set can impact our ability to achieve goals and the importance of cultivating a positive and growth-oriented mind-set. The purpose of this lesson is to provide patients with a deeper understanding of how to set and achieve personal goals, as well as the tools and strategies needed to overcome common obstacles which may arise along the way.  From Head to Heart: The Power of a Healthy Outlook  Clinical staff led group instruction and group discussion with PowerPoint presentation and patient guidebook. To enhance the learning environment the use of posters, models and videos may be added. Patients will be able to recognize and  describe the impact of emotions and mood on physical health. They will discover the importance of self-care and explore self-care practices which may work for them. Patients will also learn how to utilize the 4 C's to cultivate a healthier outlook and better manage stress and challenges. The purpose of this lesson is to demonstrate to patients how a healthy outlook is an essential part of maintaining good health, especially as they continue their cardiac rehab journey.  Healthy Sleep for a Healthy Heart Clinical staff led group instruction and group discussion with PowerPoint presentation and patient guidebook. To enhance the learning environment the use of posters, models and videos may be added. At the conclusion of this workshop, patients will be able to demonstrate knowledge of the importance of sleep to overall health, well-being, and quality of life. They will understand the symptoms of, and treatments for, common sleep disorders. Patients will also be able to identify daytime and nighttime behaviors which impact sleep, and they will be able to apply these tools to help manage sleep-related challenges. The purpose of this lesson is to provide patients with a general overview of sleep and outline the importance of quality sleep. Patients will learn about a few of the most common sleep disorders. Patients will also be introduced to the concept of "sleep hygiene," and discover ways to self-manage certain sleeping problems through simple daily behavior changes. Finally, the workshop will motivate patients by clarifying the links between quality sleep and their goals of heart-healthy living.   Recognizing and Reducing Stress Clinical staff led group instruction and group discussion with PowerPoint presentation and patient guidebook. To enhance the learning environment the use of posters, models and videos may be added. At the conclusion of this workshop, patients will be able to understand the types of stress  reactions, differentiate between acute and chronic stress, and recognize the impact that chronic stress has on their health. They will also be able to apply different coping mechanisms, such as reframing negative self-talk. Patients will have the opportunity to practice a variety of stress management techniques, such as deep abdominal breathing, progressive muscle relaxation, and/or guided imagery.  The purpose of this lesson is to educate  patients on the role of stress in their lives and to provide healthy techniques for coping with it.  Learning Barriers/Preferences:  Learning Barriers/Preferences - 12/24/22 1351       Learning Barriers/Preferences   Learning Barriers Sight;Hearing    Learning Preferences Audio;Computer/Internet;Group Instruction;Individual Instruction;Pictoral;Skilled Demonstration;Verbal Instruction;Video;Written Material             Education Topics:  Knowledge Questionnaire Score:  Knowledge Questionnaire Score - 12/24/22 1352       Knowledge Questionnaire Score   Pre Score 21/24             Core Components/Risk Factors/Patient Goals at Admission:  Personal Goals and Risk Factors at Admission - 12/24/22 1353       Core Components/Risk Factors/Patient Goals on Admission   Improve shortness of breath with ADL's Yes    Intervention Provide education, individualized exercise plan and daily activity instruction to help decrease symptoms of SOB with activities of daily living.    Expected Outcomes Short Term: Improve cardiorespiratory fitness to achieve a reduction of symptoms when performing ADLs;Long Term: Be able to perform more ADLs without symptoms or delay the onset of symptoms    Lipids Yes    Intervention Provide education and support for participant on nutrition & aerobic/resistive exercise along with prescribed medications to achieve LDL 70mg , HDL >40mg .    Expected Outcomes Short Term: Participant states understanding of desired cholesterol values  and is compliant with medications prescribed. Participant is following exercise prescription and nutrition guidelines.;Long Term: Cholesterol controlled with medications as prescribed, with individualized exercise RX and with personalized nutrition plan. Value goals: LDL < 70mg , HDL > 40 mg.             Core Components/Risk Factors/Patient Goals Review:   Goals and Risk Factor Review     Row Name 12/30/22 1724             Core Components/Risk Factors/Patient Goals Review   Personal Goals Review Weight Management/Obesity;Improve shortness of breath with ADL's;Lipids       Review Nolin started cardiac rehab on 12/30/22 and did well with exercise. Vital signs were stable. Oxygen saturations ranged from 90-96% on room air       Expected Outcomes Arieh will continue to participate in cardiac rehab for exercise, nutrition and lifestyle modifications                Core Components/Risk Factors/Patient Goals at Discharge (Final Review):   Goals and Risk Factor Review - 12/30/22 1724       Core Components/Risk Factors/Patient Goals Review   Personal Goals Review Weight Management/Obesity;Improve shortness of breath with ADL's;Lipids    Review Octavian started cardiac rehab on 12/30/22 and did well with exercise. Vital signs were stable. Oxygen saturations ranged from 90-96% on room air    Expected Outcomes Clintin will continue to participate in cardiac rehab for exercise, nutrition and lifestyle modifications             ITP Comments:  ITP Comments     Row Name 12/24/22 1025 12/30/22 1722         ITP Comments Armanda Magic, MD:  Medical Director.  Introduction to the [facility] education program/Intensive cardiac rehab.  Initial oreintation packet reviewed with the patient. 30 day ITP Review. Akshaj started cardiac rehab on 12/30/22 and did well with exercise.               Comments: See ITP comments.Thayer Headings RN BSN

## 2023-01-01 ENCOUNTER — Encounter (HOSPITAL_COMMUNITY)
Admission: RE | Admit: 2023-01-01 | Discharge: 2023-01-01 | Disposition: A | Discharge status: Home / Self Care | Payer: BC Managed Care – PPO | Source: Ambulatory Visit | Attending: Cardiology | Admitting: Cardiology

## 2023-01-01 DIAGNOSIS — Z952 Presence of prosthetic heart valve: Secondary | ICD-10-CM

## 2023-01-03 ENCOUNTER — Encounter (HOSPITAL_COMMUNITY)
Admission: RE | Admit: 2023-01-03 | Discharge: 2023-01-03 | Disposition: A | Discharge status: Home / Self Care | Payer: BC Managed Care – PPO | Source: Ambulatory Visit | Attending: Cardiology | Admitting: Cardiology

## 2023-01-03 DIAGNOSIS — Z952 Presence of prosthetic heart valve: Secondary | ICD-10-CM | POA: Insufficient documentation

## 2023-01-05 ENCOUNTER — Other Ambulatory Visit: Payer: Self-pay | Admitting: Physician Assistant

## 2023-01-06 ENCOUNTER — Telehealth: Payer: Self-pay | Admitting: Cardiology

## 2023-01-06 ENCOUNTER — Other Ambulatory Visit: Payer: Self-pay | Admitting: Physician Assistant

## 2023-01-06 ENCOUNTER — Encounter (HOSPITAL_COMMUNITY)
Admission: RE | Admit: 2023-01-06 | Discharge: 2023-01-06 | Disposition: A | Discharge status: Home / Self Care | Payer: BC Managed Care – PPO | Source: Ambulatory Visit | Attending: Cardiology | Admitting: Cardiology

## 2023-01-06 ENCOUNTER — Telehealth: Payer: Self-pay | Admitting: Internal Medicine

## 2023-01-06 ENCOUNTER — Other Ambulatory Visit: Payer: Self-pay

## 2023-01-06 DIAGNOSIS — Z952 Presence of prosthetic heart valve: Secondary | ICD-10-CM

## 2023-01-06 DIAGNOSIS — I442 Atrioventricular block, complete: Secondary | ICD-10-CM

## 2023-01-06 DIAGNOSIS — R000 Tachycardia, unspecified: Secondary | ICD-10-CM

## 2023-01-06 MED ORDER — METOPROLOL SUCCINATE ER 50 MG PO TB24
50.0000 mg | ORAL_TABLET | Freq: Every day | ORAL | 3 refills | Status: DC
Start: 2023-01-06 — End: 2023-11-12

## 2023-01-06 MED ORDER — DIGOXIN 125 MCG PO TABS: 0.1250 mg | ORAL_TABLET | Freq: Every day | ORAL | 1 refills | Status: DC

## 2023-01-06 NOTE — Telephone Encounter (Signed)
Pt wife called and wanting pt med list sent over to his new pharmacy.  [address], [place], [place] [address]  Fax:[phone] Phone [phone]

## 2023-01-06 NOTE — Telephone Encounter (Signed)
Medication sent to pharmacy and anticoag

## 2023-01-06 NOTE — Telephone Encounter (Signed)
Error

## 2023-01-06 NOTE — Telephone Encounter (Signed)
*  STAT* If patient is at the pharmacy, call can be transferred to refill team.   1. Which medications need to be refilled? (please list name of each medication and dose if known)   digoxin (LANOXIN) 0.125 MG tablet  metoprolol succinate (TOPROL-XL) 50 MG 24 hr tablet   2. Would you like to learn more about the convenience, safety, & potential cost savings by using the [facility]?   3. Are you open to using the [facility] (Type [facility]. ).  4. Which pharmacy/location (including street and city if local pharmacy) is medication to be sent to?  [employer] - [facility] [place] - [address]   5. Do they need a 30 day or 90 day supply?  90 day  Wife stated patient is almost out of these medications.

## 2023-01-06 NOTE — Telephone Encounter (Signed)
[employer]

## 2023-01-07 MED ORDER — LEVOTHYROXINE SODIUM 100 MCG PO TABS: 100.0000 ug | ORAL_TABLET | Freq: Every day | ORAL | 3 refills | Status: DC

## 2023-01-07 MED ORDER — ATORVASTATIN CALCIUM 20 MG PO TABS: 20.0000 mg | ORAL_TABLET | Freq: Every day | ORAL | 3 refills | Status: DC

## 2023-01-07 MED ORDER — SERTRALINE HCL 100 MG PO TABS: 200.0000 mg | ORAL_TABLET | Freq: Every day | ORAL | 3 refills | Status: DC

## 2023-01-07 MED ORDER — ONDANSETRON 4 MG PO TBDP: 4.0000 mg | ORAL_TABLET | Freq: Three times a day (TID) | ORAL | 1 refills | Status: DC | PRN

## 2023-01-07 NOTE — Telephone Encounter (Signed)
Sent maintenance meds rx by Dr. Jonny Ruiz to [facility] pharmacy.Marland KitchenRaechel Chute

## 2023-01-07 NOTE — Addendum Note (Signed)
Addended by: Deatra James on: 01/07/2023 11:06 AM   Modules accepted: Orders

## 2023-01-08 ENCOUNTER — Other Ambulatory Visit: Payer: Self-pay | Admitting: Pulmonary Disease

## 2023-01-08 ENCOUNTER — Encounter (HOSPITAL_COMMUNITY)
Admission: RE | Admit: 2023-01-08 | Discharge: 2023-01-08 | Disposition: A | Discharge status: Home / Self Care | Payer: BC Managed Care – PPO | Source: Ambulatory Visit | Attending: Cardiology | Admitting: Cardiology

## 2023-01-08 DIAGNOSIS — Z952 Presence of prosthetic heart valve: Secondary | ICD-10-CM

## 2023-01-09 ENCOUNTER — Institutional Professional Consult (permissible substitution) (INDEPENDENT_AMBULATORY_CARE_PROVIDER_SITE_OTHER): Payer: BC Managed Care – PPO | Admitting: Otolaryngology

## 2023-01-09 ENCOUNTER — Other Ambulatory Visit: Payer: Self-pay | Admitting: Physician Assistant

## 2023-01-09 ENCOUNTER — Other Ambulatory Visit (HOSPITAL_COMMUNITY): Payer: Self-pay

## 2023-01-09 NOTE — Telephone Encounter (Signed)
Denied by plan. Patient has new coverage as of August which med is covered @ $0.00/90 days per test claim!

## 2023-01-10 ENCOUNTER — Encounter (HOSPITAL_COMMUNITY)
Admission: RE | Admit: 2023-01-10 | Discharge: 2023-01-10 | Disposition: A | Discharge status: Home / Self Care | Payer: BC Managed Care – PPO | Source: Ambulatory Visit | Attending: Cardiology | Admitting: Cardiology

## 2023-01-10 ENCOUNTER — Other Ambulatory Visit: Payer: Self-pay | Admitting: Physician Assistant

## 2023-01-10 DIAGNOSIS — Z952 Presence of prosthetic heart valve: Secondary | ICD-10-CM

## 2023-01-10 NOTE — Progress Notes (Signed)
Daily Session Note  Patient Details  Name: Christopher Arias MRN: [id number] Date of Birth: March 20, 1961 Referring Provider:   Flowsheet Row INTENSIVE CARDIAC REHAB ORIENT from 12/24/2022 in  [facility]  Referring Provider [name], MD       Encounter Date: 01/10/2023  Check In:  Session Check In - 01/10/23 1408       Check-In   Supervising physician immediately available to respond to emergencies [facility] MD immediately available    Physician(s) Joni Reining, NP    Location [facility]    Staff Present Valinda Party, MS, Exercise Physiologist;Jetta Wilfred Lacy, ACSM-CEP, Exercise Physiologist; Gerre Scull, RN, Zachery Conch, MS, ACSM-CEP, Exercise Physiologist;David Manus Gunning, MS, ACSM-CEP, CCRP, Exercise Physiologist;Johnny Hale Bogus, MS, Exercise Physiologist    Virtual Visit No    Medication changes reported     No    Fall or balance concerns reported    No    Tobacco Cessation No Change    Warm-up and Cool-down Performed as group-led instruction    Resistance Training Performed Yes    VAD Patient? No    PAD/SET Patient? No      Pain Assessment   Currently in Pain? No/denies    Pain Score 0-No pain    Multiple Pain Sites No             Capillary Blood Glucose: No results found for this or any previous visit (from the past 24 hour(s)).    Social History   Tobacco Use  Smoking Status Former   Current packs/day: 0.00   Average packs/day: 1 pack/day for 20.0 years (20.0 ttl pk-yrs)   Types: Cigarettes   Start date: 08/24/2001   Quit date: 08/24/2021   Years since quitting: 1.3  Smokeless Tobacco Never    Goals Met:  Independence with exercise equipment Strength training completed today  Goals Unmet:  Not Applicable  Comments: Pt with couplets on EKG. Pt increased workload on 2nd exercise station from 2 to 3.5. Decreased workload and pt in NSR. Pt stated no caffeine, a little  extra stress, asymptomatic. BP 144/68. Pt continued exercise and was able to perform weights and cool down without any EKG changes or symptoms.   Dr. Armanda Magic is Medical Director for Cardiac Rehab at [facility].

## 2023-01-12 DIAGNOSIS — J449 Chronic obstructive pulmonary disease, unspecified: Secondary | ICD-10-CM | POA: Diagnosis not present

## 2023-01-13 ENCOUNTER — Encounter (HOSPITAL_COMMUNITY)
Admission: RE | Admit: 2023-01-13 | Discharge: 2023-01-13 | Disposition: A | Discharge status: Home / Self Care | Payer: BC Managed Care – PPO | Source: Ambulatory Visit | Attending: Cardiology | Admitting: Cardiology

## 2023-01-13 DIAGNOSIS — Z952 Presence of prosthetic heart valve: Secondary | ICD-10-CM | POA: Diagnosis not present

## 2023-01-15 ENCOUNTER — Encounter (HOSPITAL_COMMUNITY): Admission: RE | Admit: 2023-01-15 | Payer: BC Managed Care – PPO | Source: Ambulatory Visit

## 2023-01-15 DIAGNOSIS — Z952 Presence of prosthetic heart valve: Secondary | ICD-10-CM | POA: Diagnosis not present

## 2023-01-17 ENCOUNTER — Encounter (HOSPITAL_COMMUNITY)
Admission: RE | Admit: 2023-01-17 | Discharge: 2023-01-17 | Disposition: A | Discharge status: Home / Self Care | Payer: BC Managed Care – PPO | Source: Ambulatory Visit | Attending: Cardiology | Admitting: Cardiology

## 2023-01-17 DIAGNOSIS — Z952 Presence of prosthetic heart valve: Secondary | ICD-10-CM

## 2023-01-17 NOTE — Progress Notes (Signed)
CARDIAC REHAB PHASE 2  Reviewed home exercise with pt today. Pt is tolerating exercise well. Pt will continue to exercise on his own by walking and bike rides with friends for 30-45 minutes per session 4 days a week in addition to the 3 days in CRP2. Advised pt on THRR, RPE scale, hydration and temperature/humidity precautions. Reinforced S/S to stop exercise and when to call MD vs 911. Encouraged warm up cool down and stretches with exercise sessions. Pt verbalized understanding, all questions were answered and pt was given a copy to take home.    Harrie Jeans ACSM-CEP 01/17/2023 5:08 PM

## 2023-01-20 ENCOUNTER — Encounter (HOSPITAL_COMMUNITY)
Admission: RE | Admit: 2023-01-20 | Discharge: 2023-01-20 | Disposition: A | Discharge status: Home / Self Care | Payer: BC Managed Care – PPO | Source: Ambulatory Visit | Attending: Cardiology | Admitting: Cardiology

## 2023-01-20 DIAGNOSIS — Z952 Presence of prosthetic heart valve: Secondary | ICD-10-CM

## 2023-01-21 ENCOUNTER — Telehealth: Payer: Self-pay | Admitting: Internal Medicine

## 2023-01-21 NOTE — Telephone Encounter (Signed)
Patient is calling wanting some advise. He had open heart surgery in January and feels fine but his wife currently has Covid. He just wanted to know if he needed to be seen or anything  that he needed to do besides staying away from her.

## 2023-01-22 ENCOUNTER — Encounter (HOSPITAL_COMMUNITY): Payer: BC Managed Care – PPO

## 2023-01-24 ENCOUNTER — Encounter (HOSPITAL_COMMUNITY): Payer: BC Managed Care – PPO

## 2023-01-27 ENCOUNTER — Encounter (HOSPITAL_COMMUNITY)
Admission: RE | Admit: 2023-01-27 | Discharge: 2023-01-27 | Disposition: A | Discharge status: Home / Self Care | Payer: BC Managed Care – PPO | Source: Ambulatory Visit | Attending: Cardiology | Admitting: Cardiology

## 2023-01-27 DIAGNOSIS — Z952 Presence of prosthetic heart valve: Secondary | ICD-10-CM

## 2023-01-28 NOTE — Progress Notes (Signed)
Cardiac Individual Treatment Plan  Patient Details  Name: Christopher Arias MRN: [id number] Date of Birth: 10/08/1960 Referring Provider:   Flowsheet Row INTENSIVE CARDIAC REHAB ORIENT from 12/24/2022 in [facility], [facility], [facility]  Referring Provider [name], MD       Initial Encounter Date:  Flowsheet Row INTENSIVE CARDIAC REHAB ORIENT from 12/24/2022 in [facility] for Heart, Vascular, & Lung Health  Date 12/24/22       Visit Diagnosis: 11/04/22 Status post aortic valve replacement  Patient's Home Medications on Admission:  Current Outpatient Medications:    albuterol (PROVENTIL) (2.5 MG/3ML) 0.083% nebulizer solution, Take 3 mLs (2.5 mg total) by nebulization every 6 (six) hours as needed for wheezing or shortness of breath. (Patient not taking: Reported on 12/24/2022), Disp: 75 mL, Rfl: 12   albuterol (VENTOLIN HFA) 108 (90 Base) MCG/ACT inhaler, Inhale 2 puffs into the lungs every 6 (six) hours as needed for wheezing or shortness of breath., Disp: 18 g, Rfl: 3   aspirin EC 81 MG tablet, Take 1 tablet (81 mg total) by mouth daily. Swallow whole., Disp: , Rfl:    atorvastatin (LIPITOR) 20 MG tablet, Take 1 tablet (20 mg total) by mouth daily., Disp: 90 tablet, Rfl: 3   Budeson-Glycopyrrol-Formoterol (BREZTRI AEROSPHERE) 160-9-4.8 MCG/ACT AERO, Inhale 2 puffs into the lungs in the morning and at bedtime., Disp: 10.7 g, Rfl: 5   clonazePAM (KLONOPIN) 1 MG tablet, TAKE 1 TABLET BY MOUTH TWICE A DAY AS NEEDED, Disp: 60 tablet, Rfl: 2   digoxin (LANOXIN) 0.125 MG tablet, Take 1 tablet (0.125 mg total) by mouth daily., Disp: 30 tablet, Rfl: 1   diphenhydramine-acetaminophen (TYLENOL PM) 25-500 MG TABS tablet, Take 2 tablets by mouth at bedtime., Disp: , Rfl:    Fe Fum-Vit C-Vit B12-FA (TRIGELS-F FORTE) CAPS capsule, Take 1 capsule by mouth daily after breakfast., Disp: , Rfl: 0   levothyroxine (SYNTHROID) 100 MCG  tablet, Take 1 tablet (100 mcg total) by mouth daily., Disp: 90 tablet, Rfl: 3   metoprolol succinate (TOPROL-XL) 50 MG 24 hr tablet, Take 1 tablet (50 mg total) by mouth daily. Take with or immediately following a meal., Disp: 90 tablet, Rfl: 3   ondansetron (ZOFRAN-ODT) 4 MG disintegrating tablet, Take 1 tablet (4 mg total) by mouth every 8 (eight) hours as needed for nausea or vomiting., Disp: 30 tablet, Rfl: 1   oxyCODONE (OXY IR/ROXICODONE) 5 MG immediate release tablet, Take 1 tablet (5 mg total) by mouth every 6 (six) hours as needed for severe pain. (Patient not taking: Reported on 12/24/2022), Disp: 28 tablet, Rfl: 0   OXYGEN, Inhale 2-3 L into the lungs at bedtime. As needed during the day, Disp: , Rfl:    sertraline (ZOLOFT) 100 MG tablet, Take 2 tablets (200 mg total) by mouth daily., Disp: 180 tablet, Rfl: 3   tamsulosin (FLOMAX) 0.4 MG CAPS capsule, Take 1 capsule (0.4 mg total) by mouth daily., Disp: 30 capsule, Rfl: 1   varenicline (CHANTIX) 1 MG tablet, TAKE 1 TABLET BY MOUTH TWICE A DAY, Disp: 180 tablet, Rfl: 2  Past Medical History: Past Medical History:  Diagnosis Date   ANTERIOR PITUITARY HYPERFUNCTION 11/23/2009   ANXIETY 11/24/2007   COPD (chronic obstructive pulmonary disease) (HCC) 05/31/2021   GERD 11/24/2007   GYNECOMASTIA 11/10/2009   HYPERLIPIDEMIA 11/24/2007   HYPOTHYROIDISM, POST-RADIATION 03/16/2010   PONV (postoperative nausea and vomiting)    Severe aortic insufficiency  Tobacco Use: Social History   Tobacco Use  Smoking Status Former   Current packs/day: 0.00   Average packs/day: 1 pack/day for 20.0 years (20.0 ttl pk-yrs)   Types: Cigarettes   Start date: 08/24/2001   Quit date: 08/24/2021   Years since quitting: 1.4  Smokeless Tobacco Never    Labs: Review Flowsheet  More data exists      Latest Ref Rng & Units 05/30/2021 05/21/2022 08/20/2022 10/31/2022 11/04/2022  Labs for ITP Cardiac and Pulmonary Rehab  Cholestrol 0 - 200 mg/dL 657  846   - - -  LDL (calc) 0 - 99 mg/dL 962  77  - - -  HDL-C >95.28 mg/dL 41.32  44.01  - - -  Trlycerides 0.0 - 149.0 mg/dL 02.7  25.3  - - -  Hemoglobin A1c 4.8 - 5.6 % 5.7  5.9  - 5.7  -  PH, Arterial 7.35 - 7.45 - - 7.359  7.41  7.324  7.321  7.296  7.318  7.365  7.257  7.373  7.359   PCO2 arterial 32 - 48 mmHg - - 35.6  37  42.7  43.2  46.0  44.2  39.2  45.1  36.1  38.7   Bicarbonate 20.0 - 28.0 mmol/L - - 21.2  21.7  20.1  23.5  22.1  22.4  22.7  22.7  22.6  20.4  21.0  23.0  21.8   TCO2 22 - 32 mmol/L - - 22  23  21   - 23  24  24  24  24  24  22  24  24  22  24  24  23  24  25    Acid-base deficit 0.0 - 2.0 mmol/L - - 4.0  4.0  5.0  0.8  4.0  4.0  4.0  3.0  3.0  7.0  4.0  3.0  3.0   O2 Saturation % - - 72  70  97  98.1  97  94  99  100  100  98  100  78  100     Details       Multiple values from one day are sorted in reverse-chronological order         Capillary Blood Glucose: Lab Results  Component Value Date   GLUCAP 116 (H) 11/06/2022   GLUCAP 115 (H) 11/06/2022   GLUCAP 118 (H) 11/06/2022   GLUCAP 128 (H) 11/06/2022   GLUCAP 110 (H) 11/06/2022     Exercise Target Goals: Exercise Program Goal: Individual exercise prescription set using results from initial 6 min walk test and THRR while considering  patient's activity barriers and safety.   Exercise Prescription Goal: Initial exercise prescription builds to 30-45 minutes a day of aerobic activity, 2-3 days per week.  Home exercise guidelines will be given to patient during program as part of exercise prescription that the participant will acknowledge.  Activity Barriers & Risk Stratification:  Activity Barriers & Cardiac Risk Stratification - 12/24/22 1346       Activity Barriers & Cardiac Risk Stratification   Activity Barriers Arthritis;Joint Problems;Shortness of Breath;Balance Concerns;Other (comment)    Comments Sternal precautions/ S/P PPM    Cardiac Risk Stratification High             6 Minute Walk:   6 Minute Walk     Row Name 12/24/22 1133         6 Minute Walk   Phase Initial     Distance 1291 feet  Walk Time 6 minutes     # of Rest Breaks 0     MPH 2.45     METS 3.4     RPE 11     Perceived Dyspnea  1     VO2 Peak 11.81     Symptoms Yes (comment)     Comments SOB, RPD = 1     Resting HR 86 bpm     Resting BP 106/70     Resting Oxygen Saturation  95 %     Exercise Oxygen Saturation  during 6 min walk 90 %     Max Ex. HR 105 bpm     Max Ex. BP 130/64     2 Minute Post BP 108/70       Interval HR   1 Minute HR 87     2 Minute HR 102     3 Minute HR 101     4 Minute HR 100     5 Minute HR 103     6 Minute HR 103     2 Minute Post HR 87     Interval Heart Rate? Yes       Interval Oxygen   Interval Oxygen? Yes     Baseline Oxygen Saturation % 95 %     1 Minute Oxygen Saturation % 92 %     1 Minute Liters of Oxygen 0 L     2 Minute Oxygen Saturation % 91 %     2 Minute Liters of Oxygen 0 L     3 Minute Oxygen Saturation % 90 %     3 Minute Liters of Oxygen 0 L     4 Minute Oxygen Saturation % 90 %     4 Minute Liters of Oxygen 0 L     5 Minute Oxygen Saturation % 90 %     5 Minute Liters of Oxygen 0 L     6 Minute Oxygen Saturation % 90 %     6 Minute Liters of Oxygen 0 L     2 Minute Post Oxygen Saturation % 94 %     2 Minute Post Liters of Oxygen 0 L              Oxygen Initial Assessment:   Oxygen Re-Evaluation:   Oxygen Discharge (Final Oxygen Re-Evaluation):   Initial Exercise Prescription:  Initial Exercise Prescription - 12/24/22 1300       Date of Initial Exercise RX and Referring Provider   Date 12/24/22    Referring Provider [name], MD    Expected Discharge Date 03/19/23      Bike   Level 1    Watts 33    Minutes 15    METs 3.4      NuStep   Level 1    SPM 75    Minutes 15    METs 3.4      Prescription Details   Frequency (times per week) 3    Duration Progress to 30 minutes of continuous aerobic  without signs/symptoms of physical distress      Intensity   THRR 40-80% of Max Heartrate 64-127    Ratings of Perceived Exertion 11-13    Perceived Dyspnea 0-4      Progression   Progression Continue progressive overload as per policy without signs/symptoms or physical distress.      Resistance Training   Training Prescription Yes    Weight 3 lbs    Reps  10-15             Perform Capillary Blood Glucose checks as needed.  Exercise Prescription Changes:   Exercise Prescription Changes     Row Name 12/30/22 1701 01/17/23 1659           Response to Exercise   Blood Pressure (Admit) 117/78 110/66      Blood Pressure (Exercise) 120/80 114/72      Blood Pressure (Exit) 106/70 117/81      Heart Rate (Admit) 83 bpm 74 bpm      Heart Rate (Exercise) 114 bpm 106 bpm      Heart Rate (Exit) 89 bpm 80 bpm      Oxygen Saturation (Admit) 96 % --      Oxygen Saturation (Exercise) 90 % --      Oxygen Saturation (Exit) 96 % --      Rating of Perceived Exertion (Exercise) 9 11      Perceived Dyspnea (Exercise) 0 0      Symptoms Some mild SOB, resolved with rest, COPD on RA --      Comments Pt first day in the [facility] ICR program Reviewed MET's, goals and home ExRx      Duration Progress to 30 minutes of  aerobic without signs/symptoms of physical distress Progress to 30 minutes of  aerobic without signs/symptoms of physical distress      Intensity THRR unchanged THRR unchanged        Progression   Progression Continue to progress workloads to maintain intensity without signs/symptoms of physical distress. Continue to progress workloads to maintain intensity without signs/symptoms of physical distress.      Average METs 2.15 4.15        Resistance Training   Training Prescription Yes Yes      Weight 3 lbs 4 lbs wts      Reps 10-15 10-15      Time 10 Minutes 10 Minutes        Bike   Level 1 3.5      Watts 17 --      Minutes 15 15      METs 2.4 5.1        NuStep   Level  1 3      SPM 80 94      Minutes 15 15      METs 1.9 3.2        Home Exercise Plan   Plans to continue exercise at -- Home (comment)      Frequency -- Add 4 additional days to program exercise sessions.      Initial Home Exercises Provided -- 01/17/23               Exercise Comments:   Exercise Comments     Row Name 12/30/22 1707 01/17/23 1708         Exercise Comments Pt first day in the [facility] program. Pt tolerated exercise well with an average MET level of 2.15. Pt is learning his THRR, RPE and ExRx. Off to a great start Reviewed MET's, goals and home ExRx. Pt tolerated exercise well with an average MET level of 4.15. Pt feels good about his goals, he's back to riding his bike with friends, he has better control of his SOB and is increasing his strength. Pt will add in walking and bike riding 4 days for 30-45 mins per session.               Exercise  Goals and Review:   Exercise Goals     Row Name 12/24/22 1041             Exercise Goals   Increase Physical Activity Yes       Intervention Provide advice, education, support and counseling about physical activity/exercise needs.;Develop an individualized exercise prescription for aerobic and resistive training based on initial evaluation findings, risk stratification, comorbidities and participant's personal goals.       Expected Outcomes Short Term: Attend rehab on a regular basis to increase amount of physical activity.;Long Term: Exercising regularly at least 3-5 days a week.;Long Term: Add in home exercise to make exercise part of routine and to increase amount of physical activity.       Increase Strength and Stamina Yes       Intervention Provide advice, education, support and counseling about physical activity/exercise needs.;Develop an individualized exercise prescription for aerobic and resistive training based on initial evaluation findings, risk stratification, comorbidities and participant's personal  goals.       Expected Outcomes Short Term: Increase workloads from initial exercise prescription for resistance, speed, and METs.;Short Term: Perform resistance training exercises routinely during rehab and add in resistance training at home;Long Term: Improve cardiorespiratory fitness, muscular endurance and strength as measured by increased METs and functional capacity ( )       Able to understand and use rate of perceived exertion (RPE) scale Yes       Intervention Provide education and explanation on how to use RPE scale       Expected Outcomes Short Term: Able to use RPE daily in rehab to express subjective intensity level;Long Term:  Able to use RPE to guide intensity level when exercising independently       Knowledge and understanding of Target Heart Rate Range (THRR) Yes       Intervention Provide education and explanation of THRR including how the numbers were predicted and where they are located for reference       Expected Outcomes Short Term: Able to state/look up THRR;Long Term: Able to use THRR to govern intensity when exercising independently;Short Term: Able to use daily as guideline for intensity in rehab       Understanding of Exercise Prescription Yes       Intervention Provide education, explanation, and written materials on patient's individual exercise prescription       Expected Outcomes Short Term: Able to explain program exercise prescription;Long Term: Able to explain home exercise prescription to exercise independently                Exercise Goals Re-Evaluation :  Exercise Goals Re-Evaluation     Row Name 12/30/22 1705 01/17/23 1704           Exercise Goal Re-Evaluation   Exercise Goals Review Increase Physical Activity;Understanding of Exercise Prescription;Increase Strength and Stamina;Knowledge and understanding of Target Heart Rate Range (THRR);Able to understand and use rate of perceived exertion (RPE) scale Increase Physical Activity;Understanding of  Exercise Prescription;Increase Strength and Stamina;Knowledge and understanding of Target Heart Rate Range (THRR);Able to understand and use rate of perceived exertion (RPE) scale      Comments Pt first day in the [facility] program. Pt tolerated exercise well with an average MET level of 2.15. Pt is learning his THRR, RPE and ExRx. Off to a great start Reviewed MET's, goals and home ExRx. Pt tolerated exercise well with an average MET level of 4.15. Pt feels good about his goals, he's back to riding  his bike with friends, he has better control of his SOB and is increasing his strength. Pt will add in walking and bike riding 4 days for 30-45 mins per session.      Expected Outcomes Will continue to monitor and progress workloads as tolerated without sign or symptom Will continue to monitor and progress workloads as tolerated without sign or symptom               Discharge Exercise Prescription (Final Exercise Prescription Changes):  Exercise Prescription Changes - 01/17/23 1659       Response to Exercise   Blood Pressure (Admit) 110/66    Blood Pressure (Exercise) 114/72    Blood Pressure (Exit) 117/81    Heart Rate (Admit) 74 bpm    Heart Rate (Exercise) 106 bpm    Heart Rate (Exit) 80 bpm    Rating of Perceived Exertion (Exercise) 11    Perceived Dyspnea (Exercise) 0    Comments Reviewed MET's, goals and home ExRx    Duration Progress to 30 minutes of  aerobic without signs/symptoms of physical distress    Intensity THRR unchanged      Progression   Progression Continue to progress workloads to maintain intensity without signs/symptoms of physical distress.    Average METs 4.15      Resistance Training   Training Prescription Yes    Weight 4 lbs wts    Reps 10-15    Time 10 Minutes      Bike   Level 3.5    Minutes 15    METs 5.1      NuStep   Level 3    SPM 94    Minutes 15    METs 3.2      Home Exercise Plan   Plans to continue exercise at Home (comment)     Frequency Add 4 additional days to program exercise sessions.    Initial Home Exercises Provided 01/17/23             Nutrition:  Target Goals: Understanding of nutrition guidelines, daily intake of sodium 1500mg , cholesterol 200mg , calories 30% from fat and 7% or less from saturated fats, daily to have 5 or more servings of fruits and vegetables.  Biometrics:  Pre Biometrics - 12/24/22 1045       Pre Biometrics   Waist Circumference 38 inches    Hip Circumference 38.5 inches    Waist to Hip Ratio 0.99 %    Triceps Skinfold 14 mm    % Body Fat 26.4 %    Grip Strength 23 kg    Flexibility 10 in    Single Leg Stand 3.2 seconds              Nutrition Therapy Plan and Nutrition Goals:  Nutrition Therapy & Goals - 01/01/23 1558       Nutrition Therapy   Diet Heart Healthy Diet    Drug/Food Interactions Statins/Certain Fruits      Personal Nutrition Goals   Nutrition Goal Patient to identify strategies for reducing cardiovascular risk by attending the [facility] education and nutrition series weekly.    Personal Goal #2 Patient to improve diet quality by using the plate method as a guide for meal planning to include lean protein/plant protein, fruits, vegetables, whole grains, nonfat dairy as part of a well-balanced diet.    Personal Goal #3 Patient to reduce sodium to 1500mg  per day    Comments Christopher Arias reports eating a wide variety of foods. He enjoys  cooking and is eager to learn more about heart healthy eating. Patient will benefit from participation in intensive cardiac rehab for nutrition, exercise, and lifestyle modification.      Intervention Plan   Intervention Prescribe, educate and counsel regarding individualized specific dietary modifications aiming towards targeted core components such as weight, hypertension, lipid management, diabetes, heart failure and other comorbidities.;Nutrition handout(s) given to patient.    Expected Outcomes Short Term Goal: Understand  basic principles of dietary content, such as calories, fat, sodium, cholesterol and nutrients.;Long Term Goal: Adherence to prescribed nutrition plan.             Nutrition Assessments:  Nutrition Assessments - 01/02/23 0944       Rate Your Plate Scores   Pre Score 54            MEDIFICTS Score Key: ?70 Need to make dietary changes  40-70 Heart Healthy Diet ? 40 Therapeutic Level Cholesterol Diet   Flowsheet Row INTENSIVE CARDIAC REHAB from 01/01/2023 in [facility] Heart, V[facility], & Lung Health  Picture Your Plate Total Score on Admission 54      Picture Your Plate Scores: <70 Unhealthy dietary pattern with much room for improvement. 41-50 Dietary pattern unlikely to meet recommendations for good health and room for improvement. 51-60 More healthful dietary pattern, with some room for improvement.  >60 Healthy dietary pattern, although there may be some specific behaviors that could be improved.    Nutrition Goals Re-Evaluation:  Nutrition Goals Re-Evaluation     Row Name 01/01/23 1558             Goals   Current Weight 164 lb 10.9 oz (74.7 kg)       Comment lipids WNL, A1c 5.7       Expected Outcome Welles reports eating a wide variety of foods. He enjoys cooking and is eager to learn more about heart healthy eating. Patient will benefit from participation in intensive cardiac rehab for nutrition, exercise, and lifestyle modification.                Nutrition Goals Re-Evaluation:  Nutrition Goals Re-Evaluation     Row Name 01/01/23 1558             Goals   Current Weight 164 lb 10.9 oz (74.7 kg)       Comment lipids WNL, A1c 5.7       Expected Outcome Christopher Arias reports eating a wide variety of foods. He enjoys cooking and is eager to learn more about heart healthy eating. Patient will benefit from participation in intensive cardiac rehab for nutrition, exercise, and lifestyle modification.                Nutrition  Goals Discharge (Final Nutrition Goals Re-Evaluation):  Nutrition Goals Re-Evaluation - 01/01/23 1558       Goals   Current Weight 164 lb 10.9 oz (74.7 kg)    Comment lipids WNL, A1c 5.7    Expected Outcome Christopher Arias reports eating a wide variety of foods. He enjoys cooking and is eager to learn more about heart healthy eating. Patient will benefit from participation in intensive cardiac rehab for nutrition, exercise, and lifestyle modification.             Psychosocial: Target Goals: Acknowledge presence or absence of significant depression and/or stress, maximize coping skills, provide positive support system. Participant is able to verbalize types and ability to use techniques and skills needed for reducing stress and depression.  Initial Review & Psychosocial Screening:  Initial Psych Review & Screening - 12/24/22 1350       Initial Review   Current issues with Current Sleep Concerns   Pt voices his sleep issues have been going on for many years     Family Dynamics   Good Support System? Yes   Pt has spouse and friends     Barriers   Psychosocial barriers to participate in program There are no identifiable barriers or psychosocial needs.      Screening Interventions   Interventions Encouraged to exercise             Quality of Life Scores:  Quality of Life - 12/24/22 1351       Quality of Life   Select Quality of Life      Quality of Life Scores   Health/Function Pre 23.73 %    Socioeconomic Pre 30 %    Psych/Spiritual Pre 25.57 %    Family Pre 26.25 %    GLOBAL Pre 25.76 %            Scores of 19 and below usually indicate a poorer quality of life in these areas.  A difference of  2-3 points is a clinically meaningful difference.  A difference of 2-3 points in the total score of the Quality of Life Index has been associated with significant improvement in overall quality of life, self-image, physical symptoms, and general health in studies assessing change  in quality of life.  PHQ-9: Review Flowsheet  More data exists      12/24/2022 11/20/2022 05/14/2022 11/27/2020 03/08/2019  Depression screen PHQ 2/9  Decreased Interest 0 0 0 0 0  Down, Depressed, Hopeless 0 0 0 0 0  PHQ - 2 Score 0 0 0 0 0  Altered sleeping 2 - - - -  Tired, decreased energy 0 - - - -  Change in appetite 0 - - - -  Feeling bad or failure about yourself  0 - - - -  Trouble concentrating 0 - - - -  Moving slowly or fidgety/restless 0 - - - -  Suicidal thoughts 0 - - - -  PHQ-9 Score 2 - - - -  Difficult doing work/chores Somewhat difficult - - - -    Details           Interpretation of Total Score  Total Score Depression Severity:  1-4 = Minimal depression, 5-9 = Mild depression, 10-14 = Moderate depression, 15-19 = Moderately severe depression, 20-27 = Severe depression   Psychosocial Evaluation and Intervention:   Psychosocial Re-Evaluation:  Psychosocial Re-Evaluation     Row Name 12/30/22 1722 01/28/23 1338           Psychosocial Re-Evaluation   Current issues with None Identified None Identified      Comments Christopher Arias did not voice any concerns or stressors on his first day of exercise. --      Interventions Stress management education;Encouraged to attend Cardiac Rehabilitation for the exercise;Relaxation education Stress management education;Encouraged to attend Cardiac Rehabilitation for the exercise;Relaxation education      Continue Psychosocial Services  No Follow up required No Follow up required               Psychosocial Discharge (Final Psychosocial Re-Evaluation):  Psychosocial Re-Evaluation - 01/28/23 1338       Psychosocial Re-Evaluation   Current issues with None Identified    Interventions Stress management education;Encouraged to attend Cardiac Rehabilitation for the exercise;Relaxation  education    Continue Psychosocial Services  No Follow up required             Vocational Rehabilitation: Provide vocational rehab  assistance to qualifying candidates.   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 Participant will state understanding/return demonstration of topics presented.    Education     Row Name 12/30/22 1600     Education   Cardiac Education Topics [facility]   Geographical information systems officer Psychosocial   Psychosocial Workshop Healthy Sleep for a Healthy Heart   Instruction Review Code 1- Verbalizes Understanding   Class Start Time 1400   Class Stop Time 1455   Class Time Calculation (min) 55 min    Row Name 01/03/23 1500     Education   Cardiac Education Topics [facility]   Psychologist, forensic Exercise Education   Exercise Education Improving Performance   Instruction Review Code 1- Verbalizes Understanding   Class Start Time 1400   Class Stop Time 1455   Class Time Calculation (min) 55 min    Row Name 01/06/23 1500     Education   Cardiac Education Topics Pritikin   Glass blower/designer Nutrition   Nutrition Workshop Fueling a Forensic psychologist   Instruction Review Code 1- Tax inspector   Class Start Time 1400   Class Stop Time 1452   Class Time Calculation (min) 52 min    Row Name 01/08/23 1500     Education   Cardiac Education Topics Pritikin   Customer service manager   Weekly Topic Simple Sides and Sauces   Instruction Review Code 1- Verbalizes Understanding   Class Start Time 1400   Class Stop Time 1445   Class Time Calculation (min) 45 min     Row Name 01/10/23 1400     Education   Cardiac Education Topics Pritikin   Nurse, children's Exercise Physiologist   Select Psychosocial   Psychosocial How Our Thoughts Can Heal Our Hearts   Instruction Review Code 1- Verbalizes Understanding   Class Start Time 1400   Class Stop Time 1450   Class Time Calculation (min) 50 min    Row Name 01/13/23 1500     Workshops   Educator Exercise Physiologist   Select Exercise   Exercise Workshop Managing Heart Disease: Your Path to a Healthier Heart   Instruction Review Code 1- Verbalizes Understanding   Class Start Time 1415   Class Stop Time 1510   Class Time Calculation (min) 55 min    Row Name 01/15/23 1500     Education   Cardiac Education Topics Pritikin   Customer service manager   Weekly Topic Powerhouse Plant-Based Proteins   Instruction Review Code 1- Verbalizes Understanding   Class Start Time 1400  Class Stop Time 1440   Class Time Calculation (min) 40 min    Row Name 01/17/23 1400     Education   Cardiac Education Topics Pritikin   Psychologist, forensic General Education   General Education Hypertension and Heart Disease   Instruction Review Code 1- Verbalizes Understanding   Class Start Time 1410   Class Stop Time 1445   Class Time Calculation (min) 35 min    Row Name 01/20/23 1500     Education   Cardiac Education Topics Pritikin   Geographical information systems officer Psychosocial   Psychosocial Workshop From Head to Heart: The Power of a Healthy Outlook   Instruction Review Code 1- Verbalizes Understanding   Class Start Time 1400   Class Stop Time 1446   Class Time Calculation (min) 46 min    Row Name 01/27/23 1500     Education   Cardiac Education Topics Pritikin   Sales promotion account executive Nutrition   Nutrition Overview of the Pritikin Eating Plan   Instruction Review Code 1- Verbalizes Understanding   Class Start Time 1400   Class Stop Time 1452   Class Time Calculation (min) 52 min            Core Videos: Exercise    Move It!  Clinical staff conducted group or individual video education with verbal and written material and guidebook.  Patient learns the recommended Pritikin exercise program. Exercise with the goal of living a long, healthy life. Some of the health benefits of exercise include controlled diabetes, healthier blood pressure levels, improved cholesterol levels, improved heart and lung capacity, improved sleep, and better body composition. Everyone should speak with their doctor before starting or changing an exercise routine.  Biomechanical Limitations Clinical staff conducted group or individual video education with verbal and written material and guidebook.  Patient learns how biomechanical limitations can impact exercise and how we can mitigate and possibly overcome limitations to have an impactful and balanced exercise routine.  Body Composition Clinical staff conducted group or individual video education with verbal and written material and guidebook.  Patient learns that body composition (ratio of muscle mass to fat mass) is a key component to assessing overall fitness, rather than body weight alone. Increased fat mass, especially visceral belly fat, can put [place] at increased risk for metabolic syndrome, type 2 diabetes, heart disease, and even death. It is recommended to combine diet and exercise (cardiovascular and resistance training) to improve your body composition. Seek guidance from your physician and exercise physiologist before implementing an exercise routine.  Exercise Action Plan Clinical staff conducted group or individual video education with verbal and written material and guidebook.  Patient learns the recommended strategies to  achieve and enjoy long-term exercise adherence, including variety, self-motivation, self-efficacy, and positive decision making. Benefits of exercise include fitness, good health, weight management, more energy, better sleep, less stress, and overall well-being.  Medical   Heart Disease Risk Reduction Clinical staff conducted group or individual video education with verbal and written material and guidebook.  Patient learns our heart is our most vital organ as it circulates oxygen, nutrients, white blood cells, and hormones throughout the entire body, and carries waste away. Data supports a plant-based eating plan like the Pritikin Program for its effectiveness in  slowing progression of and reversing heart disease. The video provides a number of recommendations to address heart disease.   Metabolic Syndrome and Belly Fat  Clinical staff conducted group or individual video education with verbal and written material and guidebook.  Patient learns what metabolic syndrome is, how it leads to heart disease, and how one can reverse it and keep it from coming back. You have metabolic syndrome if you have 3 of the following 5 criteria: abdominal obesity, high blood pressure, high triglycerides, low HDL cholesterol, and high blood sugar.  Hypertension and Heart Disease Clinical staff conducted group or individual video education with verbal and written material and guidebook.  Patient learns that high blood pressure, or hypertension, is very common in the [place]. Hypertension is largely due to excessive salt intake, but other important risk factors include being overweight, physical inactivity, drinking too much alcohol, smoking, and not eating enough potassium from fruits and vegetables. High blood pressure is a leading risk factor for heart attack, stroke, congestive heart failure, dementia, kidney failure, and premature death. Long-term effects of excessive salt intake include stiffening of the  arteries and thickening of heart muscle and organ damage. Recommendations include ways to reduce hypertension and the risk of heart disease.  Diseases of Our Time - Focusing on Diabetes Clinical staff conducted group or individual video education with verbal and written material and guidebook.  Patient learns why the best way to stop diseases of our time is prevention, through food and other lifestyle changes. Medicine (such as prescription pills and surgeries) is often only a Band-Aid on the problem, not a long-term solution. Most common diseases of our time include obesity, type 2 diabetes, hypertension, heart disease, and cancer. The Pritikin Program is recommended and has been proven to help reduce, reverse, and/or prevent the damaging effects of metabolic syndrome.  Nutrition   Overview of the Pritikin Eating Plan  Clinical staff conducted group or individual video education with verbal and written material and guidebook.  Patient learns about the Pritikin Eating Plan for disease risk reduction. The Pritikin Eating Plan emphasizes a wide variety of unrefined, minimally-processed carbohydrates, like fruits, vegetables, whole grains, and legumes. Go, Caution, and Stop food choices are explained. Plant-based and lean animal proteins are emphasized. Rationale provided for low sodium intake for blood pressure control, low added sugars for blood sugar stabilization, and low added fats and oils for coronary artery disease risk reduction and weight management.  Calorie Density  Clinical staff conducted group or individual video education with verbal and written material and guidebook.  Patient learns about calorie density and how it impacts the Pritikin Eating Plan. Knowing the characteristics of the food you choose will help you decide whether those foods will lead to weight gain or weight loss, and whether you want to consume more or less of them. Weight loss is usually a side effect of the Pritikin  Eating Plan because of its focus on low calorie-dense foods.  Label Reading  Clinical staff conducted group or individual video education with verbal and written material and guidebook.  Patient learns about the Pritikin recommended label reading guidelines and corresponding recommendations regarding calorie density, added sugars, sodium content, and whole grains.  Dining Out - Part 1  Clinical staff conducted group or individual video education with verbal and written material and guidebook.  Patient learns that restaurant meals can be sabotaging because they can be so high in calories, fat, sodium, and/or sugar. Patient learns recommended strategies on how to positively  address this and avoid unhealthy pitfalls.  Facts on Fats  Clinical staff conducted group or individual video education with verbal and written material and guidebook.  Patient learns that lifestyle modifications can be just as effective, if not more so, as many medications for lowering your risk of heart disease. A Pritikin lifestyle can help to reduce your risk of inflammation and atherosclerosis (cholesterol build-up, or plaque, in the artery walls). Lifestyle interventions such as dietary choices and physical activity address the cause of atherosclerosis. A review of the types of fats and their impact on blood cholesterol levels, along with dietary recommendations to reduce fat intake is also included.  Nutrition Action Plan  Clinical staff conducted group or individual video education with verbal and written material and guidebook.  Patient learns how to incorporate Pritikin recommendations into their lifestyle. Recommendations include planning and keeping personal health goals in mind as an important part of their success.  Healthy Mind-Set    Healthy Minds, Bodies, Hearts  Clinical staff conducted group or individual video education with verbal and written material and guidebook.  Patient learns how to identify when they  are stressed. Video will discuss the impact of that stress, as well as the many benefits of stress management. Patient will also be introduced to stress management techniques. The way we think, act, and feel has an impact on our hearts.  How Our Thoughts Can Heal Our Hearts  Clinical staff conducted group or individual video education with verbal and written material and guidebook.  Patient learns that negative thoughts can cause depression and anxiety. This can result in negative lifestyle behavior and serious health problems. Cognitive behavioral therapy is an effective method to help control our thoughts in order to change and improve our emotional outlook.  Additional Videos:  Exercise    Improving Performance  Clinical staff conducted group or individual video education with verbal and written material and guidebook.  Patient learns to use a non-linear approach by alternating intensity levels and lengths of time spent exercising to help burn more calories and lose more body fat. Cardiovascular exercise helps improve heart health, metabolism, hormonal balance, blood sugar control, and recovery from fatigue. Resistance training improves strength, endurance, balance, coordination, reaction time, metabolism, and muscle mass. Flexibility exercise improves circulation, posture, and balance. Seek guidance from your physician and exercise physiologist before implementing an exercise routine and learn your capabilities and proper form for all exercise.  Introduction to Yoga  Clinical staff conducted group or individual video education with verbal and written material and guidebook.  Patient learns about yoga, a discipline of the coming together of mind, breath, and body. The benefits of yoga include improved flexibility, improved range of motion, better posture and core strength, increased lung function, weight loss, and positive self-image. Yoga's heart health benefits include lowered blood pressure,  healthier heart rate, decreased cholesterol and triglyceride levels, improved immune function, and reduced stress. Seek guidance from your physician and exercise physiologist before implementing an exercise routine and learn your capabilities and proper form for all exercise.  Medical   Aging: Enhancing Your Quality of Life  Clinical staff conducted group or individual video education with verbal and written material and guidebook.  Patient learns key strategies and recommendations to stay in good physical health and enhance quality of life, such as prevention strategies, having an advocate, securing a Health Care Proxy and Power of Attorney, and keeping a list of medications and system for tracking them. It also discusses how to avoid risk for  bone loss.  Biology of Weight Control  Clinical staff conducted group or individual video education with verbal and written material and guidebook.  Patient learns that weight gain occurs because we consume more calories than we burn (eating more, moving less). Even if your body weight is normal, you may have higher ratios of fat compared to muscle mass. Too much body fat puts you at increased risk for cardiovascular disease, heart attack, stroke, type 2 diabetes, and obesity-related cancers. In addition to exercise, following the Pritikin Eating Plan can help reduce your risk.  Decoding Lab Results  Clinical staff conducted group or individual video education with verbal and written material and guidebook.  Patient learns that lab test reflects one measurement whose values change over time and are influenced by many factors, including medication, stress, sleep, exercise, food, hydration, pre-existing medical conditions, and more. It is recommended to use the knowledge from this video to become more involved with your lab results and evaluate your numbers to speak with your doctor.   Diseases of Our Time - Overview  Clinical staff conducted group or  individual video education with verbal and written material and guidebook.  Patient learns that according to the CDC, 50% to 70% of chronic diseases (such as obesity, type 2 diabetes, elevated lipids, hypertension, and heart disease) are avoidable through lifestyle improvements including healthier food choices, listening to satiety cues, and increased physical activity.  Sleep Disorders Clinical staff conducted group or individual video education with verbal and written material and guidebook.  Patient learns how good quality and duration of sleep are important to overall health and well-being. Patient also learns about sleep disorders and how they impact health along with recommendations to address them, including discussing with a physician.  Nutrition  Dining Out - Part 2 Clinical staff conducted group or individual video education with verbal and written material and guidebook.  Patient learns how to plan ahead and communicate in order to maximize their dining experience in a healthy and nutritious manner. Included are recommended food choices based on the type of restaurant the patient is visiting.   Fueling a Banker conducted group or individual video education with verbal and written material and guidebook.  There is a strong connection between our food choices and our health. Diseases like obesity and type 2 diabetes are very prevalent and are in large-part due to lifestyle choices. The Pritikin Eating Plan provides plenty of food and hunger-curbing satisfaction. It is easy to follow, affordable, and helps reduce health risks.  Menu Workshop  Clinical staff conducted group or individual video education with verbal and written material and guidebook.  Patient learns that restaurant meals can sabotage health goals because they are often packed with calories, fat, sodium, and sugar. Recommendations include strategies to plan ahead and to communicate with the manager,  chef, or server to help order a healthier meal.  Planning Your Eating Strategy  Clinical staff conducted group or individual video education with verbal and written material and guidebook.  Patient learns about the Pritikin Eating Plan and its benefit of reducing the risk of disease. The Pritikin Eating Plan does not focus on calories. Instead, it emphasizes high-quality, nutrient-rich foods. By knowing the characteristics of the foods, we choose, we can determine their calorie density and make informed decisions.  Targeting Your Nutrition Priorities  Clinical staff conducted group or individual video education with verbal and written material and guidebook.  Patient learns that lifestyle habits have a tremendous impact on  disease risk and progression. This video provides eating and physical activity recommendations based on your personal health goals, such as reducing LDL cholesterol, losing weight, preventing or controlling type 2 diabetes, and reducing high blood pressure.  Vitamins and Minerals  Clinical staff conducted group or individual video education with verbal and written material and guidebook.  Patient learns different ways to obtain key vitamins and minerals, including through a recommended healthy diet. It is important to discuss all supplements you take with your doctor.   Healthy Mind-Set    Smoking Cessation  Clinical staff conducted group or individual video education with verbal and written material and guidebook.  Patient learns that cigarette smoking and tobacco addiction pose a serious health risk which affects millions of people. Stopping smoking will significantly reduce the risk of heart disease, lung disease, and many forms of cancer. Recommended strategies for quitting are covered, including working with your doctor to develop a successful plan.  Culinary   Becoming a Set designer conducted group or individual video education with verbal and written  material and guidebook.  Patient learns that cooking at home can be healthy, cost-effective, quick, and puts them in control. Keys to cooking healthy recipes will include looking at your recipe, assessing your equipment needs, planning ahead, making it simple, choosing cost-effective seasonal ingredients, and limiting the use of added fats, salts, and sugars.  Cooking - Breakfast and Snacks  Clinical staff conducted group or individual video education with verbal and written material and guidebook.  Patient learns how important breakfast is to satiety and nutrition through the entire day. Recommendations include key foods to eat during breakfast to help stabilize blood sugar levels and to prevent overeating at meals later in the day. Planning ahead is also a key component.  Cooking - Educational psychologist conducted group or individual video education with verbal and written material and guidebook.  Patient learns eating strategies to improve overall health, including an approach to cook more at home. Recommendations include thinking of animal protein as a side on your plate rather than center stage and focusing instead on lower calorie dense options like vegetables, fruits, whole grains, and plant-based proteins, such as beans. Making sauces in large quantities to freeze for later and leaving the skin on your vegetables are also recommended to maximize your experience.  Cooking - Healthy Salads and Dressing Clinical staff conducted group or individual video education with verbal and written material and guidebook.  Patient learns that vegetables, fruits, whole grains, and legumes are the foundations of the Pritikin Eating Plan. Recommendations include how to incorporate each of these in flavorful and healthy salads, and how to create homemade salad dressings. Proper handling of ingredients is also covered. Cooking - Soups and [employer] - Soups and Desserts Clinical staff conducted  group or individual video education with verbal and written material and guidebook.  Patient learns that Pritikin soups and desserts make for easy, nutritious, and delicious snacks and meal components that are low in sodium, fat, sugar, and calorie density, while high in vitamins, minerals, and filling fiber. Recommendations include simple and healthy ideas for soups and desserts.   Overview     The Pritikin Solution Program Overview Clinical staff conducted group or individual video education with verbal and written material and guidebook.  Patient learns that the results of the Pritikin Program have been documented in more than 100 articles published in peer-reviewed journals, and the benefits include reducing risk factors for (  and, in some cases, even reversing) high cholesterol, high blood pressure, type 2 diabetes, obesity, and more! An overview of the three key pillars of the Pritikin Program will be covered: eating well, doing regular exercise, and having a healthy mind-set.  WORKSHOPS  Exercise: Exercise Basics: Building Your Action Plan Clinical staff led group instruction and group discussion with PowerPoint presentation and patient guidebook. To enhance the learning environment the use of posters, models and videos may be added. At the conclusion of this workshop, patients will comprehend the difference between physical activity and exercise, as well as the benefits of incorporating both, into their routine. Patients will understand the FITT (Frequency, Intensity, Time, and Type) principle and how to use it to build an exercise action plan. In addition, safety concerns and other considerations for exercise and cardiac rehab will be addressed by the presenter. The purpose of this lesson is to promote a comprehensive and effective weekly exercise routine in order to improve patients' overall level of fitness.   Managing Heart Disease: Your Path to a Healthier Heart Clinical staff led  group instruction and group discussion with PowerPoint presentation and patient guidebook. To enhance the learning environment the use of posters, models and videos may be added.At the conclusion of this workshop, patients will understand the anatomy and physiology of the heart. Additionally, they will understand how Pritikin's three pillars impact the risk factors, the progression, and the management of heart disease.  The purpose of this lesson is to provide a high-level overview of the heart, heart disease, and how the Pritikin lifestyle positively impacts risk factors.  Exercise Biomechanics Clinical staff led group instruction and group discussion with PowerPoint presentation and patient guidebook. To enhance the learning environment the use of posters, models and videos may be added. Patients will learn how the structural parts of their bodies function and how these functions impact their daily activities, movement, and exercise. Patients will learn how to promote a neutral spine, learn how to manage pain, and identify ways to improve their physical movement in order to promote healthy living. The purpose of this lesson is to expose patients to common physical limitations that impact physical activity. Participants will learn practical ways to adapt and manage aches and pains, and to minimize their effect on regular exercise. Patients will learn how to maintain good posture while sitting, walking, and lifting.  Balance Training and Fall Prevention  Clinical staff led group instruction and group discussion with PowerPoint presentation and patient guidebook. To enhance the learning environment the use of posters, models and videos may be added. At the conclusion of this workshop, patients will understand the importance of their sensorimotor skills (vision, proprioception, and the vestibular system) in maintaining their ability to balance as they age. Patients will apply a variety of balancing  exercises that are appropriate for their current level of function. Patients will understand the common causes for poor balance, possible solutions to these problems, and ways to modify their physical environment in order to minimize their fall risk. The purpose of this lesson is to teach patients about the importance of maintaining balance as they age and ways to minimize their risk of falling.  WORKSHOPS   Nutrition:  Fueling a Ship broker led group instruction and group discussion with PowerPoint presentation and patient guidebook. To enhance the learning environment the use of posters, models and videos may be added. Patients will review the foundational principles of the Pritikin Eating Plan and understand what constitutes a serving size  in each of the food groups. Patients will also learn Pritikin-friendly foods that are better choices when away from home and review make-ahead meal and snack options. Calorie density will be reviewed and applied to three nutrition priorities: weight maintenance, weight loss, and weight gain. The purpose of this lesson is to reinforce (in a group setting) the key concepts around what patients are recommended to eat and how to apply these guidelines when away from home by planning and selecting Pritikin-friendly options. Patients will understand how calorie density may be adjusted for different weight management goals.  Mindful Eating  Clinical staff led group instruction and group discussion with PowerPoint presentation and patient guidebook. To enhance the learning environment the use of posters, models and videos may be added. Patients will briefly review the concepts of the Pritikin Eating Plan and the importance of low-calorie dense foods. The concept of mindful eating will be introduced as well as the importance of paying attention to internal hunger signals. Triggers for non-hunger eating and techniques for dealing with triggers will be explored.  The purpose of this lesson is to provide patients with the opportunity to review the basic principles of the Pritikin Eating Plan, discuss the value of eating mindfully and how to measure internal cues of hunger and fullness using the Hunger Scale. Patients will also discuss reasons for non-hunger eating and learn strategies to use for controlling emotional eating.  Targeting Your Nutrition Priorities Clinical staff led group instruction and group discussion with PowerPoint presentation and patient guidebook. To enhance the learning environment the use of posters, models and videos may be added. Patients will learn how to determine their genetic susceptibility to disease by reviewing their family history. Patients will gain insight into the importance of diet as part of an overall healthy lifestyle in mitigating the impact of genetics and other environmental insults. The purpose of this lesson is to provide patients with the opportunity to assess their personal nutrition priorities by looking at their family history, their own health history and current risk factors. Patients will also be able to discuss ways of prioritizing and modifying the Pritikin Eating Plan for their highest risk areas  Menu  Clinical staff led group instruction and group discussion with PowerPoint presentation and patient guidebook. To enhance the learning environment the use of posters, models and videos may be added. Using menus brought in from [employer], or printed from [employer], patients will apply the Pritikin dining out guidelines that were presented in the [employer] video. Patients will also be able to practice these guidelines in a variety of provided scenarios. The purpose of this lesson is to provide patients with the opportunity to practice hands-on learning of the Pritikin Dining Out guidelines with actual menus and practice scenarios.  Label Reading Clinical staff led group instruction  and group discussion with PowerPoint presentation and patient guidebook. To enhance the learning environment the use of posters, models and videos may be added. Patients will review and discuss the Pritikin label reading guidelines presented in Pritikin's Label Reading Educational series video. Using fool labels brought in from local grocery stores and markets, patients will apply the label reading guidelines and determine if the packaged food meet the Pritikin guidelines. The purpose of this lesson is to provide patients with the opportunity to review, discuss, and practice hands-on learning of the Pritikin Label Reading guidelines with actual packaged food labels. Cooking School  Pritikin's [employer] are designed to teach patients ways to prepare quick,  simple, and affordable recipes at home. The importance of nutrition's role in chronic disease risk reduction is reflected in its emphasis in the overall Pritikin program. By learning how to prepare essential core Pritikin Eating Plan recipes, patients will increase control over what they eat; be able to customize the flavor of foods without the use of added salt, sugar, or fat; and improve the quality of the food they consume. By learning a set of core recipes which are easily assembled, quickly prepared, and affordable, patients are more likely to prepare more healthy foods at home. These workshops focus on convenient breakfasts, simple entres, side dishes, and desserts which can be prepared with minimal effort and are consistent with nutrition recommendations for cardiovascular risk reduction. Cooking [employer] are taught by a Armed forces logistics/support/administrative officer (RD) who has been trained by the [employer]. The chef or RD has a clear understanding of the importance of minimizing - if not completely eliminating - added fat, sugar, and sodium in recipes. Throughout the series of Cooking School Workshop sessions, patients will learn  about healthy ingredients and efficient methods of cooking to build confidence in their capability to prepare    Cooking School weekly topics:  Adding Flavor- Sodium-Free  Fast and Healthy Breakfasts  Powerhouse Plant-Based Proteins  Satisfying Salads and Dressings  Simple Sides and Sauces  International Cuisine-Spotlight on the [employer] Zones  Delicious Desserts  Savory Soups  [employer] - Meals in a Astronomer Appetizers and Snacks  Comforting Weekend Breakfasts  One-Pot Wonders   Fast Evening Meals  Landscape architect Your Pritikin Plate  WORKSHOPS   Healthy Mindset (Psychosocial):  Focused Goals, Sustainable Changes Clinical staff led group instruction and group discussion with PowerPoint presentation and patient guidebook. To enhance the learning environment the use of posters, models and videos may be added. Patients will be able to apply effective goal setting strategies to establish at least one personal goal, and then take consistent, meaningful action toward that goal. They will learn to identify common barriers to achieving personal goals and develop strategies to overcome them. Patients will also gain an understanding of how our mind-set can impact our ability to achieve goals and the importance of cultivating a positive and growth-oriented mind-set. The purpose of this lesson is to provide patients with a deeper understanding of how to set and achieve personal goals, as well as the tools and strategies needed to overcome common obstacles which may arise along the way.  From Head to Heart: The Power of a Healthy Outlook  Clinical staff led group instruction and group discussion with PowerPoint presentation and patient guidebook. To enhance the learning environment the use of posters, models and videos may be added. Patients will be able to recognize and describe the impact of emotions and mood on physical health. They will discover the importance of  self-care and explore self-care practices which may work for them. Patients will also learn how to utilize the 4 C's to cultivate a healthier outlook and better manage stress and challenges. The purpose of this lesson is to demonstrate to patients how a healthy outlook is an essential part of maintaining good health, especially as they continue their cardiac rehab journey.  Healthy Sleep for a Healthy Heart Clinical staff led group instruction and group discussion with PowerPoint presentation and patient guidebook. To enhance the learning environment the use of posters, models and videos may be added. At the conclusion of this workshop, patients will be able  to demonstrate knowledge of the importance of sleep to overall health, well-being, and quality of life. They will understand the symptoms of, and treatments for, common sleep disorders. Patients will also be able to identify daytime and nighttime behaviors which impact sleep, and they will be able to apply these tools to help manage sleep-related challenges. The purpose of this lesson is to provide patients with a general overview of sleep and outline the importance of quality sleep. Patients will learn about a few of the most common sleep disorders. Patients will also be introduced to the concept of "sleep hygiene," and discover ways to self-manage certain sleeping problems through simple daily behavior changes. Finally, the workshop will motivate patients by clarifying the links between quality sleep and their goals of heart-healthy living.   Recognizing and Reducing Stress Clinical staff led group instruction and group discussion with PowerPoint presentation and patient guidebook. To enhance the learning environment the use of posters, models and videos may be added. At the conclusion of this workshop, patients will be able to understand the types of stress reactions, differentiate between acute and chronic stress, and recognize the impact that chronic  stress has on their health. They will also be able to apply different coping mechanisms, such as reframing negative self-talk. Patients will have the opportunity to practice a variety of stress management techniques, such as deep abdominal breathing, progressive muscle relaxation, and/or guided imagery.  The purpose of this lesson is to educate patients on the role of stress in their lives and to provide healthy techniques for coping with it.  Learning Barriers/Preferences:  Learning Barriers/Preferences - 12/24/22 1351       Learning Barriers/Preferences   Learning Barriers Sight;Hearing    Learning Preferences Audio;Computer/Internet;Group Instruction;Individual Instruction;Pictoral;Skilled Demonstration;Verbal Instruction;Video;Written Material             Education Topics:  Knowledge Questionnaire Score:  Knowledge Questionnaire Score - 12/24/22 1352       Knowledge Questionnaire Score   Pre Score 21/24             Core Components/Risk Factors/Patient Goals at Admission:  Personal Goals and Risk Factors at Admission - 12/24/22 1353       Core Components/Risk Factors/Patient Goals on Admission   Improve shortness of breath with ADL's Yes    Intervention Provide education, individualized exercise plan and daily activity instruction to help decrease symptoms of SOB with activities of daily living.    Expected Outcomes Short Term: Improve cardiorespiratory fitness to achieve a reduction of symptoms when performing ADLs;Long Term: Be able to perform more ADLs without symptoms or delay the onset of symptoms    Lipids Yes    Intervention Provide education and support for participant on nutrition & aerobic/resistive exercise along with prescribed medications to achieve LDL 70mg , HDL >40mg .    Expected Outcomes Short Term: Participant states understanding of desired cholesterol values and is compliant with medications prescribed. Participant is following exercise prescription and  nutrition guidelines.;Long Term: Cholesterol controlled with medications as prescribed, with individualized exercise RX and with personalized nutrition plan. Value goals: LDL < 70mg , HDL > 40 mg.             Core Components/Risk Factors/Patient Goals Review:   Goals and Risk Factor Review     Row Name 12/30/22 1724 01/28/23 1339           Core Components/Risk Factors/Patient Goals Review   Personal Goals Review Weight Management/Obesity;Improve shortness of breath with ADL's;Lipids Weight Management/Obesity;Improve shortness of breath with  ADL's;Lipids      Review Christopher Arias started cardiac rehab on 12/30/22 and did well with exercise. Vital signs were stable. Oxygen saturations ranged from 90-96% on room air Christopher Arias is doing  well with exercise at cardiac rehab. . Vital signs have been stable. Christopher Arias rides his electric bike with a group of friends daily. Christopher Arias has increased his workloads and says cardiac rehab has been helpful for him.      Expected Outcomes Christopher Arias will continue to participate in cardiac rehab for exercise, nutrition and lifestyle modifications Christopher Arias will continue to participate in cardiac rehab for exercise, nutrition and lifestyle modifications               Core Components/Risk Factors/Patient Goals at Discharge (Final Review):   Goals and Risk Factor Review - 01/28/23 1339       Core Components/Risk Factors/Patient Goals Review   Personal Goals Review Weight Management/Obesity;Improve shortness of breath with ADL's;Lipids    Review Christopher Arias is doing  well with exercise at cardiac rehab. . Vital signs have been stable. Christopher Arias rides his electric bike with a group of friends daily. Christopher Arias has increased his workloads and says cardiac rehab has been helpful for him.    Expected Outcomes Christopher Arias will continue to participate in cardiac rehab for exercise, nutrition and lifestyle modifications             ITP Comments:  ITP Comments     Row Name 12/24/22 1025 12/30/22 1722 01/28/23  1135 01/28/23 1337     ITP Comments Christopher Magic, MD:  Medical Director.  Introduction to the Pritikin education program/Intensive cardiac rehab.  Initial oreintation packet reviewed with the patient. 30 day ITP Review. Jarid started cardiac rehab on 12/30/22 and did well with exercise. 30 day ITP Review. Zacaria started cardiac rehab on 12/30/22 and did well with exercise. 30 day ITP Review. Raun has good attendance and participation in cardiac rehab. Jahsai is doing well with exercise             Comments: See ITP comments.Thayer Headings RN BSN

## 2023-01-29 ENCOUNTER — Encounter (HOSPITAL_COMMUNITY)
Admission: RE | Admit: 2023-01-29 | Discharge: 2023-01-29 | Disposition: A | Discharge status: Home / Self Care | Payer: BC Managed Care – PPO | Source: Ambulatory Visit | Attending: Cardiology | Admitting: Cardiology

## 2023-01-29 ENCOUNTER — Other Ambulatory Visit (HOSPITAL_BASED_OUTPATIENT_CLINIC_OR_DEPARTMENT_OTHER): Payer: Self-pay

## 2023-01-29 DIAGNOSIS — Z952 Presence of prosthetic heart valve: Secondary | ICD-10-CM

## 2023-01-29 MED ORDER — BREZTRI AEROSPHERE 160-9-4.8 MCG/ACT IN AERO: 2.0000 | INHALATION_SPRAY | Freq: Two times a day (BID) | RESPIRATORY_TRACT | 5 refills | Status: DC

## 2023-01-30 ENCOUNTER — Encounter: Payer: Self-pay | Admitting: Internal Medicine

## 2023-01-30 ENCOUNTER — Telehealth: Payer: Self-pay | Admitting: Internal Medicine

## 2023-01-30 MED ORDER — TAMSULOSIN HCL 0.4 MG PO CAPS: 0.4000 mg | ORAL_CAPSULE | Freq: Every day | ORAL | 2 refills | Status: DC

## 2023-01-30 NOTE — Telephone Encounter (Signed)
Ok this is done 

## 2023-01-30 NOTE — Telephone Encounter (Signed)
Left message for patient to call back if he has any additional needs.

## 2023-01-30 NOTE — Telephone Encounter (Signed)
Pt wife called wanting to know if Dr. Jonny Ruiz can refill this medication tamsulosin (FLOMAX) 0.4 MG CAPS capsule [  due the hospital original prescribed this medication when he had heart surgery. Please advise.

## 2023-01-31 ENCOUNTER — Encounter (HOSPITAL_COMMUNITY): Payer: BC Managed Care – PPO

## 2023-01-31 NOTE — Telephone Encounter (Signed)
Called and left voice mail

## 2023-02-05 ENCOUNTER — Encounter (HOSPITAL_COMMUNITY)
Admission: RE | Admit: 2023-02-05 | Discharge: 2023-02-05 | Disposition: A | Discharge status: Home / Self Care | Payer: BC Managed Care – PPO | Source: Ambulatory Visit | Attending: Cardiology | Admitting: Cardiology

## 2023-02-05 DIAGNOSIS — Z952 Presence of prosthetic heart valve: Secondary | ICD-10-CM | POA: Diagnosis not present

## 2023-02-07 ENCOUNTER — Encounter (HOSPITAL_COMMUNITY)
Admission: RE | Admit: 2023-02-07 | Discharge: 2023-02-07 | Disposition: A | Discharge status: Home / Self Care | Payer: BC Managed Care – PPO | Source: Ambulatory Visit | Attending: Cardiology | Admitting: Cardiology

## 2023-02-07 DIAGNOSIS — Z952 Presence of prosthetic heart valve: Secondary | ICD-10-CM

## 2023-02-10 ENCOUNTER — Encounter (HOSPITAL_COMMUNITY)
Admission: RE | Admit: 2023-02-10 | Discharge: 2023-02-10 | Disposition: A | Discharge status: Home / Self Care | Payer: BC Managed Care – PPO | Source: Ambulatory Visit | Attending: Cardiology | Admitting: Cardiology

## 2023-02-10 ENCOUNTER — Telehealth: Payer: Self-pay | Admitting: Internal Medicine

## 2023-02-10 DIAGNOSIS — Z952 Presence of prosthetic heart valve: Secondary | ICD-10-CM | POA: Diagnosis not present

## 2023-02-10 NOTE — Telephone Encounter (Signed)
Prescription Request  02/10/2023  LOV: 11/20/2022  What is the name of the medication or equipment? clonopen  Have you contacted your pharmacy to request a refill? Yes   Which pharmacy would you like this sent to?  [id number] - [place], [place] - [address] AT [facility] [address] [place] [address] Phone: [phone] Fax: [phone]   Patient notified that their request is being sent to the clinical staff for review and that they should receive a response within 2 business days.   Please advise at Mobile [phone] (mobile)

## 2023-02-11 MED ORDER — CLONAZEPAM 1 MG PO TABS: 1.0000 mg | ORAL_TABLET | Freq: Two times a day (BID) | ORAL | 2 refills | Status: DC | PRN

## 2023-02-11 NOTE — Telephone Encounter (Signed)
Done erx 

## 2023-02-12 ENCOUNTER — Encounter (HOSPITAL_COMMUNITY)
Admission: RE | Admit: 2023-02-12 | Discharge: 2023-02-12 | Disposition: A | Discharge status: Home / Self Care | Payer: BC Managed Care – PPO | Source: Ambulatory Visit | Attending: Cardiology

## 2023-02-12 DIAGNOSIS — J449 Chronic obstructive pulmonary disease, unspecified: Secondary | ICD-10-CM | POA: Diagnosis not present

## 2023-02-12 DIAGNOSIS — Z952 Presence of prosthetic heart valve: Secondary | ICD-10-CM

## 2023-02-14 ENCOUNTER — Encounter (HOSPITAL_COMMUNITY)
Admission: RE | Admit: 2023-02-14 | Discharge: 2023-02-14 | Disposition: A | Discharge status: Home / Self Care | Payer: BC Managed Care – PPO | Source: Ambulatory Visit | Attending: Cardiology | Admitting: Cardiology

## 2023-02-14 ENCOUNTER — Ambulatory Visit (INDEPENDENT_AMBULATORY_CARE_PROVIDER_SITE_OTHER): Payer: Self-pay

## 2023-02-14 DIAGNOSIS — Z952 Presence of prosthetic heart valve: Secondary | ICD-10-CM

## 2023-02-14 DIAGNOSIS — I442 Atrioventricular block, complete: Secondary | ICD-10-CM

## 2023-02-14 LAB — CUP PACEART REMOTE DEVICE CHECK
Battery Remaining Longevity: 179 mo
Battery Voltage: 3.21 V
Brady Statistic AP VP Percent: 2.98 %
Brady Statistic AP VS Percent: 0.61 %
Brady Statistic AS VP Percent: 0.2 %
Brady Statistic AS VS Percent: 96.21 %
Brady Statistic RA Percent Paced: 3.76 %
Brady Statistic RV Percent Paced: 3.18 %
Date Time Interrogation Session: 20240913043042
Implantable Lead Connection Status: 753985
Implantable Lead Connection Status: 753985
Implantable Lead Implant Date: 20240611
Implantable Lead Implant Date: 20240611
Implantable Lead Location: 753859
Implantable Lead Location: 753860
Implantable Lead Model: 3830
Implantable Lead Model: 5076
Implantable Pulse Generator Implant Date: 20240611
Lead Channel Impedance Value: 323 Ohm
Lead Channel Impedance Value: 399 Ohm
Lead Channel Impedance Value: 532 Ohm
Lead Channel Impedance Value: 608 Ohm
Lead Channel Pacing Threshold Amplitude: 0.75 V
Lead Channel Pacing Threshold Amplitude: 0.875 V
Lead Channel Pacing Threshold Pulse Width: 0.4 ms
Lead Channel Pacing Threshold Pulse Width: 0.4 ms
Lead Channel Sensing Intrinsic Amplitude: 1 mV
Lead Channel Sensing Intrinsic Amplitude: 1 mV
Lead Channel Sensing Intrinsic Amplitude: 4.375 mV
Lead Channel Sensing Intrinsic Amplitude: 4.375 mV
Lead Channel Setting Pacing Amplitude: 1.5 V
Lead Channel Setting Pacing Amplitude: 2 V
Lead Channel Setting Pacing Pulse Width: 0.4 ms
Lead Channel Setting Sensing Sensitivity: 1.2 mV
Zone Setting Status: 755011

## 2023-02-17 ENCOUNTER — Ambulatory Visit: Payer: BC Managed Care – PPO | Attending: Cardiology | Admitting: Cardiology

## 2023-02-17 ENCOUNTER — Encounter: Payer: Self-pay | Admitting: Cardiology

## 2023-02-17 ENCOUNTER — Encounter (HOSPITAL_COMMUNITY): Payer: BC Managed Care – PPO

## 2023-02-17 VITALS — BP 120/68 | HR 75 | Ht 65.0 in | Wt 160.0 lb

## 2023-02-17 DIAGNOSIS — I442 Atrioventricular block, complete: Secondary | ICD-10-CM | POA: Diagnosis not present

## 2023-02-17 DIAGNOSIS — R000 Tachycardia, unspecified: Secondary | ICD-10-CM

## 2023-02-17 DIAGNOSIS — Z950 Presence of cardiac pacemaker: Secondary | ICD-10-CM

## 2023-02-17 DIAGNOSIS — I4719 Other supraventricular tachycardia: Secondary | ICD-10-CM

## 2023-02-17 NOTE — Patient Instructions (Signed)
Medication Instructions:  Your physician has recommended you make the following change in your medication:  STOP Digoxin  *If you need a refill on your cardiac medications before your next appointment, please call your pharmacy*   Lab Work: None ordered If you have labs (blood work) drawn today and your tests are completely normal, you will receive your results only by: MyChart Message (if you have MyChart) OR A paper copy in the mail If you have any lab test that is abnormal or we need to change your treatment, we will call you to review the results.   Testing/Procedures: None ordered   Follow-Up: At [facility], you and your health needs are our priority.  As part of our continuing mission to provide you with exceptional heart care, we have created designated Provider Care Teams.  These Care Teams include your primary Cardiologist (physician) and Advanced Practice Providers (APPs -  Physician Assistants and Nurse Practitioners) who all work together to provide you with the care you need, when you need it.  Your next appointment:   1 year(s)  The format for your next appointment:   In Person  Provider:   You may see Will Jorja Loa, MD or one of the following Advanced Practice Providers on your designated Care Team:   Francis Dowse, [place] Casimiro Needle "Golden" [place], PA-C Canary Brim, [place]   Thank you for choosing [facility]!!   Dory Horn, RN [phone]  Other Instructions

## 2023-02-17 NOTE — Progress Notes (Signed)
Electrophysiology Office Note:   Date:  02/17/2023  ID:  Christopher Arias, DOB 20-Oct-1960, MRN [id number]  Primary Cardiologist: Little Ishikawa, MD Electrophysiologist: Regan Lemming, MD      History of Present Illness:   Christopher Arias is a 62 y.o. male with h/o hyperlipidemia, hypothyroidism, COPD, OSA not on CPAP, severe AI post AVR seen today for routine electrophysiology followup.   Since last being seen in our clinic the patient reports doing.  He has much more energy and less fatigue and shortness of breath since his pacemaker was implanted and he had his AVR.  He is riding an e-bike 7 miles a day and going to cardiac rehab.  he denies chest pain, palpitations, dyspnea, PND, orthopnea, nausea, vomiting, dizziness, syncope, edema, weight gain, or early satiety.   Review of systems complete and found to be negative unless listed in HPI.      EP Information / Studies Reviewed:    EKG is ordered today. Personal review as below.  EKG Interpretation Date/Time:  Monday February 17 2023 12:21:47 EDT Ventricular Rate:  75 PR Interval:  220 QRS Duration:  70 QT Interval:  358 QTC Calculation: 399 R Axis:   266  Text Interpretation: Sinus rhythm with 1st degree A-V block Right superior axis deviation Pulmonary disease pattern Right ventricular hypertrophy T wave abnormality, consider anterior ischemia When compared with ECG of 27-Nov-2022 13:00, Premature ventricular complexes are no longer Confirmed by Lazaria Schaben ([id number]) on 02/17/2023 12:24:56 PM   PPM Interrogation-  reviewed in detail today,  See PACEART report.  Device History: Medtronic Dual Chamber PPM implanted 11/12/22 for CHB  Risk Assessment/Calculations:              Physical Exam:   VS:  BP 120/68 (BP Location: Left Arm, Patient Position: Sitting, Cuff Size: Normal)   Pulse 75   Ht 5\' 5"  (1.651 m)   Wt 160 lb (72.6 kg)   SpO2 97%   BMI 26.63 kg/m    Wt Readings from Last 3 Encounters:  02/17/23  160 lb (72.6 kg)  12/24/22 166 lb 7.2 oz (75.5 kg)  12/11/22 162 lb (73.5 kg)     GEN: Well nourished, well developed in no acute distress NECK: No JVD; No carotid bruits CARDIAC: Regular rate and rhythm, no murmurs, rubs, gallops RESPIRATORY:  Clear to auscultation without rales, wheezing or rhonchi  ABDOMEN: Soft, non-tender, non-distended EXTREMITIES:  No edema; No deformity   ASSESSMENT AND PLAN:    CHB s/p Medtronic PPM  Normal PPM function See Pace Art report No changes today  2.  Aortic insufficiency: Post AVR.  Plan per primary cardiology.  3.  Sinus/atrial tachycardia: Minimal noted on device interrogation  Disposition:   Follow up with EP APP in 12 months  Signed, Aveah Castell Jorja Loa, MD

## 2023-02-19 ENCOUNTER — Other Ambulatory Visit: Payer: Self-pay | Admitting: Cardiology

## 2023-02-19 ENCOUNTER — Encounter (HOSPITAL_COMMUNITY)
Admission: RE | Admit: 2023-02-19 | Discharge: 2023-02-19 | Disposition: A | Discharge status: Home / Self Care | Payer: BC Managed Care – PPO | Source: Ambulatory Visit | Attending: Cardiology | Admitting: Cardiology

## 2023-02-19 DIAGNOSIS — Z952 Presence of prosthetic heart valve: Secondary | ICD-10-CM | POA: Diagnosis not present

## 2023-02-19 NOTE — Progress Notes (Signed)
Remote pacemaker transmission.   

## 2023-02-21 ENCOUNTER — Encounter (HOSPITAL_COMMUNITY)
Admission: RE | Admit: 2023-02-21 | Discharge: 2023-02-21 | Disposition: A | Discharge status: Home / Self Care | Payer: BC Managed Care – PPO | Source: Ambulatory Visit | Attending: Cardiology | Admitting: Cardiology

## 2023-02-21 DIAGNOSIS — Z952 Presence of prosthetic heart valve: Secondary | ICD-10-CM

## 2023-02-24 ENCOUNTER — Encounter (HOSPITAL_COMMUNITY)
Admission: RE | Admit: 2023-02-24 | Discharge: 2023-02-24 | Disposition: A | Discharge status: Home / Self Care | Payer: BC Managed Care – PPO | Source: Ambulatory Visit | Attending: Cardiology | Admitting: Cardiology

## 2023-02-24 DIAGNOSIS — Z952 Presence of prosthetic heart valve: Secondary | ICD-10-CM | POA: Diagnosis not present

## 2023-02-26 ENCOUNTER — Encounter (HOSPITAL_COMMUNITY): Payer: BC Managed Care – PPO

## 2023-02-26 NOTE — Progress Notes (Signed)
Cardiac Individual Treatment Plan  Patient Details  Name: KYREESE ANSELL MRN: [id number] Date of Birth: 03/19/1961 Referring Provider:   Flowsheet Row INTENSIVE CARDIAC REHAB ORIENT from 12/24/2022 in [facility]  Referring Provider [name], MD       Initial Encounter Date:  Flowsheet Row INTENSIVE CARDIAC REHAB ORIENT from 12/24/2022 in [facility]  Date 12/24/22       Visit Diagnosis: 11/04/22 Status post aortic valve replacement  Patient's Home Medications on Admission:  Current Outpatient Medications:    albuterol (PROVENTIL) (2.5 MG/3ML) 0.083% nebulizer solution, Take 3 mLs (2.5 mg total) by nebulization every 6 (six) hours as needed for wheezing or shortness of breath. (Patient not taking: Reported on 02/17/2023), Disp: 75 mL, Rfl: 12   albuterol (VENTOLIN HFA) 108 (90 Base) MCG/ACT inhaler, Inhale 2 puffs into the lungs every 6 (six) hours as needed for wheezing or shortness of breath., Disp: 18 g, Rfl: 3   aspirin EC 81 MG tablet, Take 1 tablet (81 mg total) by mouth daily. Swallow whole., Disp: , Rfl:    atorvastatin (LIPITOR) 20 MG tablet, Take 1 tablet (20 mg total) by mouth daily., Disp: 90 tablet, Rfl: 3   Budeson-Glycopyrrol-Formoterol (BREZTRI AEROSPHERE) 160-9-4.8 MCG/ACT AERO, Inhale 2 puffs into the lungs in the morning and at bedtime., Disp: 10.7 g, Rfl: 5   clonazePAM (KLONOPIN) 1 MG tablet, Take 1 tablet (1 mg total) by mouth 2 (two) times daily as needed., Disp: 60 tablet, Rfl: 2   diphenhydramine-acetaminophen (TYLENOL PM) 25-500 MG TABS tablet, Take 2 tablets by mouth at bedtime., Disp: , Rfl:    Fe Fum-Vit C-Vit B12-FA (TRIGELS-F FORTE) CAPS capsule, Take 1 capsule by mouth daily after breakfast., Disp: , Rfl: 0   levothyroxine (SYNTHROID) 100 MCG tablet, Take 1 tablet (100 mcg total) by mouth daily., Disp: 90 tablet, Rfl: 3    metoprolol succinate (TOPROL-XL) 50 MG 24 hr tablet, Take 1 tablet (50 mg total) by mouth daily. Take with or immediately following a meal., Disp: 90 tablet, Rfl: 3   ondansetron (ZOFRAN-ODT) 4 MG disintegrating tablet, Take 1 tablet (4 mg total) by mouth every 8 (eight) hours as needed for nausea or vomiting., Disp: 30 tablet, Rfl: 1   oxyCODONE (OXY IR/ROXICODONE) 5 MG immediate release tablet, Take 1 tablet (5 mg total) by mouth every 6 (six) hours as needed for severe pain., Disp: 28 tablet, Rfl: 0   OXYGEN, Inhale 2-3 L into the lungs at bedtime. As needed during the day, Disp: , Rfl:    sertraline (ZOLOFT) 100 MG tablet, Take 2 tablets (200 mg total) by mouth daily., Disp: 180 tablet, Rfl: 3   tamsulosin (FLOMAX) 0.4 MG CAPS capsule, Take 1 capsule (0.4 mg total) by mouth daily., Disp: 90 capsule, Rfl: 2   varenicline (CHANTIX) 1 MG tablet, TAKE 1 TABLET BY MOUTH TWICE A DAY, Disp: 180 tablet, Rfl: 2  Past Medical History: Past Medical History:  Diagnosis Date   ANTERIOR PITUITARY HYPERFUNCTION 11/23/2009   ANXIETY 11/24/2007   COPD (chronic obstructive pulmonary disease) (HCC) 05/31/2021   GERD 11/24/2007   GYNECOMASTIA 11/10/2009   HYPERLIPIDEMIA 11/24/2007   HYPOTHYROIDISM, POST-RADIATION 03/16/2010   PONV (postoperative nausea and vomiting)    Severe aortic insufficiency     Tobacco Use: Social History   Tobacco Use  Smoking Status Former   Current packs/day: 0.00   Average packs/day: 1 pack/day  for 20.0 years (20.0 ttl pk-yrs)   Types: Cigarettes   Start date: 08/24/2001   Quit date: 08/24/2021   Years since quitting: 1.5  Smokeless Tobacco Never    Labs: Review Flowsheet  More data exists      Latest Ref Rng & Units 05/30/2021 05/21/2022 08/20/2022 10/31/2022 11/04/2022  Labs for ITP Cardiac and Pulmonary Rehab  Cholestrol 0 - 200 mg/dL 956  387  - - -  LDL (calc) 0 - 99 mg/dL 564  77  - - -  HDL-C >33.29 mg/dL 51.88  41.66  - - -  Trlycerides 0.0 - 149.0 mg/dL 06.3   01.6  - - -  Hemoglobin A1c 4.8 - 5.6 % 5.7  5.9  - 5.7  -  PH, Arterial 7.35 - 7.45 - - 7.359  7.41  7.324  7.321  7.296  7.318  7.365  7.257  7.373  7.359   PCO2 arterial 32 - 48 mmHg - - 35.6  37  42.7  43.2  46.0  44.2  39.2  45.1  36.1  38.7   Bicarbonate 20.0 - 28.0 mmol/L - - 21.2  21.7  20.1  23.5  22.1  22.4  22.7  22.7  22.6  20.4  21.0  23.0  21.8   TCO2 22 - 32 mmol/L - - 22  23  21   - 23  24  24  24  24  24  22  24  24  22  24  24  23  24  25    Acid-base deficit 0.0 - 2.0 mmol/L - - 4.0  4.0  5.0  0.8  4.0  4.0  4.0  3.0  3.0  7.0  4.0  3.0  3.0   O2 Saturation % - - 72  70  97  98.1  97  94  99  100  100  98  100  78  100     Details       Multiple values from one day are sorted in reverse-chronological order         Capillary Blood Glucose: Lab Results  Component Value Date   GLUCAP 116 (H) 11/06/2022   GLUCAP 115 (H) 11/06/2022   GLUCAP 118 (H) 11/06/2022   GLUCAP 128 (H) 11/06/2022   GLUCAP 110 (H) 11/06/2022     Exercise Target Goals: Exercise Program Goal: Individual exercise prescription set using results from initial 6 min walk test and THRR while considering  patient's activity barriers and safety.   Exercise Prescription Goal: Initial exercise prescription builds to 30-45 minutes a day of aerobic activity, 2-3 days per week.  Home exercise guidelines will be given to patient during program as part of exercise prescription that the participant will acknowledge.  Activity Barriers & Risk Stratification:  Activity Barriers & Cardiac Risk Stratification - 12/24/22 1346       Activity Barriers & Cardiac Risk Stratification   Activity Barriers Arthritis;Joint Problems;Shortness of Breath;Balance Concerns;Other (comment)    Comments Sternal precautions/ S/P PPM    Cardiac Risk Stratification High             6 Minute Walk:  6 Minute Walk     Row Name 12/24/22 1133         6 Minute Walk   Phase Initial     Distance 1291 feet     Walk Time 6  minutes     # of Rest Breaks 0     MPH 2.45  METS 3.4     RPE 11     Perceived Dyspnea  1     VO2 Peak 11.81     Symptoms Yes (comment)     Comments SOB, RPD = 1     Resting HR 86 bpm     Resting BP 106/70     Resting Oxygen Saturation  95 %     Exercise Oxygen Saturation  during 6 min walk 90 %     Max Ex. HR 105 bpm     Max Ex. BP 130/64     2 Minute Post BP 108/70       Interval HR   1 Minute HR 87     2 Minute HR 102     3 Minute HR 101     4 Minute HR 100     5 Minute HR 103     6 Minute HR 103     2 Minute Post HR 87     Interval Heart Rate? Yes       Interval Oxygen   Interval Oxygen? Yes     Baseline Oxygen Saturation % 95 %     1 Minute Oxygen Saturation % 92 %     1 Minute Liters of Oxygen 0 L     2 Minute Oxygen Saturation % 91 %     2 Minute Liters of Oxygen 0 L     3 Minute Oxygen Saturation % 90 %     3 Minute Liters of Oxygen 0 L     4 Minute Oxygen Saturation % 90 %     4 Minute Liters of Oxygen 0 L     5 Minute Oxygen Saturation % 90 %     5 Minute Liters of Oxygen 0 L     6 Minute Oxygen Saturation % 90 %     6 Minute Liters of Oxygen 0 L     2 Minute Post Oxygen Saturation % 94 %     2 Minute Post Liters of Oxygen 0 L              Oxygen Initial Assessment:   Oxygen Re-Evaluation:   Oxygen Discharge (Final Oxygen Re-Evaluation):   Initial Exercise Prescription:  Initial Exercise Prescription - 12/24/22 1300       Date of Initial Exercise RX and Referring Provider   Date 12/24/22    Referring Provider [name], MD    Expected Discharge Date 03/19/23      Bike   Level 1    Watts 33    Minutes 15    METs 3.4      NuStep   Level 1    SPM 75    Minutes 15    METs 3.4      Prescription Details   Frequency (times per week) 3    Duration Progress to 30 minutes of continuous aerobic without signs/symptoms of physical distress      Intensity   THRR 40-80% of Max Heartrate 64-127    Ratings of Perceived  Exertion 11-13    Perceived Dyspnea 0-4      Progression   Progression Continue progressive overload as per policy without signs/symptoms or physical distress.      Resistance Training   Training Prescription Yes    Weight 3 lbs    Reps 10-15             Perform Capillary Blood Glucose checks as needed.  Exercise Prescription  Changes:   Exercise Prescription Changes     Row Name 12/30/22 1701 01/17/23 1659 02/05/23 1633         Response to Exercise   Blood Pressure (Admit) 117/78 110/66 118/72     Blood Pressure (Exercise) 120/80 114/72 126/72     Blood Pressure (Exit) 106/70 117/81 110/68     Heart Rate (Admit) 83 bpm 74 bpm 73 bpm     Heart Rate (Exercise) 114 bpm 106 bpm 103 bpm     Heart Rate (Exit) 89 bpm 80 bpm 79 bpm     Oxygen Saturation (Admit) 96 % -- --     Oxygen Saturation (Exercise) 90 % -- --     Oxygen Saturation (Exit) 96 % -- --     Rating of Perceived Exertion (Exercise) 9 11 11      Perceived Dyspnea (Exercise) 0 0 0     Symptoms Some mild SOB, resolved with rest, COPD on RA -- --     Comments Pt first day in the [facility] program Reviewed MET's, goals and home ExRx Reviewed MET's     Duration Progress to 30 minutes of  aerobic without signs/symptoms of physical distress Progress to 30 minutes of  aerobic without signs/symptoms of physical distress Progress to 30 minutes of  aerobic without signs/symptoms of physical distress     Intensity THRR unchanged THRR unchanged THRR unchanged       Progression   Progression Continue to progress workloads to maintain intensity without signs/symptoms of physical distress. Continue to progress workloads to maintain intensity without signs/symptoms of physical distress. Continue to progress workloads to maintain intensity without signs/symptoms of physical distress.     Average METs 2.15 4.15 3.2       Resistance Training   Training Prescription Yes Yes No     Weight 3 lbs 4 lbs wts 4 lbs wts     Reps 10-15  10-15 10-15     Time 10 Minutes 10 Minutes 10 Minutes       Bike   Level 1 3.5 --     Watts 17 -- --     Minutes 15 15 --     METs 2.4 5.1 --       NuStep   Level 1 3 3      SPM 80 94 83     Minutes 15 15 15      METs 1.9 3.2 3.2       Home Exercise Plan   Plans to continue exercise at -- Home (comment) Home (comment)     Frequency -- Add 4 additional days to program exercise sessions. Add 4 additional days to program exercise sessions.     Initial Home Exercises Provided -- 01/17/23 01/17/23              Exercise Comments:   Exercise Comments     Row Name 12/30/22 1707 01/17/23 1708 02/05/23 1635 02/26/23 1622     Exercise Comments Pt first day in the [facility] program. Pt tolerated exercise well with an average MET level of 2.15. Pt is learning his THRR, RPE and ExRx. Off to a great start Reviewed MET's, goals and home ExRx. Pt tolerated exercise well with an average MET level of 4.15. Pt feels good about his goals, he's back to riding his bike with friends, he has better control of his SOB and is increasing his strength. Pt will add in walking and bike riding 4 days for 30-45 mins per session.  Reviewed MET's. Pt tolerated exercise well with an average MET level of 4.15. Pt is doing well overall Patient absent today. Will review education on his return             Exercise Goals and Review:   Exercise Goals     Row Name 12/24/22 1041             Exercise Goals   Increase Physical Activity Yes       Intervention Provide advice, education, support and counseling about physical activity/exercise needs.;Develop an individualized exercise prescription for aerobic and resistive training based on initial evaluation findings, risk stratification, comorbidities and participant's personal goals.       Expected Outcomes Short Term: Attend rehab on a regular basis to increase amount of physical activity.;Long Term: Exercising regularly at least 3-5 days a week.;Long Term:  Add in home exercise to make exercise part of routine and to increase amount of physical activity.       Increase Strength and Stamina Yes       Intervention Provide advice, education, support and counseling about physical activity/exercise needs.;Develop an individualized exercise prescription for aerobic and resistive training based on initial evaluation findings, risk stratification, comorbidities and participant's personal goals.       Expected Outcomes Short Term: Increase workloads from initial exercise prescription for resistance, speed, and METs.;Short Term: Perform resistance training exercises routinely during rehab and add in resistance training at home;Long Term: Improve cardiorespiratory fitness, muscular endurance and strength as measured by increased METs and functional capacity ( )       Able to understand and use rate of perceived exertion (RPE) scale Yes       Intervention Provide education and explanation on how to use RPE scale       Expected Outcomes Short Term: Able to use RPE daily in rehab to express subjective intensity level;Long Term:  Able to use RPE to guide intensity level when exercising independently       Knowledge and understanding of Target Heart Rate Range (THRR) Yes       Intervention Provide education and explanation of THRR including how the numbers were predicted and where they are located for reference       Expected Outcomes Short Term: Able to state/look up THRR;Long Term: Able to use THRR to govern intensity when exercising independently;Short Term: Able to use daily as guideline for intensity in rehab       Understanding of Exercise Prescription Yes       Intervention Provide education, explanation, and written materials on patient's individual exercise prescription       Expected Outcomes Short Term: Able to explain program exercise prescription;Long Term: Able to explain home exercise prescription to exercise independently                Exercise  Goals Re-Evaluation :  Exercise Goals Re-Evaluation     Row Name 12/30/22 1705 01/17/23 1704           Exercise Goal Re-Evaluation   Exercise Goals Review Increase Physical Activity;Understanding of Exercise Prescription;Increase Strength and Stamina;Knowledge and understanding of Target Heart Rate Range (THRR);Able to understand and use rate of perceived exertion (RPE) scale Increase Physical Activity;Understanding of Exercise Prescription;Increase Strength and Stamina;Knowledge and understanding of Target Heart Rate Range (THRR);Able to understand and use rate of perceived exertion (RPE) scale      Comments Pt first day in the [facility] program. Pt tolerated exercise well with an average MET level of 2.15.  Pt is learning his THRR, RPE and ExRx. Off to a great start Reviewed MET's, goals and home ExRx. Pt tolerated exercise well with an average MET level of 4.15. Pt feels good about his goals, he's back to riding his bike with friends, he has better control of his SOB and is increasing his strength. Pt will add in walking and bike riding 4 days for 30-45 mins per session.      Expected Outcomes Will continue to monitor and progress workloads as tolerated without sign or symptom Will continue to monitor and progress workloads as tolerated without sign or symptom               Discharge Exercise Prescription (Final Exercise Prescription Changes):  Exercise Prescription Changes - 02/05/23 1633       Response to Exercise   Blood Pressure (Admit) 118/72    Blood Pressure (Exercise) 126/72    Blood Pressure (Exit) 110/68    Heart Rate (Admit) 73 bpm    Heart Rate (Exercise) 103 bpm    Heart Rate (Exit) 79 bpm    Rating of Perceived Exertion (Exercise) 11    Perceived Dyspnea (Exercise) 0    Comments Reviewed MET's    Duration Progress to 30 minutes of  aerobic without signs/symptoms of physical distress    Intensity THRR unchanged      Progression   Progression Continue to progress  workloads to maintain intensity without signs/symptoms of physical distress.    Average METs 3.2      Resistance Training   Training Prescription No    Weight 4 lbs wts    Reps 10-15    Time 10 Minutes      NuStep   Level 3    SPM 83    Minutes 15    METs 3.2      Home Exercise Plan   Plans to continue exercise at Home (comment)    Frequency Add 4 additional days to program exercise sessions.    Initial Home Exercises Provided 01/17/23             Nutrition:  Target Goals: Understanding of nutrition guidelines, daily intake of sodium 1500mg , cholesterol 200mg , calories 30% from fat and 7% or less from saturated fats, daily to have 5 or more servings of fruits and vegetables.  Biometrics:  Pre Biometrics - 12/24/22 1045       Pre Biometrics   Waist Circumference 38 inches    Hip Circumference 38.5 inches    Waist to Hip Ratio 0.99 %    Triceps Skinfold 14 mm    % Body Fat 26.4 %    Grip Strength 23 kg    Flexibility 10 in    Single Leg Stand 3.2 seconds              Nutrition Therapy Plan and Nutrition Goals:  Nutrition Therapy & Goals - 01/29/23 1615       Nutrition Therapy   Diet Heart Healthy Diet    Drug/Food Interactions Statins/Certain Fruits      Personal Nutrition Goals   Nutrition Goal Patient to identify strategies for reducing cardiovascular risk by attending the [facility] education and nutrition series weekly.   goal in progress.   Personal Goal #2 Patient to improve diet quality by using the plate method as a guide for meal planning to include lean protein/plant protein, fruits, vegetables, whole grains, nonfat dairy as part of a well-balanced diet.   goal in progress.   Personal  Goal #3 Patient to reduce sodium to 1500mg  per day   goal in progress.   Comments Larrie continues to attend the [employer] and nutrition series regularly. He enjoys cooking and has implemented [employer] He has improved understanding of saturated  fat, sodium, and fiber recommendations. He is up 1.8# since starting with our program.  Patient will benefit from participation in intensive cardiac rehab for nutrition, exercise, and lifestyle modification.      Intervention Plan   Intervention Prescribe, educate and counsel regarding individualized specific dietary modifications aiming towards targeted core components such as weight, hypertension, lipid management, diabetes, heart failure and other comorbidities.;Nutrition handout(s) given to patient.    Expected Outcomes Short Term Goal: Understand basic principles of dietary content, such as calories, fat, sodium, cholesterol and nutrients.;Long Term Goal: Adherence to prescribed nutrition plan.             Nutrition Assessments:  Nutrition Assessments - 01/02/23 0944       Rate Your Plate Scores   Pre Score 54            MEDIFICTS Score Key: >=70 Need to make dietary changes  40-70 Heart Healthy Diet <= 40 Therapeutic Level Cholesterol Diet   Flowsheet Row INTENSIVE CARDIAC REHAB from 01/01/2023 in [facility]  Picture Your Plate Total Score on Admission 54      Picture Your Plate Scores: <95 Unhealthy dietary pattern with much room for improvement. 41-50 Dietary pattern unlikely to meet recommendations for good health and room for improvement. 51-60 More healthful dietary pattern, with some room for improvement.  >60 Healthy dietary pattern, although there may be some specific behaviors that could be improved.    Nutrition Goals Re-Evaluation:  Nutrition Goals Re-Evaluation     Row Name 01/01/23 1558 01/29/23 1615           Goals   Current Weight 164 lb 10.9 oz (74.7 kg) 167 lb 8.8 oz (76 kg)      Comment lipids WNL, A1c 5.7 no new labs; most recent labs  lipids WNL, A1c 5.7      Expected Outcome Domani reports eating a wide variety of foods. He enjoys cooking and is eager to learn more about heart  healthy eating. Patient will benefit from participation in intensive cardiac rehab for nutrition, exercise, and lifestyle modification. Octavius continues to attend the [employer] and nutrition series regularly. He enjoys cooking and has implemented [employer] He has improved understanding of saturated fat, sodium, and fiber recommendations. He is up 1.8# since starting with our program. Patient will benefit from participation in intensive cardiac rehab for nutrition, exercise, and lifestyle modification.               Nutrition Goals Re-Evaluation:  Nutrition Goals Re-Evaluation     Row Name 01/01/23 1558 01/29/23 1615           Goals   Current Weight 164 lb 10.9 oz (74.7 kg) 167 lb 8.8 oz (76 kg)      Comment lipids WNL, A1c 5.7 no new labs; most recent labs  lipids WNL, A1c 5.7      Expected Outcome Keelin reports eating a wide variety of foods. He enjoys cooking and is eager to learn more about heart healthy eating. Patient will benefit from participation in intensive cardiac rehab for nutrition, exercise, and lifestyle modification. Kunio continues to attend the [employer] and nutrition series regularly. He enjoys  cooking and has implemented [employer] He has improved understanding of saturated fat, sodium, and fiber recommendations. He is up 1.8# since starting with our program. Patient will benefit from participation in intensive cardiac rehab for nutrition, exercise, and lifestyle modification.               Nutrition Goals Discharge (Final Nutrition Goals Re-Evaluation):  Nutrition Goals Re-Evaluation - 01/29/23 1615       Goals   Current Weight 167 lb 8.8 oz (76 kg)    Comment no new labs; most recent labs  lipids WNL, A1c 5.7    Expected Outcome Price continues to attend the [employer] education and nutrition series regularly. He enjoys cooking and has implemented [employer] He has improved understanding of saturated fat, sodium,  and fiber recommendations. He is up 1.8# since starting with our program. Patient will benefit from participation in intensive cardiac rehab for nutrition, exercise, and lifestyle modification.             Psychosocial: Target Goals: Acknowledge presence or absence of significant depression and/or stress, maximize coping skills, provide positive support system. Participant is able to verbalize types and ability to use techniques and skills needed for reducing stress and depression.  Initial Review & Psychosocial Screening:  Initial Psych Review & Screening - 12/24/22 1350       Initial Review   Current issues with Current Sleep Concerns   Pt voices his sleep issues have been going on for many years     Family Dynamics   Good Support System? Yes   Pt has spouse and friends     Barriers   Psychosocial barriers to participate in program There are no identifiable barriers or psychosocial needs.      Screening Interventions   Interventions Encouraged to exercise             Quality of Life Scores:  Quality of Life - 12/24/22 1351       Quality of Life   Select Quality of Life      Quality of Life Scores   Health/Function Pre 23.73 %    Socioeconomic Pre 30 %    Psych/Spiritual Pre 25.57 %    Family Pre 26.25 %    GLOBAL Pre 25.76 %            Scores of 19 and below usually indicate a poorer quality of life in these areas.  A difference of  2-3 points is a clinically meaningful difference.  A difference of 2-3 points in the total score of the Quality of Life Index has been associated with significant improvement in overall quality of life, self-image, physical symptoms, and general health in studies assessing change in quality of life.  PHQ-9: Review Flowsheet  More data exists      12/24/2022 11/20/2022 05/14/2022 11/27/2020 03/08/2019  Depression screen PHQ 2/9  Decreased Interest 0 0 0 0 0  Down, Depressed, Hopeless 0 0 0 0 0  PHQ - 2 Score 0 0 0 0 0  Altered  sleeping 2 - - - -  Tired, decreased energy 0 - - - -  Change in appetite 0 - - - -  Feeling bad or failure about yourself  0 - - - -  Trouble concentrating 0 - - - -  Moving slowly or fidgety/restless 0 - - - -  Suicidal thoughts 0 - - - -  PHQ-9 Score 2 - - - -  Difficult doing work/chores Somewhat  difficult - - - -    Details           Interpretation of Total Score  Total Score Depression Severity:  1-4 = Minimal depression, 5-9 = Mild depression, 10-14 = Moderate depression, 15-19 = Moderately severe depression, 20-27 = Severe depression   Psychosocial Evaluation and Intervention:   Psychosocial Re-Evaluation:  Psychosocial Re-Evaluation     Row Name 12/30/22 1722 01/28/23 1338 02/26/23 1432         Psychosocial Re-Evaluation   Current issues with None Identified None Identified None Identified     Comments Ulys did not voice any concerns or stressors on his first day of exercise. -- --     Interventions Stress management education;Encouraged to attend Cardiac Rehabilitation for the exercise;Relaxation education Stress management education;Encouraged to attend Cardiac Rehabilitation for the exercise;Relaxation education Stress management education;Encouraged to attend Cardiac Rehabilitation for the exercise;Relaxation education     Continue Psychosocial Services  No Follow up required No Follow up required No Follow up required              Psychosocial Discharge (Final Psychosocial Re-Evaluation):  Psychosocial Re-Evaluation - 02/26/23 1432       Psychosocial Re-Evaluation   Current issues with None Identified    Interventions Stress management education;Encouraged to attend Cardiac Rehabilitation for the exercise;Relaxation education    Continue Psychosocial Services  No Follow up required             Vocational Rehabilitation: Provide vocational rehab assistance to qualifying candidates.   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 Participant will state understanding/return demonstration of topics presented.    Education     Row Name 12/30/22 1600     Education   Cardiac Education Topics Pritikin   Geographical information systems officer Psychosocial   Psychosocial Workshop Healthy Sleep for a Healthy Heart   Instruction Review Code 1- Verbalizes Understanding   Class Start Time 1400   Class Stop Time 1455   Class Time Calculation (min) 55 min    Row Name 01/03/23 1500     Education   Cardiac Education Topics Pritikin   Psychologist, forensic Exercise Education   Exercise Education Improving Performance   Instruction Review Code 1- Verbalizes Understanding   Class Start Time 1400   Class Stop Time 1455   Class Time Calculation (min) 55 min    Row Name 01/06/23 1500     Education   Cardiac Education Topics Pritikin   Glass blower/designer Nutrition   Nutrition Workshop Fueling a Forensic psychologist   Instruction Review Code 1- Tax inspector   Class Start Time 1400   Class Stop Time 1452   Class Time Calculation (min) 52 min    Row Name 01/08/23 1500     Education   Cardiac Education Topics Pritikin   Customer service manager  Weekly Topic Simple Sides and Sauces   Instruction Review Code 1- Verbalizes Understanding   Class Start Time 1400   Class Stop Time 1445   Class Time Calculation (min) 45 min    Row Name 01/10/23 1400     Education   Cardiac Education Topics Pritikin   Select Core  Videos     Core Videos   Educator Exercise Physiologist   Select Psychosocial   Psychosocial How Our Thoughts Can Heal Our Hearts   Instruction Review Code 1- Verbalizes Understanding   Class Start Time 1400   Class Stop Time 1450   Class Time Calculation (min) 50 min    Row Name 01/13/23 1500     Workshops   Educator Exercise Physiologist   Select Exercise   Exercise Workshop Managing Heart Disease: Your Path to a Healthier Heart   Instruction Review Code 1- Verbalizes Understanding   Class Start Time 1415   Class Stop Time 1510   Class Time Calculation (min) 55 min    Row Name 01/15/23 1500     Education   Cardiac Education Topics Pritikin   Customer service manager   Weekly Topic Powerhouse Plant-Based Proteins   Instruction Review Code 1- Verbalizes Understanding   Class Start Time 1400   Class Stop Time 1440   Class Time Calculation (min) 40 min    Row Name 01/17/23 1400     Education   Cardiac Education Topics Pritikin   Hospital doctor Education   General Education Hypertension and Heart Disease   Instruction Review Code 1- Verbalizes Understanding   Class Start Time 1410   Class Stop Time 1445   Class Time Calculation (min) 35 min    Row Name 01/20/23 1500     Education   Cardiac Education Topics Pritikin   Geographical information systems officer Psychosocial   Psychosocial Workshop From Head to Heart: The Power of a Healthy Outlook   Instruction Review Code 1- Verbalizes Understanding   Class Start Time 1400   Class Stop Time 1446   Class Time Calculation (min) 46 min    Row Name 01/27/23 1500     Education   Cardiac Education Topics Pritikin   Licensed conveyancer Nutrition   Nutrition Overview of the Pritikin Eating Plan   Instruction Review Code 1-  Verbalizes Understanding   Class Start Time 1400   Class Stop Time 1452   Class Time Calculation (min) 52 min    Row Name 01/29/23 1500     Education   Cardiac Education Topics Pritikin   Customer service manager   Weekly Topic Fast and Healthy Breakfasts   Instruction Review Code 1- Verbalizes Understanding   Class Start Time 1400   Class Stop Time 1445   Class Time Calculation (min) 45 min    Row Name 02/05/23 1500     Education   Cardiac Education Topics Pritikin   Customer service manager   Weekly Topic Personalizing Your Pritikin Plate   Instruction Review Code 1- Verbalizes Understanding   Class Start Time 1400   Class Stop Time 1440  Class Time Calculation (min) 40 min    Row Name 02/07/23 1500     Education   Cardiac Education Topics Pritikin   Select Workshops     Workshops   Educator Exercise Physiologist   Select Exercise   Exercise Workshop Location manager and Fall Prevention   Instruction Review Code 1- Verbalizes Understanding   Class Start Time 1405   Class Stop Time 1455   Class Time Calculation (min) 50 min    Row Name 02/10/23 1600     Education   Cardiac Education Topics Pritikin   Glass blower/designer Nutrition   Nutrition Workshop Label Reading   Instruction Review Code 1- Tax inspector   Class Start Time 1400   Class Stop Time 1445   Class Time Calculation (min) 45 min    Row Name 02/12/23 1600     Education   Cardiac Education Topics Pritikin   Customer service manager   Weekly Topic [employer] Desserts   Instruction Review Code 1- Verbalizes Understanding   Class Start Time 1400   Class Stop Time 1442   Class Time Calculation (min) 42 min    Row Name 02/14/23 1500     Education   Cardiac Education Topics Pritikin   Geologist, engineering Nutrition   Nutrition Other  label reading   Instruction Review Code 1- Verbalizes Understanding   Class Start Time 1400   Class Stop Time 1500   Class Time Calculation (min) 60 min    Row Name 02/19/23 1500     Education   Cardiac Education Topics Pritikin   Orthoptist   Educator Dietitian   Weekly Topic Tasty Appetizers and Snacks   Instruction Review Code 1- Verbalizes Understanding   Class Start Time 1355   Class Stop Time 1433   Class Time Calculation (min) 38 min    Row Name 02/21/23 1400     Education   Cardiac Education Topics Pritikin   Nurse, children's Exercise Physiologist   Select Nutrition   Nutrition Calorie Density   Instruction Review Code 1- Verbalizes Understanding   Class Start Time 1358   Class Stop Time 1432   Class Time Calculation (min) 34 min    Row Name 02/24/23 1500     Education   Cardiac Education Topics Pritikin   Select Workshops     Workshops   Educator Exercise Physiologist   Select Exercise   Exercise Workshop Exercise Basics: Building Your Action Plan   Instruction Review Code 1- Verbalizes Understanding   Class Start Time 1358   Class Stop Time 1449   Class Time Calculation (min) 51 min            Core Videos: Exercise    Move It!  Clinical staff conducted group or individual video education with verbal and written material and guidebook.  Patient learns the recommended Pritikin exercise program. Exercise with the goal of living a long, healthy life. Some of the health benefits of exercise include controlled diabetes, healthier blood pressure levels, improved cholesterol levels, improved heart and lung capacity, improved sleep, and better body composition. Everyone should speak with their doctor before starting or changing an exercise routine.  Biomechanical Limitations Clinical  staff conducted group or individual video education  with verbal and written material and guidebook.  Patient learns how biomechanical limitations can impact exercise and how we can mitigate and possibly overcome limitations to have an impactful and balanced exercise routine.  Body Composition Clinical staff conducted group or individual video education with verbal and written material and guidebook.  Patient learns that body composition (ratio of muscle mass to fat mass) is a key component to assessing overall fitness, rather than body weight alone. Increased fat mass, especially visceral belly fat, can put [place] at increased risk for metabolic syndrome, type 2 diabetes, heart disease, and even death. It is recommended to combine diet and exercise (cardiovascular and resistance training) to improve your body composition. Seek guidance from your physician and exercise physiologist before implementing an exercise routine.  Exercise Action Plan Clinical staff conducted group or individual video education with verbal and written material and guidebook.  Patient learns the recommended strategies to achieve and enjoy long-term exercise adherence, including variety, self-motivation, self-efficacy, and positive decision making. Benefits of exercise include fitness, good health, weight management, more energy, better sleep, less stress, and overall well-being.  Medical   Heart Disease Risk Reduction Clinical staff conducted group or individual video education with verbal and written material and guidebook.  Patient learns our heart is our most vital organ as it circulates oxygen, nutrients, white blood cells, and hormones throughout the entire body, and carries waste away. Data supports a plant-based eating plan like the Pritikin Program for its effectiveness in slowing progression of and reversing heart disease. The video provides a number of recommendations to address heart disease.   Metabolic Syndrome and Belly Fat  Clinical staff conducted group or  individual video education with verbal and written material and guidebook.  Patient learns what metabolic syndrome is, how it leads to heart disease, and how one can reverse it and keep it from coming back. You have metabolic syndrome if you have 3 of the following 5 criteria: abdominal obesity, high blood pressure, high triglycerides, low HDL cholesterol, and high blood sugar.  Hypertension and Heart Disease Clinical staff conducted group or individual video education with verbal and written material and guidebook.  Patient learns that high blood pressure, or hypertension, is very common in the [place]. Hypertension is largely due to excessive salt intake, but other important risk factors include being overweight, physical inactivity, drinking too much alcohol, smoking, and not eating enough potassium from fruits and vegetables. High blood pressure is a leading risk factor for heart attack, stroke, congestive heart failure, dementia, kidney failure, and premature death. Long-term effects of excessive salt intake include stiffening of the arteries and thickening of heart muscle and organ damage. Recommendations include ways to reduce hypertension and the risk of heart disease.  Diseases of Our Time - Focusing on Diabetes Clinical staff conducted group or individual video education with verbal and written material and guidebook.  Patient learns why the best way to stop diseases of our time is prevention, through food and other lifestyle changes. Medicine (such as prescription pills and surgeries) is often only a Band-Aid on the problem, not a long-term solution. Most common diseases of our time include obesity, type 2 diabetes, hypertension, heart disease, and cancer. The Pritikin Program is recommended and has been proven to help reduce, reverse, and/or prevent the damaging effects of metabolic syndrome.  Nutrition   Overview of the Pritikin Eating Plan  Clinical staff conducted group or  individual video education with verbal and  written material and guidebook.  Patient learns about the Pritikin Eating Plan for disease risk reduction. The Pritikin Eating Plan emphasizes a wide variety of unrefined, minimally-processed carbohydrates, like fruits, vegetables, whole grains, and legumes. Go, Caution, and Stop food choices are explained. Plant-based and lean animal proteins are emphasized. Rationale provided for low sodium intake for blood pressure control, low added sugars for blood sugar stabilization, and low added fats and oils for coronary artery disease risk reduction and weight management.  Calorie Density  Clinical staff conducted group or individual video education with verbal and written material and guidebook.  Patient learns about calorie density and how it impacts the Pritikin Eating Plan. Knowing the characteristics of the food you choose will help you decide whether those foods will lead to weight gain or weight loss, and whether you want to consume more or less of them. Weight loss is usually a side effect of the Pritikin Eating Plan because of its focus on low calorie-dense foods.  Label Reading  Clinical staff conducted group or individual video education with verbal and written material and guidebook.  Patient learns about the Pritikin recommended label reading guidelines and corresponding recommendations regarding calorie density, added sugars, sodium content, and whole grains.  Dining Out - Part 1  Clinical staff conducted group or individual video education with verbal and written material and guidebook.  Patient learns that restaurant meals can be sabotaging because they can be so high in calories, fat, sodium, and/or sugar. Patient learns recommended strategies on how to positively address this and avoid unhealthy pitfalls.  Facts on Fats  Clinical staff conducted group or individual video education with verbal and written material and guidebook.  Patient learns  that lifestyle modifications can be just as effective, if not more so, as many medications for lowering your risk of heart disease. A Pritikin lifestyle can help to reduce your risk of inflammation and atherosclerosis (cholesterol build-up, or plaque, in the artery walls). Lifestyle interventions such as dietary choices and physical activity address the cause of atherosclerosis. A review of the types of fats and their impact on blood cholesterol levels, along with dietary recommendations to reduce fat intake is also included.  Nutrition Action Plan  Clinical staff conducted group or individual video education with verbal and written material and guidebook.  Patient learns how to incorporate Pritikin recommendations into their lifestyle. Recommendations include planning and keeping personal health goals in mind as an important part of their success.  Healthy Mind-Set    Healthy Minds, Bodies, Hearts  Clinical staff conducted group or individual video education with verbal and written material and guidebook.  Patient learns how to identify when they are stressed. Video will discuss the impact of that stress, as well as the many benefits of stress management. Patient will also be introduced to stress management techniques. The way we think, act, and feel has an impact on our hearts.  How Our Thoughts Can Heal Our Hearts  Clinical staff conducted group or individual video education with verbal and written material and guidebook.  Patient learns that negative thoughts can cause depression and anxiety. This can result in negative lifestyle behavior and serious health problems. Cognitive behavioral therapy is an effective method to help control our thoughts in order to change and improve our emotional outlook.  Additional Videos:  Exercise    Improving Performance  Clinical staff conducted group or individual video education with verbal and written material and guidebook.  Patient learns to use a  non-linear approach by  alternating intensity levels and lengths of time spent exercising to help burn more calories and lose more body fat. Cardiovascular exercise helps improve heart health, metabolism, hormonal balance, blood sugar control, and recovery from fatigue. Resistance training improves strength, endurance, balance, coordination, reaction time, metabolism, and muscle mass. Flexibility exercise improves circulation, posture, and balance. Seek guidance from your physician and exercise physiologist before implementing an exercise routine and learn your capabilities and proper form for all exercise.  Introduction to Yoga  Clinical staff conducted group or individual video education with verbal and written material and guidebook.  Patient learns about yoga, a discipline of the coming together of mind, breath, and body. The benefits of yoga include improved flexibility, improved range of motion, better posture and core strength, increased lung function, weight loss, and positive self-image. Yoga's heart health benefits include lowered blood pressure, healthier heart rate, decreased cholesterol and triglyceride levels, improved immune function, and reduced stress. Seek guidance from your physician and exercise physiologist before implementing an exercise routine and learn your capabilities and proper form for all exercise.  Medical   Aging: Enhancing Your Quality of Life  Clinical staff conducted group or individual video education with verbal and written material and guidebook.  Patient learns key strategies and recommendations to stay in good physical health and enhance quality of life, such as prevention strategies, having an advocate, securing a Health Care Proxy and Power of Attorney, and keeping a list of medications and system for tracking them. It also discusses how to avoid risk for bone loss.  Biology of Weight Control  Clinical staff conducted group or individual video education with  verbal and written material and guidebook.  Patient learns that weight gain occurs because we consume more calories than we burn (eating more, moving less). Even if your body weight is normal, you may have higher ratios of fat compared to muscle mass. Too much body fat puts you at increased risk for cardiovascular disease, heart attack, stroke, type 2 diabetes, and obesity-related cancers. In addition to exercise, following the Pritikin Eating Plan can help reduce your risk.  Decoding Lab Results  Clinical staff conducted group or individual video education with verbal and written material and guidebook.  Patient learns that lab test reflects one measurement whose values change over time and are influenced by many factors, including medication, stress, sleep, exercise, food, hydration, pre-existing medical conditions, and more. It is recommended to use the knowledge from this video to become more involved with your lab results and evaluate your numbers to speak with your doctor.   Diseases of Our Time - Overview  Clinical staff conducted group or individual video education with verbal and written material and guidebook.  Patient learns that according to the CDC, 50% to 70% of chronic diseases (such as obesity, type 2 diabetes, elevated lipids, hypertension, and heart disease) are avoidable through lifestyle improvements including healthier food choices, listening to satiety cues, and increased physical activity.  Sleep Disorders Clinical staff conducted group or individual video education with verbal and written material and guidebook.  Patient learns how good quality and duration of sleep are important to overall health and well-being. Patient also learns about sleep disorders and how they impact health along with recommendations to address them, including discussing with a physician.  Nutrition  Dining Out - Part 2 Clinical staff conducted group or individual video education with verbal and  written material and guidebook.  Patient learns how to plan ahead and communicate in order to maximize their dining  experience in a healthy and nutritious manner. Included are recommended food choices based on the type of restaurant the patient is visiting.   Fueling a Banker conducted group or individual video education with verbal and written material and guidebook.  There is a strong connection between our food choices and our health. Diseases like obesity and type 2 diabetes are very prevalent and are in large-part due to lifestyle choices. The Pritikin Eating Plan provides plenty of food and hunger-curbing satisfaction. It is easy to follow, affordable, and helps reduce health risks.  Menu Workshop  Clinical staff conducted group or individual video education with verbal and written material and guidebook.  Patient learns that restaurant meals can sabotage health goals because they are often packed with calories, fat, sodium, and sugar. Recommendations include strategies to plan ahead and to communicate with the manager, chef, or server to help order a healthier meal.  Planning Your Eating Strategy  Clinical staff conducted group or individual video education with verbal and written material and guidebook.  Patient learns about the Pritikin Eating Plan and its benefit of reducing the risk of disease. The Pritikin Eating Plan does not focus on calories. Instead, it emphasizes high-quality, nutrient-rich foods. By knowing the characteristics of the foods, we choose, we can determine their calorie density and make informed decisions.  Targeting Your Nutrition Priorities  Clinical staff conducted group or individual video education with verbal and written material and guidebook.  Patient learns that lifestyle habits have a tremendous impact on disease risk and progression. This video provides eating and physical activity recommendations based on your personal health goals,  such as reducing LDL cholesterol, losing weight, preventing or controlling type 2 diabetes, and reducing high blood pressure.  Vitamins and Minerals  Clinical staff conducted group or individual video education with verbal and written material and guidebook.  Patient learns different ways to obtain key vitamins and minerals, including through a recommended healthy diet. It is important to discuss all supplements you take with your doctor.   Healthy Mind-Set    Smoking Cessation  Clinical staff conducted group or individual video education with verbal and written material and guidebook.  Patient learns that cigarette smoking and tobacco addiction pose a serious health risk which affects millions of people. Stopping smoking will significantly reduce the risk of heart disease, lung disease, and many forms of cancer. Recommended strategies for quitting are covered, including working with your doctor to develop a successful plan.  Culinary   Becoming a Set designer conducted group or individual video education with verbal and written material and guidebook.  Patient learns that cooking at home can be healthy, cost-effective, quick, and puts them in control. Keys to cooking healthy recipes will include looking at your recipe, assessing your equipment needs, planning ahead, making it simple, choosing cost-effective seasonal ingredients, and limiting the use of added fats, salts, and sugars.  Cooking - Breakfast and Snacks  Clinical staff conducted group or individual video education with verbal and written material and guidebook.  Patient learns how important breakfast is to satiety and nutrition through the entire day. Recommendations include key foods to eat during breakfast to help stabilize blood sugar levels and to prevent overeating at meals later in the day. Planning ahead is also a key component.  Cooking - Educational psychologist conducted group or individual video  education with verbal and written material and guidebook.  Patient learns eating strategies to improve overall health,  including an approach to cook more at home. Recommendations include thinking of animal protein as a side on your plate rather than center stage and focusing instead on lower calorie dense options like vegetables, fruits, whole grains, and plant-based proteins, such as beans. Making sauces in large quantities to freeze for later and leaving the skin on your vegetables are also recommended to maximize your experience.  Cooking - Healthy Salads and Dressing Clinical staff conducted group or individual video education with verbal and written material and guidebook.  Patient learns that vegetables, fruits, whole grains, and legumes are the foundations of the Pritikin Eating Plan. Recommendations include how to incorporate each of these in flavorful and healthy salads, and how to create homemade salad dressings. Proper handling of ingredients is also covered. Cooking - Soups and [employer] - Soups and Desserts Clinical staff conducted group or individual video education with verbal and written material and guidebook.  Patient learns that Pritikin soups and desserts make for easy, nutritious, and delicious snacks and meal components that are low in sodium, fat, sugar, and calorie density, while high in vitamins, minerals, and filling fiber. Recommendations include simple and healthy ideas for soups and desserts.   Overview     The Pritikin Solution Program Overview Clinical staff conducted group or individual video education with verbal and written material and guidebook.  Patient learns that the results of the Pritikin Program have been documented in more than 100 articles published in peer-reviewed journals, and the benefits include reducing risk factors for (and, in some cases, even reversing) high cholesterol, high blood pressure, type 2 diabetes, obesity, and more! An overview of  the three key pillars of the Pritikin Program will be covered: eating well, doing regular exercise, and having a healthy mind-set.  WORKSHOPS  Exercise: Exercise Basics: Building Your Action Plan Clinical staff led group instruction and group discussion with PowerPoint presentation and patient guidebook. To enhance the learning environment the use of posters, models and videos may be added. At the conclusion of this workshop, patients will comprehend the difference between physical activity and exercise, as well as the benefits of incorporating both, into their routine. Patients will understand the FITT (Frequency, Intensity, Time, and Type) principle and how to use it to build an exercise action plan. In addition, safety concerns and other considerations for exercise and cardiac rehab will be addressed by the presenter. The purpose of this lesson is to promote a comprehensive and effective weekly exercise routine in order to improve patients' overall level of fitness.   Managing Heart Disease: Your Path to a Healthier Heart Clinical staff led group instruction and group discussion with PowerPoint presentation and patient guidebook. To enhance the learning environment the use of posters, models and videos may be added.At the conclusion of this workshop, patients will understand the anatomy and physiology of the heart. Additionally, they will understand how Pritikin's three pillars impact the risk factors, the progression, and the management of heart disease.  The purpose of this lesson is to provide a high-level overview of the heart, heart disease, and how the Pritikin lifestyle positively impacts risk factors.  Exercise Biomechanics Clinical staff led group instruction and group discussion with PowerPoint presentation and patient guidebook. To enhance the learning environment the use of posters, models and videos may be added. Patients will learn how the structural parts of their bodies  function and how these functions impact their daily activities, movement, and exercise. Patients will learn how to promote a neutral spine, learn  how to manage pain, and identify ways to improve their physical movement in order to promote healthy living. The purpose of this lesson is to expose patients to common physical limitations that impact physical activity. Participants will learn practical ways to adapt and manage aches and pains, and to minimize their effect on regular exercise. Patients will learn how to maintain good posture while sitting, walking, and lifting.  Balance Training and Fall Prevention  Clinical staff led group instruction and group discussion with PowerPoint presentation and patient guidebook. To enhance the learning environment the use of posters, models and videos may be added. At the conclusion of this workshop, patients will understand the importance of their sensorimotor skills (vision, proprioception, and the vestibular system) in maintaining their ability to balance as they age. Patients will apply a variety of balancing exercises that are appropriate for their current level of function. Patients will understand the common causes for poor balance, possible solutions to these problems, and ways to modify their physical environment in order to minimize their fall risk. The purpose of this lesson is to teach patients about the importance of maintaining balance as they age and ways to minimize their risk of falling.  WORKSHOPS   Nutrition:  Fueling a Ship broker led group instruction and group discussion with PowerPoint presentation and patient guidebook. To enhance the learning environment the use of posters, models and videos may be added. Patients will review the foundational principles of the Pritikin Eating Plan and understand what constitutes a serving size in each of the food groups. Patients will also learn Pritikin-friendly foods that are better  choices when away from home and review make-ahead meal and snack options. Calorie density will be reviewed and applied to three nutrition priorities: weight maintenance, weight loss, and weight gain. The purpose of this lesson is to reinforce (in a group setting) the key concepts around what patients are recommended to eat and how to apply these guidelines when away from home by planning and selecting Pritikin-friendly options. Patients will understand how calorie density may be adjusted for different weight management goals.  Mindful Eating  Clinical staff led group instruction and group discussion with PowerPoint presentation and patient guidebook. To enhance the learning environment the use of posters, models and videos may be added. Patients will briefly review the concepts of the Pritikin Eating Plan and the importance of low-calorie dense foods. The concept of mindful eating will be introduced as well as the importance of paying attention to internal hunger signals. Triggers for non-hunger eating and techniques for dealing with triggers will be explored. The purpose of this lesson is to provide patients with the opportunity to review the basic principles of the Pritikin Eating Plan, discuss the value of eating mindfully and how to measure internal cues of hunger and fullness using the Hunger Scale. Patients will also discuss reasons for non-hunger eating and learn strategies to use for controlling emotional eating.  Targeting Your Nutrition Priorities Clinical staff led group instruction and group discussion with PowerPoint presentation and patient guidebook. To enhance the learning environment the use of posters, models and videos may be added. Patients will learn how to determine their genetic susceptibility to disease by reviewing their family history. Patients will gain insight into the importance of diet as part of an overall healthy lifestyle in mitigating the impact of genetics and other  environmental insults. The purpose of this lesson is to provide patients with the opportunity to assess their personal nutrition priorities by looking  at their family history, their own health history and current risk factors. Patients will also be able to discuss ways of prioritizing and modifying the Pritikin Eating Plan for their highest risk areas  Menu  Clinical staff led group instruction and group discussion with PowerPoint presentation and patient guidebook. To enhance the learning environment the use of posters, models and videos may be added. Using menus brought in from [employer], or printed from [employer], patients will apply the Pritikin dining out guidelines that were presented in the [employer] video. Patients will also be able to practice these guidelines in a variety of provided scenarios. The purpose of this lesson is to provide patients with the opportunity to practice hands-on learning of the Pritikin Dining Out guidelines with actual menus and practice scenarios.  Label Reading Clinical staff led group instruction and group discussion with PowerPoint presentation and patient guidebook. To enhance the learning environment the use of posters, models and videos may be added. Patients will review and discuss the Pritikin label reading guidelines presented in Pritikin's Label Reading Educational series video. Using fool labels brought in from local grocery stores and markets, patients will apply the label reading guidelines and determine if the packaged food meet the Pritikin guidelines. The purpose of this lesson is to provide patients with the opportunity to review, discuss, and practice hands-on learning of the Pritikin Label Reading guidelines with actual packaged food labels. Cooking School  Pritikin's [employer] are designed to teach patients ways to prepare quick, simple, and affordable recipes at home. The importance of nutrition's role in  chronic disease risk reduction is reflected in its emphasis in the overall Pritikin program. By learning how to prepare essential core Pritikin Eating Plan recipes, patients will increase control over what they eat; be able to customize the flavor of foods without the use of added salt, sugar, or fat; and improve the quality of the food they consume. By learning a set of core recipes which are easily assembled, quickly prepared, and affordable, patients are more likely to prepare more healthy foods at home. These workshops focus on convenient breakfasts, simple entres, side dishes, and desserts which can be prepared with minimal effort and are consistent with nutrition recommendations for cardiovascular risk reduction. Cooking [employer] are taught by a Armed forces logistics/support/administrative officer (RD) who has been trained by the [employer]. The chef or RD has a clear understanding of the importance of minimizing - if not completely eliminating - added fat, sugar, and sodium in recipes. Throughout the series of Cooking School Workshop sessions, patients will learn about healthy ingredients and efficient methods of cooking to build confidence in their capability to prepare    Cooking School weekly topics:  Adding Flavor- Sodium-Free  Fast and Healthy Breakfasts  Powerhouse Plant-Based Proteins  Satisfying Salads and Dressings  Simple Sides and Sauces  International Cuisine-Spotlight on the [employer] Zones  Delicious Desserts  Savory Soups  [employer] - Meals in a Astronomer Appetizers and Snacks  Comforting Weekend Breakfasts  One-Pot Wonders   Fast Evening Meals  Landscape architect Your Pritikin Plate  WORKSHOPS   Healthy Mindset (Psychosocial):  Focused Goals, Sustainable Changes Clinical staff led group instruction and group discussion with PowerPoint presentation and patient guidebook. To enhance the learning environment the use of posters, models and videos may  be added. Patients will be able to apply effective goal setting strategies to establish at least one personal goal, and  then take consistent, meaningful action toward that goal. They will learn to identify common barriers to achieving personal goals and develop strategies to overcome them. Patients will also gain an understanding of how our mind-set can impact our ability to achieve goals and the importance of cultivating a positive and growth-oriented mind-set. The purpose of this lesson is to provide patients with a deeper understanding of how to set and achieve personal goals, as well as the tools and strategies needed to overcome common obstacles which may arise along the way.  From Head to Heart: The Power of a Healthy Outlook  Clinical staff led group instruction and group discussion with PowerPoint presentation and patient guidebook. To enhance the learning environment the use of posters, models and videos may be added. Patients will be able to recognize and describe the impact of emotions and mood on physical health. They will discover the importance of self-care and explore self-care practices which may work for them. Patients will also learn how to utilize the 4 C's to cultivate a healthier outlook and better manage stress and challenges. The purpose of this lesson is to demonstrate to patients how a healthy outlook is an essential part of maintaining good health, especially as they continue their cardiac rehab journey.  Healthy Sleep for a Healthy Heart Clinical staff led group instruction and group discussion with PowerPoint presentation and patient guidebook. To enhance the learning environment the use of posters, models and videos may be added. At the conclusion of this workshop, patients will be able to demonstrate knowledge of the importance of sleep to overall health, well-being, and quality of life. They will understand the symptoms of, and treatments for, common sleep disorders. Patients  will also be able to identify daytime and nighttime behaviors which impact sleep, and they will be able to apply these tools to help manage sleep-related challenges. The purpose of this lesson is to provide patients with a general overview of sleep and outline the importance of quality sleep. Patients will learn about a few of the most common sleep disorders. Patients will also be introduced to the concept of "sleep hygiene," and discover ways to self-manage certain sleeping problems through simple daily behavior changes. Finally, the workshop will motivate patients by clarifying the links between quality sleep and their goals of heart-healthy living.   Recognizing and Reducing Stress Clinical staff led group instruction and group discussion with PowerPoint presentation and patient guidebook. To enhance the learning environment the use of posters, models and videos may be added. At the conclusion of this workshop, patients will be able to understand the types of stress reactions, differentiate between acute and chronic stress, and recognize the impact that chronic stress has on their health. They will also be able to apply different coping mechanisms, such as reframing negative self-talk. Patients will have the opportunity to practice a variety of stress management techniques, such as deep abdominal breathing, progressive muscle relaxation, and/or guided imagery.  The purpose of this lesson is to educate patients on the role of stress in their lives and to provide healthy techniques for coping with it.  Learning Barriers/Preferences:  Learning Barriers/Preferences - 12/24/22 1351       Learning Barriers/Preferences   Learning Barriers Sight;Hearing    Learning Preferences Audio;Computer/Internet;Group Instruction;Individual Instruction;Pictoral;Skilled Demonstration;Verbal Instruction;Video;Written Material             Education Topics:  Knowledge Questionnaire Score:  Knowledge Questionnaire  Score - 12/24/22 1352       Knowledge Questionnaire Score  Pre Score 21/24             Core Components/Risk Factors/Patient Goals at Admission:  Personal Goals and Risk Factors at Admission - 12/24/22 1353       Core Components/Risk Factors/Patient Goals on Admission   Improve shortness of breath with ADL's Yes    Intervention Provide education, individualized exercise plan and daily activity instruction to help decrease symptoms of SOB with activities of daily living.    Expected Outcomes Short Term: Improve cardiorespiratory fitness to achieve a reduction of symptoms when performing ADLs;Long Term: Be able to perform more ADLs without symptoms or delay the onset of symptoms    Lipids Yes    Intervention Provide education and support for participant on nutrition & aerobic/resistive exercise along with prescribed medications to achieve LDL 70mg , HDL >40mg .    Expected Outcomes Short Term: Participant states understanding of desired cholesterol values and is compliant with medications prescribed. Participant is following exercise prescription and nutrition guidelines.;Long Term: Cholesterol controlled with medications as prescribed, with individualized exercise RX and with personalized nutrition plan. Value goals: LDL < 70mg , HDL > 40 mg.             Core Components/Risk Factors/Patient Goals Review:   Goals and Risk Factor Review     Row Name 12/30/22 1724 01/28/23 1339 02/26/23 1433         Core Components/Risk Factors/Patient Goals Review   Personal Goals Review Weight Management/Obesity;Improve shortness of breath with ADL's;Lipids Weight Management/Obesity;Improve shortness of breath with ADL's;Lipids Weight Management/Obesity;Improve shortness of breath with ADL's;Lipids     Review Austinjames started cardiac rehab on 12/30/22 and did well with exercise. Vital signs were stable. Oxygen saturations ranged from 90-96% on room air Irfan is doing  well with exercise at cardiac  rehab. . Vital signs have been stable. Lakim rides his electric bike with a group of friends daily. Domingos has increased his workloads and says cardiac rehab has been helpful for him. Tyrei is doing  well with exercise at cardiac rehab. . Vital signs have been stable.     Expected Outcomes Steward will continue to participate in cardiac rehab for exercise, nutrition and lifestyle modifications Royale will continue to participate in cardiac rehab for exercise, nutrition and lifestyle modifications Tiron will continue to participate in cardiac rehab for exercise, nutrition and lifestyle modifications              Core Components/Risk Factors/Patient Goals at Discharge (Final Review):   Goals and Risk Factor Review - 02/26/23 1433       Core Components/Risk Factors/Patient Goals Review   Personal Goals Review Weight Management/Obesity;Improve shortness of breath with ADL's;Lipids    Review Ashad is doing  well with exercise at cardiac rehab. . Vital signs have been stable.    Expected Outcomes Temidayo will continue to participate in cardiac rehab for exercise, nutrition and lifestyle modifications             ITP Comments:  ITP Comments     Row Name 12/24/22 1025 12/30/22 1722 01/28/23 1135 01/28/23 1337     ITP Comments Armanda Magic, MD:  Medical Director.  Introduction to the Pritikin education program/Intensive cardiac rehab.  Initial oreintation packet reviewed with the patient. 30 day ITP Review. Yogi started cardiac rehab on 12/30/22 and did well with exercise. 30 day ITP Review. Taheem started cardiac rehab on 12/30/22 and did well with exercise. 30 day ITP Review. Hershel has good attendance and participation in cardiac rehab. Loraine Leriche  is doing well with exercise             Comments: See ITP comments.Thayer Headings RN BSN

## 2023-02-28 ENCOUNTER — Encounter (HOSPITAL_COMMUNITY)
Admission: RE | Admit: 2023-02-28 | Discharge: 2023-02-28 | Disposition: A | Discharge status: Home / Self Care | Payer: BC Managed Care – PPO | Source: Ambulatory Visit | Attending: Cardiology | Admitting: Cardiology

## 2023-02-28 DIAGNOSIS — Z952 Presence of prosthetic heart valve: Secondary | ICD-10-CM | POA: Diagnosis not present

## 2023-03-03 ENCOUNTER — Encounter (HOSPITAL_COMMUNITY): Payer: BC Managed Care – PPO

## 2023-03-05 ENCOUNTER — Encounter (HOSPITAL_COMMUNITY)
Admission: RE | Admit: 2023-03-05 | Discharge: 2023-03-05 | Disposition: A | Discharge status: Home / Self Care | Payer: BC Managed Care – PPO | Source: Ambulatory Visit | Attending: Cardiology | Admitting: Cardiology

## 2023-03-05 DIAGNOSIS — Z952 Presence of prosthetic heart valve: Secondary | ICD-10-CM | POA: Insufficient documentation

## 2023-03-07 ENCOUNTER — Encounter (HOSPITAL_COMMUNITY)
Admission: RE | Admit: 2023-03-07 | Discharge: 2023-03-07 | Disposition: A | Discharge status: Home / Self Care | Payer: BC Managed Care – PPO | Source: Ambulatory Visit | Attending: Cardiology

## 2023-03-07 DIAGNOSIS — Z952 Presence of prosthetic heart valve: Secondary | ICD-10-CM | POA: Diagnosis not present

## 2023-03-10 ENCOUNTER — Encounter (HOSPITAL_COMMUNITY): Payer: BC Managed Care – PPO

## 2023-03-11 ENCOUNTER — Ambulatory Visit: Payer: BC Managed Care – PPO | Admitting: Internal Medicine

## 2023-03-12 ENCOUNTER — Encounter (HOSPITAL_COMMUNITY)
Admission: RE | Admit: 2023-03-12 | Discharge: 2023-03-12 | Disposition: A | Discharge status: Home / Self Care | Payer: BC Managed Care – PPO | Source: Ambulatory Visit | Attending: Cardiology | Admitting: Cardiology

## 2023-03-12 DIAGNOSIS — Z952 Presence of prosthetic heart valve: Secondary | ICD-10-CM

## 2023-03-14 ENCOUNTER — Encounter (HOSPITAL_COMMUNITY): Payer: BC Managed Care – PPO

## 2023-03-14 DIAGNOSIS — J449 Chronic obstructive pulmonary disease, unspecified: Secondary | ICD-10-CM | POA: Diagnosis not present

## 2023-03-17 ENCOUNTER — Encounter (HOSPITAL_COMMUNITY): Payer: BC Managed Care – PPO

## 2023-03-17 ENCOUNTER — Telehealth: Payer: Self-pay | Admitting: Internal Medicine

## 2023-03-17 NOTE — Telephone Encounter (Signed)
Patient states having symptom of shortness of breath.No available appointments at this time. Pharmacy is [facility] [address]. Patient phone number is [phone].

## 2023-03-18 NOTE — Telephone Encounter (Signed)
Called the pt and there was no answer- LMTCB    

## 2023-03-19 ENCOUNTER — Encounter (HOSPITAL_COMMUNITY): Payer: BC Managed Care – PPO

## 2023-03-19 NOTE — Telephone Encounter (Signed)
Called the pt again and still no answer- LMTCB and closing per protocol.

## 2023-03-26 ENCOUNTER — Telehealth (HOSPITAL_COMMUNITY): Payer: Self-pay | Admitting: *Deleted

## 2023-03-26 NOTE — Progress Notes (Signed)
Cardiac Individual Treatment Plan  Patient Details  Name: Christopher Arias MRN: [id number] Date of Birth: 12/13/1960 Referring Provider:   Flowsheet Row INTENSIVE CARDIAC REHAB ORIENT from 12/24/2022 in [facility]  Referring Provider [name], MD       Initial Encounter Date:  Flowsheet Row INTENSIVE CARDIAC REHAB ORIENT from 12/24/2022 in [facility]  Date 12/24/22       Visit Diagnosis: 11/04/22 Status post aortic valve replacement  Patient's Home Medications on Admission:  Current Outpatient Medications:    albuterol (PROVENTIL) (2.5 MG/3ML) 0.083% nebulizer solution, Take 3 mLs (2.5 mg total) by nebulization every 6 (six) hours as needed for wheezing or shortness of breath. (Patient not taking: Reported on 02/17/2023), Disp: 75 mL, Rfl: 12   albuterol (VENTOLIN HFA) 108 (90 Base) MCG/ACT inhaler, Inhale 2 puffs into the lungs every 6 (six) hours as needed for wheezing or shortness of breath., Disp: 18 g, Rfl: 3   aspirin EC 81 MG tablet, Take 1 tablet (81 mg total) by mouth daily. Swallow whole., Disp: , Rfl:    atorvastatin (LIPITOR) 20 MG tablet, Take 1 tablet (20 mg total) by mouth daily., Disp: 90 tablet, Rfl: 3   Budeson-Glycopyrrol-Formoterol (BREZTRI AEROSPHERE) 160-9-4.8 MCG/ACT AERO, Inhale 2 puffs into the lungs in the morning and at bedtime., Disp: 10.7 g, Rfl: 5   clonazePAM (KLONOPIN) 1 MG tablet, Take 1 tablet (1 mg total) by mouth 2 (two) times daily as needed., Disp: 60 tablet, Rfl: 2   diphenhydramine-acetaminophen (TYLENOL PM) 25-500 MG TABS tablet, Take 2 tablets by mouth at bedtime., Disp: , Rfl:    Fe Fum-Vit C-Vit B12-FA (TRIGELS-F FORTE) CAPS capsule, Take 1 capsule by mouth daily after breakfast., Disp: , Rfl: 0   levothyroxine (SYNTHROID) 100 MCG tablet, Take 1 tablet (100 mcg total) by mouth daily., Disp: 90 tablet, Rfl: 3    metoprolol succinate (TOPROL-XL) 50 MG 24 hr tablet, Take 1 tablet (50 mg total) by mouth daily. Take with or immediately following a meal., Disp: 90 tablet, Rfl: 3   ondansetron (ZOFRAN-ODT) 4 MG disintegrating tablet, Take 1 tablet (4 mg total) by mouth every 8 (eight) hours as needed for nausea or vomiting., Disp: 30 tablet, Rfl: 1   oxyCODONE (OXY IR/ROXICODONE) 5 MG immediate release tablet, Take 1 tablet (5 mg total) by mouth every 6 (six) hours as needed for severe pain., Disp: 28 tablet, Rfl: 0   OXYGEN, Inhale 2-3 L into the lungs at bedtime. As needed during the day, Disp: , Rfl:    sertraline (ZOLOFT) 100 MG tablet, Take 2 tablets (200 mg total) by mouth daily., Disp: 180 tablet, Rfl: 3   tamsulosin (FLOMAX) 0.4 MG CAPS capsule, Take 1 capsule (0.4 mg total) by mouth daily., Disp: 90 capsule, Rfl: 2   varenicline (CHANTIX) 1 MG tablet, TAKE 1 TABLET BY MOUTH TWICE A DAY, Disp: 180 tablet, Rfl: 2  Past Medical History: Past Medical History:  Diagnosis Date   ANTERIOR PITUITARY HYPERFUNCTION 11/23/2009   ANXIETY 11/24/2007   COPD (chronic obstructive pulmonary disease) (HCC) 05/31/2021   GERD 11/24/2007   GYNECOMASTIA 11/10/2009   HYPERLIPIDEMIA 11/24/2007   HYPOTHYROIDISM, POST-RADIATION 03/16/2010   PONV (postoperative nausea and vomiting)    Severe aortic insufficiency     Tobacco Use: Social History   Tobacco Use  Smoking Status Former   Current packs/day: 0.00   Average packs/day: 1 pack/day  for 20.0 years (20.0 ttl pk-yrs)   Types: Cigarettes   Start date: 08/24/2001   Quit date: 08/24/2021   Years since quitting: 1.5  Smokeless Tobacco Never    Labs: Review Flowsheet  More data exists      Latest Ref Rng & Units 05/30/2021 05/21/2022 08/20/2022 10/31/2022 11/04/2022  Labs for ITP Cardiac and Pulmonary Rehab  Cholestrol 0 - 200 mg/dL 742  595  - - -  LDL (calc) 0 - 99 mg/dL 638  77  - - -  HDL-C >75.64 mg/dL 33.29  51.88  - - -  Trlycerides 0.0 - 149.0 mg/dL 41.6   60.6  - - -  Hemoglobin A1c 4.8 - 5.6 % 5.7  5.9  - 5.7  -  PH, Arterial 7.35 - 7.45 - - 7.359  7.41  7.324  7.321  7.296  7.318  7.365  7.257  7.373  7.359   PCO2 arterial 32 - 48 mmHg - - 35.6  37  42.7  43.2  46.0  44.2  39.2  45.1  36.1  38.7   Bicarbonate 20.0 - 28.0 mmol/L - - 21.2  21.7  20.1  23.5  22.1  22.4  22.7  22.7  22.6  20.4  21.0  23.0  21.8   TCO2 22 - 32 mmol/L - - 22  23  21   - 23  24  24  24  24  24  22  24  24  22  24  24  23  24  25    Acid-base deficit 0.0 - 2.0 mmol/L - - 4.0  4.0  5.0  0.8  4.0  4.0  4.0  3.0  3.0  7.0  4.0  3.0  3.0   O2 Saturation % - - 72  70  97  98.1  97  94  99  100  100  98  100  78  100     Details       Multiple values from one day are sorted in reverse-chronological order         Capillary Blood Glucose: Lab Results  Component Value Date   GLUCAP 116 (H) 11/06/2022   GLUCAP 115 (H) 11/06/2022   GLUCAP 118 (H) 11/06/2022   GLUCAP 128 (H) 11/06/2022   GLUCAP 110 (H) 11/06/2022     Exercise Target Goals: Exercise Program Goal: Individual exercise prescription set using results from initial 6 min walk test and THRR while considering  patient's activity barriers and safety.   Exercise Prescription Goal: Initial exercise prescription builds to 30-45 minutes a day of aerobic activity, 2-3 days per week.  Home exercise guidelines will be given to patient during program as part of exercise prescription that the participant will acknowledge.  Activity Barriers & Risk Stratification:  Activity Barriers & Cardiac Risk Stratification - 12/24/22 1346       Activity Barriers & Cardiac Risk Stratification   Activity Barriers Arthritis;Joint Problems;Shortness of Breath;Balance Concerns;Other (comment)    Comments Sternal precautions/ S/P PPM    Cardiac Risk Stratification High             6 Minute Walk:  6 Minute Walk     Row Name 12/24/22 1133         6 Minute Walk   Phase Initial     Distance 1291 feet     Walk Time 6  minutes     # of Rest Breaks 0     MPH 2.45  METS 3.4     RPE 11     Perceived Dyspnea  1     VO2 Peak 11.81     Symptoms Yes (comment)     Comments SOB, RPD = 1     Resting HR 86 bpm     Resting BP 106/70     Resting Oxygen Saturation  95 %     Exercise Oxygen Saturation  during 6 min walk 90 %     Max Ex. HR 105 bpm     Max Ex. BP 130/64     2 Minute Post BP 108/70       Interval HR   1 Minute HR 87     2 Minute HR 102     3 Minute HR 101     4 Minute HR 100     5 Minute HR 103     6 Minute HR 103     2 Minute Post HR 87     Interval Heart Rate? Yes       Interval Oxygen   Interval Oxygen? Yes     Baseline Oxygen Saturation % 95 %     1 Minute Oxygen Saturation % 92 %     1 Minute Liters of Oxygen 0 L     2 Minute Oxygen Saturation % 91 %     2 Minute Liters of Oxygen 0 L     3 Minute Oxygen Saturation % 90 %     3 Minute Liters of Oxygen 0 L     4 Minute Oxygen Saturation % 90 %     4 Minute Liters of Oxygen 0 L     5 Minute Oxygen Saturation % 90 %     5 Minute Liters of Oxygen 0 L     6 Minute Oxygen Saturation % 90 %     6 Minute Liters of Oxygen 0 L     2 Minute Post Oxygen Saturation % 94 %     2 Minute Post Liters of Oxygen 0 L              Oxygen Initial Assessment:   Oxygen Re-Evaluation:   Oxygen Discharge (Final Oxygen Re-Evaluation):   Initial Exercise Prescription:  Initial Exercise Prescription - 12/24/22 1300       Date of Initial Exercise RX and Referring Provider   Date 12/24/22    Referring Provider [name], MD    Expected Discharge Date 03/19/23      Bike   Level 1    Watts 33    Minutes 15    METs 3.4      NuStep   Level 1    SPM 75    Minutes 15    METs 3.4      Prescription Details   Frequency (times per week) 3    Duration Progress to 30 minutes of continuous aerobic without signs/symptoms of physical distress      Intensity   THRR 40-80% of Max Heartrate 64-127    Ratings of Perceived  Exertion 11-13    Perceived Dyspnea 0-4      Progression   Progression Continue progressive overload as per policy without signs/symptoms or physical distress.      Resistance Training   Training Prescription Yes    Weight 3 lbs    Reps 10-15             Perform Capillary Blood Glucose checks as needed.  Exercise Prescription  Changes:   Exercise Prescription Changes     Row Name 12/30/22 1701 01/17/23 1659 02/05/23 1633 02/28/23 1625       Response to Exercise   Blood Pressure (Admit) 117/78 110/66 118/72 118/82    Blood Pressure (Exercise) 120/80 114/72 126/72 138/80    Blood Pressure (Exit) 106/70 117/81 110/68 108/68    Heart Rate (Admit) 83 bpm 74 bpm 73 bpm 74 bpm    Heart Rate (Exercise) 114 bpm 106 bpm 103 bpm 99 bpm    Heart Rate (Exit) 89 bpm 80 bpm 79 bpm 79 bpm    Oxygen Saturation (Admit) 96 % -- -- --    Oxygen Saturation (Exercise) 90 % -- -- 90 %    Oxygen Saturation (Exit) 96 % -- -- --    Rating of Perceived Exertion (Exercise) 9 11 11 11     Perceived Dyspnea (Exercise) 0 0 0 0    Symptoms Some mild SOB, resolved with rest, COPD on RA -- -- SOB DYS 2 durning exercise    Comments Pt first day in the [employer] program Reviewed MET's, goals and home ExRx Reviewed MET's Reviewed MET's and goals    Duration Progress to 30 minutes of  aerobic without signs/symptoms of physical distress Progress to 30 minutes of  aerobic without signs/symptoms of physical distress Progress to 30 minutes of  aerobic without signs/symptoms of physical distress Progress to 30 minutes of  aerobic without signs/symptoms of physical distress    Intensity THRR unchanged THRR unchanged THRR unchanged THRR unchanged      Progression   Progression Continue to progress workloads to maintain intensity without signs/symptoms of physical distress. Continue to progress workloads to maintain intensity without signs/symptoms of physical distress. Continue to progress workloads to maintain  intensity without signs/symptoms of physical distress. Continue to progress workloads to maintain intensity without signs/symptoms of physical distress.    Average METs 2.15 4.15 3.2 2.7      Resistance Training   Training Prescription Yes Yes No No    Weight 3 lbs 4 lbs wts 4 lbs wts 4 lbs wts    Reps 10-15 10-15 10-15 10-15    Time 10 Minutes 10 Minutes 10 Minutes 10 Minutes      Bike   Level 1 3.5 -- --    Watts 17 -- -- --    Minutes 15 15 -- --    METs 2.4 5.1 -- --      NuStep   Level 1 3 3 3     SPM 80 94 83 75    Minutes 15 15 15 15     METs 1.9 3.2 3.2 2.7      Home Exercise Plan   Plans to continue exercise at -- Home (comment) Home (comment) Home (comment)    Frequency -- Add 4 additional days to program exercise sessions. Add 4 additional days to program exercise sessions. Add 4 additional days to program exercise sessions.    Initial Home Exercises Provided -- 01/17/23 01/17/23 01/17/23             Exercise Comments:   Exercise Comments     Row Name 12/30/22 1707 01/17/23 1708 02/05/23 1635 02/26/23 1622 02/28/23 1632   Exercise Comments Pt first day in the [facility] program. Pt tolerated exercise well with an average MET level of 2.15. Pt is learning his THRR, RPE and ExRx. Off to a great start Reviewed MET's, goals and home ExRx. Pt tolerated exercise well with an average  MET level of 4.15. Pt feels good about his goals, he's back to riding his bike with friends, he has better control of his SOB and is increasing his strength. Pt will add in walking and bike riding 4 days for 30-45 mins per session. Reviewed MET's. Pt tolerated exercise well with an average MET level of 4.15. Pt is doing well overall Patient absent today. Will review education on his return Reviewed MET's and goals. Pt tolerated exercise well with an average MET level of 2.7. Pt feels good about his goals, he is having issues wih his SOB but is working with his pulmonologist and different  breathing technique. He does state that he is a lot better than when he started. It's just ups and downs    Row Name 03/19/23 1630           Exercise Comments Pt grad date was set for today, but he's not feeling well. He will return when feeling better for his graduation and post .                Exercise Goals and Review:   Exercise Goals     Row Name 12/24/22 1041             Exercise Goals   Increase Physical Activity Yes       Intervention Provide advice, education, support and counseling about physical activity/exercise needs.;Develop an individualized exercise prescription for aerobic and resistive training based on initial evaluation findings, risk stratification, comorbidities and participant's personal goals.       Expected Outcomes Short Term: Attend rehab on a regular basis to increase amount of physical activity.;Long Term: Exercising regularly at least 3-5 days a week.;Long Term: Add in home exercise to make exercise part of routine and to increase amount of physical activity.       Increase Strength and Stamina Yes       Intervention Provide advice, education, support and counseling about physical activity/exercise needs.;Develop an individualized exercise prescription for aerobic and resistive training based on initial evaluation findings, risk stratification, comorbidities and participant's personal goals.       Expected Outcomes Short Term: Increase workloads from initial exercise prescription for resistance, speed, and METs.;Short Term: Perform resistance training exercises routinely during rehab and add in resistance training at home;Long Term: Improve cardiorespiratory fitness, muscular endurance and strength as measured by increased METs and functional capacity ( )       Able to understand and use rate of perceived exertion (RPE) scale Yes       Intervention Provide education and explanation on how to use RPE scale       Expected Outcomes Short Term: Able  to use RPE daily in rehab to express subjective intensity level;Long Term:  Able to use RPE to guide intensity level when exercising independently       Knowledge and understanding of Target Heart Rate Range (THRR) Yes       Intervention Provide education and explanation of THRR including how the numbers were predicted and where they are located for reference       Expected Outcomes Short Term: Able to state/look up THRR;Long Term: Able to use THRR to govern intensity when exercising independently;Short Term: Able to use daily as guideline for intensity in rehab       Understanding of Exercise Prescription Yes       Intervention Provide education, explanation, and written materials on patient's individual exercise prescription       Expected Outcomes Short  Term: Able to explain program exercise prescription;Long Term: Able to explain home exercise prescription to exercise independently                Exercise Goals Re-Evaluation :  Exercise Goals Re-Evaluation     Row Name 12/30/22 1705 01/17/23 1704 02/28/23 1628         Exercise Goal Re-Evaluation   Exercise Goals Review Increase Physical Activity;Understanding of Exercise Prescription;Increase Strength and Stamina;Knowledge and understanding of Target Heart Rate Range (THRR);Able to understand and use rate of perceived exertion (RPE) scale Increase Physical Activity;Understanding of Exercise Prescription;Increase Strength and Stamina;Knowledge and understanding of Target Heart Rate Range (THRR);Able to understand and use rate of perceived exertion (RPE) scale Increase Physical Activity;Understanding of Exercise Prescription;Increase Strength and Stamina;Knowledge and understanding of Target Heart Rate Range (THRR);Able to understand and use rate of perceived exertion (RPE) scale     Comments Pt first day in the [facility] program. Pt tolerated exercise well with an average MET level of 2.15. Pt is learning his THRR, RPE and ExRx. Off to a  great start Reviewed MET's, goals and home ExRx. Pt tolerated exercise well with an average MET level of 4.15. Pt feels good about his goals, he's back to riding his bike with friends, he has better control of his SOB and is increasing his strength. Pt will add in walking and bike riding 4 days for 30-45 mins per session. Reviewed MET's and goals. Pt tolerated exercise well with an average MET level of 2.7. Pt feels good about his goals, he is having issues wih his SOB but is working with his pulmonologist and different breathing technique. He does state that he is a lot better than when he started. It's just ups and downs     Expected Outcomes Will continue to monitor and progress workloads as tolerated without sign or symptom Will continue to monitor and progress workloads as tolerated without sign or symptom Will continue to monitor and progress workloads as tolerated without sign or symptom              Discharge Exercise Prescription (Final Exercise Prescription Changes):  Exercise Prescription Changes - 02/28/23 1625       Response to Exercise   Blood Pressure (Admit) 118/82    Blood Pressure (Exercise) 138/80    Blood Pressure (Exit) 108/68    Heart Rate (Admit) 74 bpm    Heart Rate (Exercise) 99 bpm    Heart Rate (Exit) 79 bpm    Oxygen Saturation (Exercise) 90 %    Rating of Perceived Exertion (Exercise) 11    Perceived Dyspnea (Exercise) 0    Symptoms SOB DYS 2 durning exercise    Comments Reviewed MET's and goals    Duration Progress to 30 minutes of  aerobic without signs/symptoms of physical distress    Intensity THRR unchanged      Progression   Progression Continue to progress workloads to maintain intensity without signs/symptoms of physical distress.    Average METs 2.7      Resistance Training   Training Prescription No    Weight 4 lbs wts    Reps 10-15    Time 10 Minutes      NuStep   Level 3    SPM 75    Minutes 15    METs 2.7      Home Exercise Plan    Plans to continue exercise at Home (comment)    Frequency Add 4 additional days to program exercise  sessions.    Initial Home Exercises Provided 01/17/23             Nutrition:  Target Goals: Understanding of nutrition guidelines, daily intake of sodium 1500mg , cholesterol 200mg , calories 30% from fat and 7% or less from saturated fats, daily to have 5 or more servings of fruits and vegetables.  Biometrics:  Pre Biometrics - 12/24/22 1045       Pre Biometrics   Waist Circumference 38 inches    Hip Circumference 38.5 inches    Waist to Hip Ratio 0.99 %    Triceps Skinfold 14 mm    % Body Fat 26.4 %    Grip Strength 23 kg    Flexibility 10 in    Single Leg Stand 3.2 seconds              Nutrition Therapy Plan and Nutrition Goals:  Nutrition Therapy & Goals - 02/28/23 1431       Nutrition Therapy   Diet Heart Healthy Diet    Drug/Food Interactions Statins/Certain Fruits      Personal Nutrition Goals   Nutrition Goal Patient to identify strategies for reducing cardiovascular risk by attending the [facility] education and nutrition series weekly.   goal in progress.   Personal Goal #2 Patient to improve diet quality by using the plate method as a guide for meal planning to include lean protein/plant protein, fruits, vegetables, whole grains, nonfat dairy as part of a well-balanced diet.   goal in progress.   Personal Goal #3 Patient to reduce sodium to 1500mg  per day   goal in progress.   Comments Goals in progress. Giovonni continues to attend the [employer] and nutrition series regularly. He enjoys cooking and has implemented [employer] He has improved understanding of saturated fat, sodium, and fiber recommendations. He is up 4# since starting with our program.  Patient will benefit from participation in intensive cardiac rehab for nutrition, exercise, and lifestyle modification.      Intervention Plan   Intervention Prescribe, educate and counsel  regarding individualized specific dietary modifications aiming towards targeted core components such as weight, hypertension, lipid management, diabetes, heart failure and other comorbidities.;Nutrition handout(s) given to patient.    Expected Outcomes Short Term Goal: Understand basic principles of dietary content, such as calories, fat, sodium, cholesterol and nutrients.;Long Term Goal: Adherence to prescribed nutrition plan.             Nutrition Assessments:  Nutrition Assessments - 01/02/23 0944       Rate Your Plate Scores   Pre Score 54            MEDIFICTS Score Key: >=70 Need to make dietary changes  40-70 Heart Healthy Diet <= 40 Therapeutic Level Cholesterol Diet   Flowsheet Row INTENSIVE CARDIAC REHAB from 01/01/2023 in [facility]  Picture Your Plate Total Score on Admission 54      Picture Your Plate Scores: <40 Unhealthy dietary pattern with much room for improvement. 41-50 Dietary pattern unlikely to meet recommendations for good health and room for improvement. 51-60 More healthful dietary pattern, with some room for improvement.  >60 Healthy dietary pattern, although there may be some specific behaviors that could be improved.    Nutrition Goals Re-Evaluation:  Nutrition Goals Re-Evaluation     Row Name 01/01/23 1558 01/29/23 1615 02/28/23 1431         Goals   Current Weight 164 lb 10.9 oz (  74.7 kg) 167 lb 8.8 oz (76 kg) 169 lb 12.1 oz (77 kg)     Comment lipids WNL, A1c 5.7 no new labs; most recent labs  lipids WNL, A1c 5.7 no new labs; most recent labs lipids WNL, A1c 5.7     Expected Outcome Laurance reports eating a wide variety of foods. He enjoys cooking and is eager to learn more about heart healthy eating. Patient will benefit from participation in intensive cardiac rehab for nutrition, exercise, and lifestyle modification. Nisaiah continues to attend the [employer] and nutrition  series regularly. He enjoys cooking and has implemented [employer] He has improved understanding of saturated fat, sodium, and fiber recommendations. He is up 1.8# since starting with our program. Patient will benefit from participation in intensive cardiac rehab for nutrition, exercise, and lifestyle modification. Goals in progress. Korrey continues to attend the [employer] and nutrition series regularly. He enjoys cooking and has implemented [employer] He has improved understanding of saturated fat, sodium, and fiber recommendations. He is up 4# since starting with our program. Patient will benefit from participation in intensive cardiac rehab for nutrition, exercise, and lifestyle modification.              Nutrition Goals Re-Evaluation:  Nutrition Goals Re-Evaluation     Row Name 01/01/23 1558 01/29/23 1615 02/28/23 1431         Goals   Current Weight 164 lb 10.9 oz (74.7 kg) 167 lb 8.8 oz (76 kg) 169 lb 12.1 oz (77 kg)     Comment lipids WNL, A1c 5.7 no new labs; most recent labs  lipids WNL, A1c 5.7 no new labs; most recent labs lipids WNL, A1c 5.7     Expected Outcome Ayson reports eating a wide variety of foods. He enjoys cooking and is eager to learn more about heart healthy eating. Patient will benefit from participation in intensive cardiac rehab for nutrition, exercise, and lifestyle modification. Ames continues to attend the [employer] and nutrition series regularly. He enjoys cooking and has implemented [employer] He has improved understanding of saturated fat, sodium, and fiber recommendations. He is up 1.8# since starting with our program. Patient will benefit from participation in intensive cardiac rehab for nutrition, exercise, and lifestyle modification. Goals in progress. Agustin continues to attend the [employer] and nutrition series regularly. He enjoys cooking and has implemented [employer] He has improved  understanding of saturated fat, sodium, and fiber recommendations. He is up 4# since starting with our program. Patient will benefit from participation in intensive cardiac rehab for nutrition, exercise, and lifestyle modification.              Nutrition Goals Discharge (Final Nutrition Goals Re-Evaluation):  Nutrition Goals Re-Evaluation - 02/28/23 1431       Goals   Current Weight 169 lb 12.1 oz (77 kg)    Comment no new labs; most recent labs lipids WNL, A1c 5.7    Expected Outcome Goals in progress. Chrishawn continues to attend the [employer] and nutrition series regularly. He enjoys cooking and has implemented [employer] He has improved understanding of saturated fat, sodium, and fiber recommendations. He is up 4# since starting with our program. Patient will benefit from participation in intensive cardiac rehab for nutrition, exercise, and lifestyle modification.             Psychosocial: Target Goals: Acknowledge presence or absence of significant depression and/or stress, maximize coping skills, provide positive support  system. Participant is able to verbalize types and ability to use techniques and skills needed for reducing stress and depression.  Initial Review & Psychosocial Screening:  Initial Psych Review & Screening - 12/24/22 1350       Initial Review   Current issues with Current Sleep Concerns   Pt voices his sleep issues have been going on for many years     Family Dynamics   Good Support System? Yes   Pt has spouse and friends     Barriers   Psychosocial barriers to participate in program There are no identifiable barriers or psychosocial needs.      Screening Interventions   Interventions Encouraged to exercise             Quality of Life Scores:  Quality of Life - 03/19/23 1630       Quality of Life Scores   Health/Function Post 25.2 %    Socioeconomic Post 29.14 %    Family Post 25.5 %    GLOBAL Post 26.36 %             Scores of 19 and below usually indicate a poorer quality of life in these areas.  A difference of  2-3 points is a clinically meaningful difference.  A difference of 2-3 points in the total score of the Quality of Life Index has been associated with significant improvement in overall quality of life, self-image, physical symptoms, and general health in studies assessing change in quality of life.  PHQ-9: Review Flowsheet  More data exists      12/24/2022 11/20/2022 05/14/2022 11/27/2020 03/08/2019  Depression screen PHQ 2/9  Decreased Interest 0 0 0 0 0  Down, Depressed, Hopeless 0 0 0 0 0  PHQ - 2 Score 0 0 0 0 0  Altered sleeping 2 - - - -  Tired, decreased energy 0 - - - -  Change in appetite 0 - - - -  Feeling bad or failure about yourself  0 - - - -  Trouble concentrating 0 - - - -  Moving slowly or fidgety/restless 0 - - - -  Suicidal thoughts 0 - - - -  PHQ-9 Score 2 - - - -  Difficult doing work/chores Somewhat difficult - - - -    Details           Interpretation of Total Score  Total Score Depression Severity:  1-4 = Minimal depression, 5-9 = Mild depression, 10-14 = Moderate depression, 15-19 = Moderately severe depression, 20-27 = Severe depression   Psychosocial Evaluation and Intervention:   Psychosocial Re-Evaluation:  Psychosocial Re-Evaluation     Row Name 12/30/22 1722 01/28/23 1338 02/26/23 1432 03/26/23 0732       Psychosocial Re-Evaluation   Current issues with None Identified None Identified None Identified None Identified    Comments Giovonnie did not voice any concerns or stressors on his first day of exercise. -- -- last day of exercise was on 03/12/23    Interventions Stress management education;Encouraged to attend Cardiac Rehabilitation for the exercise;Relaxation education Stress management education;Encouraged to attend Cardiac Rehabilitation for the exercise;Relaxation education Stress management education;Encouraged to attend Cardiac  Rehabilitation for the exercise;Relaxation education Stress management education;Encouraged to attend Cardiac Rehabilitation for the exercise;Relaxation education    Continue Psychosocial Services  No Follow up required No Follow up required No Follow up required No Follow up required             Psychosocial Discharge (Final Psychosocial Re-Evaluation):  Psychosocial Re-Evaluation - 03/26/23 0732       Psychosocial Re-Evaluation   Current issues with None Identified    Comments last day of exercise was on 03/12/23    Interventions Stress management education;Encouraged to attend Cardiac Rehabilitation for the exercise;Relaxation education    Continue Psychosocial Services  No Follow up required             Vocational Rehabilitation: Provide vocational rehab assistance to qualifying candidates.   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 Participant will state understanding/return demonstration of topics presented.    Education     Row Name 12/30/22 1600     Education   Cardiac Education Topics Pritikin   Geographical information systems officer Psychosocial   Psychosocial Workshop Healthy Sleep for a Healthy Heart   Instruction Review Code 1- Verbalizes Understanding   Class Start Time 1400   Class Stop Time 1455   Class Time Calculation (min) 55 min    Row Name 01/03/23 1500     Education   Cardiac Education Topics Pritikin   Psychologist, forensic Exercise Education   Exercise Education Improving Performance   Instruction Review Code  1- Verbalizes Understanding   Class Start Time 1400   Class Stop Time 1455   Class Time Calculation (min) 55 min    Row Name 01/06/23 1500     Education   Cardiac Education Topics Pritikin   Glass blower/designer Nutrition   Nutrition Workshop Fueling a Forensic psychologist   Instruction Review Code 1- Tax inspector   Class Start Time 1400   Class Stop Time 1452   Class Time Calculation (min) 52 min    Row Name 01/08/23 1500     Education   Cardiac Education Topics Pritikin   Customer service manager   Weekly Topic Simple Sides and Sauces   Instruction Review Code 1- Verbalizes Understanding   Class Start Time 1400   Class Stop Time 1445   Class Time Calculation (min) 45 min    Row Name 01/10/23 1400     Education   Cardiac Education Topics Pritikin   Nurse, children's Exercise Physiologist   Select Psychosocial   Psychosocial How Our Thoughts Can Heal Our Hearts   Instruction Review Code 1- Verbalizes Understanding   Class Start Time 1400   Class Stop Time 1450   Class Time Calculation (min) 50 min    Row Name 01/13/23 1500     Workshops   Educator Exercise Physiologist   Select Exercise   Exercise Workshop Managing Heart Disease: Your Path to a Healthier Heart   Instruction Review Code 1- Verbalizes Understanding   Class Start Time 1415   Class Stop Time 1510   Class Time Calculation (min) 55 min    Row Name 01/15/23 1500  Education   Cardiac Education Topics Pritikin   Customer service manager   Weekly Topic Powerhouse Plant-Based Proteins   Instruction Review Code 1- Verbalizes Understanding   Class Start Time 1400   Class Stop Time 1440   Class Time Calculation (min) 40 min    Row Name 01/17/23 1400     Education   Cardiac Education Topics Pritikin   Engineering geologist General Education   General Education Hypertension and Heart Disease   Instruction Review Code 1- Verbalizes Understanding   Class Start Time 1410   Class Stop Time 1445   Class Time Calculation (min) 35 min    Row Name 01/20/23 1500     Education   Cardiac Education Topics Pritikin   Geographical information systems officer Psychosocial   Psychosocial Workshop From Head to Heart: The Power of a Healthy Outlook   Instruction Review Code 1- Verbalizes Understanding   Class Start Time 1400   Class Stop Time 1446   Class Time Calculation (min) 46 min    Row Name 01/27/23 1500     Education   Cardiac Education Topics Pritikin   Licensed conveyancer Nutrition   Nutrition Overview of the Pritikin Eating Plan   Instruction Review Code 1- Verbalizes Understanding   Class Start Time 1400   Class Stop Time 1452   Class Time Calculation (min) 52 min    Row Name 01/29/23 1500     Education   Cardiac Education Topics Pritikin   Customer service manager   Weekly Topic Fast and Healthy Breakfasts   Instruction Review Code 1- Verbalizes Understanding   Class Start Time 1400   Class Stop Time 1445   Class Time Calculation (min) 45 min    Row Name 02/05/23 1500     Education   Cardiac Education Topics Pritikin   Customer service manager   Weekly Topic Personalizing Your Pritikin Plate   Instruction Review Code 1- Verbalizes Understanding   Class Start Time 1400   Class Stop Time 1440   Class Time Calculation (min) 40 min    Row Name 02/07/23 1500     Education   Cardiac Education Topics Pritikin   Select Workshops     Workshops   Educator Exercise Physiologist   Select Exercise   Exercise Workshop Location manager and Fall Prevention   Instruction Review Code 1- Verbalizes  Understanding   Class Start Time 1405   Class Stop Time 1455   Class Time Calculation (min) 50 min    Row Name 02/10/23 1600     Education   Cardiac Education Topics Pritikin   Glass blower/designer Nutrition   Nutrition Workshop Label Reading   Instruction Review Code 1- Tax inspector   Class Start Time 1400   Class Stop Time 1445   Class Time Calculation (min) 45 min    Row Name 02/12/23 1600     Education   Cardiac Education Topics Pritikin   Customer service manager  Weekly Topic Delicious Desserts   Instruction Review Code 1- Verbalizes Understanding   Class Start Time 1400   Class Stop Time 1442   Class Time Calculation (min) 42 min    Row Name 02/14/23 1500     Education   Cardiac Education Topics Pritikin   Licensed conveyancer Nutrition   Nutrition Other  label reading   Instruction Review Code 1- Verbalizes Understanding   Class Start Time 1400   Class Stop Time 1500   Class Time Calculation (min) 60 min    Row Name 02/19/23 1500     Education   Cardiac Education Topics Pritikin   Secondary school teacher School   Educator Dietitian   Weekly Topic Tasty Appetizers and Snacks   Instruction Review Code 1- Verbalizes Understanding   Class Start Time 1355   Class Stop Time 1433   Class Time Calculation (min) 38 min    Row Name 02/21/23 1400     Education   Cardiac Education Topics Pritikin   Nurse, children's Exercise Physiologist   Select Nutrition   Nutrition Calorie Density   Instruction Review Code 1- Verbalizes Understanding   Class Start Time 1358   Class Stop Time 1432   Class Time Calculation (min) 34 min    Row Name 02/24/23 1500     Education   Cardiac Education Topics Pritikin   Select Workshops     Workshops   Educator Exercise Physiologist    Select Exercise   Exercise Workshop Exercise Basics: Building Your Action Plan   Instruction Review Code 1- Verbalizes Understanding   Class Start Time 1358   Class Stop Time 1449   Class Time Calculation (min) 51 min    Row Name 02/28/23 1400     Education   Cardiac Education Topics Pritikin   Psychologist, forensic Exercise Education   Exercise Education Move It!   Instruction Review Code 1- Verbalizes Understanding   Class Start Time 1356   Class Stop Time 1435   Class Time Calculation (min) 39 min    Row Name 03/05/23 1500     Education   Cardiac Education Topics Pritikin   Orthoptist   Educator Dietitian   Weekly Topic One-Pot Wonders   Instruction Review Code 1- Verbalizes Understanding   Class Start Time 1355   Class Stop Time 1433   Class Time Calculation (min) 38 min    Row Name 03/07/23 1500     Education   Cardiac Education Topics Pritikin   Writer General Education   General Education Hypertension and Heart Disease   Instruction Review Code 1- Verbalizes Understanding   Class Start Time 1400   Class Stop Time 1440   Class Time Calculation (min) 40 min    Row Name 03/12/23 1500     Education   Cardiac Education Topics Pritikin     Secondary school teacher   Weekly Topic Comforting Weekend Breakfasts   Instruction Review Code 1- Verbalizes Understanding   Class Start Time 1400   Class Stop Time 1445   Class Time Calculation (min) 45 min  Core Videos: Exercise    Move It!  Clinical staff conducted group or individual video education with verbal and written material and guidebook.  Patient learns the recommended Pritikin exercise program. Exercise with the goal of living a long, healthy life. Some of the health benefits of exercise include controlled diabetes, healthier blood  pressure levels, improved cholesterol levels, improved heart and lung capacity, improved sleep, and better body composition. Everyone should speak with their doctor before starting or changing an exercise routine.  Biomechanical Limitations Clinical staff conducted group or individual video education with verbal and written material and guidebook.  Patient learns how biomechanical limitations can impact exercise and how we can mitigate and possibly overcome limitations to have an impactful and balanced exercise routine.  Body Composition Clinical staff conducted group or individual video education with verbal and written material and guidebook.  Patient learns that body composition (ratio of muscle mass to fat mass) is a key component to assessing overall fitness, rather than body weight alone. Increased fat mass, especially visceral belly fat, can put [place] at increased risk for metabolic syndrome, type 2 diabetes, heart disease, and even death. It is recommended to combine diet and exercise (cardiovascular and resistance training) to improve your body composition. Seek guidance from your physician and exercise physiologist before implementing an exercise routine.  Exercise Action Plan Clinical staff conducted group or individual video education with verbal and written material and guidebook.  Patient learns the recommended strategies to achieve and enjoy long-term exercise adherence, including variety, self-motivation, self-efficacy, and positive decision making. Benefits of exercise include fitness, good health, weight management, more energy, better sleep, less stress, and overall well-being.  Medical   Heart Disease Risk Reduction Clinical staff conducted group or individual video education with verbal and written material and guidebook.  Patient learns our heart is our most vital organ as it circulates oxygen, nutrients, white blood cells, and hormones throughout the entire body, and carries  waste away. Data supports a plant-based eating plan like the Pritikin Program for its effectiveness in slowing progression of and reversing heart disease. The video provides a number of recommendations to address heart disease.   Metabolic Syndrome and Belly Fat  Clinical staff conducted group or individual video education with verbal and written material and guidebook.  Patient learns what metabolic syndrome is, how it leads to heart disease, and how one can reverse it and keep it from coming back. You have metabolic syndrome if you have 3 of the following 5 criteria: abdominal obesity, high blood pressure, high triglycerides, low HDL cholesterol, and high blood sugar.  Hypertension and Heart Disease Clinical staff conducted group or individual video education with verbal and written material and guidebook.  Patient learns that high blood pressure, or hypertension, is very common in the [place]. Hypertension is largely due to excessive salt intake, but other important risk factors include being overweight, physical inactivity, drinking too much alcohol, smoking, and not eating enough potassium from fruits and vegetables. High blood pressure is a leading risk factor for heart attack, stroke, congestive heart failure, dementia, kidney failure, and premature death. Long-term effects of excessive salt intake include stiffening of the arteries and thickening of heart muscle and organ damage. Recommendations include ways to reduce hypertension and the risk of heart disease.  Diseases of Our Time - Focusing on Diabetes Clinical staff conducted group or individual video education with verbal and written material and guidebook.  Patient learns why the best way to stop diseases of our time  is prevention, through food and other lifestyle changes. Medicine (such as prescription pills and surgeries) is often only a Band-Aid on the problem, not a long-term solution. Most common diseases of our time include  obesity, type 2 diabetes, hypertension, heart disease, and cancer. The Pritikin Program is recommended and has been proven to help reduce, reverse, and/or prevent the damaging effects of metabolic syndrome.  Nutrition   Overview of the Pritikin Eating Plan  Clinical staff conducted group or individual video education with verbal and written material and guidebook.  Patient learns about the Pritikin Eating Plan for disease risk reduction. The Pritikin Eating Plan emphasizes a wide variety of unrefined, minimally-processed carbohydrates, like fruits, vegetables, whole grains, and legumes. Go, Caution, and Stop food choices are explained. Plant-based and lean animal proteins are emphasized. Rationale provided for low sodium intake for blood pressure control, low added sugars for blood sugar stabilization, and low added fats and oils for coronary artery disease risk reduction and weight management.  Calorie Density  Clinical staff conducted group or individual video education with verbal and written material and guidebook.  Patient learns about calorie density and how it impacts the Pritikin Eating Plan. Knowing the characteristics of the food you choose will help you decide whether those foods will lead to weight gain or weight loss, and whether you want to consume more or less of them. Weight loss is usually a side effect of the Pritikin Eating Plan because of its focus on low calorie-dense foods.  Label Reading  Clinical staff conducted group or individual video education with verbal and written material and guidebook.  Patient learns about the Pritikin recommended label reading guidelines and corresponding recommendations regarding calorie density, added sugars, sodium content, and whole grains.  Dining Out - Part 1  Clinical staff conducted group or individual video education with verbal and written material and guidebook.  Patient learns that restaurant meals can be sabotaging because they can be  so high in calories, fat, sodium, and/or sugar. Patient learns recommended strategies on how to positively address this and avoid unhealthy pitfalls.  Facts on Fats  Clinical staff conducted group or individual video education with verbal and written material and guidebook.  Patient learns that lifestyle modifications can be just as effective, if not more so, as many medications for lowering your risk of heart disease. A Pritikin lifestyle can help to reduce your risk of inflammation and atherosclerosis (cholesterol build-up, or plaque, in the artery walls). Lifestyle interventions such as dietary choices and physical activity address the cause of atherosclerosis. A review of the types of fats and their impact on blood cholesterol levels, along with dietary recommendations to reduce fat intake is also included.  Nutrition Action Plan  Clinical staff conducted group or individual video education with verbal and written material and guidebook.  Patient learns how to incorporate Pritikin recommendations into their lifestyle. Recommendations include planning and keeping personal health goals in mind as an important part of their success.  Healthy Mind-Set    Healthy Minds, Bodies, Hearts  Clinical staff conducted group or individual video education with verbal and written material and guidebook.  Patient learns how to identify when they are stressed. Video will discuss the impact of that stress, as well as the many benefits of stress management. Patient will also be introduced to stress management techniques. The way we think, act, and feel has an impact on our hearts.  How Our Thoughts Can Heal Our Hearts  Clinical staff conducted group or individual video  education with verbal and written material and guidebook.  Patient learns that negative thoughts can cause depression and anxiety. This can result in negative lifestyle behavior and serious health problems. Cognitive behavioral therapy is an  effective method to help control our thoughts in order to change and improve our emotional outlook.  Additional Videos:  Exercise    Improving Performance  Clinical staff conducted group or individual video education with verbal and written material and guidebook.  Patient learns to use a non-linear approach by alternating intensity levels and lengths of time spent exercising to help burn more calories and lose more body fat. Cardiovascular exercise helps improve heart health, metabolism, hormonal balance, blood sugar control, and recovery from fatigue. Resistance training improves strength, endurance, balance, coordination, reaction time, metabolism, and muscle mass. Flexibility exercise improves circulation, posture, and balance. Seek guidance from your physician and exercise physiologist before implementing an exercise routine and learn your capabilities and proper form for all exercise.  Introduction to Yoga  Clinical staff conducted group or individual video education with verbal and written material and guidebook.  Patient learns about yoga, a discipline of the coming together of mind, breath, and body. The benefits of yoga include improved flexibility, improved range of motion, better posture and core strength, increased lung function, weight loss, and positive self-image. Yoga's heart health benefits include lowered blood pressure, healthier heart rate, decreased cholesterol and triglyceride levels, improved immune function, and reduced stress. Seek guidance from your physician and exercise physiologist before implementing an exercise routine and learn your capabilities and proper form for all exercise.  Medical   Aging: Enhancing Your Quality of Life  Clinical staff conducted group or individual video education with verbal and written material and guidebook.  Patient learns key strategies and recommendations to stay in good physical health and enhance quality of life, such as prevention  strategies, having an advocate, securing a Health Care Proxy and Power of Attorney, and keeping a list of medications and system for tracking them. It also discusses how to avoid risk for bone loss.  Biology of Weight Control  Clinical staff conducted group or individual video education with verbal and written material and guidebook.  Patient learns that weight gain occurs because we consume more calories than we burn (eating more, moving less). Even if your body weight is normal, you may have higher ratios of fat compared to muscle mass. Too much body fat puts you at increased risk for cardiovascular disease, heart attack, stroke, type 2 diabetes, and obesity-related cancers. In addition to exercise, following the Pritikin Eating Plan can help reduce your risk.  Decoding Lab Results  Clinical staff conducted group or individual video education with verbal and written material and guidebook.  Patient learns that lab test reflects one measurement whose values change over time and are influenced by many factors, including medication, stress, sleep, exercise, food, hydration, pre-existing medical conditions, and more. It is recommended to use the knowledge from this video to become more involved with your lab results and evaluate your numbers to speak with your doctor.   Diseases of Our Time - Overview  Clinical staff conducted group or individual video education with verbal and written material and guidebook.  Patient learns that according to the CDC, 50% to 70% of chronic diseases (such as obesity, type 2 diabetes, elevated lipids, hypertension, and heart disease) are avoidable through lifestyle improvements including healthier food choices, listening to satiety cues, and increased physical activity.  Sleep Disorders Clinical staff conducted group or individual  video education with verbal and written material and guidebook.  Patient learns how good quality and duration of sleep are important to  overall health and well-being. Patient also learns about sleep disorders and how they impact health along with recommendations to address them, including discussing with a physician.  Nutrition  Dining Out - Part 2 Clinical staff conducted group or individual video education with verbal and written material and guidebook.  Patient learns how to plan ahead and communicate in order to maximize their dining experience in a healthy and nutritious manner. Included are recommended food choices based on the type of restaurant the patient is visiting.   Fueling a Banker conducted group or individual video education with verbal and written material and guidebook.  There is a strong connection between our food choices and our health. Diseases like obesity and type 2 diabetes are very prevalent and are in large-part due to lifestyle choices. The Pritikin Eating Plan provides plenty of food and hunger-curbing satisfaction. It is easy to follow, affordable, and helps reduce health risks.  Menu Workshop  Clinical staff conducted group or individual video education with verbal and written material and guidebook.  Patient learns that restaurant meals can sabotage health goals because they are often packed with calories, fat, sodium, and sugar. Recommendations include strategies to plan ahead and to communicate with the manager, chef, or server to help order a healthier meal.  Planning Your Eating Strategy  Clinical staff conducted group or individual video education with verbal and written material and guidebook.  Patient learns about the Pritikin Eating Plan and its benefit of reducing the risk of disease. The Pritikin Eating Plan does not focus on calories. Instead, it emphasizes high-quality, nutrient-rich foods. By knowing the characteristics of the foods, we choose, we can determine their calorie density and make informed decisions.  Targeting Your Nutrition Priorities  Clinical staff  conducted group or individual video education with verbal and written material and guidebook.  Patient learns that lifestyle habits have a tremendous impact on disease risk and progression. This video provides eating and physical activity recommendations based on your personal health goals, such as reducing LDL cholesterol, losing weight, preventing or controlling type 2 diabetes, and reducing high blood pressure.  Vitamins and Minerals  Clinical staff conducted group or individual video education with verbal and written material and guidebook.  Patient learns different ways to obtain key vitamins and minerals, including through a recommended healthy diet. It is important to discuss all supplements you take with your doctor.   Healthy Mind-Set    Smoking Cessation  Clinical staff conducted group or individual video education with verbal and written material and guidebook.  Patient learns that cigarette smoking and tobacco addiction pose a serious health risk which affects millions of people. Stopping smoking will significantly reduce the risk of heart disease, lung disease, and many forms of cancer. Recommended strategies for quitting are covered, including working with your doctor to develop a successful plan.  Culinary   Becoming a Set designer conducted group or individual video education with verbal and written material and guidebook.  Patient learns that cooking at home can be healthy, cost-effective, quick, and puts them in control. Keys to cooking healthy recipes will include looking at your recipe, assessing your equipment needs, planning ahead, making it simple, choosing cost-effective seasonal ingredients, and limiting the use of added fats, salts, and sugars.  Cooking - Breakfast and Snacks  Clinical staff conducted group or individual  video education with verbal and written material and guidebook.  Patient learns how important breakfast is to satiety and nutrition  through the entire day. Recommendations include key foods to eat during breakfast to help stabilize blood sugar levels and to prevent overeating at meals later in the day. Planning ahead is also a key component.  Cooking - Educational psychologist conducted group or individual video education with verbal and written material and guidebook.  Patient learns eating strategies to improve overall health, including an approach to cook more at home. Recommendations include thinking of animal protein as a side on your plate rather than center stage and focusing instead on lower calorie dense options like vegetables, fruits, whole grains, and plant-based proteins, such as beans. Making sauces in large quantities to freeze for later and leaving the skin on your vegetables are also recommended to maximize your experience.  Cooking - Healthy Salads and Dressing Clinical staff conducted group or individual video education with verbal and written material and guidebook.  Patient learns that vegetables, fruits, whole grains, and legumes are the foundations of the Pritikin Eating Plan. Recommendations include how to incorporate each of these in flavorful and healthy salads, and how to create homemade salad dressings. Proper handling of ingredients is also covered. Cooking - Soups and [employer] - Soups and Desserts Clinical staff conducted group or individual video education with verbal and written material and guidebook.  Patient learns that Pritikin soups and desserts make for easy, nutritious, and delicious snacks and meal components that are low in sodium, fat, sugar, and calorie density, while high in vitamins, minerals, and filling fiber. Recommendations include simple and healthy ideas for soups and desserts.   Overview     The Pritikin Solution Program Overview Clinical staff conducted group or individual video education with verbal and written material and guidebook.  Patient learns that the  results of the Pritikin Program have been documented in more than 100 articles published in peer-reviewed journals, and the benefits include reducing risk factors for (and, in some cases, even reversing) high cholesterol, high blood pressure, type 2 diabetes, obesity, and more! An overview of the three key pillars of the Pritikin Program will be covered: eating well, doing regular exercise, and having a healthy mind-set.  WORKSHOPS  Exercise: Exercise Basics: Building Your Action Plan Clinical staff led group instruction and group discussion with PowerPoint presentation and patient guidebook. To enhance the learning environment the use of posters, models and videos may be added. At the conclusion of this workshop, patients will comprehend the difference between physical activity and exercise, as well as the benefits of incorporating both, into their routine. Patients will understand the FITT (Frequency, Intensity, Time, and Type) principle and how to use it to build an exercise action plan. In addition, safety concerns and other considerations for exercise and cardiac rehab will be addressed by the presenter. The purpose of this lesson is to promote a comprehensive and effective weekly exercise routine in order to improve patients' overall level of fitness.   Managing Heart Disease: Your Path to a Healthier Heart Clinical staff led group instruction and group discussion with PowerPoint presentation and patient guidebook. To enhance the learning environment the use of posters, models and videos may be added.At the conclusion of this workshop, patients will understand the anatomy and physiology of the heart. Additionally, they will understand how Pritikin's three pillars impact the risk factors, the progression, and the management of heart disease.  The purpose of  this lesson is to provide a high-level overview of the heart, heart disease, and how the Pritikin lifestyle positively impacts risk  factors.  Exercise Biomechanics Clinical staff led group instruction and group discussion with PowerPoint presentation and patient guidebook. To enhance the learning environment the use of posters, models and videos may be added. Patients will learn how the structural parts of their bodies function and how these functions impact their daily activities, movement, and exercise. Patients will learn how to promote a neutral spine, learn how to manage pain, and identify ways to improve their physical movement in order to promote healthy living. The purpose of this lesson is to expose patients to common physical limitations that impact physical activity. Participants will learn practical ways to adapt and manage aches and pains, and to minimize their effect on regular exercise. Patients will learn how to maintain good posture while sitting, walking, and lifting.  Balance Training and Fall Prevention  Clinical staff led group instruction and group discussion with PowerPoint presentation and patient guidebook. To enhance the learning environment the use of posters, models and videos may be added. At the conclusion of this workshop, patients will understand the importance of their sensorimotor skills (vision, proprioception, and the vestibular system) in maintaining their ability to balance as they age. Patients will apply a variety of balancing exercises that are appropriate for their current level of function. Patients will understand the common causes for poor balance, possible solutions to these problems, and ways to modify their physical environment in order to minimize their fall risk. The purpose of this lesson is to teach patients about the importance of maintaining balance as they age and ways to minimize their risk of falling.  WORKSHOPS   Nutrition:  Fueling a Ship broker led group instruction and group discussion with PowerPoint presentation and patient guidebook. To enhance  the learning environment the use of posters, models and videos may be added. Patients will review the foundational principles of the Pritikin Eating Plan and understand what constitutes a serving size in each of the food groups. Patients will also learn Pritikin-friendly foods that are better choices when away from home and review make-ahead meal and snack options. Calorie density will be reviewed and applied to three nutrition priorities: weight maintenance, weight loss, and weight gain. The purpose of this lesson is to reinforce (in a group setting) the key concepts around what patients are recommended to eat and how to apply these guidelines when away from home by planning and selecting Pritikin-friendly options. Patients will understand how calorie density may be adjusted for different weight management goals.  Mindful Eating  Clinical staff led group instruction and group discussion with PowerPoint presentation and patient guidebook. To enhance the learning environment the use of posters, models and videos may be added. Patients will briefly review the concepts of the Pritikin Eating Plan and the importance of low-calorie dense foods. The concept of mindful eating will be introduced as well as the importance of paying attention to internal hunger signals. Triggers for non-hunger eating and techniques for dealing with triggers will be explored. The purpose of this lesson is to provide patients with the opportunity to review the basic principles of the Pritikin Eating Plan, discuss the value of eating mindfully and how to measure internal cues of hunger and fullness using the Hunger Scale. Patients will also discuss reasons for non-hunger eating and learn strategies to use for controlling emotional eating.  Targeting Your Nutrition Priorities Clinical staff led group instruction  and group discussion with PowerPoint presentation and patient guidebook. To enhance the learning environment the use of posters,  models and videos may be added. Patients will learn how to determine their genetic susceptibility to disease by reviewing their family history. Patients will gain insight into the importance of diet as part of an overall healthy lifestyle in mitigating the impact of genetics and other environmental insults. The purpose of this lesson is to provide patients with the opportunity to assess their personal nutrition priorities by looking at their family history, their own health history and current risk factors. Patients will also be able to discuss ways of prioritizing and modifying the Pritikin Eating Plan for their highest risk areas  Menu  Clinical staff led group instruction and group discussion with PowerPoint presentation and patient guidebook. To enhance the learning environment the use of posters, models and videos may be added. Using menus brought in from [employer], or printed from [employer], patients will apply the Pritikin dining out guidelines that were presented in the [employer] video. Patients will also be able to practice these guidelines in a variety of provided scenarios. The purpose of this lesson is to provide patients with the opportunity to practice hands-on learning of the Pritikin Dining Out guidelines with actual menus and practice scenarios.  Label Reading Clinical staff led group instruction and group discussion with PowerPoint presentation and patient guidebook. To enhance the learning environment the use of posters, models and videos may be added. Patients will review and discuss the Pritikin label reading guidelines presented in Pritikin's Label Reading Educational series video. Using fool labels brought in from local grocery stores and markets, patients will apply the label reading guidelines and determine if the packaged food meet the Pritikin guidelines. The purpose of this lesson is to provide patients with the opportunity to review, discuss, and  practice hands-on learning of the Pritikin Label Reading guidelines with actual packaged food labels. Cooking School  Pritikin's [employer] are designed to teach patients ways to prepare quick, simple, and affordable recipes at home. The importance of nutrition's role in chronic disease risk reduction is reflected in its emphasis in the overall Pritikin program. By learning how to prepare essential core Pritikin Eating Plan recipes, patients will increase control over what they eat; be able to customize the flavor of foods without the use of added salt, sugar, or fat; and improve the quality of the food they consume. By learning a set of core recipes which are easily assembled, quickly prepared, and affordable, patients are more likely to prepare more healthy foods at home. These workshops focus on convenient breakfasts, simple entres, side dishes, and desserts which can be prepared with minimal effort and are consistent with nutrition recommendations for cardiovascular risk reduction. Cooking [employer] are taught by a Armed forces logistics/support/administrative officer (RD) who has been trained by the [employer]. The chef or RD has a clear understanding of the importance of minimizing - if not completely eliminating - added fat, sugar, and sodium in recipes. Throughout the series of Cooking School Workshop sessions, patients will learn about healthy ingredients and efficient methods of cooking to build confidence in their capability to prepare    Cooking School weekly topics:  Adding Flavor- Sodium-Free  Fast and Healthy Breakfasts  Powerhouse Plant-Based Proteins  Satisfying Salads and Dressings  Simple Sides and Sauces  International Cuisine-Spotlight on the [employer] Zones  Delicious Desserts  Savory Soups  [employer] - Meals in  a Snap  Tasty Appetizers and Snacks  Comforting Weekend Breakfasts  One-Pot Wonders   Fast Evening Meals  Landscape architect Your  Pritikin Plate  WORKSHOPS   Healthy Mindset (Psychosocial):  Focused Goals, Sustainable Changes Clinical staff led group instruction and group discussion with PowerPoint presentation and patient guidebook. To enhance the learning environment the use of posters, models and videos may be added. Patients will be able to apply effective goal setting strategies to establish at least one personal goal, and then take consistent, meaningful action toward that goal. They will learn to identify common barriers to achieving personal goals and develop strategies to overcome them. Patients will also gain an understanding of how our mind-set can impact our ability to achieve goals and the importance of cultivating a positive and growth-oriented mind-set. The purpose of this lesson is to provide patients with a deeper understanding of how to set and achieve personal goals, as well as the tools and strategies needed to overcome common obstacles which may arise along the way.  From Head to Heart: The Power of a Healthy Outlook  Clinical staff led group instruction and group discussion with PowerPoint presentation and patient guidebook. To enhance the learning environment the use of posters, models and videos may be added. Patients will be able to recognize and describe the impact of emotions and mood on physical health. They will discover the importance of self-care and explore self-care practices which may work for them. Patients will also learn how to utilize the 4 C's to cultivate a healthier outlook and better manage stress and challenges. The purpose of this lesson is to demonstrate to patients how a healthy outlook is an essential part of maintaining good health, especially as they continue their cardiac rehab journey.  Healthy Sleep for a Healthy Heart Clinical staff led group instruction and group discussion with PowerPoint presentation and patient guidebook. To enhance the learning environment the use of  posters, models and videos may be added. At the conclusion of this workshop, patients will be able to demonstrate knowledge of the importance of sleep to overall health, well-being, and quality of life. They will understand the symptoms of, and treatments for, common sleep disorders. Patients will also be able to identify daytime and nighttime behaviors which impact sleep, and they will be able to apply these tools to help manage sleep-related challenges. The purpose of this lesson is to provide patients with a general overview of sleep and outline the importance of quality sleep. Patients will learn about a few of the most common sleep disorders. Patients will also be introduced to the concept of "sleep hygiene," and discover ways to self-manage certain sleeping problems through simple daily behavior changes. Finally, the workshop will motivate patients by clarifying the links between quality sleep and their goals of heart-healthy living.   Recognizing and Reducing Stress Clinical staff led group instruction and group discussion with PowerPoint presentation and patient guidebook. To enhance the learning environment the use of posters, models and videos may be added. At the conclusion of this workshop, patients will be able to understand the types of stress reactions, differentiate between acute and chronic stress, and recognize the impact that chronic stress has on their health. They will also be able to apply different coping mechanisms, such as reframing negative self-talk. Patients will have the opportunity to practice a variety of stress management techniques, such as deep abdominal breathing, progressive muscle relaxation, and/or guided imagery.  The purpose of this lesson is to educate  patients on the role of stress in their lives and to provide healthy techniques for coping with it.  Learning Barriers/Preferences:  Learning Barriers/Preferences - 12/24/22 1351       Learning Barriers/Preferences    Learning Barriers Sight;Hearing    Learning Preferences Audio;Computer/Internet;Group Instruction;Individual Instruction;Pictoral;Skilled Demonstration;Verbal Instruction;Video;Written Material             Education Topics:  Knowledge Questionnaire Score:  Knowledge Questionnaire Score - 03/19/23 1630       Knowledge Questionnaire Score   Post Score 19/24             Core Components/Risk Factors/Patient Goals at Admission:  Personal Goals and Risk Factors at Admission - 12/24/22 1353       Core Components/Risk Factors/Patient Goals on Admission   Improve shortness of breath with ADL's Yes    Intervention Provide education, individualized exercise plan and daily activity instruction to help decrease symptoms of SOB with activities of daily living.    Expected Outcomes Short Term: Improve cardiorespiratory fitness to achieve a reduction of symptoms when performing ADLs;Long Term: Be able to perform more ADLs without symptoms or delay the onset of symptoms    Lipids Yes    Intervention Provide education and support for participant on nutrition & aerobic/resistive exercise along with prescribed medications to achieve LDL 70mg , HDL >40mg .    Expected Outcomes Short Term: Participant states understanding of desired cholesterol values and is compliant with medications prescribed. Participant is following exercise prescription and nutrition guidelines.;Long Term: Cholesterol controlled with medications as prescribed, with individualized exercise RX and with personalized nutrition plan. Value goals: LDL < 70mg , HDL > 40 mg.             Core Components/Risk Factors/Patient Goals Review:   Goals and Risk Factor Review     Row Name 12/30/22 1724 01/28/23 1339 02/26/23 1433 03/26/23 0734       Core Components/Risk Factors/Patient Goals Review   Personal Goals Review Weight Management/Obesity;Improve shortness of breath with ADL's;Lipids Weight Management/Obesity;Improve shortness  of breath with ADL's;Lipids Weight Management/Obesity;Improve shortness of breath with ADL's;Lipids Weight Management/Obesity;Improve shortness of breath with ADL's;Lipids    Review Kelten started cardiac rehab on 12/30/22 and did well with exercise. Vital signs were stable. Oxygen saturations ranged from 90-96% on room air Weylin is doing  well with exercise at cardiac rehab. . Vital signs have been stable. Markeise rides his electric bike with a group of friends daily. Jacarion has increased his workloads and says cardiac rehab has been helpful for him. Amjad is doing  well with exercise at cardiac rehab. . Vital signs have been stable. Jarious has been doing well with exercise at cardiac rehab. . Vital signs have been stable. Boss's last day of exercise at cardiac rehab was on 03/12/23. Luisdavid was supposed to complete cardiac rehab on 03/19/23.    Expected Outcomes Lamell will continue to participate in cardiac rehab for exercise, nutrition and lifestyle modifications Taitum will continue to participate in cardiac rehab for exercise, nutrition and lifestyle modifications Pate will continue to participate in cardiac rehab for exercise, nutrition and lifestyle modifications Pleasant will continue to participate in cardiac rehab for exercise, nutrition and lifestyle modifications             Core Components/Risk Factors/Patient Goals at Discharge (Final Review):   Goals and Risk Factor Review - 03/26/23 0734       Core Components/Risk Factors/Patient Goals Review   Personal Goals Review Weight Management/Obesity;Improve shortness of breath with ADL's;Lipids  Review Melik has been doing well with exercise at cardiac rehab. . Vital signs have been stable. Hermon's last day of exercise at cardiac rehab was on 03/12/23. Ashely was supposed to complete cardiac rehab on 03/19/23.    Expected Outcomes Saksham will continue to participate in cardiac rehab for exercise, nutrition and lifestyle modifications             ITP  Comments:  ITP Comments     Row Name 12/24/22 1025 12/30/22 1722 01/28/23 1135 01/28/23 1337 03/26/23 0729   ITP Comments Armanda Magic, MD:  Medical Director.  Introduction to the Pritikin education program/Intensive cardiac rehab.  Initial oreintation packet reviewed with the patient. 30 day ITP Review. Delane started cardiac rehab on 12/30/22 and did well with exercise. 30 day ITP Review. Perez started cardiac rehab on 12/30/22 and did well with exercise. 30 day ITP Review. Nicholis has good attendance and participation in cardiac rehab. Delta is doing well with exercise 30 day ITP Review. Gerrard has good particpation in cardiac rehab when in. Recent attendance has been sporadic. last attended exercise session was on 03/12/23            Comments: See ITP comments.Thayer Headings RN BSN

## 2023-03-26 NOTE — Telephone Encounter (Signed)
Spoke with Nghia has has been out due to COPD exacerbation. Gilliam had his inhalers changed. Voyd plans to return to exercise on Friday.Thayer Headings RN BSN

## 2023-03-28 ENCOUNTER — Encounter (HOSPITAL_COMMUNITY)
Admission: RE | Admit: 2023-03-28 | Discharge: 2023-03-28 | Disposition: A | Discharge status: Home / Self Care | Payer: BC Managed Care – PPO | Source: Ambulatory Visit | Attending: Cardiology | Admitting: Cardiology

## 2023-03-28 DIAGNOSIS — Z952 Presence of prosthetic heart valve: Secondary | ICD-10-CM | POA: Diagnosis not present

## 2023-03-31 ENCOUNTER — Encounter (HOSPITAL_COMMUNITY)
Admission: RE | Admit: 2023-03-31 | Discharge: 2023-03-31 | Disposition: A | Discharge status: Home / Self Care | Payer: BC Managed Care – PPO | Source: Ambulatory Visit | Attending: Cardiology | Admitting: Cardiology

## 2023-03-31 DIAGNOSIS — Z952 Presence of prosthetic heart valve: Secondary | ICD-10-CM | POA: Diagnosis not present

## 2023-04-01 NOTE — Progress Notes (Unsigned)
Cardiology Office Note:    Date:  04/02/2023   ID:  DABID LANSDALE, DOB 01-28-1961, MRN [id number]  PCP:  Corwin Levins, MD  Cardiologist:  Little Ishikawa, MD  Electrophysiologist:  Regan Lemming, MD   Referring MD: Corwin Levins, MD   Chief Complaint  Patient presents with   s/p AVR    History of Present Illness:    Christopher Arias is a 62 y.o. male with a hx of COPD on home 3L, hypothyroidism, hyperlipidemia, OSA, rectus sheath hematoma who presents for follow-up.  He was referred for evaluation of possible atrial flutter, initially seen 07/15/2022.  EKG 06/21/2022 read as atrial flutter but appears sinus rhythm.  Echocardiogram 07/11/2022 showed EF 60 to 65%, grade 1 diastolic dysfunction, normal RV function, mild to moderate AI, possible small echodensity on aortic valve on subcostal images.  He reports he has been having shortness of breath since his hospitalization in December 2023 with acute hypoxic respiratory failure in setting of RSV infection.  Has been slowly improving.  Denies any chest pain, lightheadedness, syncope, lower extremity edema, or palpitations.  Smoked 43 years, about 0.5ppd, quit age 20.  Father died of MI at 80.   TEE on 08/04/2022 showed EF 60 to 65%, mild LV dilatation, normal RV function, mild left atrial enlargement, bicuspid aortic valve with severe aortic regurgitation, dilated aortic root measuring 40 mm.  LHC/RHC on 08/20/2022 showed no significant CAD, normal filling pressures.  Referred to cardiothoracic surgery, underwent aortic valve replacement with 27 mm Edwards Inspiris Resilia valve with Dr. Laneta Simmers on 11/04/2022.  Developed complete heart block after procedure, underwent PPM placement 11/12/2022.  Also had atrial tachycardia, started on digoxin.  Echocardiogram 12/16/2022 showed EF 55 to 60%, grade 1 diastolic dysfunction, mildly reduced RV function, normal functioning bioprosthetic aortic valve, mild dilatation of aortic root measuring 42 mm.  Since  last clinic visit, he reports he is doing well.  Denies any chest pain,  lightheadedness, syncope, lower extremity edema, or palpitations.  He is doing cardiac rehab.  Does report some shortness of breath at night but not while he exercises.  He is riding bike 10 miles per day.  Reports his epistaxis has resolved.  Past Medical History:  Diagnosis Date   ANTERIOR PITUITARY HYPERFUNCTION 11/23/2009   ANXIETY 11/24/2007   COPD (chronic obstructive pulmonary disease) (HCC) 05/31/2021   GERD 11/24/2007   GYNECOMASTIA 11/10/2009   HYPERLIPIDEMIA 11/24/2007   HYPOTHYROIDISM, POST-RADIATION 03/16/2010   PONV (postoperative nausea and vomiting)    Severe aortic insufficiency     Past Surgical History:  Procedure Laterality Date   AORTIC VALVE REPLACEMENT N/A 11/04/2022   Procedure: AORTIC VALVE REPLACEMENT (AVR) USING INSPIRIS RESILIA 27 MM AORTIC VALVE;  Surgeon: Alleen Borne, MD;  Location: [facility] OR;  Service: Open Heart Surgery;  Laterality: N/A;  Median sternotomy   EXPLORATION POST OPERATIVE OPEN HEART N/A 11/04/2022   Procedure: EXPLORATION POST OPERATIVE OPEN HEART;  Surgeon: Alleen Borne, MD;  Location: [facility] OR;  Service: Open Heart Surgery;  Laterality: N/A;   HEMORRHOID SURGERY     INGUINAL HERNIA REPAIR Bilateral    PACEMAKER IMPLANT N/A 11/12/2022   Procedure: PACEMAKER IMPLANT;  Surgeon: Regan Lemming, MD;  Location: [facility] INVASIVE CV LAB;  Service: Cardiovascular;  Laterality: N/A;   RIGHT/LEFT HEART CATH AND CORONARY ANGIOGRAPHY N/A 08/20/2022   Procedure: RIGHT/LEFT HEART CATH AND CORONARY ANGIOGRAPHY;  Surgeon: Marykay Lex, MD;  Location: [facility] INVASIVE CV LAB;  Service:  Cardiovascular;  Laterality: N/A;   TEE WITHOUT CARDIOVERSION N/A 08/01/2022   Procedure: TRANSESOPHAGEAL ECHOCARDIOGRAM (TEE);  Surgeon: Wendall Stade, MD;  Location: [facility] ENDOSCOPY;  Service: Cardiovascular;  Laterality: N/A;   TEE WITHOUT CARDIOVERSION N/A 11/04/2022   Procedure: TRANSESOPHAGEAL  ECHOCARDIOGRAM;  Surgeon: Alleen Borne, MD;  Location: [facility] OR;  Service: Open Heart Surgery;  Laterality: N/A;    Current Medications: Current Meds  Medication Sig   albuterol (VENTOLIN HFA) 108 (90 Base) MCG/ACT inhaler Inhale 2 puffs into the lungs every 6 (six) hours as needed for wheezing or shortness of breath.   aspirin EC 81 MG tablet Take 1 tablet (81 mg total) by mouth daily. Swallow whole.   atorvastatin (LIPITOR) 20 MG tablet Take 1 tablet (20 mg total) by mouth daily.   Budeson-Glycopyrrol-Formoterol (BREZTRI AEROSPHERE) 160-9-4.8 MCG/ACT AERO Inhale 2 puffs into the lungs in the morning and at bedtime.   clonazePAM (KLONOPIN) 1 MG tablet Take 1 tablet (1 mg total) by mouth 2 (two) times daily as needed.   diphenhydramine-acetaminophen (TYLENOL PM) 25-500 MG TABS tablet Take 2 tablets by mouth at bedtime.   levothyroxine (SYNTHROID) 100 MCG tablet Take 1 tablet (100 mcg total) by mouth daily.   metoprolol succinate (TOPROL-XL) 50 MG 24 hr tablet Take 1 tablet (50 mg total) by mouth daily. Take with or immediately following a meal.   ondansetron (ZOFRAN-ODT) 4 MG disintegrating tablet Take 1 tablet (4 mg total) by mouth every 8 (eight) hours as needed for nausea or vomiting.   OXYGEN Inhale 2-3 L into the lungs at bedtime. As needed during the day   sertraline (ZOLOFT) 100 MG tablet Take 2 tablets (200 mg total) by mouth daily.   tamsulosin (FLOMAX) 0.4 MG CAPS capsule Take 1 capsule (0.4 mg total) by mouth daily.   varenicline (CHANTIX) 1 MG tablet TAKE 1 TABLET BY MOUTH TWICE A DAY     Allergies:   Atenolol   Social History   Socioeconomic History   Marital status: Married    Spouse name: Not on file   Number of children: 0   Years of education: Not on file   Highest education level: Not on file  Occupational History    Employer: [employer]  Tobacco Use   Smoking status: Former    Current packs/day: 0.00    Average packs/day: 1 pack/day for 20.0 years (20.0 ttl pk-yrs)     Types: Cigarettes    Start date: 08/24/2001    Quit date: 08/24/2021    Years since quitting: 1.6   Smokeless tobacco: Never  Vaping Use   Vaping status: Never Used  Substance and Sexual Activity   Alcohol use: Yes    Alcohol/week: 1.0 standard drink of alcohol    Types: 1 Cans of beer per week    Comment: one beer every 2 weeks, maybe 1 mixed drink once a month   Drug use: No   Sexual activity: Not on file  Other Topics Concern   Not on file  Social History Narrative   Not on file   Social Determinants of Health   Financial Resource Strain: Low Risk  (03/07/2023)   Overall Financial Resource Strain (CARDIA)    Difficulty of Paying Living Expenses: Not hard at all  Food Insecurity: No Food Insecurity (03/07/2023)   Hunger Vital Sign    Worried About Running Out of Food in the Last Year: Never true    Ran Out of Food in the Last Year: Never true  Transportation Needs: No Transportation Needs (03/07/2023)   PRAPARE - Administrator, Civil Service (Medical): No    Lack of Transportation (Non-Medical): No  Physical Activity: Sufficiently Active (03/07/2023)   Exercise Vital Sign    Days of Exercise per Week: 3 days    Minutes of Exercise per Session: 60 min  Stress: Stress Concern Present (03/07/2023)   [employer] of Occupational Health - Occupational Stress Questionnaire    Feeling of Stress : To some extent  Social Connections: Socially Integrated (03/07/2023)   Social Connection and Isolation Panel [NHANES]    Frequency of Communication with Friends and Family: More than three times a week    Frequency of Social Gatherings with Friends and Family: Three times a week    Attends Religious Services: More than 4 times per year    Active Member of Clubs or Organizations: Yes    Attends Engineer, structural: More than 4 times per year    Marital Status: Married     Family History: The patient's family history includes COPD in an other family member;  Heart disease in his father and another family member; Stroke in an other family member. There is no history of Colon cancer.  ROS:   Please see the history of present illness.     All other systems reviewed and are negative.  EKGs/Labs/Other Studies Reviewed:    The following studies were reviewed today:   EKG:   07/15/2022: Sinus tachycardia, rate 103, right axis deviation, poor R wave progression 08/13/22: Sinus tachycardia, rate 102, first-degree AV block, right axis deviation, Q waves in V1/2, poor R wave progression 11/27/2022: Sinus tachycardia, frequent PVCs, right axis deviation, incomplete right bundle branch block  Recent Labs: 05/10/2022: B Natriuretic Peptide 63.9 11/05/2022: Magnesium 2.0 11/27/2022: ALT 22; BUN 16; Creatinine, Ser 0.70; Hemoglobin 9.8; Platelets 271; Potassium 4.1; Sodium 139; TSH 8.380  Recent Lipid Panel    Component Value Date/Time   CHOL 156 05/21/2022 1628   TRIG 89.0 05/21/2022 1628   HDL 60.50 05/21/2022 1628   CHOLHDL 3 05/21/2022 1628   VLDL 17.8 05/21/2022 1628   LDLCALC 77 05/21/2022 1628    Physical Exam:    VS:  BP 104/72 (BP Location: Left Arm, Patient Position: Sitting, Cuff Size: Normal)   Pulse 78   Ht 5\' 5"  (1.651 m)   Wt 171 lb (77.6 kg)   SpO2 92%   BMI 28.46 kg/m     Wt Readings from Last 3 Encounters:  04/02/23 171 lb (77.6 kg)  02/17/23 160 lb (72.6 kg)  12/24/22 166 lb 7.2 oz (75.5 kg)     GEN:  Well nourished, well developed in no acute distress HEENT: Normal NECK: No JVD; No carotid bruits CARDIAC: RRR, no murmurs, rubs, gallops RESPIRATORY:  Clear to auscultation without rales, wheezing or rhonchi  ABDOMEN: Soft, non-tender, non-distended MUSCULOSKELETAL:  No edema; No deformity  SKIN: Warm and dry NEUROLOGIC:  Alert and oriented x 3 PSYCHIATRIC:  Normal affect   ASSESSMENT:    1. H/O aortic valve replacement   2. Anemia, unspecified type   3. Pacemaker   4. Hyperlipidemia, unspecified hyperlipidemia  type       PLAN:    Aortic regurgitation s/p AVR: Echocardiogram 07/11/2022 showed EF 60 to 65%, grade 1 diastolic dysfunction, normal RV function, mild to moderate AI, possible small echodensity on aortic valve.  Blood cultures were checked and were negative.  TEE on 08/01/2022 showed EF 60 to 65%, mild  LV dilatation, normal RV function, mild left atrial enlargement, bicuspid aortic valve with severe aortic regurgitation, dilated aortic root measuring 40 mm.  LHC/RHC on 08/20/2022 showed no significant CAD, normal filling pressures.  Referred to cardiothoracic surgery, underwent aortic valve replacement with 27 mm Edwards Inspiris Resilia valve with Dr. Laneta Simmers on 11/04/2022.   Echocardiogram 12/16/2022 showed EF 55 to 60%, grade 1 diastolic dysfunction, mildly reduced RV function, normal functioning bioprosthetic aortic valve, mild dilatation of aortic root measuring 42 mm.  Complete heart block: Developed after AVR 11/2022, status post PPM -Follows with EP for device management  Atrial tachycardia: Noted after surgery, was started on digoxin and Toprol-XL.  Follows with EP, digoxin was discontinued.  Recent device interrogation showed minimal atrial tachycardia.  Continue Toprol-XL  Hyperlipidemia: On atorvastatin 20 mg daily.  LDL 77 on 05/21/2022  Anemia: Most recent hemoglobin 9.8, likely due to blood loss following AVR.  Also was having issues with epistaxis but reports has resolved.  Will check CBC  RTC in 6 months  Medication Adjustments/Labs and Tests Ordered: Current medicines are reviewed at length with the patient today.  Concerns regarding medicines are outlined above.  Orders Placed This Encounter  Procedures   Basic metabolic panel   CBC   No orders of the defined types were placed in this encounter.   Patient Instructions  Medication Instructions:  Your physician recommends that you continue on your current medications as directed. Please refer to the Current Medication list  given to you today.  *If you need a refill on your cardiac medications before your next appointment, please call your pharmacy*   Lab Work: Your physician recommends that you have labs drawn today: BMET & CBC  If you have labs (blood work) drawn today and your tests are completely normal, you will receive your results only by: MyChart Message (if you have MyChart) OR A paper copy in the mail If you have any lab test that is abnormal or we need to change your treatment, we will call you to review the results.    Follow-Up: At [facility], you and your health needs are our priority.  As part of our continuing mission to provide you with exceptional heart care, we have created designated Provider Care Teams.  These Care Teams include your primary Cardiologist (physician) and Advanced Practice Providers (APPs -  Physician Assistants and Nurse Practitioners) who all work together to provide you with the care you need, when you need it.  We recommend signing up for the patient portal called "MyChart".  Sign up information is provided on this After Visit Summary.  MyChart is used to connect with patients for Virtual Visits (Telemedicine).  Patients are able to view lab/test results, encounter notes, upcoming appointments, etc.  Non-urgent messages can be sent to your provider as well.   To learn more about what you can do with MyChart, go to [email or web address].    Your next appointment:   6 month(s)  Provider:   Little Ishikawa, MD      Signed, Little Ishikawa, MD  04/02/2023 5:31 PM    [facility]

## 2023-04-02 ENCOUNTER — Ambulatory Visit: Payer: BC Managed Care – PPO | Attending: Cardiology | Admitting: Cardiology

## 2023-04-02 ENCOUNTER — Encounter (HOSPITAL_COMMUNITY): Payer: BC Managed Care – PPO

## 2023-04-02 ENCOUNTER — Encounter: Payer: Self-pay | Admitting: Cardiology

## 2023-04-02 VITALS — BP 104/72 | HR 78 | Ht 65.0 in | Wt 171.0 lb

## 2023-04-02 DIAGNOSIS — Z950 Presence of cardiac pacemaker: Secondary | ICD-10-CM | POA: Diagnosis not present

## 2023-04-02 DIAGNOSIS — D649 Anemia, unspecified: Secondary | ICD-10-CM | POA: Diagnosis not present

## 2023-04-02 DIAGNOSIS — Z952 Presence of prosthetic heart valve: Secondary | ICD-10-CM

## 2023-04-02 DIAGNOSIS — E785 Hyperlipidemia, unspecified: Secondary | ICD-10-CM

## 2023-04-02 NOTE — Patient Instructions (Signed)
Medication Instructions:  Your physician recommends that you continue on your current medications as directed. Please refer to the Current Medication list given to you today.  *If you need a refill on your cardiac medications before your next appointment, please call your pharmacy*   Lab Work: Your physician recommends that you have labs drawn today: BMET & CBC  If you have labs (blood work) drawn today and your tests are completely normal, you will receive your results only by: MyChart Message (if you have MyChart) OR A paper copy in the mail If you have any lab test that is abnormal or we need to change your treatment, we will call you to review the results.    Follow-Up: At [facility], you and your health needs are our priority.  As part of our continuing mission to provide you with exceptional heart care, we have created designated Provider Care Teams.  These Care Teams include your primary Cardiologist (physician) and Advanced Practice Providers (APPs -  Physician Assistants and Nurse Practitioners) who all work together to provide you with the care you need, when you need it.  We recommend signing up for the patient portal called "MyChart".  Sign up information is provided on this After Visit Summary.  MyChart is used to connect with patients for Virtual Visits (Telemedicine).  Patients are able to view lab/test results, encounter notes, upcoming appointments, etc.  Non-urgent messages can be sent to your provider as well.   To learn more about what you can do with MyChart, go to [email or web address].    Your next appointment:   6 month(s)  Provider:   Little Ishikawa, MD

## 2023-04-03 LAB — CBC
Hematocrit: 43.6 % (ref 37.5–51.0)
Hemoglobin: 13 g/dL (ref 13.0–17.7)
MCH: 23.3 pg — ABNORMAL LOW (ref 26.6–33.0)
MCHC: 29.8 g/dL — ABNORMAL LOW (ref 31.5–35.7)
MCV: 78 fL — ABNORMAL LOW (ref 79–97)
Platelets: 224 10*3/uL (ref 150–450)
RBC: 5.58 x10E6/uL (ref 4.14–5.80)
RDW: 19.3 % — ABNORMAL HIGH (ref 11.6–15.4)
WBC: 8.3 10*3/uL (ref 3.4–10.8)

## 2023-04-03 LAB — BASIC METABOLIC PANEL
BUN/Creatinine Ratio: 20 (ref 10–24)
BUN: 20 mg/dL (ref 8–27)
CO2: 20 mmol/L (ref 20–29)
Calcium: 9.3 mg/dL (ref 8.6–10.2)
Chloride: 106 mmol/L (ref 96–106)
Creatinine, Ser: 1.02 mg/dL (ref 0.76–1.27)
Glucose: 83 mg/dL (ref 70–99)
Potassium: 4.7 mmol/L (ref 3.5–5.2)
Sodium: 141 mmol/L (ref 134–144)
eGFR: 84 mL/min/{1.73_m2} (ref >59)

## 2023-04-04 ENCOUNTER — Encounter (HOSPITAL_COMMUNITY)
Admission: RE | Admit: 2023-04-04 | Discharge: 2023-04-04 | Disposition: A | Discharge status: Home / Self Care | Payer: BC Managed Care – PPO | Source: Ambulatory Visit | Attending: Cardiology | Admitting: Cardiology

## 2023-04-04 DIAGNOSIS — Z952 Presence of prosthetic heart valve: Secondary | ICD-10-CM | POA: Insufficient documentation

## 2023-04-07 ENCOUNTER — Encounter (HOSPITAL_COMMUNITY): Admission: RE | Admit: 2023-04-07 | Payer: BC Managed Care – PPO | Source: Ambulatory Visit

## 2023-04-07 ENCOUNTER — Telehealth: Payer: Self-pay | Admitting: Cardiology

## 2023-04-07 ENCOUNTER — Telehealth (HOSPITAL_COMMUNITY): Payer: Self-pay

## 2023-04-07 ENCOUNTER — Telehealth: Payer: Self-pay | Admitting: Internal Medicine

## 2023-04-07 NOTE — Telephone Encounter (Signed)
Long term disability paperwork received via fax from [employer] and placed in provider's box up front.

## 2023-04-07 NOTE — Telephone Encounter (Signed)
Caller [facility]) is following-up on patient's return to work status and any restrictions.

## 2023-04-07 NOTE — Telephone Encounter (Signed)
-----   Message from Little Ishikawa sent at 04/07/2023 10:58 AM EST ----- Regarding: RE: Jog request and THRR Yes I agree Thanks, Thayer Ohm ----- Message ----- From: Harrie Jeans Sent: 04/07/2023   8:37 AM EST To: Little Ishikawa, MD Subject: Jog request and THRR                           CARDIAC REHAB PHASE 2  Our mutual patient Christopher Arias has been in the cardiac rehabilitation program approximately 8 weeks and is doing well. Patient would like to begin jogging. His current MET level is 4.22.  Blood pressure is within normal limits, but exercise heart rates may beginning to exceed Target Heart Rate Range(THRR) of 59-118 bpm (40%-80% of age predicted max HR).  If no GXT is planned for the near future and MD agrees, request to increase THR to 40% -90% of age predicted max HR 59-144 bpm. Please indicate if you are agreeable to these changes in exercise prescription.  We appreciate your assistance and referral of your patient to our program. Harrie Jeans ACSM-CEP 04/07/2023 8:33 AM

## 2023-04-07 NOTE — Telephone Encounter (Signed)
Left voicemail to return call to office.

## 2023-04-08 NOTE — Telephone Encounter (Signed)
Spoke w/pt regarding FMLA (long-term dis.) paperwork received [employer]. Pt states he does not plan to return to work as he plans to retire.  I will speak w/leadership for further instructions.  JB, 04-08-23

## 2023-04-08 NOTE — Telephone Encounter (Signed)
Spoke w/pt - pt confirmed nothing further is needed from Dr. Bjorn Pippin in regards to FMLA (long-term dis.) paperwork. Pt will contact necessary persons to inform them of his decision to retire soon.   JB, 04-08-23

## 2023-04-08 NOTE — Telephone Encounter (Signed)
Placed on providers desk

## 2023-04-09 ENCOUNTER — Encounter (HOSPITAL_COMMUNITY): Payer: BC Managed Care – PPO

## 2023-04-09 ENCOUNTER — Ambulatory Visit (INDEPENDENT_AMBULATORY_CARE_PROVIDER_SITE_OTHER): Payer: BC Managed Care – PPO

## 2023-04-09 DIAGNOSIS — Z23 Encounter for immunization: Secondary | ICD-10-CM | POA: Diagnosis not present

## 2023-04-09 NOTE — Progress Notes (Signed)
Pt was given HD flu vaccine w/o any complications.  **Pt request HD flu vaccine.

## 2023-04-11 ENCOUNTER — Encounter (HOSPITAL_COMMUNITY): Payer: BC Managed Care – PPO

## 2023-04-14 ENCOUNTER — Encounter (HOSPITAL_COMMUNITY): Payer: BC Managed Care – PPO

## 2023-04-14 DIAGNOSIS — J449 Chronic obstructive pulmonary disease, unspecified: Secondary | ICD-10-CM | POA: Diagnosis not present

## 2023-04-16 ENCOUNTER — Encounter (HOSPITAL_COMMUNITY): Payer: BC Managed Care – PPO

## 2023-04-18 ENCOUNTER — Encounter (HOSPITAL_COMMUNITY): Payer: BC Managed Care – PPO

## 2023-04-18 NOTE — Telephone Encounter (Signed)
Called Pt and he states he is retired, paperwork not needed.

## 2023-04-24 ENCOUNTER — Encounter: Payer: Self-pay | Admitting: Internal Medicine

## 2023-04-25 ENCOUNTER — Telehealth: Payer: Self-pay | Admitting: Primary Care

## 2023-04-25 NOTE — Telephone Encounter (Signed)
We received a disability form from [employer] on 04/07/2023.   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  He said the form may have something to do with [employer], his former employer.  There is no phone number on the form for me to call so I have filled in the information and included notes that patient is retired and no longer working for [employer].  I will send form to Buelah Manis, NP for signature.

## 2023-04-29 ENCOUNTER — Encounter (HOSPITAL_COMMUNITY): Payer: Self-pay | Admitting: *Deleted

## 2023-04-29 DIAGNOSIS — Z952 Presence of prosthetic heart valve: Secondary | ICD-10-CM

## 2023-04-29 NOTE — Addendum Note (Signed)
Encounter addended by: [place], Amie Cowens G, RD on: 04/29/2023 2:00 PM  Actions taken: Flowsheet data copied forward, Flowsheet accepted

## 2023-04-29 NOTE — Progress Notes (Signed)
Discharge Progress Report  Patient Details  Name: Christopher Arias MRN: [id number] Date of Birth: 02-15-1961 Referring Provider:   Flowsheet Row INTENSIVE CARDIAC REHAB ORIENT from 12/24/2022 in [facility]  Referring Provider [name], MD        Number of Visits: 23  Reason for Discharge:  Early Exit:  Lack of attendance  Smoking History:  Social History   Tobacco Use  Smoking Status Former   Current packs/day: 0.00   Average packs/day: 1 pack/day for 20.0 years (20.0 ttl pk-yrs)   Types: Cigarettes   Start date: 08/24/2001   Quit date: 08/24/2021   Years since quitting: 1.6  Smokeless Tobacco Never    Diagnosis:  11/04/22 Status post aortic valve replacement  ADL UCSD:   Initial Exercise Prescription:   Discharge Exercise Prescription (Final Exercise Prescription Changes):  Exercise Prescription Changes - 03/31/23 1658       Response to Exercise   Blood Pressure (Admit) 112/70    Blood Pressure (Exercise) --   no Ex BP needed over 10 rehab sessions   Blood Pressure (Exit) 116/68    Heart Rate (Admit) 81 bpm    Heart Rate (Exercise) 111 bpm    Heart Rate (Exit) 90 bpm    Oxygen Saturation (Exercise) 91 %    Oxygen Saturation (Exit) 90 %    Rating of Perceived Exertion (Exercise) 10.5    Perceived Dyspnea (Exercise) 0    Symptoms SOB DYS 1 durning exercise    Comments Reviewed MET's and goals    Duration Progress to 30 minutes of  aerobic without signs/symptoms of physical distress    Intensity THRR unchanged      Progression   Progression Continue to progress workloads to maintain intensity without signs/symptoms of physical distress.    Average METs 4.11      Resistance Training   Training Prescription No    Weight 6 lbs wts    Reps 10-15    Time 10 Minutes      Recumbant Elliptical   Level 2    RPM 64    Watts 96    Minutes 15    METs 4.4      Track   Laps 22    Minutes 15     METs 3.81      Home Exercise Plan   Plans to continue exercise at Home (comment)    Frequency Add 4 additional days to program exercise sessions.    Initial Home Exercises Provided 01/17/23             Functional Capacity:   Psychological, QOL, Others - Outcomes: PHQ 2/9:    12/24/2022    1:49 PM 11/20/2022    8:36 AM 05/14/2022   11:17 AM 11/27/2020    2:55 PM 03/08/2019    2:42 PM  Depression screen PHQ 2/9  Decreased Interest 0 0 0 0 0  Down, Depressed, Hopeless 0 0 0 0 0  PHQ - 2 Score 0 0 0 0 0  Altered sleeping 2      Tired, decreased energy 0      Change in appetite 0      Feeling bad or failure about yourself  0      Trouble concentrating 0      Moving slowly or fidgety/restless 0      Suicidal thoughts 0      PHQ-9 Score 2      Difficult  doing work/chores Somewhat difficult        Quality of Life:  Quality of Life - 03/19/23 1630       Quality of Life Scores   Health/Function Post 25.2 %    Socioeconomic Post 29.14 %    Family Post 25.5 %    GLOBAL Post 26.36 %             Personal Goals: Goals established at orientation with interventions provided to work toward goal.    Personal Goals Discharge:  Goals and Risk Factor Review     Row Name 03/26/23 0734 04/29/23 1151           Core Components/Risk Factors/Patient Goals Review   Personal Goals Review Weight Management/Obesity;Improve shortness of breath with ADL's;Lipids Weight Management/Obesity;Improve shortness of breath with ADL's;Lipids      Review Ilai has been doing well with exercise at cardiac rehab. . Vital signs have been stable. Alver's last day of exercise at cardiac rehab was on 03/12/23. Olaf was supposed to complete cardiac rehab on 03/19/23. Caetano had been doing well with exercise at cardiac rehab. . Vital signs have been stable. Ladarrian's last day of attendance at cardiac rehab was on 04/04/23. Shante did not return to exercise at cardiac rehab to complete his 6 minute walk test.  Loraine Leriche enjoyed participaitng in cardiac rehab his attendance was sporadic.      Expected Outcomes Jerrian will continue to participate in cardiac rehab for exercise, nutrition and lifestyle modifications Bradee will continue to participate in cardiac rehab for exercise, nutrition and lifestyle modifications               Exercise Goals and Review:   Exercise Goals Re-Evaluation:  Exercise Goals Re-Evaluation     Row Name 02/28/23 1628 03/31/23 1701           Exercise Goal Re-Evaluation   Exercise Goals Review Increase Physical Activity;Understanding of Exercise Prescription;Increase Strength and Stamina;Knowledge and understanding of Target Heart Rate Range (THRR);Able to understand and use rate of perceived exertion (RPE) scale Increase Physical Activity;Understanding of Exercise Prescription;Increase Strength and Stamina;Knowledge and understanding of Target Heart Rate Range (THRR);Able to understand and use rate of perceived exertion (RPE) scale      Comments Reviewed MET's and goals. Pt tolerated exercise well with an average MET level of 2.7. Pt feels good about his goals, he is having issues wih his SOB but is working with his pulmonologist and different breathing technique. He does state that he is a lot better than when he started. It's just ups and downs Reviewed MET's and goals. Pt tolerated exercise well with an average MET level of 4.11. Pt feels good about his goals and is gaining strength and is back to his bike rides. His wife has also joined him and they are having a good time. He is still having issues with SOB but there is some recent improvment with the help of a new inhaler. Overall doing welll. He will continue his exercise session until 04/14/23. He has also started trying different modalities and is doing really well with the switch      Expected Outcomes Will continue to monitor and progress workloads as tolerated without sign or symptom Will continue to monitor and progress  workloads as tolerated without sign or symptom               Nutrition & Weight - Outcomes:    Nutrition:  Nutrition Therapy & Goals - 03/28/23 1514  Nutrition Therapy   Diet Heart Healthy Diet    Drug/Food Interactions Statins/Certain Fruits      Personal Nutrition Goals   Nutrition Goal Patient to identify strategies for reducing cardiovascular risk by attending the [employer] education and nutrition series weekly.   goal in progress.   Personal Goal #2 Patient to improve diet quality by using the plate method as a guide for meal planning to include lean protein/plant protein, fruits, vegetables, whole grains, nonfat dairy as part of a well-balanced diet.   goal in progress.   Personal Goal #3 Patient to reduce sodium to 1500mg  per day   goal in progress.   Comments Goals in progress. Baron continues to attend the [employer] and nutrition series regularly. He enjoys cooking and has implemented [employer] He has improved understanding of saturated fat, sodium, and fiber recommendations. He is up 6.2# since starting with our program.  Patient will benefit from participation in intensive cardiac rehab for nutrition, exercise, and lifestyle modification.      Intervention Plan   Intervention Prescribe, educate and counsel regarding individualized specific dietary modifications aiming towards targeted core components such as weight, hypertension, lipid management, diabetes, heart failure and other comorbidities.;Nutrition handout(s) given to patient.    Expected Outcomes Short Term Goal: Understand basic principles of dietary content, such as calories, fat, sodium, cholesterol and nutrients.;Long Term Goal: Adherence to prescribed nutrition plan.             Nutrition Discharge:  Nutrition Assessments - 04/04/23 1345       Rate Your Plate Scores   Pre Score 54    Post Score 69             Education Questionnaire Score:  Knowledge Questionnaire  Score - 03/19/23 1630       Knowledge Questionnaire Score   Post Score 19/24             Suhaas  completed 55 exercise and education sessions. Pt maintained good attendance and progressed nicely during their participation in rehab as evidenced by increased MET level. Casmer's attendance was sporadic. Jeriko did not return to exercise to complete his 6 minute walk test. Raymont's last day of attendance at cardiac rehab was on 04/04/23. Piyush has been discharged from cardiac rehab due to nonattendance. Thayer Headings RN BSN

## 2023-05-06 NOTE — Telephone Encounter (Signed)
Signed disability form was faxed to [employer] fax# [phone] and a hard copy was emailed to the patient.

## 2023-05-14 DIAGNOSIS — J449 Chronic obstructive pulmonary disease, unspecified: Secondary | ICD-10-CM | POA: Diagnosis not present

## 2023-05-16 ENCOUNTER — Ambulatory Visit: Payer: BC Managed Care – PPO | Attending: Cardiology

## 2023-05-16 DIAGNOSIS — I4719 Other supraventricular tachycardia: Secondary | ICD-10-CM

## 2023-05-16 LAB — CUP PACEART REMOTE DEVICE CHECK
Battery Remaining Longevity: 177 mo
Battery Voltage: 3.19 V
Brady Statistic AP VP Percent: 2.39 %
Brady Statistic AP VS Percent: 0.62 %
Brady Statistic AS VP Percent: 0.63 %
Brady Statistic AS VS Percent: 96.36 %
Brady Statistic RA Percent Paced: 3.01 %
Brady Statistic RV Percent Paced: 3.02 %
Date Time Interrogation Session: 20241212233338
Implantable Lead Connection Status: 753985
Implantable Lead Connection Status: 753985
Implantable Lead Implant Date: 20240611
Implantable Lead Implant Date: 20240611
Implantable Lead Location: 753859
Implantable Lead Location: 753860
Implantable Lead Model: 3830
Implantable Lead Model: 5076
Implantable Pulse Generator Implant Date: 20240611
Lead Channel Impedance Value: 342 Ohm
Lead Channel Impedance Value: 380 Ohm
Lead Channel Impedance Value: 513 Ohm
Lead Channel Impedance Value: 627 Ohm
Lead Channel Pacing Threshold Amplitude: 1 V
Lead Channel Pacing Threshold Amplitude: 1 V
Lead Channel Pacing Threshold Pulse Width: 0.4 ms
Lead Channel Pacing Threshold Pulse Width: 0.4 ms
Lead Channel Sensing Intrinsic Amplitude: 1.625 mV
Lead Channel Sensing Intrinsic Amplitude: 1.625 mV
Lead Channel Sensing Intrinsic Amplitude: 7 mV
Lead Channel Sensing Intrinsic Amplitude: 7 mV
Lead Channel Setting Pacing Amplitude: 1.5 V
Lead Channel Setting Pacing Amplitude: 2 V
Lead Channel Setting Pacing Pulse Width: 0.4 ms
Lead Channel Setting Sensing Sensitivity: 1.2 mV
Zone Setting Status: 755011

## 2023-05-19 ENCOUNTER — Other Ambulatory Visit: Payer: Self-pay | Admitting: Internal Medicine

## 2023-05-19 NOTE — Telephone Encounter (Signed)
Prescription Request  05/19/2023  LOV: 11/20/2022  What is the name of the medication or equipment? clonazePAM (KLONOPIN) 1 MG tablet   Have you contacted your pharmacy to request a refill? Yes   Which pharmacy would you like this sent to?    [id number] - [place], [place] - [address] AT [facility] OF [address] [place] [address] Phone: [phone] Fax: [phone]  Patient notified that their request is being sent to the clinical staff for review and that they should receive a response within 2 business days.   Please advise at Mobile [phone] (mobile)

## 2023-05-30 ENCOUNTER — Encounter: Payer: Self-pay | Admitting: Internal Medicine

## 2023-05-30 ENCOUNTER — Ambulatory Visit: Payer: BC Managed Care – PPO | Admitting: Internal Medicine

## 2023-05-30 VITALS — BP 120/70 | HR 82 | Temp 97.7°F | Ht 64.0 in | Wt 179.4 lb

## 2023-05-30 DIAGNOSIS — J4489 Other specified chronic obstructive pulmonary disease: Secondary | ICD-10-CM | POA: Diagnosis not present

## 2023-05-30 DIAGNOSIS — J9610 Chronic respiratory failure, unspecified whether with hypoxia or hypercapnia: Secondary | ICD-10-CM

## 2023-05-30 DIAGNOSIS — Z122 Encounter for screening for malignant neoplasm of respiratory organs: Secondary | ICD-10-CM

## 2023-05-30 DIAGNOSIS — J9611 Chronic respiratory failure with hypoxia: Secondary | ICD-10-CM

## 2023-05-30 MED ORDER — BREZTRI AEROSPHERE 160-9-4.8 MCG/ACT IN AERO: 2.0000 | INHALATION_SPRAY | Freq: Two times a day (BID) | RESPIRATORY_TRACT | 11 refills | Status: DC

## 2023-05-30 NOTE — Progress Notes (Deleted)
Christopher Arias    [id number]    December 04, 1960  Primary Care Physician:John, Len Blalock, MD Date of Appointment: 05/30/2023 Established Patient Visit  Chief complaint:   Chief Complaint  Patient presents with   Follow-up     HPI: JOSHUAJAMES Arias is a 62 y.o. man with COPD FEV1 60% of predicted on home oxygen with frequent exacerbations. History of PSVR Pneumonia December 2023. On oxygen since then. Had AVR in June 2024 and PPM for CHB.  Interval Updates: Here for follow up after COPD exacerbation and AVR replacement. Had complications post-op with bleeding and redo sternotomy. Ended up having PPM placement for complete heart block. He is seeing his surgeon Dr Laneta Simmers in July and afterwards hopeful to start cardiac rehab. He has been walking around the grocery store, and around the house. Overall breathing better. Still taking breztri 2 puffs twice a day. Current albuterol use is minimal none in the last two weeks.   DME - [employer]  I have reviewed the patient's family social and past medical history and updated as appropriate.   Past Medical History:  Diagnosis Date   ANTERIOR PITUITARY HYPERFUNCTION 11/23/2009   ANXIETY 11/24/2007   COPD (chronic obstructive pulmonary disease) (HCC) 05/31/2021   GERD 11/24/2007   GYNECOMASTIA 11/10/2009   HYPERLIPIDEMIA 11/24/2007   HYPOTHYROIDISM, POST-RADIATION 03/16/2010   PONV (postoperative nausea and vomiting)    Severe aortic insufficiency     Past Surgical History:  Procedure Laterality Date   AORTIC VALVE REPLACEMENT N/A 11/04/2022   Procedure: AORTIC VALVE REPLACEMENT (AVR) USING INSPIRIS RESILIA 27 MM AORTIC VALVE;  Surgeon: Alleen Borne, MD;  Location: [facility] OR;  Service: Open Heart Surgery;  Laterality: N/A;  Median sternotomy   EXPLORATION POST OPERATIVE OPEN HEART N/A 11/04/2022   Procedure: EXPLORATION POST OPERATIVE OPEN HEART;  Surgeon: Alleen Borne, MD;  Location: [facility] OR;  Service: Open Heart Surgery;  Laterality: N/A;    HEMORRHOID SURGERY     INGUINAL HERNIA REPAIR Bilateral    PACEMAKER IMPLANT N/A 11/12/2022   Procedure: PACEMAKER IMPLANT;  Surgeon: Regan Lemming, MD;  Location: [facility] INVASIVE CV LAB;  Service: Cardiovascular;  Laterality: N/A;   RIGHT/LEFT HEART CATH AND CORONARY ANGIOGRAPHY N/A 08/20/2022   Procedure: RIGHT/LEFT HEART CATH AND CORONARY ANGIOGRAPHY;  Surgeon: Marykay Lex, MD;  Location: [facility] INVASIVE CV LAB;  Service: Cardiovascular;  Laterality: N/A;   TEE WITHOUT CARDIOVERSION N/A 08/01/2022   Procedure: TRANSESOPHAGEAL ECHOCARDIOGRAM (TEE);  Surgeon: Wendall Stade, MD;  Location: [facility] ENDOSCOPY;  Service: Cardiovascular;  Laterality: N/A;   TEE WITHOUT CARDIOVERSION N/A 11/04/2022   Procedure: TRANSESOPHAGEAL ECHOCARDIOGRAM;  Surgeon: Alleen Borne, MD;  Location: [facility] OR;  Service: Open Heart Surgery;  Laterality: N/A;    Family History  Problem Relation Age of Onset   Heart disease Father    Stroke Other    COPD Other    Heart disease Other    Colon cancer Neg Hx     Social History   Occupational History    Employer: [employer]  Tobacco Use   Smoking status: Former    Current packs/day: 0.00    Average packs/day: 1 pack/day for 20.0 years (20.0 ttl pk-yrs)    Types: Cigarettes    Start date: 08/24/2001    Quit date: 08/24/2021    Years since quitting: 1.7   Smokeless tobacco: Never  Vaping Use   Vaping status: Never Used  Substance and Sexual Activity  Alcohol use: Yes    Alcohol/week: 1.0 standard drink of alcohol    Types: 1 Cans of beer per week    Comment: one beer every 2 weeks, maybe 1 mixed drink once a month   Drug use: No   Sexual activity: Not on file     Physical Exam: Blood pressure 120/70, pulse 82, temperature 97.7 F (36.5 C), temperature source Oral, height 5\' 4"  (1.626 m), weight 179 lb 6.4 oz (81.4 kg), SpO2 93%.  Gen:      No acute distress, well appearing ENT:  on nasal cannula, no nasal polyps, mucus membranes moist Lungs:    diminished, no wheezes or crackles CV:        RRR no mrg   Data Reviewed: Imaging: I have personally reviewed the chest xrays Dec 2023 showing hyperinflation, ctpe study dec 2023 shows upper lobe predominant emphysema  PFTs:     Latest Ref Rng & Units 08/12/2022    8:52 AM  PFT Results  FVC-Pre L 3.46   FVC-Predicted Pre % 88   FVC-Post L 3.64   FVC-Predicted Post % 92   Pre FEV1/FVC % % 52   Post FEV1/FCV % % 51   FEV1-Pre L 1.79   FEV1-Predicted Pre % 60   FEV1-Post L 1.86   DLCO uncorrected ml/min/mmHg 8.82   DLCO UNC% % 37   DLCO corrected ml/min/mmHg 8.65   DLCO COR %Predicted % 36   DLVA Predicted % 37   TLC L 7.00   TLC % Predicted % 116   RV % Predicted % 149    I have personally reviewed the patient's PFTs and moderate airflow limitation  Labs:  Immunization status: Immunization History  Administered Date(s) Administered   Fluad Trivalent(High Dose 65+) 04/09/2023   H1N1 07/19/2008   Influenza,inj,Quad PF,6+ Mos 03/12/2013, 06/17/2014, 03/08/2019, 05/30/2021, 05/21/2022   PFIZER(Purple Top)SARS-COV-2 Vaccination 08/30/2019, 09/20/2019   Td 07/19/2008   Tdap 09/13/2012   Zoster Recombinant(Shingrix) 11/27/2020, 01/31/2021    External Records Personally Reviewed: pulmonary, cardiology, surgery, hospital stay  Assessment:  COPD FEV1 60% of predicted  S/p AVR and PPM for CHB.  Plan/Recommendations:  Continue albuterol 2 puffs every 4 hours while awake or nebulizer treatment.  Continue breztri 2 puffs twice a day, gargle after use.   Agree with cardiac rehab once he is able to do so.   Ambulated for POC but he did not desat below 91%.  Return to Care: No follow-ups on file.   Durel Salts, MD Pulmonary and Critical Care Medicine [facility] Office:[phone]

## 2023-05-30 NOTE — Patient Instructions (Addendum)
It was a pleasure to see you today!  Please schedule follow up scheduled with myself in 6 months.  If my schedule is not open yet, we will contact you with a reminder closer to that time. Please call [phone] if you haven't heard from [place] a month before, and always call us sooner if issues or concerns arise. You can also send [place] a message through MyChart, but but aware that this is not to be used for urgent issues and it may take up to 5-7 days to receive a reply. Please be aware that you will likely be able to view your results before I have a chance to respond to them. Please give [place] 5 business days to respond to any non-urgent results.    I am so glad your breathing is doing well! Continue the breztri 2 puffs twice daily. Continue albuterol as needed.   Make sure oxygen levels are over 88%.   Great job eating healthy and exercising. I'm so excited for your new phase in life!

## 2023-05-30 NOTE — Progress Notes (Signed)
JAXXEN KORTAN    [id number]    22-Jan-1961  Primary Care Physician:John, Len Blalock, MD Date of Appointment: 05/30/2023 Established Patient Visit  Chief complaint:   Chief Complaint  Patient presents with   Follow-up     HPI: Christopher Arias is a 62 y.o. man with COPD FEV1 60% of predicted on home oxygen with frequent exacerbations. History of PSVR Pneumonia December 2023. On oxygen since then. Had AVR in June 2024 and PPM for CHB. Quit smoking in March 2023.   Interval Updates: Here for follow up. 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 Also using his ride on mower.   He does have oxygen at home and uses 2L PRN. Very limited use. Does occasionally drop below 88% at home.   Can do most of what he wants to doe. 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   No exacerbations of his COPD. COPD on breztri 2 puffs twice daily.  Has also done a healthy living course on nutrition.    DME - [facility]   I have reviewed the patient's family social and past medical history and updated as appropriate.   Past Medical History:  Diagnosis Date   ANTERIOR PITUITARY HYPERFUNCTION 11/23/2009   ANXIETY 11/24/2007   COPD (chronic obstructive pulmonary disease) (HCC) 05/31/2021   GERD 11/24/2007   GYNECOMASTIA 11/10/2009   HYPERLIPIDEMIA 11/24/2007   HYPOTHYROIDISM, POST-RADIATION 03/16/2010   PONV (postoperative nausea and vomiting)    Severe aortic insufficiency     Past Surgical History:  Procedure Laterality Date   AORTIC VALVE REPLACEMENT N/A 11/04/2022   Procedure: AORTIC VALVE REPLACEMENT (AVR) USING INSPIRIS RESILIA 27 MM AORTIC VALVE;  Surgeon: Alleen Borne, MD;  Location: [facility] OR;  Service: Open Heart Surgery;  Laterality: N/A;  Median sternotomy   EXPLORATION POST OPERATIVE OPEN HEART N/A 11/04/2022   Procedure: EXPLORATION POST OPERATIVE OPEN HEART;  Surgeon: Alleen Borne, MD;  Location: [facility] OR;  Service: Open Heart Surgery;  Laterality: N/A;    HEMORRHOID SURGERY     INGUINAL HERNIA REPAIR Bilateral    PACEMAKER IMPLANT N/A 11/12/2022   Procedure: PACEMAKER IMPLANT;  Surgeon: Regan Lemming, MD;  Location: [facility] INVASIVE CV LAB;  Service: Cardiovascular;  Laterality: N/A;   RIGHT/LEFT HEART CATH AND CORONARY ANGIOGRAPHY N/A 08/20/2022   Procedure: RIGHT/LEFT HEART CATH AND CORONARY ANGIOGRAPHY;  Surgeon: Marykay Lex, MD;  [facility];  Service: Cardiovascular;  Laterality: N/A;   TEE WITHOUT CARDIOVERSION N/A 08/01/2022   Procedure: TRANSESOPHAGEAL ECHOCARDIOGRAM (TEE);  Surgeon: Wendall Stade, MD;  Location: [facility] ENDOSCOPY;  Service: Cardiovascular;  Laterality: N/A;   TEE WITHOUT CARDIOVERSION N/A 11/04/2022   Procedure: TRANSESOPHAGEAL ECHOCARDIOGRAM;  Surgeon: Alleen Borne, MD;  Location[facility] OR;  Service: Open Heart Surgery;  Laterality: N/A;    Family History  Problem Relation Age of Onset   Heart disease Father    Stroke Other    COPD Other    Heart disease Other    Colon cancer Neg Hx     Social History   Occupational History    Employer: [employer]  Tobacco Use   Smoking status: Former    Current packs/day: 0.00    Average packs/day: 1 pack/day for 20.0 years (20.0 ttl pk-yrs)    Types: Cigarettes    Start date: 08/24/2001    Quit date: 08/24/2021    Years since quitting:  1.7   Smokeless tobacco: Never  Vaping Use   Vaping status: Never Used  Substance and Sexual Activity   Alcohol use: Yes    Alcohol/week: 1.0 standard drink of alcohol    Types: 1 Cans of beer per week    Comment: one beer every 2 weeks, maybe 1 mixed drink once a month   Drug use: No   Sexual activity: Not on file     Physical Exam: Blood pressure 120/70, pulse 82, temperature 97.7 F (36.5 C), temperature source Oral, height 5\' 4"  (1.626 m), weight 179 lb 6.4 oz (81.4 kg), SpO2 93%.  Gen:      No acute distress, well appearing ENT:  on nasal cannula, no nasal polyps, mucus membranes moist Lungs:   diminished,  no wheezes or crackles CV:        RRR no mrg   Data Reviewed: Imaging: I have personally reviewed the chest xrays Dec 2023 showing hyperinflation, ctpe study dec 2023 shows upper lobe predominant emphysema  PFTs:     Latest Ref Rng & Units 08/12/2022    8:52 AM  PFT Results  FVC-Pre L 3.46   FVC-Predicted Pre % 88   FVC-Post L 3.64   FVC-Predicted Post % 92   Pre FEV1/FVC % % 52   Post FEV1/FCV % % 51   FEV1-Pre L 1.79   FEV1-Predicted Pre % 60   FEV1-Post L 1.86   DLCO uncorrected ml/min/mmHg 8.82   DLCO UNC% % 37   DLCO corrected ml/min/mmHg 8.65   DLCO COR %Predicted % 36   DLVA Predicted % 37   TLC L 7.00   TLC % Predicted % 116   RV % Predicted % 149    I have personally reviewed the patient's PFTs and moderate airflow limitation  Labs:  Immunization status: Immunization History  Administered Date(s) Administered   Fluad Trivalent(High Dose 65+) 04/09/2023   H1N1 07/19/2008   Influenza,inj,Quad PF,6+ Mos 03/12/2013, 06/17/2014, 03/08/2019, 05/30/2021, 05/21/2022   PFIZER(Purple Top)SARS-COV-2 Vaccination 08/30/2019, 09/20/2019   Td 07/19/2008   Tdap 09/13/2012   Zoster Recombinant(Shingrix) 11/27/2020, 01/31/2021    External Records Personally Reviewed: pulmonary, cardiology, surgery, hospital stay  Assessment:  COPD FEV1 60% of predicted  S/p AVR and PPM for CHB. Chronic respiratory failure Need for lung cancer screening  Plan/Recommendations:  I am so glad your breathing is doing well! Continue the breztri 2 puffs twice daily. Continue albuterol as needed.   Make sure oxygen levels are over 88%.   Great job eating healthy and exercising. I'm so excited for your new phase in life!  >20 pack year smoking history. Meets criteria for lung cancer screening, due in may 2025. Will refer today.   Return to Care: Return in about 6 months (around 11/28/2023).   Durel Salts, MD Pulmonary and Critical Care Medicine [facility] Office:[phone]

## 2023-06-03 ENCOUNTER — Telehealth: Payer: Self-pay | Admitting: *Deleted

## 2023-06-03 NOTE — Telephone Encounter (Signed)
 Called patient and left message to call office  because additional paperwork for FMLA had been faxed to office.Left message for patient to call office  if there was any additional paperwork for FMLA and retirement that office needed to fill out.

## 2023-06-14 DIAGNOSIS — J449 Chronic obstructive pulmonary disease, unspecified: Secondary | ICD-10-CM | POA: Diagnosis not present

## 2023-07-15 DIAGNOSIS — J449 Chronic obstructive pulmonary disease, unspecified: Secondary | ICD-10-CM | POA: Diagnosis not present

## 2023-07-16 ENCOUNTER — Telehealth: Payer: Self-pay | Admitting: *Deleted

## 2023-07-16 ENCOUNTER — Telehealth: Payer: Self-pay | Admitting: Cardiology

## 2023-07-16 NOTE — Telephone Encounter (Signed)
Left vm for pt in regards to [facility] paperwork received [employer] -  spoke w/pt in November & he stated he had plans to retire. Advised pt to call back if additional information is needed or has changed.  JB, 07-16-23

## 2023-07-16 NOTE — Telephone Encounter (Signed)
Left message to call back  (Left 2 messages on Monday, 2/10)

## 2023-08-08 ENCOUNTER — Other Ambulatory Visit: Payer: Self-pay | Admitting: Internal Medicine

## 2023-08-12 DIAGNOSIS — J449 Chronic obstructive pulmonary disease, unspecified: Secondary | ICD-10-CM | POA: Diagnosis not present

## 2023-08-15 ENCOUNTER — Ambulatory Visit (INDEPENDENT_AMBULATORY_CARE_PROVIDER_SITE_OTHER): Payer: Self-pay

## 2023-08-15 DIAGNOSIS — I4719 Other supraventricular tachycardia: Secondary | ICD-10-CM | POA: Diagnosis not present

## 2023-08-15 LAB — CUP PACEART REMOTE DEVICE CHECK
Battery Remaining Longevity: 174 mo
Battery Voltage: 3.16 V
Brady Statistic AP VP Percent: 3.22 %
Brady Statistic AP VS Percent: 0.73 %
Brady Statistic AS VP Percent: 0.31 %
Brady Statistic AS VS Percent: 95.74 %
Brady Statistic RA Percent Paced: 3.95 %
Brady Statistic RV Percent Paced: 3.53 %
Date Time Interrogation Session: 20250314055900
Implantable Lead Connection Status: 753985
Implantable Lead Connection Status: 753985
Implantable Lead Implant Date: 20240611
Implantable Lead Implant Date: 20240611
Implantable Lead Location: 753859
Implantable Lead Location: 753860
Implantable Lead Model: 3830
Implantable Lead Model: 5076
Implantable Pulse Generator Implant Date: 20240611
Lead Channel Impedance Value: 361 Ohm
Lead Channel Impedance Value: 418 Ohm
Lead Channel Impedance Value: 513 Ohm
Lead Channel Impedance Value: 589 Ohm
Lead Channel Pacing Threshold Amplitude: 0.875 V
Lead Channel Pacing Threshold Amplitude: 0.875 V
Lead Channel Pacing Threshold Pulse Width: 0.4 ms
Lead Channel Pacing Threshold Pulse Width: 0.4 ms
Lead Channel Sensing Intrinsic Amplitude: 2.25 mV
Lead Channel Sensing Intrinsic Amplitude: 2.25 mV
Lead Channel Sensing Intrinsic Amplitude: 5.25 mV
Lead Channel Sensing Intrinsic Amplitude: 5.25 mV
Lead Channel Setting Pacing Amplitude: 1.5 V
Lead Channel Setting Pacing Amplitude: 2 V
Lead Channel Setting Pacing Pulse Width: 0.4 ms
Lead Channel Setting Sensing Sensitivity: 1.2 mV
Zone Setting Status: 755011

## 2023-08-16 ENCOUNTER — Other Ambulatory Visit: Payer: Self-pay | Admitting: Physician Assistant

## 2023-08-16 ENCOUNTER — Other Ambulatory Visit: Payer: Self-pay | Admitting: Internal Medicine

## 2023-08-18 DIAGNOSIS — H1033 Unspecified acute conjunctivitis, bilateral: Secondary | ICD-10-CM | POA: Diagnosis not present

## 2023-08-19 ENCOUNTER — Ambulatory Visit: Admitting: Internal Medicine

## 2023-08-19 ENCOUNTER — Other Ambulatory Visit: Payer: Self-pay | Admitting: Internal Medicine

## 2023-08-19 ENCOUNTER — Encounter: Payer: Self-pay | Admitting: Internal Medicine

## 2023-08-19 VITALS — BP 122/70 | HR 90 | Temp 97.4°F | Ht 64.0 in | Wt 178.0 lb

## 2023-08-19 DIAGNOSIS — Z0000 Encounter for general adult medical examination without abnormal findings: Secondary | ICD-10-CM

## 2023-08-19 DIAGNOSIS — R739 Hyperglycemia, unspecified: Secondary | ICD-10-CM | POA: Diagnosis not present

## 2023-08-19 DIAGNOSIS — Z1211 Encounter for screening for malignant neoplasm of colon: Secondary | ICD-10-CM

## 2023-08-19 DIAGNOSIS — J449 Chronic obstructive pulmonary disease, unspecified: Secondary | ICD-10-CM

## 2023-08-19 DIAGNOSIS — Z125 Encounter for screening for malignant neoplasm of prostate: Secondary | ICD-10-CM

## 2023-08-19 DIAGNOSIS — E559 Vitamin D deficiency, unspecified: Secondary | ICD-10-CM | POA: Diagnosis not present

## 2023-08-19 DIAGNOSIS — E782 Mixed hyperlipidemia: Secondary | ICD-10-CM

## 2023-08-19 DIAGNOSIS — E039 Hypothyroidism, unspecified: Secondary | ICD-10-CM | POA: Diagnosis not present

## 2023-08-19 DIAGNOSIS — E538 Deficiency of other specified B group vitamins: Secondary | ICD-10-CM

## 2023-08-19 DIAGNOSIS — Z0001 Encounter for general adult medical examination with abnormal findings: Secondary | ICD-10-CM

## 2023-08-19 LAB — CBC WITH DIFFERENTIAL/PLATELET
Basophils Absolute: 0 10*3/uL (ref 0.0–0.1)
Basophils Relative: 0.5 % (ref 0.0–3.0)
Eosinophils Absolute: 0.1 10*3/uL (ref 0.0–0.7)
Eosinophils Relative: 1.1 % (ref 0.0–5.0)
HCT: 47.1 % (ref 39.0–52.0)
Hemoglobin: 15.2 g/dL (ref 13.0–17.0)
Lymphocytes Relative: 16.8 % (ref 12.0–46.0)
Lymphs Abs: 1.3 10*3/uL (ref 0.7–4.0)
MCHC: 32.3 g/dL (ref 30.0–36.0)
MCV: 83.4 fl (ref 78.0–100.0)
Monocytes Absolute: 0.8 10*3/uL (ref 0.1–1.0)
Monocytes Relative: 10.4 % (ref 3.0–12.0)
Neutro Abs: 5.4 10*3/uL (ref 1.4–7.7)
Neutrophils Relative %: 71.2 % (ref 43.0–77.0)
Platelets: 185 10*3/uL (ref 150.0–400.0)
RBC: 5.65 Mil/uL (ref 4.22–5.81)
RDW: 16.9 % — ABNORMAL HIGH (ref 11.5–15.5)
WBC: 7.7 10*3/uL (ref 4.0–10.5)

## 2023-08-19 LAB — URINALYSIS, ROUTINE W REFLEX MICROSCOPIC
Bilirubin Urine: NEGATIVE
Hgb urine dipstick: NEGATIVE
Ketones, ur: NEGATIVE
Leukocytes,Ua: NEGATIVE
Nitrite: NEGATIVE
RBC / HPF: NONE SEEN (ref 0–?)
Specific Gravity, Urine: >=1.03 — AB (ref 1.000–1.030)
Total Protein, Urine: NEGATIVE
Urine Glucose: NEGATIVE
Urobilinogen, UA: 0.2 (ref 0.0–1.0)
pH: 6 (ref 5.0–8.0)

## 2023-08-19 LAB — HEPATIC FUNCTION PANEL
ALT: 18 U/L (ref 0–53)
AST: 15 U/L (ref 0–37)
Albumin: 4.9 g/dL (ref 3.5–5.2)
Alkaline Phosphatase: 64 U/L (ref 39–117)
Bilirubin, Direct: 0.1 mg/dL (ref 0.0–0.3)
Total Bilirubin: 0.5 mg/dL (ref 0.2–1.2)
Total Protein: 7.1 g/dL (ref 6.0–8.3)

## 2023-08-19 LAB — MICROALBUMIN / CREATININE URINE RATIO
Creatinine,U: 164.2 mg/dL
Microalb Creat Ratio: 7.8 mg/g (ref 0.0–30.0)
Microalb, Ur: 1.3 mg/dL (ref 0.0–1.9)

## 2023-08-19 LAB — LIPID PANEL
Cholesterol: 159 mg/dL (ref 0–200)
HDL: 44.4 mg/dL (ref >39.00)
LDL Cholesterol: 94 mg/dL (ref 0–99)
NonHDL: 114.14
Total CHOL/HDL Ratio: 4
Triglycerides: 103 mg/dL (ref 0.0–149.0)
VLDL: 20.6 mg/dL (ref 0.0–40.0)

## 2023-08-19 LAB — BASIC METABOLIC PANEL
BUN: 20 mg/dL (ref 6–23)
CO2: 25 meq/L (ref 19–32)
Calcium: 9.6 mg/dL (ref 8.4–10.5)
Chloride: 107 meq/L (ref 96–112)
Creatinine, Ser: 0.96 mg/dL (ref 0.40–1.50)
GFR: 84.81 mL/min (ref >60.00)
Glucose, Bld: 77 mg/dL (ref 70–99)
Potassium: 4.7 meq/L (ref 3.5–5.1)
Sodium: 141 meq/L (ref 135–145)

## 2023-08-19 LAB — PSA: PSA: 4.88 ng/mL — ABNORMAL HIGH (ref 0.10–4.00)

## 2023-08-19 LAB — VITAMIN D 25 HYDROXY (VIT D DEFICIENCY, FRACTURES): VITD: 14.05 ng/mL — ABNORMAL LOW (ref 30.00–100.00)

## 2023-08-19 LAB — TSH: TSH: 8.49 u[IU]/mL — ABNORMAL HIGH (ref 0.35–5.50)

## 2023-08-19 LAB — HEMOGLOBIN A1C: Hgb A1c MFr Bld: 5.9 % (ref 4.6–6.5)

## 2023-08-19 LAB — VITAMIN B12: Vitamin B-12: 267 pg/mL (ref 211–911)

## 2023-08-19 MED ORDER — SERTRALINE HCL 100 MG PO TABS: 200.0000 mg | ORAL_TABLET | Freq: Every day | ORAL | 3 refills | Status: AC

## 2023-08-19 MED ORDER — TAMSULOSIN HCL 0.4 MG PO CAPS: 0.4000 mg | ORAL_CAPSULE | Freq: Every day | ORAL | 3 refills | Status: AC

## 2023-08-19 MED ORDER — ATORVASTATIN CALCIUM 20 MG PO TABS: 20.0000 mg | ORAL_TABLET | Freq: Every day | ORAL | 3 refills | Status: AC

## 2023-08-19 MED ORDER — LEVOTHYROXINE SODIUM 100 MCG PO TABS: 100.0000 ug | ORAL_TABLET | Freq: Every day | ORAL | 3 refills | Status: DC

## 2023-08-19 MED ORDER — LEVOTHYROXINE SODIUM 112 MCG PO TABS: 112.0000 ug | ORAL_TABLET | Freq: Every day | ORAL | 3 refills | Status: AC

## 2023-08-19 NOTE — Progress Notes (Unsigned)
 Patient ID: Christopher Arias, male   DOB: August 10, 1960, 63 y.o.   MRN: [id number]         Chief Complaint:: wellness exam and Medical Management of Chronic Issues (Medication follow up , wants to know if he should still be taking the flomax )  , , bph, copd, low vit d, low thyroid, hld, hyperglycemia       HPI:  Christopher Arias is a 63 y.o. male here for wellness exam; due for colonoscopy, for tdap and prevnar 20 at the pharmacy; o/w up to date                Also has e-bike every day up to 8 miles day, joined the gym for several times per wk, wit hlt weights, waking treadmill and elliptcal.  Has gained several lbs after heart valve surgury.  Trying to avoid cholesterol and salt,  Pt denies chest pain, increased sob or doe, wheezing, orthopnea, PND, increased LE swelling, palpitations, dizziness or syncope.   Pt denies polydipsia, polyuria, or new focal neuro s/s.    Pt denies fever, wt loss, night sweats, loss of appetite, or other constitutional symptoms  Denies urinary symptoms such as dysuria, frequency, urgency, flank pain, hematuria or n/v, fever, chills, but still has weak urine stream at times .      Wt Readings from Last 3 Encounters:  08/19/23 178 lb (80.7 kg)  05/30/23 179 lb 6.4 oz (81.4 kg)  04/02/23 171 lb (77.6 kg)   BP Readings from Last 3 Encounters:  08/19/23 122/70  05/30/23 120/70  04/02/23 104/72   Immunization History  Administered Date(s) Administered   Fluad Trivalent(High Dose 65+) 04/09/2023   H1N1 07/19/2008   Influenza,inj,Quad PF,6+ Mos 03/12/2013, 06/17/2014, 03/08/2019, 05/30/2021, 05/21/2022   PFIZER(Purple Top)SARS-COV-2 Vaccination 08/30/2019, 09/20/2019   Td 07/19/2008   Tdap 09/13/2012   Zoster Recombinant(Shingrix) 11/27/2020, 01/31/2021   Health Maintenance Due  Topic Date Due   Pneumococcal Vaccine 38-71 Years old (1 of 2 - PCV) Never done   DTaP/Tdap/Td (3 - Td or Tdap) 09/14/2022   Colonoscopy  04/28/2023      Past Medical History:  Diagnosis Date    ANTERIOR PITUITARY HYPERFUNCTION 11/23/2009   ANXIETY 11/24/2007   COPD (chronic obstructive pulmonary disease) (HCC) 05/31/2021   GERD 11/24/2007   GYNECOMASTIA 11/10/2009   HYPERLIPIDEMIA 11/24/2007   HYPOTHYROIDISM, POST-RADIATION 03/16/2010   PONV (postoperative nausea and vomiting)    Severe aortic insufficiency    Past Surgical History:  Procedure Laterality Date   AORTIC VALVE REPLACEMENT N/A 11/04/2022   Procedure: AORTIC VALVE REPLACEMENT (AVR) USING INSPIRIS RESILIA 27 MM AORTIC VALVE;  Surgeon: Alleen Borne, MD;  [facility] OR;  Service: Open Heart Surgery;  Laterality: N/A;  Median sternotomy   EXPLORATION POST OPERATIVE OPEN HEART N/A 11/04/2022   Procedure: EXPLORATION POST OPERATIVE OPEN HEART;  Surgeon: Alleen Borne, MD;  Location: [facility] OR;  Service: Open Heart Surgery;  Laterality: N/A;   HEMORRHOID SURGERY     INGUINAL HERNIA REPAIR Bilateral    PACEMAKER IMPLANT N/A 11/12/2022   Procedure: PACEMAKER IMPLANT;  Surgeon: Regan Lemming, MD;  [facility]: [facility] INVASIVE CV LAB;  Service: Cardiovascular;  Laterality: N/A;   RIGHT/LEFT HEART CATH AND CORONARY ANGIOGRAPHY N/A 08/20/2022   Procedure: RIGHT/LEFT HEART CATH AND CORONARY ANGIOGRAPHY;  Surgeon: Marykay Lex, MD;  Location: [facility] INVASIVE CV LAB;  Service: Cardiovascular;  Laterality: N/A;   TEE WITHOUT CARDIOVERSION N/A 08/01/2022   Procedure: TRANSESOPHAGEAL ECHOCARDIOGRAM (TEE);  Surgeon: Wendall Stade, MD;  Location: [facility] ENDOSCOPY;  Service: Cardiovascular;  Laterality: N/A;   TEE WITHOUT CARDIOVERSION N/A 11/04/2022   Procedure: TRANSESOPHAGEAL ECHOCARDIOGRAM;  Surgeon: Alleen Borne, MD;  Location: [facility] OR;  Service: Open Heart Surgery;  Laterality: N/A;    reports that he quit smoking about 1 years ago. His smoking use included cigarettes. He started smoking about 22 years ago. He has a 20 pack-year smoking history. He has never used smokeless tobacco. He reports current alcohol use of about 1.0 standard  drink of alcohol per week. He reports that he does not use drugs. family history includes COPD in an other family member; Heart disease in his father and another family member; Stroke in an other family member. Allergies  Allergen Reactions   Atenolol Other (See Comments)    Nose bleeds   Current Outpatient Medications on File Prior to Visit  Medication Sig Dispense Refill   albuterol (VENTOLIN HFA) 108 (90 Base) MCG/ACT inhaler Inhale 2 puffs into the lungs every 6 (six) hours as needed for wheezing or shortness of breath. 18 g 3   aspirin EC 81 MG tablet Take 1 tablet (81 mg total) by mouth daily. Swallow whole.     Budeson-Glycopyrrol-Formoterol (BREZTRI AEROSPHERE) 160-9-4.8 MCG/ACT AERO Inhale 2 puffs into the lungs in the morning and at bedtime. 10.7 g 11   clonazePAM (KLONOPIN) 1 MG tablet TAKE 1 TABLET BY MOUTH TWICE A DAY AS NEEDED 60 tablet 2   diphenhydramine-acetaminophen (TYLENOL PM) 25-500 MG TABS tablet Take 2 tablets by mouth at bedtime.     loteprednol (LOTEMAX) 0.5 % ophthalmic suspension      ondansetron (ZOFRAN-ODT) 4 MG disintegrating tablet Take 1 tablet (4 mg total) by mouth every 8 (eight) hours as needed for nausea or vomiting. 30 tablet 1   OXYGEN Inhale 2-3 L into the lungs at bedtime. As needed during the day     varenicline (CHANTIX) 1 MG tablet TAKE 1 TABLET BY MOUTH TWICE A DAY 180 tablet 2   metoprolol succinate (TOPROL-XL) 50 MG 24 hr tablet Take 1 tablet (50 mg total) by mouth daily. Take with or immediately following a meal. 90 tablet 3   No current facility-administered medications on file prior to visit.        ROS:  All others reviewed and negative.  Objective        PE:  BP 122/70 (BP Location: Left Arm, Patient Position: Sitting, Cuff Size: Normal)   Pulse 90   Temp (!) 97.4 F (36.3 C) (Oral)   Ht 5\' 4"  (1.626 m)   Wt 178 lb (80.7 kg)   SpO2 95%   BMI 30.55 kg/m                 Constitutional: Pt appears in NAD               HENT: Head:  NCAT.                Right Ear: External ear normal.                 Left Ear: External ear normal.                Eyes: . Pupils are equal, round, and reactive to light. Conjunctivae and EOM are normal               Nose: without d/c or deformity  Neck: Neck supple. Gross normal ROM               Cardiovascular: Normal rate and regular rhythm.                 Pulmonary/Chest: Effort normal and breath sounds without rales or wheezing.                Abd:  Soft, NT, ND, + BS, no organomegaly               Neurological: Pt is alert. At baseline orientation, motor grossly intact               Skin: Skin is warm. No rashes, no other new lesions, LE edema - none               Psychiatric: Pt behavior is normal without agitation   Micro: none  Cardiac tracings I have personally interpreted today:  none  Pertinent Radiological findings (summarize): none   Lab Results  Component Value Date   WBC 7.7 08/19/2023   HGB 15.2 08/19/2023   HCT 47.1 08/19/2023   PLT 185.0 08/19/2023   GLUCOSE 77 08/19/2023   CHOL 159 08/19/2023   TRIG 103.0 08/19/2023   HDL 44.40 08/19/2023   LDLCALC 94 08/19/2023   ALT 18 08/19/2023   AST 15 08/19/2023   NA 141 08/19/2023   K 4.7 08/19/2023   CL 107 08/19/2023   CREATININE 0.96 08/19/2023   BUN 20 08/19/2023   CO2 25 08/19/2023   TSH 8.49 (H) 08/19/2023   PSA 4.88 (H) 08/19/2023   INR 1.6 (H) 11/04/2022   HGBA1C 5.9 08/19/2023   MICROALBUR 1.3 08/19/2023   Assessment/Plan:  Christopher Arias is a 63 y.o. White or Caucasian [1] male with  has a past medical history of ANTERIOR PITUITARY HYPERFUNCTION (11/23/2009), ANXIETY (11/24/2007), COPD (chronic obstructive pulmonary disease) (HCC) (05/31/2021), GERD (11/24/2007), GYNECOMASTIA (11/10/2009), HYPERLIPIDEMIA (11/24/2007), HYPOTHYROIDISM, POST-RADIATION (03/16/2010), PONV (postoperative nausea and vomiting), and Severe aortic insufficiency.  Encounter for well adult exam with abnormal  findings Age and sex appropriate education and counseling updated with regular exercise and diet Referrals for preventative services - for colonoscopy Immunizations addressed - for tdap and prevnar at the pharmacy Smoking counseling  - none needed Evidence for depression or other mood disorder - none significant Most recent labs reviewed. I have personally reviewed and have noted: 1) the patient's medical and social history 2) The patient's current medications and supplements 3) The patient's height, weight, and BMI have been recorded in the chart   COPD (chronic obstructive pulmonary disease) (HCC) Stable overall, cont inhaler prn  Vitamin D deficiency Last vitamin D Lab Results  Component Value Date   VD25OH 14.05 (L) 08/19/2023   Low, to start oral replacement   Hypothyroidism Lab Results  Component Value Date   TSH 8.49 (H) 08/19/2023   uncontrolled, pt for increase to 112 mcg every day levothyroxine   Hyperlipidemia Lab Results  Component Value Date   LDLCALC 94 08/19/2023   Stable, pt to continue current statin lipitor 20 qd   Hyperglycemia Lab Results  Component Value Date   HGBA1C 5.9 08/19/2023   Stable, pt to continue current medical treatment  - diet, wt control  Followup: Return in about 1 year (around 08/18/2024).  Oliver Barre, MD 08/21/2023 9:39 PM [facility] Internal Medicine

## 2023-08-19 NOTE — Patient Instructions (Signed)
 Very sorry - we are out of the Prevnar 20 today;  you could go to any pharmacy that does shots for this, however  Please continue all other medications as before, and refills have been done if requested.  Please have the pharmacy call with any other refills you may need.  Please continue your efforts at being more active, low cholesterol diet, and weight control.  You are otherwise up to date with prevention measures today.  Please keep your appointments with your specialists as you may have planned  You will be contacted regarding the referral for: colonoscopy  Please go to the LAB at the blood drawing area for the tests to be done  You will be contacted by phone if any changes need to be made immediately.  Otherwise, you will receive a letter about your results with an explanation, but please check with MyChart first.  Please make an Appointment to return for your 1 year visit, or sooner if needed

## 2023-08-20 ENCOUNTER — Telehealth: Payer: Self-pay

## 2023-08-20 DIAGNOSIS — Z87891 Personal history of nicotine dependence: Secondary | ICD-10-CM

## 2023-08-20 DIAGNOSIS — Z122 Encounter for screening for malignant neoplasm of respiratory organs: Secondary | ICD-10-CM

## 2023-08-20 NOTE — Telephone Encounter (Signed)
.  Lung Cancer Screening Narrative/Criteria Questionnaire (Cigarette Smokers Only- No Cigars/Pipes/vapes)   Christopher Arias   SDMV:09/01/2023 at 1:00 pm with Baxter Hire       01/29/1961   LDCT: 09/04/2023 at 1:00 pm at GI    63 y.o.   Phone: [phone]  Lung Screening Narrative (confirm age 5-77 yrs Medicare / 50-80 yrs Private pay insurance)   Insurance information:BCBS   Referring Provider:Singh, MD   This screening involves an initial phone call with a team member from our program. It is called a shared decision making visit. The initial meeting is required by  insurance and Medicare to make sure you understand the program. This appointment takes about 15-20 minutes to complete. You will complete the screening scan at your scheduled date/time.  This scan takes about 5-10 minutes to complete. You can eat and drink normally before and after the scan.  Criteria questions for Lung Cancer Screening:   Are you a current or former smoker? Former Age began smoking: 16   If you are a former smoker, what year did you quit smoking? 08/2321 (within 15 yrs)   To calculate your smoking history, I need an accurate estimate of how many packs of cigarettes you smoked per day and for how many years. (Not just the number of PPD you are now smoking)   Years smoking 44 x Packs per day 1 = Pack years 44   (at least 20 pack yrs)   (Make sure they understand that we need to know how much they have smoked in the past, not just the number of PPD they are smoking now)  Do you have a personal history of cancer?  No    Do you have a family history of cancer? No  Are you coughing up blood?  No  Have you had unexplained weight loss of 15 lbs or more in the last 6 months? No  It looks like you meet all criteria.  When would be a good time for [place] to schedule you for this screening?   Additional information: N/A

## 2023-08-21 ENCOUNTER — Encounter: Payer: Self-pay | Admitting: Internal Medicine

## 2023-08-21 NOTE — Assessment & Plan Note (Addendum)
 Age and sex appropriate education and counseling updated with regular exercise and diet Referrals for preventative services - for colonoscopy Immunizations addressed - for tdap and prevnar at the pharmacy Smoking counseling  - none needed Evidence for depression or other mood disorder - none significant Most recent labs reviewed. I have personally reviewed and have noted: 1) the patient's medical and social history 2) The patient's current medications and supplements 3) The patient's height, weight, and BMI have been recorded in the chart

## 2023-08-21 NOTE — Assessment & Plan Note (Signed)
 Last vitamin D Lab Results  Component Value Date   VD25OH 14.05 (L) 08/19/2023   Low, to start oral replacement

## 2023-08-21 NOTE — Assessment & Plan Note (Signed)
 Lab Results  Component Value Date   HGBA1C 5.9 08/19/2023   Stable, pt to continue current medical treatment  - diet, wt control

## 2023-08-21 NOTE — Assessment & Plan Note (Signed)
 Lab Results  Component Value Date   LDLCALC 94 08/19/2023   Stable, pt to continue current statin lipitor 20 qd

## 2023-08-21 NOTE — Assessment & Plan Note (Signed)
Stable overall, cont inhaler prn 

## 2023-08-21 NOTE — Assessment & Plan Note (Signed)
 Lab Results  Component Value Date   TSH 8.49 (H) 08/19/2023   uncontrolled, pt for increase to 112 mcg every day levothyroxine

## 2023-09-01 ENCOUNTER — Ambulatory Visit (INDEPENDENT_AMBULATORY_CARE_PROVIDER_SITE_OTHER): Admitting: Acute Care

## 2023-09-01 DIAGNOSIS — Z87891 Personal history of nicotine dependence: Secondary | ICD-10-CM | POA: Diagnosis not present

## 2023-09-01 NOTE — Patient Instructions (Signed)

## 2023-09-01 NOTE — Progress Notes (Signed)
 Provider Attestation I agree with the documentation of the Shared Decision Making visit,  smoking cessation counseling if appropriate, and verification or eligibility for lung cancer screening as documented by the RN Nurse Navigator.   Christopher Bullock, MSN, AGACNP-BC [facility] See Amion for personal pager PCCM on call pager [phone]     Virtual Visit via Telephone Note  I connected with Christopher Arias on 09/01/23 at  1:00 PM EDT by telephone and verified that I am speaking with the correct person using two identifiers.  Location: Patient: in home Provider: [address], [place], [place], [address] Shared Decision Making Visit Lung Cancer Screening Program [phone])   Eligibility: Age 63 y.o. Pack Years Smoking History Calculation 44 (# packs/per year x # years smoked) Recent History of coughing up blood  no Unexplained weight loss? no ( >Than 15 pounds within the last 6 months ) Prior History Lung / other cancer no (Diagnosis within the last 5 years already requiring surveillance chest CT Scans). Smoking Status Former Smoker Former Smokers: Years since quit: 2 years  Quit Date: 08-2021  Visit Components: Discussion included one or more decision making aids. yes Discussion included risk/benefits of screening. yes Discussion included potential follow up diagnostic testing for abnormal scans. yes Discussion included meaning and risk of over diagnosis. yes Discussion included meaning and risk of False Positives. yes Discussion included meaning of total radiation exposure. yes  Counseling Included: Importance of adherence to annual lung cancer LDCT screening. yes Impact of comorbidities on ability to participate in the program. yes Ability and willingness to under diagnostic treatment. yes  Smoking Cessation Counseling: Current Smokers:  Discussed importance of smoking cessation. yes Information about tobacco cessation classes and  interventions provided to patient. yes Patient provided with "ticket" for LDCT Scan. yes Symptomatic Patient. no  Counseling NA Diagnosis Code: Tobacco Use Z72.0 Asymptomatic Patient yes  Counseling (Intermediate counseling: > three minutes counseling) Y4034 Former Smokers:  Discussed the importance of maintaining cigarette abstinence. yes Diagnosis Code: Personal History of Nicotine Dependence. V42.595 Information about tobacco cessation classes and interventions provided to patient. Yes Patient provided with "ticket" for LDCT Scan. yes Written Order for Lung Cancer Screening with LDCT placed in Epic. Yes (CT Chest Lung Cancer Screening Low Dose W/O CM) [id number] Z12.2-Screening of respiratory organs Z87.891-Personal history of nicotine dependence   Christopher Gaskins, RN 09/01/23

## 2023-09-04 ENCOUNTER — Ambulatory Visit
Admission: RE | Admit: 2023-09-04 | Discharge: 2023-09-04 | Disposition: A | Discharge status: Home / Self Care | Source: Ambulatory Visit | Attending: Acute Care | Admitting: Acute Care

## 2023-09-04 DIAGNOSIS — Z122 Encounter for screening for malignant neoplasm of respiratory organs: Secondary | ICD-10-CM

## 2023-09-04 DIAGNOSIS — Z87891 Personal history of nicotine dependence: Secondary | ICD-10-CM

## 2023-09-12 DIAGNOSIS — J449 Chronic obstructive pulmonary disease, unspecified: Secondary | ICD-10-CM | POA: Diagnosis not present

## 2023-09-17 NOTE — Progress Notes (Signed)
 Remote pacemaker transmission.

## 2023-09-26 NOTE — Telephone Encounter (Signed)
 Disability paperwork is going to be placed in Dr. Kin Penner box due to the move to [employer].

## 2023-10-10 ENCOUNTER — Other Ambulatory Visit: Payer: Self-pay

## 2023-10-10 ENCOUNTER — Other Ambulatory Visit: Payer: Self-pay | Admitting: Internal Medicine

## 2023-10-10 DIAGNOSIS — Z87891 Personal history of nicotine dependence: Secondary | ICD-10-CM

## 2023-10-10 DIAGNOSIS — Z122 Encounter for screening for malignant neoplasm of respiratory organs: Secondary | ICD-10-CM

## 2023-10-10 NOTE — Telephone Encounter (Signed)
 Advise as it is not in med list

## 2023-10-12 DIAGNOSIS — J449 Chronic obstructive pulmonary disease, unspecified: Secondary | ICD-10-CM | POA: Diagnosis not present

## 2023-10-24 ENCOUNTER — Encounter: Payer: Self-pay | Admitting: Internal Medicine

## 2023-11-12 ENCOUNTER — Ambulatory Visit: Payer: BC Managed Care – PPO | Admitting: Internal Medicine

## 2023-11-12 ENCOUNTER — Encounter: Payer: Self-pay | Admitting: Internal Medicine

## 2023-11-12 ENCOUNTER — Other Ambulatory Visit: Payer: Self-pay | Admitting: Cardiology

## 2023-11-12 VITALS — BP 120/84 | HR 100 | Temp 98.5°F | Ht 65.0 in | Wt 174.0 lb

## 2023-11-12 DIAGNOSIS — J439 Emphysema, unspecified: Secondary | ICD-10-CM

## 2023-11-12 DIAGNOSIS — I442 Atrioventricular block, complete: Secondary | ICD-10-CM

## 2023-11-12 DIAGNOSIS — J9611 Chronic respiratory failure with hypoxia: Secondary | ICD-10-CM

## 2023-11-12 DIAGNOSIS — R000 Tachycardia, unspecified: Secondary | ICD-10-CM

## 2023-11-12 DIAGNOSIS — Z87891 Personal history of nicotine dependence: Secondary | ICD-10-CM

## 2023-11-12 DIAGNOSIS — J4489 Other specified chronic obstructive pulmonary disease: Secondary | ICD-10-CM

## 2023-11-12 DIAGNOSIS — Z122 Encounter for screening for malignant neoplasm of respiratory organs: Secondary | ICD-10-CM

## 2023-11-12 MED ORDER — BREZTRI AEROSPHERE 160-9-4.8 MCG/ACT IN AERO: 2.0000 | INHALATION_SPRAY | Freq: Two times a day (BID) | RESPIRATORY_TRACT | 11 refills | Status: AC

## 2023-11-12 NOTE — Patient Instructions (Signed)
 It was a pleasure to see you today!  Please schedule follow up with myself in 6 months.  If my schedule is not open yet, we will contact you with a reminder closer to that time. Please call [phone] if you haven't heard from us  a month before, and always call us  sooner if issues or concerns arise. You can also send us  a message through MyChart, but but aware that this is not to be used for urgent issues and it may take up to 5-7 days to receive a reply. Please be aware that you will likely be able to view your results before I have a chance to respond to them. Please give us  5 business days to respond to any non-urgent results.    Glad your breathing is doing well. Continue to stay active! Make sure oxygen  levels are over 88% even when exercising.   Your CT scan from May looks great no signs of lung cancer- you will be scheduled next in may 2026.   Continue the breztri  2 puffs twice daily, gargle after use.  Use albuterol  inhaler as needed.

## 2023-11-12 NOTE — Progress Notes (Signed)
 Christopher Arias    [id number]    11-06-1960  Primary Care Physician:John, Alveda Aures, MD Date of Appointment: 11/12/2023 Established Patient Visit  Chief complaint:   Chief Complaint  Patient presents with   Follow-up    Staying active riding with a bike club, riding about 6-7 miles per day and 10 miles per week.  Uses rescue inhaler during ride.    Gets sob and tired, PCP told him normal for age.     HPI: Christopher Arias is a 63 y.o. man with COPD FEV1 60% of predicted on home oxygen  with frequent exacerbations. History of PSVR Pneumonia December 2023. On oxygen  since then. Had AVR in June 2024 and PPM for CHB. Quit smoking in March 2023.   Interval Updates: Here for follow up.  Doing well. Is in [employer] and rides monthly with a group.   Has not been checking oxygen  saturations while on his bike but has been feeling ok.   No interval exacerbations of COPD.   Still on breztri  2 puffs twice daily, loves it. Minimal to no albuterol  use.  Using home oxygen  nocturnally 2LNC and sometimes during the day Prn.   He is a retired Product/process development scientist.  DME - [employer]   I have reviewed the patient's family social and past medical history and updated as appropriate.   Past Medical History:  Diagnosis Date   ANTERIOR PITUITARY HYPERFUNCTION 11/23/2009   ANXIETY 11/24/2007   COPD (chronic obstructive pulmonary disease) (HCC) 05/31/2021   GERD 11/24/2007   GYNECOMASTIA 11/10/2009   HYPERLIPIDEMIA 11/24/2007   HYPOTHYROIDISM, POST-RADIATION 03/16/2010   PONV (postoperative nausea and vomiting)    Severe aortic insufficiency     Past Surgical History:  Procedure Laterality Date   AORTIC VALVE REPLACEMENT N/A 11/04/2022   Procedure: AORTIC VALVE REPLACEMENT (AVR) USING INSPIRIS RESILIA 27 MM AORTIC VALVE;  Surgeon: Bartley Lightning, MD;  Location: [facility] OR;  Service: Open Heart Surgery;  Laterality: N/A;  Median sternotomy   EXPLORATION POST OPERATIVE OPEN HEART N/A 11/04/2022   Procedure:  EXPLORATION POST OPERATIVE OPEN HEART;  Surgeon: Bartley Lightning, MD;  Location: [facility] OR;  Service: Open Heart Surgery;  Laterality: N/A;   HEMORRHOID SURGERY     INGUINAL HERNIA REPAIR Bilateral    PACEMAKER IMPLANT N/A 11/12/2022   Procedure: PACEMAKER IMPLANT;  Surgeon: Lei Pump, MD;  [facility]: [facility] INVASIVE CV LAB;  Service: Cardiovascular;  Laterality: N/A;   RIGHT/LEFT HEART CATH AND CORONARY ANGIOGRAPHY N/A 08/20/2022   Procedure: RIGHT/LEFT HEART CATH AND CORONARY ANGIOGRAPHY;  Surgeon: Arleen Lacer, MD;  [facility] INVASIVE CV LAB;  Service: Cardiovascular;  Laterality: N/A;   TEE WITHOUT CARDIOVERSION N/A 08/01/2022   Procedure: TRANSESOPHAGEAL ECHOCARDIOGRAM (TEE);  Surgeon: Loyde Rule, MD;  Location: [facility] ENDOSCOPY;  Service: Cardiovascular;  Laterality: N/A;   TEE WITHOUT CARDIOVERSION N/A 11/04/2022   Procedure: TRANSESOPHAGEAL ECHOCARDIOGRAM;  Surgeon: Bartley Lightning, MD;  Location: [facility] OR;  Service: Open Heart Surgery;  Laterality: N/A;    Family History  Problem Relation Age of Onset   Heart disease Father    Stroke Other    COPD Other    Heart disease Other    Colon cancer Neg Hx     Social History   Occupational History    Employer: [employer]  Tobacco Use   Smoking status: Former    Current packs/day: 0.00    Average packs/day: 1 pack/day for 20.0 years (20.0 ttl  pk-yrs)    Types: Cigarettes    Start date: 08/24/2001    Quit date: 08/24/2021    Years since quitting: 2.2   Smokeless tobacco: Never  Vaping Use   Vaping status: Never Used  Substance and Sexual Activity   Alcohol use: Yes    Alcohol/week: 1.0 standard drink of alcohol    Types: 1 Cans of beer per week    Comment: one beer every 2 weeks, maybe 1 mixed drink once a month   Drug use: No   Sexual activity: Not on file     Physical Exam: Blood pressure 120/84, pulse 100, temperature 99.2 F (37.3 C), temperature source Oral, height 5' 5 (1.651 m), weight 174 lb (78.9 kg), SpO2  91%.  Gen:     No distress, well appearing Lungs:   upper lobes diminished, no wheezes or crackles CV:        RRR no mrg   Data Reviewed: Imaging: I have personally reviewed the CT Chest May 2025 - lung rads 2, no nodules or masses.  PFTs:     Latest Ref Rng & Units 08/12/2022    8:52 AM  PFT Results  FVC-Pre L 3.46   FVC-Predicted Pre % 88   FVC-Post L 3.64   FVC-Predicted Post % 92   Pre FEV1/FVC % % 52   Post FEV1/FCV % % 51   FEV1-Pre L 1.79   FEV1-Predicted Pre % 60   FEV1-Post L 1.86   DLCO uncorrected ml/min/mmHg 8.82   DLCO UNC% % 37   DLCO corrected ml/min/mmHg 8.65   DLCO COR %Predicted % 36   DLVA Predicted % 37   TLC L 7.00   TLC % Predicted % 116   RV % Predicted % 149    I have personally reviewed the patient's PFTs and moderate airflow limitation  Labs:  Lab Results  Component Value Date   WBC 7.7 08/19/2023   HGB 15.2 08/19/2023   HCT 47.1 08/19/2023   MCV 83.4 08/19/2023   PLT 185.0 08/19/2023   Lab Results  Component Value Date   NA 141 08/19/2023   K 4.7 08/19/2023   CO2 25 08/19/2023   GLUCOSE 77 08/19/2023   BUN 20 08/19/2023   CREATININE 0.96 08/19/2023   CALCIUM  9.6 08/19/2023   GFR 84.81 08/19/2023   EGFR 84 04/02/2023   GFRNONAA >60 11/13/2022     Immunization status: Immunization History  Administered Date(s) Administered   Fluad Trivalent(High Dose 65+) 04/09/2023   H1N1 07/19/2008   Influenza,inj,Quad PF,6+ Mos 03/12/2013, 06/17/2014, 03/08/2019, 05/30/2021, 05/21/2022   PFIZER(Purple Top)SARS-COV-2 Vaccination 08/30/2019, 09/20/2019   Td 07/19/2008   Tdap 09/13/2012   Zoster Recombinant(Shingrix) 11/27/2020, 01/31/2021    External Records Personally Reviewed: primary care  Assessment:  COPD FEV1 60% of predicted  S/p AVR and PPM for CHB. Chronic respiratory failure on [facility] Need for lung cancer screening  Plan/Recommendations:  Glad your breathing is doing well. Continue to stay active! Make sure oxygen   levels are over 88% even when exercising.   Your CT scan from May looks great no signs of lung cancer- you will be scheduled next in may 2026.   Continue the breztri  2 puffs twice daily, gargle after use.  Use albuterol  inhaler as needed.   Return to Care: Return in about 6 months (around 05/13/2024).   Louie Rover, MD Pulmonary and Critical Care Medicine [facility] Office:[phone]

## 2023-11-13 ENCOUNTER — Other Ambulatory Visit: Payer: Self-pay | Admitting: Internal Medicine

## 2023-11-14 ENCOUNTER — Other Ambulatory Visit: Payer: Self-pay | Admitting: Family Medicine

## 2023-11-14 ENCOUNTER — Ambulatory Visit (INDEPENDENT_AMBULATORY_CARE_PROVIDER_SITE_OTHER): Payer: Self-pay

## 2023-11-14 ENCOUNTER — Other Ambulatory Visit: Payer: Self-pay | Admitting: Internal Medicine

## 2023-11-14 DIAGNOSIS — R000 Tachycardia, unspecified: Secondary | ICD-10-CM

## 2023-11-14 DIAGNOSIS — F32A Depression, unspecified: Secondary | ICD-10-CM

## 2023-11-14 LAB — CUP PACEART REMOTE DEVICE CHECK
Battery Remaining Longevity: 172 mo
Battery Voltage: 3.12 V
Brady Statistic AP VP Percent: 3.54 %
Brady Statistic AP VS Percent: 0.74 %
Brady Statistic AS VP Percent: 0.28 %
Brady Statistic AS VS Percent: 95.44 %
Brady Statistic RA Percent Paced: 4.28 %
Brady Statistic RV Percent Paced: 3.82 %
Date Time Interrogation Session: 20250613064757
Implantable Lead Connection Status: 753985
Implantable Lead Connection Status: 753985
Implantable Lead Implant Date: 20240611
Implantable Lead Implant Date: 20240611
Implantable Lead Location: 753859
Implantable Lead Location: 753860
Implantable Lead Model: 3830
Implantable Lead Model: 5076
Implantable Pulse Generator Implant Date: 20240611
Lead Channel Impedance Value: 342 Ohm
Lead Channel Impedance Value: 418 Ohm
Lead Channel Impedance Value: 532 Ohm
Lead Channel Impedance Value: 589 Ohm
Lead Channel Pacing Threshold Amplitude: 0.75 V
Lead Channel Pacing Threshold Amplitude: 0.875 V
Lead Channel Pacing Threshold Pulse Width: 0.4 ms
Lead Channel Pacing Threshold Pulse Width: 0.4 ms
Lead Channel Sensing Intrinsic Amplitude: 2.25 mV
Lead Channel Sensing Intrinsic Amplitude: 2.25 mV
Lead Channel Sensing Intrinsic Amplitude: 5.875 mV
Lead Channel Sensing Intrinsic Amplitude: 5.875 mV
Lead Channel Setting Pacing Amplitude: 1.5 V
Lead Channel Setting Pacing Amplitude: 2 V
Lead Channel Setting Pacing Pulse Width: 0.4 ms
Lead Channel Setting Sensing Sensitivity: 1.2 mV
Zone Setting Status: 755011

## 2023-11-14 MED ORDER — CLONAZEPAM 1 MG PO TABS: 1.0000 mg | ORAL_TABLET | Freq: Two times a day (BID) | ORAL | 0 refills | Status: DC | PRN

## 2023-11-14 NOTE — Telephone Encounter (Unsigned)
 Copied from CRM [id number]. Topic: Clinical - Medication Refill >> Nov 14, 2023 10:16 AM Jenice Mitts wrote: Medication:clonazePAM  (1 mg Oral 2 times daily PRN)  Has the patient contacted their pharmacy? Yes (Agent: If no, request that the patient contact the pharmacy for the refill. If patient does not wish to contact the pharmacy document the reason why and proceed with request.) (Agent: If yes, when and what did the pharmacy advise?)  This is the patient's preferred pharmacy:  [id number] - [place], [place] - [address] AT [facility] [address] [place] [address] Phone: [phone] Fax: [phone]   Is this the correct pharmacy for this prescription? Yes If no, delete pharmacy and type the correct one.   Has the prescription been filled recently? No  Is the patient out of the medication? No, will last until sunday  Has the patient been seen for an appointment in the last year OR does the patient have an upcoming appointment? Yes  Can we respond through MyChart? No  Agent: Please be advised that Rx refills may take up to 3 business days. We ask that you follow-up with your pharmacy.

## 2023-11-17 ENCOUNTER — Ambulatory Visit: Payer: Self-pay

## 2023-11-17 NOTE — Telephone Encounter (Signed)
 Patient's wife called back-explained that appointment scheduled was actually for July 17th and not June 17th. July 17th appointment to be canceled. Instructed wife that Urgent Care would be the recommendation at this time. She is asking for a phone call from pulmonary staff.

## 2023-11-17 NOTE — Telephone Encounter (Signed)
 Pt states he would like to know what medications can he take to get rid of his bad cough. Pt was not scheduled. Pt was told to go to [facility].

## 2023-11-17 NOTE — Telephone Encounter (Signed)
 LVM on spouse Moira Andrews phone to call back re: scheduling error.

## 2023-11-17 NOTE — Telephone Encounter (Signed)
 What is the scheduling error?

## 2023-11-17 NOTE — Telephone Encounter (Addendum)
 FYI Only or Action Required?: FYI only for provider  Patient is followed in Pulmonology for COPD, last seen on 11/12/2023 by Christopher Hurdle, MD. Called Nurse Triage reporting Breathing Problem. Symptoms began several days ago. Interventions attempted: OTC medications: Day/Nyquil, Rescue inhaler, Maintenance inhaler, and Home oxygen  use. Symptoms are: gradually worsening.  Triage Disposition: See HCP Within 4 Hours (Or PCP Triage)  Patient/caregiver understands and will follow disposition?: Yes   Copied from CRM [id number]. Topic: Clinical - Red Word Triage >> Nov 17, 2023  3:26 PM Tyronne Galloway wrote: Red Word that prompted transfer to Nurse Triage: Pt is coughing, wheezing, sob, and has to use rescue inhaler more than usual the last few days. Pt needs an appt nothing scheduled yet. Reason for Disposition  [1] Longstanding difficulty breathing (e.g., CHF, COPD, emphysema) AND [2] WORSE than normal  Answer Assessment - Initial Assessment Questions E2C2 Pulmonary Triage - Initial Assessment Questions Chief Complaint (e.g., cough, sob, wheezing, fever, chills, sweat or additional symptoms) *Go to specific symptom protocol after initial questions. Had a cold the last couple of days Wheezing, dry coughing a lot  How long have symptoms been present? Past few days  Have you tested for COVID or Flu? Note: If not, ask patient if a home test can be taken. If so, instruct patient to call back for positive results. No  MEDICINES:   Have you used any OTC meds to help with symptoms? Yes If yes, ask What medications? Nyquil/Dayquil - minimal relief  Have you used your inhalers/maintenance medication? Yes If yes, What medications? Breztri  - twice a day Albuterol  PRN - has used 2-3x today  If inhaler, ask How many puffs and how often? Note: Review instructions on medication in the chart. See above  OXYGEN : Do you wear supplemental oxygen ? Yes If yes, How many liters are you  supposed to use? 2-3L nighttime/PRN - has been using during day d/t illness  Do you monitor your oxygen  levels? Yes If yes, What is your reading (oxygen  level) today? 90-92 on 2.5 L  What is your usual oxygen  saturation reading?  (Note: Pulmonary O2 sats should be 90% or greater) 93-94   1. RESPIRATORY STATUS: Describe your breathing? (e.g., wheezing, shortness of breath, unable to speak, severe coughing)      Coughing, SOB/wheeze 2. ONSET: When did this breathing problem begin?      See above 3. PATTERN Does the difficult breathing come and go, or has it been constant since it started?      constant 4. SEVERITY: How bad is your breathing? (e.g., mild, moderate, severe)    - MILD: No SOB at rest, mild SOB with walking, speaks normally in sentences, can lie down, no retractions, pulse < 100.    - MODERATE: SOB at rest, SOB with minimal exertion and prefers to sit, cannot lie down flat, speaks in phrases, mild retractions, audible wheezing, pulse 100-120.    - SEVERE: Very SOB at rest, speaks in single words, struggling to breathe, sitting hunched forward, retractions, pulse > 120      Mild-moderate - has been requiring O2 during day per wife Triager did hear pt in the background and did not appreciate audible SOB/wheezing during call. Pt is speaking in partial-full sentences.  5. RECURRENT SYMPTOM: Have you had difficulty breathing before? If Yes, ask: When was the last time? and What happened that time?      unknown 6. CARDIAC HISTORY: Do you have any history of heart disease? (e.g., heart attack,  angina, bypass surgery, angioplasty)      Hx heart valve replacement last June, and pacemaker 7. LUNG HISTORY: Do you have any history of lung disease?  (e.g., pulmonary embolus, asthma, emphysema)     COPD 8. CAUSE: What do you think is causing the breathing problem?      Cold 9. OTHER SYMPTOMS: Do you have any other symptoms? (e.g., dizziness, runny nose,  cough, chest pain, fever)     Denies, just cough 10. O2 SATURATION MONITOR:  Do you use an oxygen  saturation monitor (pulse oximeter) at home? If Yes, ask: What is your reading (oxygen  level) today? What is your usual oxygen  saturation reading? (e.g., 95%)       See above 11. PREGNANCY: Is there any chance you are pregnant? When was your last menstrual period?       N/a 12. TRAVEL: Have you traveled out of the country in the last month? (e.g., travel history, exposures)       N/a  Protocols used: Breathing Difficulty-A-AH

## 2023-11-18 ENCOUNTER — Ambulatory Visit: Payer: Self-pay | Admitting: Cardiology

## 2023-11-21 ENCOUNTER — Ambulatory Visit: Payer: Self-pay

## 2023-11-21 NOTE — Telephone Encounter (Addendum)
 Third attempt, LVM to return call to [phone]   Second attempt, LVM to return call to [phone]    First attempt, LVM to return call to [phone]   Copied from CRM [id number]. Topic: Clinical - Medication Question >> Nov 21, 2023  2:27 PM Chuck Crater wrote: Reason for CRM: Patient wife stated that husband is coughing constantly and can't rest at night due to coughing and wants to if he can get some cough medicine. He has been suffering from this cough for about a week and has been self medicating. [facility] [address]

## 2023-11-24 ENCOUNTER — Other Ambulatory Visit: Payer: Self-pay | Admitting: Internal Medicine

## 2023-11-24 MED ORDER — PROMETHAZINE-DM 6.25-15 MG/5ML PO SYRP: 5.0000 mL | ORAL_SOLUTION | Freq: Four times a day (QID) | ORAL | 0 refills | Status: AC | PRN

## 2023-11-24 NOTE — Telephone Encounter (Signed)
 Called and let Pt know

## 2023-11-24 NOTE — Telephone Encounter (Signed)
 Ok I have sent promethazine  DM - done erx

## 2023-12-12 DIAGNOSIS — J449 Chronic obstructive pulmonary disease, unspecified: Secondary | ICD-10-CM | POA: Diagnosis not present

## 2023-12-14 ENCOUNTER — Other Ambulatory Visit: Payer: Self-pay | Admitting: Family Medicine

## 2023-12-14 DIAGNOSIS — F419 Anxiety disorder, unspecified: Secondary | ICD-10-CM

## 2023-12-18 ENCOUNTER — Ambulatory Visit: Payer: Self-pay | Admitting: Cardiology

## 2023-12-18 ENCOUNTER — Ambulatory Visit: Payer: Self-pay | Admitting: Nurse Practitioner

## 2023-12-18 ENCOUNTER — Ambulatory Visit (HOSPITAL_COMMUNITY)
Admission: RE | Admit: 2023-12-18 | Discharge: 2023-12-18 | Disposition: A | Discharge status: Home / Self Care | Source: Ambulatory Visit | Attending: Cardiology | Admitting: Cardiology

## 2023-12-18 DIAGNOSIS — I359 Nonrheumatic aortic valve disorder, unspecified: Secondary | ICD-10-CM

## 2023-12-18 DIAGNOSIS — Z952 Presence of prosthetic heart valve: Secondary | ICD-10-CM | POA: Diagnosis not present

## 2023-12-18 LAB — ECHOCARDIOGRAM COMPLETE
AR max vel: 2.21 cm2
AV Area VTI: 2.79 cm2
AV Area mean vel: 2.38 cm2
AV Mean grad: 6 mmHg
AV Peak grad: 9.6 mmHg
Ao pk vel: 1.55 m/s
Area-P 1/2: 3.48 cm2
S' Lateral: 2.4 cm

## 2024-01-01 NOTE — Progress Notes (Signed)
 Remote pacemaker transmission.

## 2024-01-12 DIAGNOSIS — J449 Chronic obstructive pulmonary disease, unspecified: Secondary | ICD-10-CM | POA: Diagnosis not present

## 2024-01-30 ENCOUNTER — Encounter: Payer: Self-pay | Admitting: Cardiology

## 2024-02-08 ENCOUNTER — Other Ambulatory Visit: Payer: Self-pay | Admitting: Cardiology

## 2024-02-08 DIAGNOSIS — R000 Tachycardia, unspecified: Secondary | ICD-10-CM

## 2024-02-08 DIAGNOSIS — I442 Atrioventricular block, complete: Secondary | ICD-10-CM

## 2024-02-09 ENCOUNTER — Other Ambulatory Visit: Payer: Self-pay | Admitting: Internal Medicine

## 2024-02-10 ENCOUNTER — Other Ambulatory Visit: Payer: Self-pay | Admitting: Internal Medicine

## 2024-02-10 DIAGNOSIS — F32A Depression, unspecified: Secondary | ICD-10-CM

## 2024-02-10 NOTE — Telephone Encounter (Signed)
 Copied from CRM #[id number]. Topic: Clinical - Medication Refill >> Feb 10, 2024  4:37 PM Taleah C wrote: Medication: clonazepam    Has the patient contacted their pharmacy? Yes They didn't send the request  This is the patient's preferred pharmacy:  [id number] - [place], [place] - [address] AT [facility] [address] [place] [address] Phone: [phone] Fax: [phone]  [email or web address] - [facility] - [place], [place] - [address] [place] [address] Phone: [phone] Fax: [phone]  Is this the correct pharmacy for this prescription? Yes If no, delete pharmacy and type the correct one.   Has the prescription been filled recently? Yes  Is the patient out of the medication? Yes  Has the patient been seen for an appointment in the last year OR does the patient have an upcoming appointment? No  Can we respond through MyChart? Yes  Agent: Please be advised that Rx refills may take up to 3 business days. We ask that you follow-up with your pharmacy.

## 2024-02-11 MED ORDER — CLONAZEPAM 1 MG PO TABS: 1.0000 mg | ORAL_TABLET | Freq: Two times a day (BID) | ORAL | 2 refills | Status: DC | PRN

## 2024-02-12 DIAGNOSIS — J449 Chronic obstructive pulmonary disease, unspecified: Secondary | ICD-10-CM | POA: Diagnosis not present

## 2024-02-13 ENCOUNTER — Encounter: Payer: Self-pay | Admitting: Pulmonary Disease

## 2024-02-13 ENCOUNTER — Ambulatory Visit: Attending: Pulmonary Disease | Admitting: Pulmonary Disease

## 2024-02-13 ENCOUNTER — Encounter: Payer: Self-pay | Admitting: Emergency Medicine

## 2024-02-13 ENCOUNTER — Ambulatory Visit: Attending: Emergency Medicine | Admitting: Emergency Medicine

## 2024-02-13 ENCOUNTER — Ambulatory Visit (INDEPENDENT_AMBULATORY_CARE_PROVIDER_SITE_OTHER): Payer: Self-pay

## 2024-02-13 VITALS — BP 105/76 | HR 92 | Ht 65.0 in | Wt 175.8 lb

## 2024-02-13 VITALS — BP 105/76 | HR 85 | Ht 65.0 in | Wt 175.0 lb

## 2024-02-13 DIAGNOSIS — E782 Mixed hyperlipidemia: Secondary | ICD-10-CM | POA: Diagnosis not present

## 2024-02-13 DIAGNOSIS — I4719 Other supraventricular tachycardia: Secondary | ICD-10-CM

## 2024-02-13 DIAGNOSIS — Z952 Presence of prosthetic heart valve: Secondary | ICD-10-CM

## 2024-02-13 DIAGNOSIS — R000 Tachycardia, unspecified: Secondary | ICD-10-CM | POA: Diagnosis not present

## 2024-02-13 DIAGNOSIS — I442 Atrioventricular block, complete: Secondary | ICD-10-CM

## 2024-02-13 DIAGNOSIS — Z950 Presence of cardiac pacemaker: Secondary | ICD-10-CM | POA: Diagnosis not present

## 2024-02-13 LAB — CUP PACEART REMOTE DEVICE CHECK
Battery Remaining Longevity: 168 mo
Battery Voltage: 3.08 V
Brady Statistic AP VP Percent: 7.39 %
Brady Statistic AP VS Percent: 1.21 %
Brady Statistic AS VP Percent: 1.4 %
Brady Statistic AS VS Percent: 89.99 %
Brady Statistic RA Percent Paced: 8.6 %
Brady Statistic RV Percent Paced: 8.8 %
Date Time Interrogation Session: 20250912013144
Implantable Lead Connection Status: 753985
Implantable Lead Connection Status: 753985
Implantable Lead Implant Date: 20240611
Implantable Lead Implant Date: 20240611
Implantable Lead Location: 753859
Implantable Lead Location: 753860
Implantable Lead Model: 3830
Implantable Lead Model: 5076
Implantable Pulse Generator Implant Date: 20240611
Lead Channel Impedance Value: 361 Ohm
Lead Channel Impedance Value: 437 Ohm
Lead Channel Impedance Value: 551 Ohm
Lead Channel Impedance Value: 589 Ohm
Lead Channel Pacing Threshold Amplitude: 0.875 V
Lead Channel Pacing Threshold Amplitude: 0.875 V
Lead Channel Pacing Threshold Pulse Width: 0.4 ms
Lead Channel Pacing Threshold Pulse Width: 0.4 ms
Lead Channel Sensing Intrinsic Amplitude: 1.875 mV
Lead Channel Sensing Intrinsic Amplitude: 1.875 mV
Lead Channel Sensing Intrinsic Amplitude: 10.625 mV
Lead Channel Sensing Intrinsic Amplitude: 10.625 mV
Lead Channel Setting Pacing Amplitude: 1.5 V
Lead Channel Setting Pacing Amplitude: 2 V
Lead Channel Setting Pacing Pulse Width: 0.4 ms
Lead Channel Setting Sensing Sensitivity: 1.2 mV
Zone Setting Status: 755011

## 2024-02-13 LAB — CUP PACEART INCLINIC DEVICE CHECK
Date Time Interrogation Session: 20250912170537
Implantable Lead Connection Status: 753985
Implantable Lead Connection Status: 753985
Implantable Lead Implant Date: 20240611
Implantable Lead Implant Date: 20240611
Implantable Lead Location: 753859
Implantable Lead Location: 753860
Implantable Lead Model: 3830
Implantable Lead Model: 5076
Implantable Pulse Generator Implant Date: 20240611

## 2024-02-13 MED ORDER — METOPROLOL SUCCINATE ER 50 MG PO TB24: 50.0000 mg | ORAL_TABLET | Freq: Every day | ORAL | 4 refills | Status: AC

## 2024-02-13 NOTE — Patient Instructions (Signed)
 Medication Instructions:  NO CHANGES  Lab Work: NONE TO BE DONE TODAY.  Testing/Procedures: NONE  Follow-Up: At [facility], you and your health needs are our priority.  As part of our continuing mission to provide you with exceptional heart care, our providers are all part of one team.  This team includes your primary Cardiologist (physician) and Advanced Practice Providers or APPs (Physician Assistants and Nurse Practitioners) who all work together to provide you with the care you need, when you need it.  Your next appointment:   1 YEAR  Provider:   Lonni LITTIE Nanas, MD OR [facility].

## 2024-02-13 NOTE — Patient Instructions (Signed)
 Medication Instructions:  Your physician recommends that you continue on your current medications as directed. Please refer to the Current Medication list given to you today.  *If you need a refill on your cardiac medications before your next appointment, please call your pharmacy*  Lab Work: None ordered If you have labs (blood work) drawn today and your tests are completely normal, you will receive your results only by: MyChart Message (if you have MyChart) OR A paper copy in the mail If you have any lab test that is abnormal or we need to change your treatment, we will call you to review the results.  Follow-Up: At [facility], you and your health needs are our priority.  As part of our continuing mission to provide you with exceptional heart care, our providers are all part of one team.  This team includes your primary Cardiologist (physician) and Advanced Practice Providers or APPs (Physician Assistants and Nurse Practitioners) who all work together to provide you with the care you need, when you need it.  Your next appointment:   1 year(s)  Provider:   Soyla Norton, MD or Daphne Barrack, NP

## 2024-02-13 NOTE — Progress Notes (Signed)
 Cardiology Office Note:    Date:  02/13/2024  ID:  [name], DOB July 22, 1960, MRN [id number] PCP: Norleen Lynwood ORN, MD  [facility] Providers Cardiologist:  Lonni LITTIE Nanas, MD Electrophysiologist:  Soyla Gladis Norton, MD       Patient Profile:       Chief Complaint: 1 year follow-up History of Present Illness:  Christopher Arias is a 63 y.o. male with visit-pertinent history of COPD on home 3L, hypothyroidism, hyperlipidemia, OSA, rectus sheath hematoma  He was referred to cardiology service for evaluation of possible atrial flutter, initially seen 07/15/2022.  EKG 06/21/2022 read as atrial flutter but appears sinus rhythm.  Echocardiogram 07/11/2022 showed LVEF 60 to 65%, grade 1 DD, normal RV function, mild to moderate AI, possible small echodensity aortic valve on subcostal images.  He reported he has been having shortness of breath since his hospitalization in December 2023 with acute hypoxic respiratory failure in the setting of RSV infection.  TEE on 08/01/2022 showed LVEF 60 to 65%, mild LV dilation, normal RV function, mild left atrial enlargement, bicuspid aortic valve with severe aortic regurgitation, dilated aortic root measuring 40 mm.  L/RHC on 08/1918 24 showed no significant CAD, normal filling pressures.  Referred to cardiothoracic surgery and underwent aortic valve replacement with 27 mm Edwards Inspiris Resilia valve with Dr. Lucas on 11/04/2022.  Developed complete heart block after procedure, underwent PPM placement 11/12/2022.  Also had atrial tachycardia and started on digoxin .  Echocardiogram 12/16/2022 showed LVEF 55 to 60%, grade 1 DD, mildly reduced RV function, normal functioning bioprosthetic aortic valve, mild dilation of aortic root measuring 42 mm.  He was last seen in clinic on 04/02/2023.  He was doing well at the time denied any exertional symptoms.  He was doing cardiac rehab without limitation.  He was riding exercise bike 10 miles a day.  No changes were  made.  He is to follow-up in 6 months.  Echocardiogram 12/18/2023 showed LVEF 60 to 65%, no RWMA, normal diastolic parameters, RV function and size normal, normal functioning bioprosthetic aortic valve.   Discussed the use of AI scribe software for clinical note transcription with the patient, who gave verbal consent to proceed.  History of Present Illness Christopher Arias is a 63 year old male with aortic valve replacement and complete heart block who presents for follow-up of his cardiovascular health.  Today he is doing well overall.  He denies any acute complaints.  He remains active, using an electric bike and walking frequently, but requires oxygen  with overexertion. Oxygen  use began in December 2023 after an RSV infection, with no improvement noted since. He uses two liters of oxygen .  He experiences anxiety and irritability, which he attributes to stress. No chest pain, acute respiratory distress, syncope, orthopnea, PND, lightheadedness, dizziness, leg swelling, or significant weight gain. Shortness of breath is controlled with oxygen  during exertion.  Review of systems:  Please see the history of present illness. All other systems are reviewed and otherwise negative.      Studies Reviewed:        Echocardiogram 12/18/2023  1. Left ventricular ejection fraction, by estimation, is 60 to 65%. Left  ventricular ejection fraction by 3D volume is 63 %. The left ventricle has  normal function. The left ventricle has no regional wall motion  abnormalities. Left ventricular diastolic   parameters were normal.   2. Right ventricular systolic function is normal. The right ventricular  size is normal.   3. The  mitral valve is normal in structure. No evidence of mitral valve  regurgitation. No evidence of mitral stenosis.   4. The aortic valve has been repaired/replaced. Aortic valve  regurgitation is not visualized. No aortic stenosis is present. There is a  27 mm Edwards Inspiris Resilia  valve present in the aortic position.  Procedure Date: 11/04/2022. Aortic valve mean  gradient measures 6.0 mmHg. Aortic valve Vmax measures 1.55 m/s.   5. The inferior vena cava is normal in size with greater than 50%  respiratory variability, suggesting right atrial pressure of 3 mmHg.   Echocardiogram 12/16/2022  1. Left ventricular ejection fraction, by estimation, is 55 to 60%. The  left ventricle has normal function. The left ventricle has no regional  wall motion abnormalities. There is mild concentric left ventricular  hypertrophy. Left ventricular diastolic  parameters are consistent with Grade I diastolic dysfunction (impaired  relaxation).   2. Right ventricular systolic function is mildly reduced. The right  ventricular size is normal. Tricuspid regurgitation signal is inadequate  for assessing PA pressure.   3. The mitral valve is normal in structure. Trivial mitral valve  regurgitation. No evidence of mitral stenosis.   4. Status post bioprosthetic aortic valve replacement. No significant  perivalvular leakage. Mean gradient 4 mmHg suggests no significant  stenosis.   5. Aortic dilatation noted. There is mild dilatation of the aortic root,  measuring 42 mm.   6. The inferior vena cava is normal in size with greater than 50%  respiratory variability, suggesting right atrial pressure of 3 mmHg.   Risk Assessment/Calculations:              Physical Exam:   VS:  BP 105/76   Pulse 85   Ht 5' 5 (1.651 m)   Wt 175 lb (79.4 kg)   SpO2 93%   BMI 29.12 kg/m    Wt Readings from Last 3 Encounters:  02/13/24 175 lb (79.4 kg)  02/13/24 175 lb 12.8 oz (79.7 kg)  11/12/23 174 lb (78.9 kg)    GEN: Well nourished, well developed in no acute distress NECK: No JVD; No carotid bruits CARDIAC: RRR, no murmurs, rubs, gallops RESPIRATORY:  Clear to auscultation without rales, wheezing or rhonchi  ABDOMEN: Soft, non-tender, non-distended EXTREMITIES:  No edema; No acute  deformity      Assessment and Plan:  Aortic regurgitation s/p AVR TEE 07/2022 showed bicuspid aortic valve with severe aortic regurgitation S/p aortic valve replacement with 27 mm Edwards Inspiris Resilia valve with Dr. Lucas on 11/04/2022  Echocardiogram 12/2023 with LVEF 60 to 65% and normal functioning bioprosthetic aortic valve - Today he is without exertional chest pains, syncope/presyncope, lightheadedness/dizziness.  Chronic dyspnea well-controlled on as needed O2 on exertion.  Rides e-bike without any limitations or symptoms - Most recent echocardiogram shows normal functioning bioprosthetic AV - Remain stable.  No further interventions at this time  Complete heart block Developed after AVR S/p PPM - Followed by EP for device management  Hyperlipidemia LDL 94 on 08/2023 - Continue atorvastatin  20 mg day  Atrial tachycardia Developed after AVR Recent device interrogation showed minimal atrial tachycardia - Continue metoprolol  succinate 50 mg daily - Followed by EP       Dispo:  Return in about 1 year (around 02/12/2025).  Signed, Lum LITTIE Louis, NP

## 2024-02-13 NOTE — Progress Notes (Signed)
  Electrophysiology Office Note:   Date:  02/13/2024  ID:  Christopher Arias, DOB June 09, 1960, MRN [id number]  Primary Cardiologist: Lonni LITTIE Nanas, MD Primary Heart Failure: None Electrophysiologist: Will Gladis Norton, MD       History of Present Illness:   Christopher Arias is a 63 y.o. male with h/o CHB s/p PPM, HLD, severe AI s/p AVR, OSA (untreated), COPD seen today for routine electrophysiology followup.   Since last being seen in our clinic the patient reports doing well. He jokes that he is here with his second wife. He reports no device related concerns. Relays details from his hospital stay for his valve surgery.  His friend asks if his surgery could contribute to short term memory loss.      He denies chest pain, palpitations, dyspnea, PND, orthopnea, nausea, vomiting, dizziness, syncope, edema, weight gain, or early satiety.   Review of systems complete and found to be negative unless listed in HPI.   EP Information / Studies Reviewed:    EKG is ordered today. Personal review as below.  EKG Interpretation Date/Time:  Friday February 13 2024 14:07:27 EDT Ventricular Rate:  85 PR Interval:  210 QRS Duration:  72 QT Interval:  340 QTC Calculation: 404 R Axis:   257  Text Interpretation: Sinus rhythm with 1st degree A-V block Confirmed by Aniceto Jarvis ([id number]) on 02/13/2024 3:00:17 PM   PPM Interrogation-  reviewed in detail today,  See PACEART report.  Device History: Medtronic Dual Chamber PPM implanted 11/12/22 for CHB  Risk Assessment/Calculations:              Physical Exam:   VS:  BP 105/76   Pulse 92   Ht 5' 5 (1.651 m)   Wt 175 lb 12.8 oz (79.7 kg)   SpO2 92%   BMI 29.25 kg/m    Wt Readings from Last 3 Encounters:  02/13/24 175 lb (79.4 kg)  02/13/24 175 lb 12.8 oz (79.7 kg)  11/12/23 174 lb (78.9 kg)     GEN: Well nourished, well developed in no acute distress NECK: No JVD; No carotid bruits CARDIAC: Regular rate and rhythm, no murmurs, rubs,  gallops. PPM site wnl, no tethering.  RESPIRATORY:  Clear to auscultation without rales, wheezing or rhonchi  ABDOMEN: Soft, non-tender, non-distended EXTREMITIES:  No edema; No deformity   ASSESSMENT AND PLAN:    CHB s/p Medtronic PPM  -Normal PPM function -See Pace Art report -No programming changes today -minimal A/V pacing   Atrial Tachycardia / Sinus Tachycardia  -few episodes of Fast A&V on device > EGM review shows AT  -monitor burden, on Toprol  50 mg daily   AI s/p AVR  -per Cardiology   Disposition:   Follow up with Dr. Norton in 12 months  Signed, Jarvis Aniceto, NP-C, AGACNP-BC [facility] - Electrophysiology  02/13/2024, 5:09 PM

## 2024-02-17 ENCOUNTER — Ambulatory Visit: Payer: Self-pay | Admitting: Cardiology

## 2024-02-20 NOTE — Progress Notes (Signed)
 Remote PPM Transmission

## 2024-02-22 ENCOUNTER — Other Ambulatory Visit: Payer: Self-pay | Admitting: Primary Care

## 2024-03-12 ENCOUNTER — Other Ambulatory Visit: Payer: Self-pay | Admitting: Internal Medicine

## 2024-03-12 DIAGNOSIS — F32A Depression, unspecified: Secondary | ICD-10-CM

## 2024-03-12 NOTE — Telephone Encounter (Signed)
 Copied from CRM [id number]. Topic: Clinical - Medication Refill >> Mar 12, 2024 12:04 PM Viola F wrote: Medication: clonazePAM  (KLONOPIN ) 1 MG tablet [[id number]]  Has the patient contacted their pharmacy? Yes (Agent: If no, request that the patient contact the pharmacy for the refill. If patient does not wish to contact the pharmacy document the reason why and proceed with request.) (Agent: If yes, when and what did the pharmacy advise?)  This is the patient's preferred pharmacy:  [id number] [place], [place] - [address] AT [facility] [address] [place] [address] Phone: [phone] Fax: [phone]  [email or web address] - [facility] - [place], [place] - [address] [place] [address] Phone: [phone] Fax: [phone]  Is this the correct pharmacy for this prescription? Yes If no, delete pharmacy and type the correct one.   Has the prescription been filled recently? Yes  Is the patient out of the medication? No, patient will ran out   Has the patient been seen for an appointment in the last year OR does the patient have an upcoming appointment? Yes  Can we respond through MyChart? Yes  Agent: Please be advised that Rx refills may take up to 3 business days. We ask that you follow-up with your pharmacy.

## 2024-03-13 DIAGNOSIS — J449 Chronic obstructive pulmonary disease, unspecified: Secondary | ICD-10-CM | POA: Diagnosis not present

## 2024-04-06 ENCOUNTER — Encounter: Payer: Self-pay | Admitting: Internal Medicine

## 2024-04-06 ENCOUNTER — Ambulatory Visit: Admitting: Internal Medicine

## 2024-04-06 VITALS — BP 123/85 | HR 75 | Temp 98.1°F | Ht 65.0 in | Wt 180.0 lb

## 2024-04-06 DIAGNOSIS — J439 Emphysema, unspecified: Secondary | ICD-10-CM

## 2024-04-06 DIAGNOSIS — Z122 Encounter for screening for malignant neoplasm of respiratory organs: Secondary | ICD-10-CM | POA: Diagnosis not present

## 2024-04-06 DIAGNOSIS — J9611 Chronic respiratory failure with hypoxia: Secondary | ICD-10-CM

## 2024-04-06 DIAGNOSIS — J4489 Other specified chronic obstructive pulmonary disease: Secondary | ICD-10-CM | POA: Diagnosis not present

## 2024-04-06 NOTE — Progress Notes (Signed)
 Christopher Arias    [id number]    [birth year]  Primary Care Physician:John, Lynwood ORN, MD Date of Appointment: 04/06/2024 Established Patient Visit  Chief complaint:   Chief Complaint  Patient presents with   COPD    Patients states breathing is worse some days.     HPI: Christopher Arias is a 63 y.o. man with COPD FEV1 60% of predicted on home oxygen  with frequent exacerbations. History of PSVR Pneumonia December 2023. On oxygen  since then. Had AVR in June 2024 and PPM for CHB. Quit smoking in March 2023.   Interval Updates: Here for follow up. Having worsening breathing issues with exertion, especially with weather changes, cold weather.  Hasn't been riding his E-bike as often.   Still on breztri  2 puffs twice a day.   No interval exacerbations of COPD.   Having some concerns about his mood. Specifically he is anxious, maybe even depressed. Gets worried thinking about his previous hospitalizations with heart failure replacement, heart block, pneumonia.   Concerned about his wife's declining health.   His decrease in activity level. Worsening dyspnea with daily routine. Not as active as he once was.   He is a retired product/process development scientist.  DME - [employer]   I have reviewed the patient's family social and past medical history and updated as appropriate.   Past Medical History:  Diagnosis Date   ANTERIOR PITUITARY HYPERFUNCTION 11/23/2009   ANXIETY 11/24/2007   COPD (chronic obstructive pulmonary disease) (HCC) 05/31/2021   GERD 11/24/2007   GYNECOMASTIA 11/10/2009   HYPERLIPIDEMIA 11/24/2007   HYPOTHYROIDISM, POST-RADIATION 03/16/2010   PONV (postoperative nausea and vomiting)    Severe aortic insufficiency     Past Surgical History:  Procedure Laterality Date   AORTIC VALVE REPLACEMENT N/A 11/04/2022   Procedure: AORTIC VALVE REPLACEMENT (AVR) USING INSPIRIS RESILIA 27 MM AORTIC VALVE;  Surgeon: Lucas Dorise POUR, MD;  Location: [facility] OR;  Service: Open Heart Surgery;  Laterality:  N/A;  Median sternotomy   EXPLORATION POST OPERATIVE OPEN HEART N/A 11/04/2022   Procedure: EXPLORATION POST OPERATIVE OPEN HEART;  Surgeon: Lucas Dorise POUR, MD;  Location: [facility] OR;  Service: Open Heart Surgery;  Laterality: N/A;   HEMORRHOID SURGERY     INGUINAL HERNIA REPAIR Bilateral    PACEMAKER IMPLANT N/A 11/12/2022   Procedure: PACEMAKER IMPLANT;  Surgeon: Inocencio Soyla Lunger, MD;  Location: [facility] INVASIVE CV LAB;  Service: Cardiovascular;  Laterality: N/A;   RIGHT/LEFT HEART CATH AND CORONARY ANGIOGRAPHY N/A 08/20/2022   Procedure: RIGHT/LEFT HEART CATH AND CORONARY ANGIOGRAPHY;  Surgeon: Anner Alm ORN, MD;  Location[facility] INVASIVE CV LAB;  Service: Cardiovascular;  Laterality: N/A;   TEE WITHOUT CARDIOVERSION N/A 08/01/2022   Procedure: TRANSESOPHAGEAL ECHOCARDIOGRAM (TEE);  Surgeon: Delford Maude BROCKS, MD;  Location: [facility] ENDOSCOPY;  Service: Cardiovascular;  Laterality: N/A;   TEE WITHOUT CARDIOVERSION N/A 11/04/2022   Procedure: TRANSESOPHAGEAL ECHOCARDIOGRAM;  Surgeon: Lucas Dorise POUR, MD;  Location: [facility] OR;  Service: Open Heart Surgery;  Laterality: N/A;    Family History  Problem Relation Age of Onset   Heart disease Father    Stroke Other    COPD Other    Heart disease Other    Colon cancer Neg Hx     Social History   Occupational History    Employer: [employer]  Tobacco Use   Smoking status: Former    Current packs/day: 0.00    Average packs/day: 1 pack/day for 20.0 years (20.0 ttl pk-yrs)  Types: Cigarettes    Start date: 08/24/2001    Quit date: 08/24/2021    Years since quitting: 2.6   Smokeless tobacco: Never  Vaping Use   Vaping status: Never Used  Substance and Sexual Activity   Alcohol use: Yes    Alcohol/week: 1.0 standard drink of alcohol    Types: 1 Cans of beer per week    Comment: one beer every 2 weeks, maybe 1 mixed drink once a month   Drug use: No   Sexual activity: Not on file     Physical Exam: Blood pressure 123/85, pulse 75, temperature 98.1 F  (36.7 C), temperature source Oral, height 5' 5 (1.651 m), weight 180 lb (81.6 kg), SpO2 94%.  Gen:     No distress, obese but well appearing Lungs:   diminished, no wheezes or crackles CV:     RRR no murmur, PPM present Ext: no edema Psych: anxious affect   Data Reviewed: Imaging: I have personally reviewed the CT Chest May 2025 - lung rads 2, no nodules or masses.  PFTs:     Latest Ref Rng & Units 08/12/2022    8:52 AM  PFT Results  FVC-Pre L 3.46   FVC-Predicted Pre % 88   FVC-Post L 3.64   FVC-Predicted Post % 92   Pre FEV1/FVC % % 52   Post FEV1/FCV % % 51   FEV1-Pre L 1.79   FEV1-Predicted Pre % 60   FEV1-Post L 1.86   DLCO uncorrected ml/min/mmHg 8.82   DLCO UNC% % 37   DLCO corrected ml/min/mmHg 8.65   DLCO COR %Predicted % 36   DLVA Predicted % 37   TLC L 7.00   TLC % Predicted % 116   RV % Predicted % 149    I have personally reviewed the patient's PFTs and moderate airflow limitation  Labs:  Lab Results  Component Value Date   WBC 7.7 08/19/2023   HGB 15.2 08/19/2023   HCT 47.1 08/19/2023   MCV 83.4 08/19/2023   PLT 185.0 08/19/2023   Lab Results  Component Value Date   NA 141 08/19/2023   K 4.7 08/19/2023   CO2 25 08/19/2023   GLUCOSE 77 08/19/2023   BUN 20 08/19/2023   CREATININE 0.96 08/19/2023   CALCIUM  9.6 08/19/2023   GFR 84.81 08/19/2023   EGFR 84 04/02/2023   GFRNONAA >60 11/13/2022     Immunization status: Immunization History  Administered Date(s) Administered   Fluad Trivalent(High Dose 65+) 04/09/2023   H1N1 07/19/2008   Influenza,inj,Quad PF,6+ Mos 03/12/2013, 06/17/2014, 03/08/2019, 05/30/2021, 05/21/2022   PFIZER(Purple Top)SARS-COV-2 Vaccination 08/30/2019, 09/20/2019   Td 07/19/2008   Tdap 09/13/2012   Zoster Recombinant(Shingrix) 11/27/2020, 01/31/2021    External Records Personally Reviewed: primary care, cardiology  Assessment:  COPD FEV1 60% of predicted  S/p AVR and PPM for CHB. Chronic respiratory failure  on 2LNC Need for lung cancer screening Anxiety and depression  Plan/Recommendations:  Glad your breathing is doing well. Continue to stay active! Make sure oxygen  levels are over 88% even when exercising.   Your CT scan from May looks great no signs of lung cancer- you will be scheduled next in may 2026.   Continue the breztri  2 puffs twice daily, gargle after use.  Use albuterol  inhaler as needed.   I am referring to pulmonary rehab  I spent an additional 20 minutes after the visit discussing the patient's anxiety and depression related to medical trauma and dyspnea. Also with his wife's health  declining. Recommend follow up with primary care and establishing with psychiatry and CBT.    Return to Care: Return in about 4 months (around 08/04/2024) for Tammy Parrett.  I spent 40 minutes in the care of this patient today including pre-charting, chart review, review of results, face-to-face care, coordination of care and communication with consultants etc.).   Verdon Gore, MD Pulmonary and Critical Care Medicine [facility] [phone]

## 2024-04-06 NOTE — Patient Instructions (Addendum)
 It was a pleasure to see you today!  Please schedule follow up with Tammy Parret in 4 months.  If my schedule is not open yet, we will contact you with a reminder closer to that time. Please call [phone] if you haven't heard from us  a month before, and always call us  sooner if issues or concerns arise. You can also send us  a message through MyChart, but but aware that this is not to be used for urgent issues and it may take up to 5-7 days to receive a reply. Please be aware that you will likely be able to view your results before I have a chance to respond to them. Please give us  5 business days to respond to any non-urgent results.    Continue the Breztri  2 puffs twice a day, gargle after use.  Make sure oxygen  levels are over 88% even when exercising.   Your CT scan from May looks great no signs of lung cancer- you will be scheduled next in may 2026.  Continue to stay active. I am referring you to pulmonary rehab. They will call you to schedule.   Follow up with Dr. Norleen for the anxiety. Might be good to establish with psychologist as well.

## 2024-04-07 ENCOUNTER — Encounter (HOSPITAL_COMMUNITY): Payer: Self-pay

## 2024-04-08 ENCOUNTER — Telehealth: Payer: Self-pay

## 2024-04-08 NOTE — Telephone Encounter (Signed)
 Copied from CRM #[id number]. Topic: Appointments - Transfer of Care >> Apr 08, 2024  2:40 PM Hadassah PARAS wrote: Pt is requesting to transfer FROM: Norleen Lynwood ORN, MD Pt is requesting to transfer TO:  Boby LITTIE Mackintosh, NP-C Reason for requested transfer: norleen is leaving office It is the responsibility of the team the patient would like to transfer to (Dr.  Boby LITTIE Mackintosh, NP-C) to reach out to the patient if for any reason this transfer is not acceptable.

## 2024-04-13 NOTE — Telephone Encounter (Signed)
 Called and informed pt Christopher Arias is unable to take at this time.   Pt would like message sent to the other providers accepting new patients to see if they will take him on since Dr. Norleen is leaving.

## 2024-04-13 NOTE — Telephone Encounter (Signed)
 LVM for patient

## 2024-04-21 ENCOUNTER — Telehealth (HOSPITAL_COMMUNITY): Payer: Self-pay

## 2024-04-21 NOTE — Telephone Encounter (Signed)
 Attempted f/u call regarding pulmonary rehab- no answer, left message.  Closing referral.

## 2024-04-23 ENCOUNTER — Telehealth (HOSPITAL_COMMUNITY): Payer: Self-pay

## 2024-04-23 NOTE — Telephone Encounter (Signed)
 LVM for pt to schedule for [facility]

## 2024-04-23 NOTE — Telephone Encounter (Signed)
 Pt insurance is active and benefits verified through [employer]. Co-pay $0, DED $1,500/$1,500 met, out of pocket $7,500/$2,913.37 met, co-insurance 25%. No pre-authorization required. 04/23/2024 @ 9:31am, spoke with [place], REF# [id number].  Patient's benefits reset on 06/03/2024.

## 2024-05-07 ENCOUNTER — Telehealth (HOSPITAL_COMMUNITY): Payer: Self-pay

## 2024-05-07 NOTE — Telephone Encounter (Signed)
 Attempted f/u call to schedule pulmonary rehab- no answer, left message.  Closing referral.

## 2024-05-13 DIAGNOSIS — J449 Chronic obstructive pulmonary disease, unspecified: Secondary | ICD-10-CM | POA: Diagnosis not present

## 2024-05-14 ENCOUNTER — Ambulatory Visit: Payer: Self-pay

## 2024-05-14 DIAGNOSIS — I442 Atrioventricular block, complete: Secondary | ICD-10-CM

## 2024-05-15 LAB — CUP PACEART REMOTE DEVICE CHECK
Battery Remaining Longevity: 165 mo
Battery Voltage: 3.06 V
Brady Statistic AP VP Percent: 4.48 %
Brady Statistic AP VS Percent: 1.08 %
Brady Statistic AS VP Percent: 0.49 %
Brady Statistic AS VS Percent: 93.95 %
Brady Statistic RA Percent Paced: 5.66 %
Brady Statistic RV Percent Paced: 4.97 %
Date Time Interrogation Session: 20251212024213
Implantable Lead Connection Status: 753985
Implantable Lead Connection Status: 753985
Implantable Lead Implant Date: 20240611
Implantable Lead Implant Date: 20240611
Implantable Lead Location: 753859
Implantable Lead Location: 753860
Implantable Lead Model: 3830
Implantable Lead Model: 5076
Implantable Pulse Generator Implant Date: 20240611
Lead Channel Impedance Value: 361 Ohm
Lead Channel Impedance Value: 418 Ohm
Lead Channel Impedance Value: 532 Ohm
Lead Channel Impedance Value: 570 Ohm
Lead Channel Pacing Threshold Amplitude: 0.875 V
Lead Channel Pacing Threshold Amplitude: 0.875 V
Lead Channel Pacing Threshold Pulse Width: 0.4 ms
Lead Channel Pacing Threshold Pulse Width: 0.4 ms
Lead Channel Sensing Intrinsic Amplitude: 3.25 mV
Lead Channel Sensing Intrinsic Amplitude: 3.25 mV
Lead Channel Sensing Intrinsic Amplitude: 6 mV
Lead Channel Sensing Intrinsic Amplitude: 6 mV
Lead Channel Setting Pacing Amplitude: 1.5 V
Lead Channel Setting Pacing Amplitude: 2 V
Lead Channel Setting Pacing Pulse Width: 0.4 ms
Lead Channel Setting Sensing Sensitivity: 1.2 mV
Zone Setting Status: 755011

## 2024-05-17 ENCOUNTER — Ambulatory Visit: Payer: Self-pay | Admitting: Cardiology

## 2024-05-20 ENCOUNTER — Telehealth: Payer: Self-pay | Admitting: *Deleted

## 2024-05-20 ENCOUNTER — Telehealth: Payer: Self-pay

## 2024-05-20 NOTE — Telephone Encounter (Signed)
 ATC patient x1 regarding dates needed for forms ([employer]).  VM full. I sent patient a mychart message.  Forms are located in blue folder in Dr. Correne cabinet in A pod.

## 2024-05-20 NOTE — Progress Notes (Signed)
 Remote PPM Transmission

## 2024-05-20 NOTE — Telephone Encounter (Signed)
 Copied from CRM #[id number]. Topic: Clinical - Medical Advice >> May 17, 2024  4:28 PM Rilla B wrote: Reason for CRM:  Meoshua @ [employer] calling regarding restrictions for patient returning back to work.  Please call [phone] ext [phone]  (States she faxed the office on 05/05/2024)    ----------------------------------------------------------------------- From previous Reason for Contact - Med. Prior Auth: Reason for CRM:   Forms have been printed out, RN will have Provider sign and they will be faxed

## 2024-05-20 NOTE — Telephone Encounter (Signed)
 Forms given to Dr. Desai for completion.

## 2024-06-15 ENCOUNTER — Other Ambulatory Visit: Payer: Self-pay | Admitting: Internal Medicine

## 2024-06-15 DIAGNOSIS — F32A Depression, unspecified: Secondary | ICD-10-CM

## 2024-07-29 ENCOUNTER — Encounter: Admitting: Family Medicine

## 2024-08-02 ENCOUNTER — Ambulatory Visit: Admitting: Adult Health

## 2024-08-05 ENCOUNTER — Ambulatory Visit: Admitting: Adult Health

## 2024-08-05 ENCOUNTER — Encounter: Admitting: Family Medicine

## 2024-08-18 ENCOUNTER — Ambulatory Visit: Admitting: Internal Medicine
